# Patient Record
Sex: Male | Born: 1945 | Race: Black or African American | Hispanic: No | Smoking: Never smoker
Health system: Southern US, Community
[De-identification: ages and names within clinical notes are randomized; demographics above are authoritative.]

## PROBLEM LIST (undated history)

## (undated) ENCOUNTER — Emergency Department: Admission: EM | Payer: Medicare HMO | Source: Home / Self Care

## (undated) DIAGNOSIS — I509 Heart failure, unspecified: Secondary | ICD-10-CM

## (undated) DIAGNOSIS — M1990 Unspecified osteoarthritis, unspecified site: Secondary | ICD-10-CM

## (undated) DIAGNOSIS — I209 Angina pectoris, unspecified: Secondary | ICD-10-CM

## (undated) DIAGNOSIS — Z9289 Personal history of other medical treatment: Secondary | ICD-10-CM

## (undated) DIAGNOSIS — R011 Cardiac murmur, unspecified: Secondary | ICD-10-CM

## (undated) DIAGNOSIS — I700 Atherosclerosis of aorta: Secondary | ICD-10-CM

## (undated) DIAGNOSIS — I10 Essential (primary) hypertension: Secondary | ICD-10-CM

## (undated) DIAGNOSIS — Z972 Presence of dental prosthetic device (complete) (partial): Secondary | ICD-10-CM

## (undated) DIAGNOSIS — K219 Gastro-esophageal reflux disease without esophagitis: Secondary | ICD-10-CM

## (undated) DIAGNOSIS — R0989 Other specified symptoms and signs involving the circulatory and respiratory systems: Secondary | ICD-10-CM

## (undated) DIAGNOSIS — I5030 Unspecified diastolic (congestive) heart failure: Secondary | ICD-10-CM

## (undated) DIAGNOSIS — E7800 Pure hypercholesterolemia, unspecified: Secondary | ICD-10-CM

## (undated) DIAGNOSIS — I2510 Atherosclerotic heart disease of native coronary artery without angina pectoris: Secondary | ICD-10-CM

## (undated) HISTORY — DX: Heart failure, unspecified: I50.9

## (undated) HISTORY — DX: Personal history of other medical treatment: Z92.89

---

## 2008-12-28 ENCOUNTER — Emergency Department: Payer: Self-pay | Admitting: Emergency Medicine

## 2015-08-16 ENCOUNTER — Emergency Department
Admission: EM | Admit: 2015-08-16 | Discharge: 2015-08-16 | Disposition: A | Discharge status: Home / Self Care | Payer: Medicare HMO | Attending: Emergency Medicine | Admitting: Emergency Medicine

## 2015-08-16 DIAGNOSIS — X58XXXA Exposure to other specified factors, initial encounter: Secondary | ICD-10-CM | POA: Diagnosis not present

## 2015-08-16 DIAGNOSIS — S3992XA Unspecified injury of lower back, initial encounter: Secondary | ICD-10-CM | POA: Diagnosis present

## 2015-08-16 DIAGNOSIS — Y9389 Activity, other specified: Secondary | ICD-10-CM | POA: Insufficient documentation

## 2015-08-16 DIAGNOSIS — S39012A Strain of muscle, fascia and tendon of lower back, initial encounter: Secondary | ICD-10-CM | POA: Insufficient documentation

## 2015-08-16 DIAGNOSIS — Y9289 Other specified places as the place of occurrence of the external cause: Secondary | ICD-10-CM | POA: Diagnosis not present

## 2015-08-16 DIAGNOSIS — Y990 Civilian activity done for income or pay: Secondary | ICD-10-CM | POA: Insufficient documentation

## 2015-08-16 MED ORDER — DIAZEPAM 2 MG PO TABS: 2.0000 mg | ORAL_TABLET | Freq: Three times a day (TID) | ORAL | Status: DC | PRN

## 2015-08-16 MED ORDER — NAPROXEN 500 MG PO TBEC: 500.0000 mg | DELAYED_RELEASE_TABLET | Freq: Two times a day (BID) | ORAL | Status: DC

## 2015-08-16 NOTE — ED Provider Notes (Signed)
[facility] Emergency Department Provider Note ____________________________________________  Time seen: 1620  I have reviewed the triage vital signs and the nursing notes.  HISTORY  Chief Complaint  Hip Pain  HPI Dale Nabor. is a 70 y.o. male presents to the ED for evaluation of pain to his right buttocks and hip for the last 2 weeks. He describes exact moment of onset was while he was working out at the [employer]. He describes he was doing weight work on the seated back flexion machine. He describes he had just done 100 reps at 150 pounds, and was about through with the next 50 reps.He noted an immediate twinge to the right low back at the buttocks. He then took a week off of working out, at the advice of one of the trainers there. In that same week he is dosed Aleve at least a couple of times and has applied some heat to the low back area. He does report some improvement in his symptoms but assumed it with a simple muscle strain his symptoms would've resolved by now. He notes pain to the left buttock buttocks that seems to be worse with prolonged standing. He denies any lotion a referral, foot drop, or distal paresthesias. He also denies any interim fall, or accident related to it. He reports his overall discomfort 6/10 in triage.  No past medical history on file.  There are no active problems to display for this patient.  No past surgical history on file.  Current Outpatient Rx  Name  Route  Sig  Dispense  Refill  . diazepam (VALIUM) 2 MG tablet   Oral   Take 1 tablet (2 mg total) by mouth every 8 (eight) hours as needed for muscle spasms.   10 tablet   0   . naproxen (EC NAPROSYN) 500 MG EC tablet   Oral   Take 1 tablet (500 mg total) by mouth 2 (two) times daily with a meal.   30 tablet   0    Allergies Review of patient's allergies indicates no known allergies.  No family history on file.  Social History Social History  Substance Use  Topics  . Smoking status: Not on file  . Smokeless tobacco: Not on file  . Alcohol Use: Not on file   Review of Systems  Constitutional: Negative for fever. Cardiovascular: Negative for chest pain. Respiratory: Negative for shortness of breath. Gastrointestinal: Negative for abdominal pain, vomiting and diarrhea. Genitourinary: Negative for dysuria. Musculoskeletal: Negative for back pain. Right buttocks pain as above. Skin: Negative for rash. Neurological: Negative for headaches, focal weakness or numbness. ____________________________________________  PHYSICAL EXAM:  VITAL SIGNS: ED Triage Vitals  Enc Vitals Group     BP 08/16/15 1503 170/87 mmHg     Pulse Rate 08/16/15 1503 84     Resp 08/16/15 1503 20     Temp 08/16/15 1503 98.2 F (36.8 C)     Temp Source 08/16/15 1503 Oral     SpO2 08/16/15 1503 100 %     Weight 08/16/15 1503 230 lb (104.327 kg)     Height 08/16/15 1503 6' (1.829 m)     Head Cir --      Peak Flow --      Pain Score 08/16/15 1505 6     Pain Loc --      Pain Edu? --      Excl. in GC? --    Constitutional: Alert and oriented. Well appearing and in  no distress. Head: Normocephalic and atraumatic.      Eyes: Conjunctivae are normal. PERRL. Normal extraocular movements      Neck: Supple. No thyromegaly. Hematological/Lymphatic/Immunological: No cervical lymphadenopathy. Cardiovascular: Normal rate, regular rhythm.  Respiratory: Normal respiratory effort. No wheezes/rales/rhonchi. Gastrointestinal: Soft and nontender. No distention. Musculoskeletal: Patient with normal spinal alignment without midline tenderness, spasm, deformity, or step-off. He is with minimal palpation to the musculature over the right iliac crest and right gluteal region. There is no groin pain or hip pointer tenderness. Normal right knee exam distally. Patient with full flexion and extension range of the right hip. He is also noted to have fluid internal and external rotation. He is  found to have a negative straight leg raise on exam. Nontender with normal range of motion in all extremities.  Neurologic:  Cranial nerves II through XII grossly intact. Normal LE DTRs bilaterally. Normal toe dorsiflexion and foot eversion on exam. Normal gait without ataxia. Normal speech and language. No gross focal neurologic deficits are appreciated. Skin:  Skin is warm, dry and intact. No rash noted. Psychiatric: Mood and affect are normal. Patient exhibits appropriate insight and judgment. ____________________________________________  INITIAL IMPRESSION / ASSESSMENT AND PLAN / ED COURSE  Patient with what appears to be a persistent right lumbar sacral strain secondary to mechanical injury. He is to continue with ice and heat therapy and dose the prescription anti-inflammatory and muscle relaxant, as directed. He is discharged with a prescription for EC Naprosyn and Valium to dose as directed. He will follow with his primary care provider for further management of symptoms. ____________________________________________  FINAL CLINICAL IMPRESSION(S) / ED DIAGNOSES  Final diagnoses:  Lumbosacral strain, initial encounter      Lissa Hoard, PA-C 08/16/15 1820  Charlesetta Ivory [place], PA-C 08/16/15 1821  Phineas Semen, MD 08/16/15 2017

## 2015-08-16 NOTE — Discharge Instructions (Signed)
Back Exercises °The following exercises strengthen the muscles that help to support the back. They also help to keep the lower back flexible. Doing these exercises can help to prevent back pain or lessen existing pain. °If you have back pain or discomfort, try doing these exercises 2-3 times each day or as told by your health care provider. When the pain goes away, do them once each day, but increase the number of times that you repeat the steps for each exercise (do more repetitions). If you do not have back pain or discomfort, do these exercises once each day or as told by your health care provider. °EXERCISES °Single Knee to Chest °Repeat these steps 3-5 times for each leg: °1. Lie on your back on a firm bed or the floor with your legs extended. °2. Bring one knee to your chest. Your other leg should stay extended and in contact with the floor. °3. Hold your knee in place by grabbing your knee or thigh. °4. Pull on your knee until you feel a gentle stretch in your lower back. °5. Hold the stretch for 10-30 seconds. °6. Slowly release and straighten your leg. °Pelvic Tilt °Repeat these steps 5-10 times: °1. Lie on your back on a firm bed or the floor with your legs extended. °2. Bend your knees so they are pointing toward the ceiling and your feet are flat on the floor. °3. Tighten your lower abdominal muscles to press your lower back against the floor. This motion will tilt your pelvis so your tailbone points up toward the ceiling instead of pointing to your feet or the floor. °4. With gentle tension and even breathing, hold this position for 5-10 seconds. °Cat-Cow °Repeat these steps until your lower back becomes more flexible: °1. Get into a hands-and-knees position on a firm surface. Keep your hands under your shoulders, and keep your knees under your hips. You may place padding under your knees for comfort. °2. Let your head hang down, and point your tailbone toward the floor so your lower back becomes  rounded like the back of a cat. °3. Hold this position for 5 seconds. °4. Slowly lift your head and point your tailbone up toward the ceiling so your back forms a sagging arch like the back of a cow. °5. Hold this position for 5 seconds. °Press-Ups °Repeat these steps 5-10 times: °1. Lie on your abdomen (face-down) on the floor. °2. Place your palms near your head, about shoulder-width apart. °3. While you keep your back as relaxed as possible and keep your hips on the floor, slowly straighten your arms to raise the top half of your body and lift your shoulders. Do not use your back muscles to raise your upper torso. You may adjust the placement of your hands to make yourself more comfortable. °4. Hold this position for 5 seconds while you keep your back relaxed. °5. Slowly return to lying flat on the floor. °Bridges °Repeat these steps 10 times: °1. Lie on your back on a firm surface. °2. Bend your knees so they are pointing toward the ceiling and your feet are flat on the floor. °3. Tighten your buttocks muscles and lift your buttocks off of the floor until your waist is at almost the same height as your knees. You should feel the muscles working in your buttocks and the back of your thighs. If you do not feel these muscles, slide your feet 1-2 inches farther away from your buttocks. °4. Hold this position for 3-5   seconds. 5. Slowly lower your hips to the starting position, and allow your buttocks muscles to relax completely. If this exercise is too easy, try doing it with your arms crossed over your chest. Abdominal Crunches Repeat these steps 5-10 times: 1. Lie on your back on a firm bed or the floor with your legs extended. 2. Bend your knees so they are pointing toward the ceiling and your feet are flat on the floor. 3. Cross your arms over your chest. 4. Tip your chin slightly toward your chest without bending your neck. 5. Tighten your abdominal muscles and slowly raise your trunk (torso) high  enough to lift your shoulder blades a tiny bit off of the floor. Avoid raising your torso higher than that, because it can put too much stress on your low back and it does not help to strengthen your abdominal muscles. 6. Slowly return to your starting position. Back Lifts Repeat these steps 5-10 times: 1. Lie on your abdomen (face-down) with your arms at your sides, and rest your forehead on the floor. 2. Tighten the muscles in your legs and your buttocks. 3. Slowly lift your chest off of the floor while you keep your hips pressed to the floor. Keep the back of your head in line with the curve in your back. Your eyes should be looking at the floor. 4. Hold this position for 3-5 seconds. 5. Slowly return to your starting position. SEEK MEDICAL CARE IF:  Your back pain or discomfort gets much worse when you do an exercise.  Your back pain or discomfort does not lessen within 2 hours after you exercise. If you have any of these problems, stop doing these exercises right away. Do not do them again unless your health care provider says that you can. SEEK IMMEDIATE MEDICAL CARE IF:  You develop sudden, severe back pain. If this happens, stop doing the exercises right away. Do not do them again unless your health care provider says that you can.   This information is not intended to replace advice given to you by your health care provider. Make sure you discuss any questions you have with your health care provider.   Document Released: 07/21/2004 Document Revised: 03/04/2015 Document Reviewed: 08/07/2014 Elsevier Interactive Patient Education [employer]   Take the prescription meds as directed. Follow-up with Dr. Maryellen Pile for ongoing symptoms.

## 2015-08-16 NOTE — ED Notes (Signed)
Pt states after working out in the gym 1 week ago pain to right hip. Pain made worse after standing for a long time.

## 2016-09-14 ENCOUNTER — Encounter: Payer: Self-pay | Admitting: *Deleted

## 2016-09-15 ENCOUNTER — Encounter: Payer: Self-pay | Admitting: *Deleted

## 2016-09-21 ENCOUNTER — Encounter: Payer: Self-pay | Admitting: General Surgery

## 2016-09-21 ENCOUNTER — Ambulatory Visit (INDEPENDENT_AMBULATORY_CARE_PROVIDER_SITE_OTHER): Payer: Medicare PPO | Admitting: General Surgery

## 2016-09-21 VITALS — BP 152/78 | HR 80 | Resp 12 | Ht 72.0 in | Wt 212.0 lb

## 2016-09-21 DIAGNOSIS — Z1211 Encounter for screening for malignant neoplasm of colon: Secondary | ICD-10-CM

## 2016-09-21 MED ORDER — POLYETHYLENE GLYCOL 3350 17 GM/SCOOP PO POWD: 1.0000 | Freq: Once | ORAL | 0 refills | Status: AC

## 2016-09-21 NOTE — Progress Notes (Signed)
Patient ID: Dale DollarOtis Wilbert Keeler Jr., male   DOB: 11/24/1945, 71 y.o.   MRN: [id number]  Chief Complaint  Patient presents with  . Colonoscopy    HPI Dale DollarOtis Wilbert Flam Jr. is a 71 y.o. male here today for a evaluation of a screening colonoscopy. Patient states no GI problems at this time. Moves his bowels daily.  HPI  Past Medical History:  Diagnosis Date  . Hypertension     No past surgical history on file.  No family history on file.  Social History Social History  Substance Use Topics  . Smoking status: Never Smoker  . Smokeless tobacco: Never Used  . Alcohol use No    No Known Allergies  Current Outpatient Prescriptions  Medication Sig Dispense Refill  . diazepam (VALIUM) 2 MG tablet Take 1 tablet (2 mg total) by mouth every 8 (eight) hours as needed for muscle spasms. 10 tablet 0  . moexipril (UNIVASC) 7.5 MG tablet Take 7.5 mg by mouth at bedtime.    . polyethylene glycol powder (GLYCOLAX/MIRALAX) powder Take 255 g by mouth once. 255 grams one bottle for colonoscopy prep 255 g 0   No current facility-administered medications for this visit.     Review of Systems Review of Systems  Constitutional: Negative.   Respiratory: Negative.   Cardiovascular: Negative.   Gastrointestinal: Negative.     Blood pressure (!) 152/78, pulse 80, resp. rate 12, height 6' (1.829 m), weight 212 lb (96.2 kg).  Physical Exam Physical Exam  Constitutional: He appears well-developed.  Neck: Thyromegaly present.  Cardiovascular: Normal rate and regular rhythm.   Murmur heard.  Systolic murmur is present with a grade of 2/6  Patient reports no exercise intolerance. No shortness of breath or PND.  Pulmonary/Chest: Effort normal and breath sounds normal.       Assessment    Candidate for screening colonoscopy    Plan       Colonoscopy with possible biopsy/polypectomy prn: Information regarding the procedure, including its potential risks and complications (including but  not limited to perforation of the bowel, which may require emergency surgery to repair, and bleeding) was verbally given to the patient. Educational information regarding lower intestinal endoscopy was given to the patient. Written instructions for how to complete the bowel prep using Miralax were provided. The importance of drinking ample fluids to avoid dehydration as a result of the prep emphasized.  Patient has been scheduled for a colon[facility] 11[facility]09-16 [facility].  This information has been scribed by Ples SpecterJessica Qualls CMA.   Earline MayotteByrnett, Barbarita Hutmacher W 09/21/2016, 9:08 PM

## 2016-09-21 NOTE — Patient Instructions (Signed)
Colonoscopy, Adult A colonoscopy is an exam to look at the entire large intestine. During the exam, a lubricated, bendable tube is inserted into the anus and then passed into the rectum, colon, and other parts of the large intestine. A colonoscopy is often done as a part of normal colorectal screening or in response to certain symptoms, such as anemia, persistent diarrhea, abdominal pain, and blood in the stool. The exam can help screen for and diagnose medical problems, including:  Tumors.  Polyps.  Inflammation.  Areas of bleeding. Tell a health care provider about:  Any allergies you have.  All medicines you are taking, including vitamins, herbs, eye drops, creams, and over-the-counter medicines.  Any problems you or family members have had with anesthetic medicines.  Any blood disorders you have.  Any surgeries you have had.  Any medical conditions you have.  Any problems you have had passing stool. What are the risks? Generally, this is a safe procedure. However, problems may occur, including:  Bleeding.  A tear in the intestine.  A reaction to medicines given during the exam.  Infection (rare). What happens before the procedure? Eating and drinking restrictions  Follow instructions from your health care provider about eating and drinking, which may include:  A few days before the procedure - follow a low-fiber diet. Avoid nuts, seeds, dried fruit, raw fruits, and vegetables.  1-3 days before the procedure - follow a clear liquid diet. Drink only clear liquids, such as clear broth or bouillon, black coffee or tea, clear juice, clear soft drinks or sports drinks, gelatin dessert, and popsicles. Avoid any liquids that contain red or purple dye.  On the day of the procedure - do not eat or drink anything during the 2 hours before the procedure, or within the time period that your health care provider recommends. Bowel prep  If you were prescribed an oral bowel prep to  clean out your colon:  Take it as told by your health care provider. Starting the day before your procedure, you will need to drink a large amount of medicated liquid. The liquid will cause you to have multiple loose stools until your stool is almost clear or light green.  If your skin or anus gets irritated from diarrhea, you may use these to relieve the irritation:  Medicated wipes, such as adult wet wipes with aloe and vitamin E.  A skin soothing-product like petroleum jelly.  If you vomit while drinking the bowel prep, take a break for up to 60 minutes and then begin the bowel prep again. If vomiting continues and you cannot take the bowel prep without vomiting, call your health care provider. General instructions   Ask your health care provider about changing or stopping your regular medicines. This is especially important if you are taking diabetes medicines or blood thinners.  Plan to have someone take you home from the hospital or clinic. What happens during the procedure?  An IV tube may be inserted into one of your veins.  You will be given medicine to help you relax (sedative).  To reduce your risk of infection:  Your health care team will wash or sanitize their hands.  Your anal area will be washed with soap.  You will be asked to lie on your side with your knees bent.  Your health care provider will lubricate a long, thin, flexible tube. The tube will have a camera and a light on the end.  The tube will be inserted into your anus.    The tube will be gently eased through your rectum and colon.  Air will be delivered into your colon to keep it open. You may feel some pressure or cramping.  The camera will be used to take images during the procedure.  A small tissue sample may be removed from your body to be examined under a microscope (biopsy). If any potential problems are found, the tissue will be sent to a lab for testing.  If small polyps are found, your health  care provider may remove them and have them checked for cancer cells.  The tube that was inserted into your anus will be slowly removed. The procedure may vary among health care providers and hospitals. What happens after the procedure?  Your blood pressure, heart rate, breathing rate, and blood oxygen level will be monitored until the medicines you were given have worn off.  Do not drive for 24 hours after the exam.  You may have a small amount of blood in your stool.  You may pass gas and have mild abdominal cramping or bloating due to the air that was used to inflate your colon during the exam.  It is up to you to get the results of your procedure. Ask your health care provider, or the department performing the procedure, when your results will be ready. This information is not intended to replace advice given to you by your health care provider. Make sure you discuss any questions you have with your health care provider. Document Released: 06/10/2000 Document Revised: 04/13/2016 Document Reviewed: 08/25/2015 Elsevier Interactive Patient Education  2017 Elsevier Inc.  

## 2016-11-02 ENCOUNTER — Telehealth: Payer: Self-pay | Admitting: *Deleted

## 2016-11-02 NOTE — Telephone Encounter (Signed)
Message for patient to call the office.   Patient is scheduled for a [facility] with Dr. Lemar LivingsByrnett.   We need to make sure patient has not had a change in medications since his last office visit. Also, need to make sure that he has not had a change in his health and has picked up Miralax prescription.

## 2016-11-02 NOTE — Telephone Encounter (Signed)
Patient called the office back to report that he has not had a change in his medications since his last office visit.   He states he has yet to pick up his Miralax prescription but was instructed to do so.   We will proceed with colonoscopy as scheduled.   Patient instructed to call the office should he have further questions.

## 2016-11-09 ENCOUNTER — Ambulatory Visit
Admission: RE | Admit: 2016-11-09 | Discharge: 2016-11-09 | Disposition: A | Discharge status: Home / Self Care | Payer: Medicare PPO | Source: Ambulatory Visit | Attending: General Surgery | Admitting: General Surgery

## 2016-11-09 ENCOUNTER — Ambulatory Visit: Payer: Medicare PPO | Admitting: Anesthesiology

## 2016-11-09 ENCOUNTER — Encounter
Admission: RE | Disposition: A | Discharge status: Home / Self Care | Payer: Self-pay | Source: Ambulatory Visit | Attending: General Surgery

## 2016-11-09 ENCOUNTER — Encounter: Payer: Self-pay | Admitting: *Deleted

## 2016-11-09 DIAGNOSIS — K621 Rectal polyp: Secondary | ICD-10-CM | POA: Insufficient documentation

## 2016-11-09 DIAGNOSIS — Z1211 Encounter for screening for malignant neoplasm of colon: Secondary | ICD-10-CM | POA: Diagnosis not present

## 2016-11-09 DIAGNOSIS — I10 Essential (primary) hypertension: Secondary | ICD-10-CM | POA: Diagnosis not present

## 2016-11-09 DIAGNOSIS — Z79899 Other long term (current) drug therapy: Secondary | ICD-10-CM | POA: Insufficient documentation

## 2016-11-09 HISTORY — PX: COLONOSCOPY WITH PROPOFOL: SHX5780

## 2016-11-09 SURGERY — COLONOSCOPY WITH PROPOFOL
Anesthesia: General

## 2016-11-09 MED ORDER — PROPOFOL 500 MG/50ML IV EMUL
INTRAVENOUS | Status: AC
  Filled 2016-11-09: qty 50

## 2016-11-09 MED ORDER — SODIUM CHLORIDE 0.9 % IV SOLN
INTRAVENOUS | Status: DC
  Administered 2016-11-09: 09:00:00 via INTRAVENOUS

## 2016-11-09 MED ORDER — PROPOFOL 500 MG/50ML IV EMUL
INTRAVENOUS | Status: DC | PRN
  Administered 2016-11-09: 150 ug/kg/min via INTRAVENOUS

## 2016-11-09 MED ORDER — PROPOFOL 10 MG/ML IV BOLUS
INTRAVENOUS | Status: DC | PRN
  Administered 2016-11-09: 80 mg via INTRAVENOUS

## 2016-11-09 NOTE — Anesthesia Post-op Follow-up Note (Cosign Needed)
Anesthesia QCDR form completed.        

## 2016-11-09 NOTE — Anesthesia Preprocedure Evaluation (Signed)
Anesthesia Evaluation  Patient identified by MRN, date of birth, ID band Patient awake    Reviewed: Allergy & Precautions, NPO status , Patient's Chart, lab work & pertinent test results  History of Anesthesia Complications Negative for: history of anesthetic complications  Airway Mallampati: III       Dental  (+) Upper Dentures   Pulmonary neg pulmonary ROS,           Cardiovascular hypertension, Pt. on medications      Neuro/Psych negative neurological ROS     GI/Hepatic negative GI ROS, Neg liver ROS,   Endo/Other  negative endocrine ROS  Renal/GU negative Renal ROS     Musculoskeletal   Abdominal   Peds  Hematology negative hematology ROS (+)   Anesthesia Other Findings   Reproductive/Obstetrics                             Anesthesia Physical Anesthesia Plan  ASA: II  Anesthesia Plan: General   Post-op Pain Management:    Induction: Intravenous  Airway Management Planned: Nasal Cannula  Additional Equipment:   Intra-op Plan:   Post-operative Plan:   Informed Consent: I have reviewed the patients History and Physical, chart, labs and discussed the procedure including the risks, benefits and alternatives for the proposed anesthesia with the patient or authorized representative who has indicated his/her understanding and acceptance.     Plan Discussed with:   Anesthesia Plan Comments:         Anesthesia Quick Evaluation

## 2016-11-09 NOTE — Transfer of Care (Signed)
Immediate Anesthesia Transfer of Care Note  Patient: Dale DollarOtis Wilbert Brooks Jr.  Procedure(s) Performed: Procedure(s): COLONOSCOPY WITH PROPOFOL (N/A)  Patient Location: PACU  Anesthesia Type:General  Level of Consciousness: sedated  Airway & Oxygen Therapy: Patient Spontanous Breathing and Patient connected to nasal cannula oxygen  Post-op Assessment: Report given to RN and Post -op Vital signs reviewed and stable  Post vital signs: Reviewed and stable  Last Vitals:  Vitals:   11/09/16 0834  BP: (!) 161/88  Pulse: 73  Resp: 16  Temp: (!) 35.8 C    Last Pain:  Vitals:   11/09/16 0834  TempSrc: Tympanic         Complications: No apparent anesthesia complications

## 2016-11-09 NOTE — Anesthesia Procedure Notes (Signed)
Performed by: Isabela Nardelli Pre-anesthesia Checklist: Patient identified, Emergency Drugs available, Suction available, Patient being monitored and Timeout performed Patient Re-evaluated:Patient Re-evaluated prior to inductionOxygen Delivery Method: Nasal cannula Preoxygenation: Pre-oxygenation with 100% oxygen Intubation Type: IV induction       

## 2016-11-09 NOTE — Anesthesia Postprocedure Evaluation (Signed)
Anesthesia Post Note  Patient: Dale DollarOtis Wilbert Senkbeil Jr.  Procedure(s) Performed: Procedure(s) (LRB): COLONOSCOPY WITH PROPOFOL (N/A)  Patient location during evaluation: Endoscopy Anesthesia Type: General Level of consciousness: awake and alert Pain management: pain level controlled Vital Signs Assessment: post-procedure vital signs reviewed and stable Respiratory status: spontaneous breathing and respiratory function stable Cardiovascular status: stable Anesthetic complications: no     Last Vitals:  Vitals:   11/09/16 0834 11/09/16 0957  BP: (!) 161/88 112/70  Pulse: 73 71  Resp: 16 13  Temp: (!) 35.8 C     Last Pain:  Vitals:   11/09/16 0834  TempSrc: Tympanic                 KEPHART,WILLIAM K

## 2016-11-09 NOTE — H&P (Signed)
Dale GrammesOtis Wilbert Joneen BoersRankin Jr. [id number] 02/28/1946     HPI: Healthy 71  y/o male for screening colonoscopy. Tolerated prep well.   Prescriptions Prior to Admission  Medication Sig Dispense Refill Last Dose  . diltiazem (TIAZAC) 300 MG 24 hr capsule Take 300 mg by mouth daily.   11/09/2016 at 0600  . fexofenadine (ALLEGRA) 180 MG tablet Take 180 mg by mouth daily as needed for allergies or rhinitis.     Marland Kitchen. moexipril (UNIVASC) 7.5 MG tablet Take 7.5 mg by mouth at bedtime.   11/09/2016 at 0600  . naproxen sodium (ANAPROX) 220 MG tablet Take 220 mg by mouth 2 (two) times daily as needed.      No Known Allergies Past Medical History:  Diagnosis Date  . Hypertension    History reviewed. No pertinent surgical history. Social History   Social History  . Marital status: Divorced    Spouse name: N/A  . Number of children: N/A  . Years of education: N/A   Occupational History  . Not on file.   Social History Main Topics  . Smoking status: Never Smoker  . Smokeless tobacco: Never Used  . Alcohol use No  . Drug use: No  . Sexual activity: Not on file   Other Topics Concern  . Not on file   Social History Narrative  . No narrative on file   Social History   Social History Narrative  . No narrative on file     ROS: Negative.     PE: HEENT: Negative. Lungs: Clear. Cardio: RR.   Assessment/Plan:  Proceed with planned endoscopy.  Earline MayotteByrnett, Jermiya Reichl W 11/09/2016   Assessment/Plan:  Proceed with planned endoscopy.

## 2016-11-09 NOTE — Op Note (Signed)
[facility] Patient Name: Dale Cook Procedure Date: 11/09/2016 9:22 AM MRN: [id number] Account #: [id number] Date of Birth: Oct 29, 1945 Admit Type: Outpatient Age: 71 Room: [facility] ENDO ROOM 1 Gender: Male Note Status: Finalized Procedure:            Colonoscopy Indications:          Screening for colorectal malignant neoplasm Providers:            Earline Mayotte, MD Referring MD:         Serita Sheller. Maryellen Pile MD, MD (Referring MD) Medicines:            Monitored Anesthesia Care Complications:        No immediate complications. Procedure:            Pre-Anesthesia Assessment:                       - Prior to the procedure, a History and Physical was                        performed, and patient medications, allergies and                        sensitivities were reviewed. The patient's tolerance of                        previous anesthesia was reviewed.                       - The risks and benefits of the procedure and the                        sedation options and risks were discussed with the                        patient. All questions were answered and informed                        consent was obtained.                       After obtaining informed consent, the colonoscope was                        passed under direct vision. Throughout the procedure,                        the patient's blood pressure, pulse, and oxygen                        saturations were monitored continuously. The                        Colonoscope was introduced through the anus and                        advanced to the the cecum, identified by appendiceal                        orifice and ileocecal valve. The colonoscopy was  performed without difficulty. The patient tolerated the                        procedure well. The quality of the bowel preparation                        was excellent. Findings:      A 5 mm polyp was found in the  rectum. The polyp was sessile. This was       biopsied with a cold forceps for histology.      The retroflexed view of the distal rectum and anal verge was normal and       showed no anal or rectal abnormalities. Impression:           - One 5 mm polyp in the rectum. Biopsied.                       - The distal rectum and anal verge are normal on                        retroflexion view. Recommendation:       - Telephone endoscopist for pathology results in 1 week. Procedure Code(s):    --- Professional ---                       [phone], Colonoscopy, flexible; with biopsy, single or                        multiple Diagnosis Code(s):    --- Professional ---                       Z12.11, Encounter for screening for malignant neoplasm                        of colon                       K62.1, Rectal polyp CPT copyright 2016 American Medical Association. All rights reserved. The codes documented in this report are preliminary and upon coder review may  be revised to meet current compliance requirements. Earline MayotteJeffrey W. Ciria Bernardini, MD 11/09/2016 9:54:17 AM This report has been signed electronically. Number of Addenda: 0 Note Initiated On: 11/09/2016 9:22 AM Scope Withdrawal Time: 0 hours 8 minutes 43 seconds  Total Procedure Duration: 0 hours 25 minutes 35 seconds       [facility]

## 2016-11-10 ENCOUNTER — Encounter: Payer: Self-pay | Admitting: General Surgery

## 2016-11-10 ENCOUNTER — Telehealth: Payer: Self-pay | Admitting: General Surgery

## 2016-11-10 LAB — SURGICAL PATHOLOGY

## 2016-11-10 NOTE — Telephone Encounter (Signed)
Message left that biopsy was fine, but warrants a three year follow up.

## 2016-11-11 ENCOUNTER — Telehealth: Payer: Self-pay

## 2016-11-11 NOTE — Telephone Encounter (Signed)
error 

## 2017-01-05 ENCOUNTER — Ambulatory Visit (INDEPENDENT_AMBULATORY_CARE_PROVIDER_SITE_OTHER): Payer: Medicare PPO | Admitting: Cardiovascular Disease

## 2017-01-05 ENCOUNTER — Ambulatory Visit
Admission: RE | Admit: 2017-01-05 | Discharge: 2017-01-05 | Disposition: A | Discharge status: Home / Self Care | Payer: Medicare PPO | Source: Ambulatory Visit | Attending: Cardiovascular Disease | Admitting: Cardiovascular Disease

## 2017-01-05 ENCOUNTER — Encounter: Payer: Self-pay | Admitting: Cardiovascular Disease

## 2017-01-05 ENCOUNTER — Other Ambulatory Visit
Admission: RE | Admit: 2017-01-05 | Discharge: 2017-01-05 | Disposition: A | Discharge status: Home / Self Care | Payer: Medicare PPO | Source: Ambulatory Visit | Attending: Cardiovascular Disease | Admitting: Cardiovascular Disease

## 2017-01-05 VITALS — BP 150/78 | HR 85 | Ht 72.0 in | Wt 219.5 lb

## 2017-01-05 DIAGNOSIS — Z7689 Persons encountering health services in other specified circumstances: Secondary | ICD-10-CM

## 2017-01-05 DIAGNOSIS — E785 Hyperlipidemia, unspecified: Secondary | ICD-10-CM | POA: Diagnosis not present

## 2017-01-05 DIAGNOSIS — Z01818 Encounter for other preprocedural examination: Secondary | ICD-10-CM | POA: Insufficient documentation

## 2017-01-05 DIAGNOSIS — Z01812 Encounter for preprocedural laboratory examination: Secondary | ICD-10-CM

## 2017-01-05 DIAGNOSIS — R0989 Other specified symptoms and signs involving the circulatory and respiratory systems: Secondary | ICD-10-CM | POA: Diagnosis not present

## 2017-01-05 DIAGNOSIS — I200 Unstable angina: Secondary | ICD-10-CM

## 2017-01-05 LAB — BASIC METABOLIC PANEL
Anion gap: 10 (ref 5–15)
BUN: 32 mg/dL — ABNORMAL HIGH (ref 6–20)
CALCIUM: 9.4 mg/dL (ref 8.9–10.3)
CHLORIDE: 102 mmol/L (ref 101–111)
CO2: 25 mmol/L (ref 22–32)
CREATININE: 1.41 mg/dL — AB (ref 0.61–1.24)
GFR calc non Af Amer: 50 mL/min — ABNORMAL LOW (ref >60)
GFR, EST AFRICAN AMERICAN: 58 mL/min — AB (ref >60)
Glucose, Bld: 142 mg/dL — ABNORMAL HIGH (ref 65–99)
Potassium: 4.6 mmol/L (ref 3.5–5.1)
SODIUM: 137 mmol/L (ref 135–145)

## 2017-01-05 LAB — CBC
HCT: 50.2 % (ref 40.0–52.0)
Hemoglobin: 16.5 g/dL (ref 13.0–18.0)
MCH: 28.3 pg (ref 26.0–34.0)
MCHC: 32.9 g/dL (ref 32.0–36.0)
MCV: 85.9 fL (ref 80.0–100.0)
PLATELETS: 210 10*3/uL (ref 150–440)
RBC: 5.85 MIL/uL (ref 4.40–5.90)
RDW: 14.7 % — ABNORMAL HIGH (ref 11.5–14.5)
WBC: 21.4 10*3/uL — AB (ref 3.8–10.6)

## 2017-01-05 LAB — PROTIME-INR
INR: 1.03
PROTHROMBIN TIME: 13.5 s (ref 11.4–15.2)

## 2017-01-05 MED ORDER — ATORVASTATIN CALCIUM 40 MG PO TABS: 40.0000 mg | ORAL_TABLET | Freq: Every day | ORAL | 3 refills | Status: DC

## 2017-01-05 MED ORDER — ASPIRIN EC 81 MG PO TBEC: 81.0000 mg | DELAYED_RELEASE_TABLET | Freq: Every day | ORAL | 3 refills | Status: DC

## 2017-01-05 NOTE — Patient Instructions (Addendum)
Medication Instructions:  Your physician has recommended you make the following change in your medication:  START taking aspirin 81mg   Daily (over the counter) START taking atorvastatin 40mg  once daily   Labwork: BMET, CBC, PT/INR [facility] in the [facility]  Testing/Procedures: A chest x-ray takes a picture of the organs and structures inside the chest, including the heart, lungs, and blood vessels. This test can show several things, including, whether the heart is enlarges; whether fluid is building up in the lungs; and whether pacemaker / defibrillator leads are still in place.  Your physician has requested that you have a cardiac catheterization. Cardiac catheterization is used to diagnose and/or treat various heart conditions. Doctors may recommend this procedure for a number of different reasons. The most common reason is to evaluate chest pain. Chest pain can be a symptom of coronary artery disease (CAD), and cardiac catheterization can show whether plaque is narrowing or blocking your heart's arteries. This procedure is also used to evaluate the valves, as well as measure the blood flow and oxygen levels in different parts of your heart. For further information please visit [email or web address] Please follow instruction sheet, as given.  [facility] Cardiac Cath Instructions   You are scheduled for a Cardiac Cath on: Monday, July 16  Please arrive at 8:30am on the day of your procedure  Please expect a call from our [facility] to pre-register you  Do not eat/drink anything after midnight  Someone will need to drive you home  It is recommended someone be with you for the first 24 hours after your procedure  Wear clothes that are easy to get on/off and wear slip on shoes if possible   Medications bring a current list of all medications with you  __xx_ You may take all of your medications the morning of your procedure with enough water to swallow  safely  Day of your procedure: Arrive at the [facility] entrance.  Free valet service is available.  After entering the [facility] please check-in at the registration desk (1st desk on your right) to receive your armband. After receiving your armband someone will escort you to the cardiac cath/special procedures waiting area.  The usual length of stay after your procedure is about 2 to 3 hours.  This can vary.  If you have any questions, please call our office at [phone], or you may call the cardiac cath lab at [facility] directly at [phone]   Follow-Up: Your physician recommends that you schedule a follow-up appointment in: one month with Dr. Kirke Corin.    Any Other Special Instructions Will Be Listed Below (If Applicable).     If you need a refill on your cardiac medications before your next appointment, please call your pharmacy.   Angiogram An angiogram is an X-ray test. It is used to look at your blood vessels. For this test, a dye is put into the blood vessel being checked. The dye shows up on X-rays. It helps your doctor see if there is a blockage or other problem in the blood vessel. What happens before the procedure?  Follow your doctor's instructions about limiting what you eat or drink.  Ask your doctor if you may drink enough water to take any needed medicines the morning of the test.  Plan to have someone take you home after the test.  If you go home the same day as the test, plan to have someone stay with you for 24 hours. What happens during the  procedure?  An IV tube will be put into one of your veins.  You will be given a medicine that makes you relax (sedative).  Your skin will be washed where the thin tube (catheter) will be put in. Hair may be removed from this area. The tube may be put into: ? Your upper leg area (groin). ? The fold of your arm, near your elbow. ? Your wrist.  You will be given a medicine that numbs the area where the tube will be  inserted (local anesthetic).  The tube will be inserted into a blood vessel.  Using a type of X-ray (fluoroscopy) to see, your doctor will move the tube into the blood vessel to check it.  Dye will be put in through the tube. X-rays of your blood vessels will then be taken. Different doctors and hospitals may do this procedure differently. What happens after the procedure?  If the test is done through the leg, you will be kept in bed lying flat for several hours. You will be told to not bend or cross your legs.  The area where the tube was inserted will be checked often.  The pulse in your feet or wrist will be checked often.  More tests or X-rays may be done. This information is not intended to replace advice given to you by your health care provider. Make sure you discuss any questions you have with your health care provider. Document Released: 09/09/2008 Document Revised: 11/19/2015 Document Reviewed: 11/14/2012 Elsevier Interactive Patient Education  2017 Elsevier Inc.  Angiogram, Care After This sheet gives you information about how to care for yourself after your procedure. Your health care provider may also give you more specific instructions. If you have problems or questions, contact your health care provider. What can I expect after the procedure? After the procedure, it is common to have bruising and tenderness at the catheter insertion area. Follow these instructions at home: Insertion site care  Follow instructions from your health care provider about how to take care of your insertion site. Make sure you: ? Wash your hands with soap and water before you change your bandage (dressing). If soap and water are not available, use hand sanitizer. ? Change your dressing as told by your health care provider. ? Leave stitches (sutures), skin glue, or adhesive strips in place. These skin closures may need to stay in place for 2 weeks or longer. If adhesive strip edges start to  loosen and curl up, you may trim the loose edges. Do not remove adhesive strips completely unless your health care provider tells you to do that.  Do not take baths, swim, or use a hot tub until your health care provider approves.  You may shower 24-48 hours after the procedure or as told by your health care provider. ? Gently wash the site with plain soap and water. ? Pat the area dry with a clean towel. ? Do not rub the site. This may cause bleeding.  Do not apply powder or lotion to the site. Keep the site clean and dry.  Check your insertion site every day for signs of infection. Check for: ? Redness, swelling, or pain. ? Fluid or blood. ? Warmth. ? Pus or a bad smell. Activity  Rest as told by your health care provider, usually for 1-2 days.  Do not lift anything that is heavier than 10 lbs. (4.5 kg) or as told by your health care provider.  Do not drive for 24 hours if  you were given a medicine to help you relax (sedative).  Do not drive or use heavy machinery while taking prescription pain medicine. General instructions  Return to your normal activities as told by your health care provider, usually in about a week. Ask your health care provider what activities are safe for you.  If the catheter site starts bleeding, lie flat and put pressure on the site. If the bleeding does not stop, get help right away. This is a medical emergency.  Drink enough fluid to keep your urine clear or pale yellow. This helps flush the contrast dye from your body.  Take over-the-counter and prescription medicines only as told by your health care provider.  Keep all follow-up visits as told by your health care provider. This is important. Contact a health care provider if:  You have a fever or chills.  You have redness, swelling, or pain around your insertion site.  You have fluid or blood coming from your insertion site.  The insertion site feels warm to the touch.  You have pus or a  bad smell coming from your insertion site.  You have bruising around the insertion site.  You notice blood collecting in the tissue around the catheter site (hematoma). The hematoma may be painful to the touch. Get help right away if:  You have severe pain at the catheter insertion area.  The catheter insertion area swells very fast.  The catheter insertion area is bleeding, and the bleeding does not stop when you hold steady pressure on the area.  The area near or just beyond the catheter insertion site becomes pale, cool, tingly, or numb. These symptoms may represent a serious problem that is an emergency. Do not wait to see if the symptoms will go away. Get medical help right away. Call your local emergency services (911 in the [place] Do not drive yourself to the hospital. Summary  After the procedure, it is common to have bruising and tenderness at the catheter insertion area.  After the procedure, it is important to rest and drink plenty of fluids.  Do not take baths, swim, or use a hot tub until your health care provider says it is okay to do so. You may shower 24-48 hours after the procedure or as told by your health care provider.  If the catheter site starts bleeding, lie flat and put pressure on the site. If the bleeding does not stop, get help right away. This is a medical emergency. This information is not intended to replace advice given to you by your health care provider. Make sure you discuss any questions you have with your health care provider. Document Released: 12/30/2004 Document Revised: 05/18/2016 Document Reviewed: 05/18/2016 Elsevier Interactive Patient Education  2017 [employer].

## 2017-01-05 NOTE — Progress Notes (Signed)
Cardiology Office Note   Date:  01/05/2017   ID:  Dale Grammestis Wilbert Joneen BoersRankin Jr., DOB 08/12/1945, MRN [id number]  PCP:  Dale KaplanEason, Ernest B, MD  Cardiologist:   Dale BearsMuhammad Arida, MD   Chief Complaint  Patient presents with  . other    Per Dr. Maryellen PileEason c/o chest pain and elevated BP. Meds reviewed verbally with pt.      History of Present Illness: Dale DollarOtis Wilbert Sitzmann Jr. is a 71 y.o. male who Was referred by Dr. Maryellen PileEason for  urgent evaluation of chest pain. He has no prior cardiac history and has prolonged history of hypertension. He is not a smoker and has no family history of coronary artery disease. 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 Over the last month, he has experienced intermittent substernal chest tightness without radiation. This is mainly happening with physical activities and it started as brief in duration lasting for a few minutes but gradually has increased with prolonged episodes up to 10-15 minutes. Typically the discomfort resolves if he rest for about 5 or 10 minutes. This has been associated with shortness of breath but no orthopnea or PND. No similar symptoms and no previous cardiac evaluation.   Past Medical History:  Diagnosis Date  . Hypertension     Past Surgical History:  Procedure Laterality Date  . COLONOSCOPY WITH PROPOFOL N/A 11/09/2016   Procedure: COLONOSCOPY WITH PROPOFOL;  Surgeon: Earline MayotteByrnett, Jeffrey W, MD;  Location: [facility] ENDOSCOPY;  Service: Endoscopy;  Laterality: N/A;     Current Outpatient Prescriptions  Medication Sig Dispense Refill  . diltiazem (TIAZAC) 300 MG 24 hr capsule Take 300 mg by mouth daily.    . fexofenadine (ALLEGRA) 180 MG tablet Take 180 mg by mouth daily as needed for allergies or rhinitis.    Marland Kitchen. moexipril (UNIVASC) 7.5 MG tablet Take 7.5 mg by mouth at bedtime.    . naproxen sodium (ANAPROX) 220 MG tablet Take 220 mg by mouth 2 (two) times daily as needed.     No current facility-administered medications for this visit.      Allergies:   Patient has no known allergies.    Social History:  The patient  reports that he has never smoked. He has never used smokeless tobacco. He reports that he does not drink alcohol or use drugs.   Family History:  The patient's family history includes Heart attack in his mother.    ROS:  Please see the history of present illness.   Otherwise, review of systems are positive for none.   All other systems are reviewed and negative.    PHYSICAL EXAM: VS:  BP (!) 150/78 (BP Location: Right Arm, Patient Position: Sitting, Cuff Size: Normal)   Pulse 85   Ht 6' (1.829 m)   Wt 219 lb 8 oz (99.6 kg)   BMI 29.77 kg/m  , BMI Body mass index is 29.77 kg/m. GEN: Well nourished, well developed, in no acute distress  HEENT: normal  Neck: no JVD,  or masses. Left carotid bruit Cardiac: RRR; no  rubs, or gallops,no edema .  2/6 systolic ejection murmur in the aortic area which is early peaking Respiratory:  clear to auscultation bilaterally, normal work of breathing GI: soft, nontender, nondistended, + BS MS: no deformity or atrophy  Skin: warm and dry, no rash Neuro:  Strength and sensation are intact Psych: euthymic mood, full affect   EKG:  EKG is ordered today. The ekg ordered today demonstrates normal sinus rhythm with possible old inferior  infarct.   Recent Labs: No results found for requested labs within last 8760 hours.    Lipid Panel No results found for: CHOL, TRIG, HDL, CHOLHDL, VLDL, LDLCALC, LDLDIRECT    Wt Readings from Last 3 Encounters:  01/05/17 219 lb 8 oz (99.6 kg)  11/09/16 221 lb (100.2 kg)  09/21/16 212 lb (96.2 kg)      PAD Screen 01/05/2017  Previous PAD dx? No  Previous surgical procedure? No  Pain with walking? Yes  Subsides with rest? Yes  Feet/toe relief with dangling? No  Painful, non-healing ulcers? No  Extremities discolored? No      ASSESSMENT AND PLAN:  1.  Unstable angina: The patient's symptoms are highly worrisome for  unstable angina. I advised him to start taking aspirin 81 mg once daily and atorvastatin 40 mg once daily. I recommend proceeding with urgent cardiac catheterization and possible coronary intervention. I discussed risks, benefits and alternatives. I strongly advised him to call EMS if he develops chest pain at rest or non-resolving exertional chest pain.  2. Systolic murmur suggestive of aortic sclerosis or mild stenosis: This can be evaluated at same time of cardiac catheterization.  3. Left carotid bruit: I requested carotid Doppler.  4. Hyperlipidemia: I started atorvastatin 40 mg daily.    Disposition:   FU with me in 1 month  Signed,  Dale Bears, MD  01/05/2017 2:44 PM    [facility]

## 2017-01-06 ENCOUNTER — Other Ambulatory Visit: Payer: Self-pay

## 2017-01-06 DIAGNOSIS — R0989 Other specified symptoms and signs involving the circulatory and respiratory systems: Secondary | ICD-10-CM

## 2017-01-09 ENCOUNTER — Other Ambulatory Visit: Payer: Self-pay | Admitting: Nurse Practitioner

## 2017-01-09 ENCOUNTER — Inpatient Hospital Stay
Admission: AD | Admit: 2017-01-09 | Discharge: 2017-01-09 | DRG: 287 | Disposition: A | Discharge status: Other Acute Inpatient Hospital | Payer: Medicare PPO | Source: Ambulatory Visit | Attending: Cardiovascular Disease | Admitting: Cardiovascular Disease

## 2017-01-09 ENCOUNTER — Encounter (HOSPITAL_COMMUNITY): Payer: Self-pay | Admitting: General Practice

## 2017-01-09 ENCOUNTER — Encounter
Admission: AD | Disposition: A | Discharge status: Other Acute Inpatient Hospital | Payer: Self-pay | Source: Ambulatory Visit | Attending: Cardiovascular Disease

## 2017-01-09 ENCOUNTER — Encounter: Payer: Self-pay | Admitting: *Deleted

## 2017-01-09 ENCOUNTER — Inpatient Hospital Stay (HOSPITAL_COMMUNITY)
Admission: AD | Admit: 2017-01-09 | Discharge: 2017-01-17 | DRG: 236 | Disposition: A | Discharge status: Home Health | Payer: Medicare PPO | Source: Other Acute Inpatient Hospital | Attending: Thoracic Surgery (Cardiothoracic Vascular Surgery) | Admitting: Thoracic Surgery (Cardiothoracic Vascular Surgery)

## 2017-01-09 DIAGNOSIS — M19042 Primary osteoarthritis, left hand: Secondary | ICD-10-CM | POA: Diagnosis present

## 2017-01-09 DIAGNOSIS — I10 Essential (primary) hypertension: Secondary | ICD-10-CM | POA: Diagnosis present

## 2017-01-09 DIAGNOSIS — D62 Acute posthemorrhagic anemia: Secondary | ICD-10-CM | POA: Diagnosis not present

## 2017-01-09 DIAGNOSIS — I25110 Atherosclerotic heart disease of native coronary artery with unstable angina pectoris: Secondary | ICD-10-CM | POA: Diagnosis present

## 2017-01-09 DIAGNOSIS — Z79899 Other long term (current) drug therapy: Secondary | ICD-10-CM

## 2017-01-09 DIAGNOSIS — J9811 Atelectasis: Secondary | ICD-10-CM | POA: Diagnosis not present

## 2017-01-09 DIAGNOSIS — M19041 Primary osteoarthritis, right hand: Secondary | ICD-10-CM | POA: Diagnosis present

## 2017-01-09 DIAGNOSIS — I200 Unstable angina: Secondary | ICD-10-CM | POA: Diagnosis present

## 2017-01-09 DIAGNOSIS — I2510 Atherosclerotic heart disease of native coronary artery without angina pectoris: Secondary | ICD-10-CM

## 2017-01-09 DIAGNOSIS — R0789 Other chest pain: Secondary | ICD-10-CM | POA: Diagnosis not present

## 2017-01-09 DIAGNOSIS — E8770 Fluid overload, unspecified: Secondary | ICD-10-CM | POA: Diagnosis not present

## 2017-01-09 DIAGNOSIS — E785 Hyperlipidemia, unspecified: Secondary | ICD-10-CM | POA: Diagnosis present

## 2017-01-09 DIAGNOSIS — Z8249 Family history of ischemic heart disease and other diseases of the circulatory system: Secondary | ICD-10-CM

## 2017-01-09 DIAGNOSIS — M170 Bilateral primary osteoarthritis of knee: Secondary | ICD-10-CM | POA: Diagnosis present

## 2017-01-09 DIAGNOSIS — R7303 Prediabetes: Secondary | ICD-10-CM | POA: Diagnosis present

## 2017-01-09 DIAGNOSIS — I25119 Atherosclerotic heart disease of native coronary artery with unspecified angina pectoris: Secondary | ICD-10-CM | POA: Diagnosis present

## 2017-01-09 DIAGNOSIS — R011 Cardiac murmur, unspecified: Secondary | ICD-10-CM | POA: Diagnosis present

## 2017-01-09 DIAGNOSIS — Z951 Presence of aortocoronary bypass graft: Secondary | ICD-10-CM | POA: Diagnosis not present

## 2017-01-09 DIAGNOSIS — E782 Mixed hyperlipidemia: Secondary | ICD-10-CM | POA: Diagnosis present

## 2017-01-09 DIAGNOSIS — N184 Chronic kidney disease, stage 4 (severe): Secondary | ICD-10-CM | POA: Diagnosis not present

## 2017-01-09 DIAGNOSIS — Z01810 Encounter for preprocedural cardiovascular examination: Secondary | ICD-10-CM | POA: Diagnosis not present

## 2017-01-09 HISTORY — DX: Pure hypercholesterolemia, unspecified: E78.00

## 2017-01-09 HISTORY — DX: Atherosclerotic heart disease of native coronary artery without angina pectoris: I25.10

## 2017-01-09 HISTORY — DX: Essential (primary) hypertension: I10

## 2017-01-09 HISTORY — DX: Unspecified osteoarthritis, unspecified site: M19.90

## 2017-01-09 HISTORY — PX: LEFT HEART CATH AND CORONARY ANGIOGRAPHY: CATH118249

## 2017-01-09 HISTORY — PX: CARDIAC CATHETERIZATION: SHX172

## 2017-01-09 LAB — CBC
HCT: 49.4 % (ref 40.0–52.0)
HEMOGLOBIN: 16.7 g/dL (ref 13.0–18.0)
MCH: 28.9 pg (ref 26.0–34.0)
MCHC: 33.9 g/dL (ref 32.0–36.0)
MCV: 85.2 fL (ref 80.0–100.0)
Platelets: 209 10*3/uL (ref 150–440)
RBC: 5.79 MIL/uL (ref 4.40–5.90)
RDW: 14.4 % (ref 11.5–14.5)
WBC: 10.9 10*3/uL — AB (ref 3.8–10.6)

## 2017-01-09 LAB — APTT: APTT: 29 s (ref 24–36)

## 2017-01-09 LAB — PROTIME-INR
INR: 1.04
Prothrombin Time: 13.6 seconds (ref 11.4–15.2)

## 2017-01-09 SURGERY — LEFT HEART CATH AND CORONARY ANGIOGRAPHY
Anesthesia: Moderate Sedation | Laterality: Left

## 2017-01-09 MED ORDER — NITROGLYCERIN 0.4 MG SL SUBL: 0.4000 mg | SUBLINGUAL_TABLET | SUBLINGUAL | Status: DC | PRN

## 2017-01-09 MED ORDER — VERAPAMIL HCL 2.5 MG/ML IV SOLN
INTRAVENOUS | Status: DC | PRN
Start: 2017-01-09 — End: 2017-01-09
  Administered 2017-01-09: 2.5 mg via INTRA_ARTERIAL

## 2017-01-09 MED ORDER — ASPIRIN EC 81 MG PO TBEC
81.0000 mg | DELAYED_RELEASE_TABLET | Freq: Every day | ORAL | Status: DC
  Filled 2017-01-09: qty 1

## 2017-01-09 MED ORDER — SODIUM CHLORIDE 0.9% FLUSH: 3.0000 mL | Freq: Two times a day (BID) | INTRAVENOUS | Status: DC

## 2017-01-09 MED ORDER — ATORVASTATIN CALCIUM 20 MG PO TABS
40.0000 mg | ORAL_TABLET | Freq: Every day | ORAL | Status: DC
  Filled 2017-01-09: qty 1

## 2017-01-09 MED ORDER — SODIUM CHLORIDE 0.9 % IV SOLN: 250.0000 mL | INTRAVENOUS | Status: DC | PRN

## 2017-01-09 MED ORDER — IOPAMIDOL (ISOVUE-300) INJECTION 61%
INTRAVENOUS | Status: DC | PRN
  Administered 2017-01-09: 75 mL via INTRAVENOUS

## 2017-01-09 MED ORDER — FENTANYL CITRATE (PF) 100 MCG/2ML IJ SOLN
INTRAMUSCULAR | Status: DC | PRN
  Administered 2017-01-09: 25 ug via INTRAVENOUS

## 2017-01-09 MED ORDER — SODIUM CHLORIDE 0.9% FLUSH: 3.0000 mL | INTRAVENOUS | Status: DC | PRN

## 2017-01-09 MED ORDER — ONDANSETRON HCL 4 MG/2ML IJ SOLN: 4.0000 mg | Freq: Four times a day (QID) | INTRAMUSCULAR | Status: DC | PRN

## 2017-01-09 MED ORDER — HEPARIN SODIUM (PORCINE) 1000 UNIT/ML IJ SOLN
INTRAMUSCULAR | Status: DC | PRN
  Administered 2017-01-09: 5000 [IU] via INTRAVENOUS

## 2017-01-09 MED ORDER — METOPROLOL TARTRATE 50 MG PO TABS
50.0000 mg | ORAL_TABLET | Freq: Two times a day (BID) | ORAL | Status: DC
  Administered 2017-01-09 – 2017-01-11 (×5): 50 mg via ORAL
  Filled 2017-01-09 (×5): qty 1

## 2017-01-09 MED ORDER — MIDAZOLAM HCL 2 MG/2ML IJ SOLN
INTRAMUSCULAR | Status: DC | PRN
  Administered 2017-01-09: 1 mg via INTRAVENOUS

## 2017-01-09 MED ORDER — METOPROLOL SUCCINATE ER 50 MG PO TB24
ORAL_TABLET | ORAL | Status: AC
  Filled 2017-01-09: qty 1

## 2017-01-09 MED ORDER — NAPROXEN SODIUM 550 MG PO TABS
550.0000 mg | ORAL_TABLET | Freq: Every day | ORAL | Status: DC | PRN
  Filled 2017-01-09: qty 1

## 2017-01-09 MED ORDER — HEPARIN (PORCINE) IN NACL 2-0.9 UNIT/ML-% IJ SOLN
INTRAMUSCULAR | Status: AC
Start: 2017-01-09 — End: 2017-01-09
  Filled 2017-01-09: qty 500

## 2017-01-09 MED ORDER — ASPIRIN 81 MG PO CHEW: 81.0000 mg | CHEWABLE_TABLET | ORAL | Status: DC

## 2017-01-09 MED ORDER — METOPROLOL TARTRATE 50 MG PO TABS
50.0000 mg | ORAL_TABLET | Freq: Two times a day (BID) | ORAL | Status: DC
  Administered 2017-01-09: 50 mg via ORAL
  Filled 2017-01-09 (×3): qty 1

## 2017-01-09 MED ORDER — HEPARIN SODIUM (PORCINE) 1000 UNIT/ML IJ SOLN
INTRAMUSCULAR | Status: AC
  Filled 2017-01-09: qty 1

## 2017-01-09 MED ORDER — ACETAMINOPHEN 325 MG PO TABS: 650.0000 mg | ORAL_TABLET | ORAL | Status: DC | PRN

## 2017-01-09 MED ORDER — MIDAZOLAM HCL 2 MG/2ML IJ SOLN
INTRAMUSCULAR | Status: AC
  Filled 2017-01-09: qty 2

## 2017-01-09 MED ORDER — SODIUM CHLORIDE 0.9 % IV SOLN
INTRAVENOUS | Status: DC
  Administered 2017-01-09: 09:00:00 via INTRAVENOUS

## 2017-01-09 MED ORDER — ATORVASTATIN CALCIUM 40 MG PO TABS
40.0000 mg | ORAL_TABLET | Freq: Every day | ORAL | Status: DC
  Administered 2017-01-10 – 2017-01-11 (×2): 40 mg via ORAL
  Filled 2017-01-09 (×2): qty 1

## 2017-01-09 MED ORDER — FENTANYL CITRATE (PF) 100 MCG/2ML IJ SOLN
INTRAMUSCULAR | Status: AC
  Filled 2017-01-09: qty 2

## 2017-01-09 MED ORDER — SODIUM CHLORIDE 0.9% FLUSH
3.0000 mL | Freq: Two times a day (BID) | INTRAVENOUS | Status: DC
  Administered 2017-01-11: 3 mL via INTRAVENOUS

## 2017-01-09 MED ORDER — HEPARIN (PORCINE) IN NACL 100-0.45 UNIT/ML-% IJ SOLN
1400.0000 [IU]/h | INTRAMUSCULAR | Status: DC
  Administered 2017-01-09: 1350 [IU]/h via INTRAVENOUS
  Filled 2017-01-09: qty 250

## 2017-01-09 MED ORDER — VERAPAMIL HCL 2.5 MG/ML IV SOLN
INTRAVENOUS | Status: AC
  Filled 2017-01-09: qty 2

## 2017-01-09 MED ORDER — SODIUM CHLORIDE 0.9 % WEIGHT BASED INFUSION
1.0000 mL/kg/h | INTRAVENOUS | Status: DC
  Administered 2017-01-09 (×2): 1 mL/kg/h via INTRAVENOUS

## 2017-01-09 MED ORDER — ASPIRIN EC 81 MG PO TBEC
81.0000 mg | DELAYED_RELEASE_TABLET | Freq: Every day | ORAL | Status: DC
  Administered 2017-01-10 – 2017-01-11 (×2): 81 mg via ORAL
  Filled 2017-01-09 (×2): qty 1

## 2017-01-09 SURGICAL SUPPLY — 8 items
CATH INFINITI 5FR ANG PIGTAIL (CATHETERS) ×2 IMPLANT
CATH INFINITI JR4 5F (CATHETERS) ×2 IMPLANT
CATH OPTITORQUE JACKY 4.0 5F (CATHETERS) ×2 IMPLANT
DEVICE RAD TR BAND REGULAR (VASCULAR PRODUCTS) ×2 IMPLANT
GLIDESHEATH SLEND SS 6F .021 (SHEATH) ×2 IMPLANT
KIT MANI 3VAL PERCEP (MISCELLANEOUS) ×2 IMPLANT
PACK CARDIAC CATH (CUSTOM PROCEDURE TRAY) ×2 IMPLANT
WIRE ROSEN-J .035X260CM (WIRE) ×2 IMPLANT

## 2017-01-09 NOTE — Progress Notes (Signed)
ANTICOAGULATION CONSULT NOTE - Initial Consult  Pharmacy Consult for heparin  Indication: chest pain/ACS  No Known Allergies  Patient Measurements:   Heparin Dosing Weight: 97.7kg  Vital Signs: Temp: 97.7 F (36.5 C) (07/16 1638) Temp Source: Oral (07/16 1638) BP: 131/80 (07/16 1638) Pulse Rate: 56 (07/16 1638)  Labs:  Recent Labs  01/09/17 1511  HGB 16.7  HCT 49.4  PLT 209  APTT 29  LABPROT 13.6  INR 1.04    Estimated Creatinine Clearance: 61.2 mL/min (A) (by C-G formula based on SCr of 1.41 mg/dL (H)).   Medical History: Past Medical History:  Diagnosis Date  . CAD (coronary artery disease) 01/09/2017   a. 12/2016 Cath: LM nl, LAD 95p, 4719m, 60/90d, LCX 80p/m, 1258m, OM1 2219m, OM2 90, RCA 99p, Ef 55-65%.  . Essential hypertension     Medications:  Prescriptions Prior to Admission  Medication Sig Dispense Refill Last Dose  . aspirin EC 81 MG tablet Take 1 tablet (81 mg total) by mouth daily. 90 tablet 3 01/09/2017 at Unknown time  . atorvastatin (LIPITOR) 40 MG tablet Take 1 tablet (40 mg total) by mouth daily. 90 tablet 3 01/09/2017 at Unknown time  . cetirizine (ZYRTEC) 10 MG tablet Take 10 mg by mouth daily.   01/09/2017 at Unknown time  . diltiazem (TIAZAC) 300 MG 24 hr capsule Take 300 mg by mouth daily.   01/09/2017 at Unknown time  . moexipril (UNIVASC) 7.5 MG tablet Take 7.5 mg by mouth daily.    01/09/2017 at Unknown time  . naproxen sodium (ANAPROX) 220 MG tablet Take 440 mg by mouth daily as needed (pain).    01/09/2017 at Unknown time   Scheduled:  . [START ON 01/10/2017] aspirin EC  81 mg Oral Daily  . [START ON 01/10/2017] atorvastatin  40 mg Oral q1800  . metoprolol tartrate  50 mg Oral BID  . sodium chloride flush  3 mL Intravenous Q12H    Assessment: 71 yo male from [place] with CP and post cath with multivessel disease. Plans were for [facility] at [facility] but the infusion was not scheduled to start until ~ 8pm (8 hours post sheath  removal).  Goal of Therapy:  Heparin level 0.3-0.7 units/ml Monitor platelets by anticoagulation protocol: Yes   Plan:  -Start heparin at 1350 units/hr (~ 14 units/kg/hr) at 8pm -Heparin level in 6 hours and daily wth CBC daily -Will follow surgery plans  Harland Germanndrew Gwenna Fuston, Pharm D 01/09/2017 6:12 PM

## 2017-01-09 NOTE — OR Nursing (Addendum)
Report called to Earle GellBobby Jo on Telemetry, reviewed orders. [facility]  Pt has no complaints of chest pain, TR band removed from right radial at 1415 with no bleeding and pressure dressing of guaze and tegaderm applied, radial pulses intact, skin dry and intact,  vital signs stable, metoprolol 50 given at 1310 and Lipitor and Aspirin held due to dose given this morning.

## 2017-01-09 NOTE — H&P (Signed)
History & Physical    Patient ID: Dale Cook. MRN: [id number], DOB/AGE: 29-Jul-1945   Admit date: 01/09/2017    Primary Physician: Dale Kaplan, MD Primary Cardiologist: Dale Petit. Dale Corin, MD   Patient Profile    71 year old male without prior history of coronary artery disease, who underwent diagnostic catheterization today, July 16, revealing severe multivessel disease requiring transfer to [name].  Past Medical History    Past Medical History:  Diagnosis Date  . CAD (coronary artery disease)    a. 12/2016 Cath: LM nl, LAD 95p, 3m, 60/90d, LCX 80p/m, 15m, OM1 53m, OM2 90, RCA 99p, Ef 55-65%.  . Hypertension     Past Surgical History:  Procedure Laterality Date  . COLONOSCOPY WITH PROPOFOL N/A 11/09/2016   Procedure: COLONOSCOPY WITH PROPOFOL;  Surgeon: Dale Mayotte, MD;  Location: [facility] ENDOSCOPY;  Service: Endoscopy;  Laterality: N/A;     Allergies  No Known Allergies  History of Present Illness    72 year old male without prior cardiac history. He does have a history of hypertension. He was recently evaluated by Dr. Kirke Cook on July 12 secondary to intermittent substernal chest tightness without radiation, associated with dyspnea, primarily occurring with activity, lasting a 10-15 minutes, and resolving with rest. Due to symptoms, decision was made to pursue diagnostic catheterization. This took place on an outpatient basis at [name] this morning and revealed severe multivessel CAD. As result, patient was admitted [name] and placed on heparin following sheath pull. Arrangements were made for transfer to [name]. He has been chest pain free.  Home Medications    Prior to Admission medications   Medication Sig Start Date End Date Taking? Authorizing Provider  aspirin EC 81 MG tablet Take 1 tablet (81 mg total) by mouth daily. 01/05/17   Dale Ouch, MD  atorvastatin (LIPITOR) 40  MG tablet Take 1 tablet (40 mg total) by mouth daily. 01/05/17 04/05/17  Dale Ouch, MD  cetirizine (ZYRTEC) 10 MG tablet Take 10 mg by mouth daily.    [provider]  diltiazem (TIAZAC) 300 MG 24 hr capsule Take 300 mg by mouth daily.    [provider]  moexipril (UNIVASC) 7.5 MG tablet Take 7.5 mg by mouth daily.     [provider]  naproxen sodium (ANAPROX) 220 MG tablet Take 440 mg by mouth daily as needed (pain).     [provider]    Family History    Family History  Problem Relation Age of Onset  . Heart attack Mother     Social History    Social History   Social History  . Marital status: Divorced    Spouse name: N/A  . Number of children: N/A  . Years of education: N/A   Occupational History  . Not on file.   Social History Main Topics  . Smoking status: Never Smoker  . Smokeless tobacco: Never Used  . Alcohol use No  . Drug use: No  . Sexual activity: Not on file   Other Topics Concern  . Not on file   Social History Narrative  . No narrative on file     Review of Systems    General:  No chills, fever, night sweats or weight changes.  Cardiovascular:  +++ chest pain, +++ dyspnea on exertion, no edema, orthopnea, palpitations, paroxysmal nocturnal dyspnea. Dermatological: No rash, lesions/masses Respiratory: No cough, +++ dyspnea on exertion. Urologic: No hematuria,  dysuria Abdominal:   No nausea, vomiting, diarrhea, bright red blood per rectum, melena, or hematemesis Neurologic:  No visual changes, wkns, changes in mental status. All other systems reviewed and are otherwise negative except as noted above.  Physical Exam    Temperature 97.8, heart rate 78, respirations 12, blood pressure 160/86, pulse ox 100% General: Pleasant, NAD Psych: Normal affect. Neuro: Alert and oriented X 3. Moves all extremities spontaneously. HEENT: Normal  Neck: Supple without bruits or JVD. Lungs:  Resp regular and  unlabored, CTA. Heart: RRR no s3, s4, or murmurs. Abdomen: Soft, non-tender, non-distended, BS + x 4.  Extremities: No clubbing, cyanosis or edema. DP/PT/Radials 2+ and equal bilaterally.  Labs    Lab Results  Component Value Date   WBC 10.9 (H) 01/09/2017   HGB 16.7 01/09/2017   HCT 49.4 01/09/2017   MCV 85.2 01/09/2017   PLT 209 01/09/2017     Recent Labs Lab 01/05/17 1530  NA 137  K 4.6  CL 102  CO2 25  BUN 32*  CREATININE 1.41*  CALCIUM 9.4  GLUCOSE 142*    Radiology Studies    Dg Chest 2 View  Result Date: 01/06/2017 CLINICAL DATA:  Preprocedure, cardiac catheterization history of hypertension EXAM: CHEST  2 VIEW COMPARISON:  None. FINDINGS: The heart size and mediastinal contours are within normal limits. Both lungs are clear. Degenerative changes of the AC joints and spine. IMPRESSION: No active cardiopulmonary disease. Electronically Signed   By: Dale Cook M.D.   On: 01/06/2017 02:40    ECG & Cardiac Imaging    Cath report as outlined in the past medical history  Assessment & Plan    1. Unstable angina/coronary artery disease: Patient has a several week history of exertional angina and dyspnea and underwent diagnostic catheterization earlier today revealing severe multivessel coronary artery disease. He was admitted [name] and now has [name]. He'll be transferred for CT surgical evaluation. Continue aspirin, statin, and beta blocker therapy.  2. Essential hypertension: Continue beta blocker therapy.  3. Lipids: Plan to add high potency statin the setting of catheterization findings. Follow-up lipids and LFTs.  Signed, Dale Duckinghristopher Giulliana Mcroberts, NP 01/09/2017, 5:02 PM

## 2017-01-09 NOTE — H&P (View-Only) (Signed)
Cardiology Office Note   Date:  01/05/2017   ID:  Dale Grammestis Dale Joneen BoersRankin Jr., DOB 08/12/1945, MRN [id number]  PCP:  Derwood KaplanEason, Ernest B, MD  Cardiologist:   Lorine BearsMuhammad Arida, MD   Chief Complaint  Patient presents with  . other    Per Dr. Maryellen PileEason c/o chest pain and elevated BP. Meds reviewed verbally with pt.      History of Present Illness: Dale DollarOtis Dale Cook Jr. is a 71 y.o. male who Was referred by Dr. Maryellen PileEason for  urgent evaluation of chest pain. He has no prior cardiac history and has prolonged history of hypertension. He is not a smoker and has no family history of coronary artery disease. 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 Over the last month, he has experienced intermittent substernal chest tightness without radiation. This is mainly happening with physical activities and it started as brief in duration lasting for a few minutes but gradually has increased with prolonged episodes up to 10-15 minutes. Typically the discomfort resolves if he rest for about 5 or 10 minutes. This has been associated with shortness of breath but no orthopnea or PND. No similar symptoms and no previous cardiac evaluation.   Past Medical History:  Diagnosis Date  . Hypertension     Past Surgical History:  Procedure Laterality Date  . COLONOSCOPY WITH PROPOFOL N/A 11/09/2016   Procedure: COLONOSCOPY WITH PROPOFOL;  Surgeon: Earline MayotteByrnett, Jeffrey W, MD;  Location: [facility] END[facility]COPY;  Service: Endoscopy;  Laterality: N/A;     Current Outpatient Prescriptions  Medication Sig Dispense Refill  . diltiazem (TIAZAC) 300 MG 24 hr capsule Take 300 mg by mouth daily.    . fexofenadine (ALLEGRA) 180 MG tablet Take 180 mg by mouth daily as needed for allergies or rhinitis.    Marland Kitchen. moexipril (UNIVASC) 7.5 MG tablet Take 7.5 mg by mouth at bedtime.    . naproxen sodium (ANAPROX) 220 MG tablet Take 220 mg by mouth 2 (two) times daily as needed.     No current facility-administered medications for this visit.      Allergies:   Patient has no known allergies.    Social History:  The patient  reports that he has never smoked. He has never used smokeless tobacco. He reports that he does not drink alcohol or use drugs.   Family History:  The patient's family history includes Heart attack in his mother.    ROS:  Please see the history of present illness.   Otherwise, review of systems are positive for none.   All other systems are reviewed and negative.    PHYSICAL EXAM: VS:  BP (!) 150/78 (BP Location: Right Arm, Patient Position: Sitting, Cuff Size: Normal)   Pulse 85   Ht 6' (1.829 m)   Wt 219 lb 8 oz (99.6 kg)   BMI 29.77 kg/m  , BMI Body mass index is 29.77 kg/m. GEN: Well nourished, well developed, in no acute distress  HEENT: normal  Neck: no JVD,  or masses. Left carotid bruit Cardiac: RRR; no  rubs, or gallops,no edema .  2/6 systolic ejection murmur in the aortic area which is early peaking Respiratory:  clear to auscultation bilaterally, normal work of breathing GI: soft, nontender, nondistended, + BS MS: no deformity or atrophy  Skin: warm and dry, no rash Neuro:  Strength and sensation are intact Psych: euthymic mood, full affect   EKG:  EKG is ordered today. The ekg ordered today demonstrates normal sinus rhythm with possible old inferior  infarct.   Recent Labs: No results found for requested labs within last 8760 hours.    Lipid Panel No results found for: CHOL, TRIG, HDL, CHOLHDL, VLDL, LDLCALC, LDLDIRECT    Wt Readings from Last 3 Encounters:  01/05/17 219 lb 8 oz (99.6 kg)  11/09/16 221 lb (100.2 kg)  09/21/16 212 lb (96.2 kg)      PAD Screen 01/05/2017  Previous PAD dx? No  Previous surgical procedure? No  Pain with walking? Yes  Subsides with rest? Yes  Feet/toe relief with dangling? No  Painful, non-healing ulcers? No  Extremities discolored? No      ASSESSMENT AND PLAN:  1.  Unstable angina: The patient's symptoms are highly worrisome for  unstable angina. I advised him to start taking aspirin 81 mg once daily and atorvastatin 40 mg once daily. I recommend proceeding with urgent cardiac catheterization and possible coronary intervention. I discussed risks, benefits and alternatives. I strongly advised him to call EMS if he develops chest pain at rest or non-resolving exertional chest pain.  2. Systolic murmur suggestive of aortic sclerosis or mild stenosis: This can be evaluated at same time of cardiac catheterization.  3. Left carotid bruit: I requested carotid Doppler.  4. Hyperlipidemia: I started atorvastatin 40 mg daily.    Disposition:   FU with me in 1 month  Signed,  [name], MD  01/05/2017 2:44 PM    [facility]

## 2017-01-09 NOTE — Progress Notes (Signed)
Patient is transfer  to [place] via [facility] in a stable condition, [facility] and [facility] transporter ,

## 2017-01-09 NOTE — Consult Note (Signed)
ANTICOAGULATION CONSULT NOTE - Initial Consult  Pharmacy Consult for heparin drip Indication: chest pain/ACS  Not on File  Patient Measurements: Height: 6' (182.9 cm) Weight: 219 lb (99.3 kg) IBW/kg (Calculated) : 77.6 Heparin Dosing Weight: 97.7kg  Vital Signs: Temp: 97.8 F (36.6 C) (07/16 0837) Temp Source: Oral (07/16 0837) BP: 152/77 (07/16 1315) Pulse Rate: 69 (07/16 1315)  Labs: No results for input(s): HGB, HCT, PLT, APTT, LABPROT, INR, HEPARINUNFRC, HEPRLOWMOCWT, CREATININE, CKTOTAL, CKMB, TROPONINI in the last 72 hours.  Estimated Creatinine Clearance: 61.2 mL/min (A) (by C-G formula based on SCr of 1.41 mg/dL (H)).   Medical History: Past Medical History:  Diagnosis Date  . CAD (coronary artery disease)   . Hypertension     Medications:  Scheduled:  . [START ON 01/10/2017] aspirin EC  81 mg Oral Daily  . [START ON 01/10/2017] atorvastatin  40 mg Oral Daily  . metoprolol tartrate  50 mg Oral BID  . sodium chloride flush  3 mL Intravenous Q12H    Assessment: Pt is s/p cardiac cath for unstable angina. Pharmacy consulted to start heparin drip 8 hours after sheath removal. According to procedure log, sheath was removed at 1151. Drip will start around 1950. [facility] with the heart team due to the severity of his disease.  Goal of Therapy:  Heparin level 0.3-0.7 units/ml Monitor platelets by anticoagulation protocol: Yes   Plan: Start heparin drip around 1950 No Bolus Start heparin infusion at 1250 units/hr Check anti-Xa level in 6 hours and daily while on heparin Continue to monitor H&H and platelets   Quamesha Mullet D Wendy Mikles, Pharm.D, BCPS Clinical Pharmacist  01/09/2017,1:19 PM

## 2017-01-09 NOTE — Interval H&P Note (Signed)
Cath Lab Visit (complete for each Cath Lab visit)  Clinical Evaluation Leading to the Procedure:   ACS: Yes.   unstable angina   Non-ACS:  n/a       History and Physical Interval Note:  01/09/2017 11:17 AM  Dale Joline MaxcyWilbert Ventress Jr.  has presented today for surgery, with the diagnosis of LT Cath   Unstable angina  The various methods of treatment have been discussed with the patient and family. After consideration of risks, benefits and other options for treatment, the patient has consented to  Procedure(s): Left Heart Cath and Coronary Angiography (Left) as a surgical intervention .  The patient's history has been reviewed, patient examined, no change in status, stable for surgery.  I have reviewed the patient's chart and labs.  Questions were answered to the patient's satisfaction.     Lorine BearsMuhammad Arida

## 2017-01-10 ENCOUNTER — Encounter: Payer: Self-pay | Admitting: Cardiovascular Disease

## 2017-01-10 DIAGNOSIS — I200 Unstable angina: Secondary | ICD-10-CM

## 2017-01-10 DIAGNOSIS — I10 Essential (primary) hypertension: Secondary | ICD-10-CM

## 2017-01-10 DIAGNOSIS — I25110 Atherosclerotic heart disease of native coronary artery with unstable angina pectoris: Secondary | ICD-10-CM

## 2017-01-10 LAB — URINALYSIS, COMPLETE (UACMP) WITH MICROSCOPIC
BILIRUBIN URINE: NEGATIVE
Bacteria, UA: NONE SEEN
GLUCOSE, UA: NEGATIVE mg/dL
Hgb urine dipstick: NEGATIVE
KETONES UR: NEGATIVE mg/dL
Nitrite: NEGATIVE
PH: 6 (ref 5.0–8.0)
Protein, ur: NEGATIVE mg/dL
SPECIFIC GRAVITY, URINE: 1.014 (ref 1.005–1.030)

## 2017-01-10 LAB — HEPARIN LEVEL (UNFRACTIONATED)
HEPARIN UNFRACTIONATED: 0.75 [IU]/mL — AB (ref 0.30–0.70)
HEPARIN UNFRACTIONATED: 0.61 [IU]/mL (ref 0.30–0.70)
Heparin Unfractionated: 0.31 IU/mL (ref 0.30–0.70)

## 2017-01-10 LAB — BASIC METABOLIC PANEL
ANION GAP: 5 (ref 5–15)
BUN: 21 mg/dL — AB (ref 6–20)
CALCIUM: 8.4 mg/dL — AB (ref 8.9–10.3)
CO2: 23 mmol/L (ref 22–32)
Chloride: 105 mmol/L (ref 101–111)
Creatinine, Ser: 1.19 mg/dL (ref 0.61–1.24)
GFR calc Af Amer: >60 mL/min (ref >60)
GLUCOSE: 151 mg/dL — AB (ref 65–99)
Potassium: 4 mmol/L (ref 3.5–5.1)
Sodium: 133 mmol/L — ABNORMAL LOW (ref 135–145)

## 2017-01-10 LAB — LIPID PANEL
CHOLESTEROL: 127 mg/dL (ref 0–200)
HDL: 29 mg/dL — ABNORMAL LOW (ref >40)
LDL CALC: 67 mg/dL (ref 0–99)
TRIGLYCERIDES: 154 mg/dL — AB (ref <150)
Total CHOL/HDL Ratio: 4.4 RATIO
VLDL: 31 mg/dL (ref 0–40)

## 2017-01-10 LAB — CBC
HEMATOCRIT: 48 % (ref 39.0–52.0)
HEMOGLOBIN: 15.9 g/dL (ref 13.0–17.0)
MCH: 28.8 pg (ref 26.0–34.0)
MCHC: 33.1 g/dL (ref 30.0–36.0)
MCV: 86.8 fL (ref 78.0–100.0)
Platelets: 182 10*3/uL (ref 150–400)
RBC: 5.53 MIL/uL (ref 4.22–5.81)
RDW: 13.8 % (ref 11.5–15.5)
WBC: 9.6 10*3/uL (ref 4.0–10.5)

## 2017-01-10 LAB — SURGICAL PCR SCREEN
MRSA, PCR: NEGATIVE
Staphylococcus aureus: NEGATIVE

## 2017-01-10 MED ORDER — HEPARIN (PORCINE) IN NACL 100-0.45 UNIT/ML-% IJ SOLN
1300.0000 [IU]/h | INTRAMUSCULAR | Status: AC
  Administered 2017-01-10: 1350 [IU]/h via INTRAVENOUS
  Administered 2017-01-11: 1300 [IU]/h via INTRAVENOUS
  Filled 2017-01-10 (×2): qty 250

## 2017-01-10 NOTE — Consult Note (Signed)
[address]       [place] [address]             [phone]          CARDIOTHORACIC SURGERY CONSULTATION REPORT  PCP is Derwood Kaplan, MD Referring Provider is [name], MD  Reason for consultation:  Multivessel CAD with Unstable Angina   HPI:  Patient is a 71 year old African-American male with no previous history of coronary artery disease but risk factors notable for history of hypertension and hyperlipidemia. The patient states that approximately 6 months ago he began to experience symptoms of classical exertional substernal chest pain described as pressure or the sensation of a heavy weight across his mid chest which occur only with physical exertion, is associated with shortness of breath, and is always probably relieved by rest. Symptoms initially wax and wane in severity but accelerated in frequency and severity in recent weeks. Last week he had a prolonged episode of chest discomfort with activity that persisted for nearly 30 minutes.  He was evaluated by Dr. Kirke Corin and underwent diagnostic cardiac catheterization on 01/09/2017.  Catheterization revealed critical three-vessel coronary artery disease with preserved left ventricular systolic function. The patient was transferred to [facility] for surgical consultation and treatment. The patient has not had any symptoms of chest discomfort since his catheterization.  The patient is single and lives in [place]. He has 3 grown children. 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 He has been retired for several years but continues to work part-time jobs. He has remained physically active and without any significant limitations until recently. He describes classical symptoms of accelerating frequency and severity of exertional angina. He denies any history of chest pain or chest tightness occurring at rest. He has not had  nocturnal symptoms. He has had postprandial chest discomfort. Symptoms are always relieved by rest. He denies any symptoms of exertional shortness of breath other than those which occurred in the setting of chest discomfort. He denies any history of PND, orthopnea, or lower extremity edema. He has not had palpitations, dizzy spells, nor syncope.  Past Medical History:  Diagnosis Date  . Arthritis    "hands, knees" (01/09/2017)  . CAD (coronary artery disease) 01/09/2017   a. 12/2016 Cath: LM nl, LAD 95p, 70m, 60/90d, LCX 80p/m, 138m, OM1 35m, OM2 90, RCA 99p, Ef 55-65%.  . Essential hypertension   . High cholesterol     Past Surgical History:  Procedure Laterality Date  . CARDIAC CATHETERIZATION  01/09/2017   "scheduling me for OHS"  . COLONOSCOPY WITH PROPOFOL N/A 11/09/2016   Procedure: COLONOSCOPY WITH PROPOFOL;  Surgeon: Earline Mayotte, MD;  Location: [facility] ENDOSCOPY;  Service: Endoscopy;  Laterality: N/A;  . LEFT HEART CATH AND CORONARY ANGIOGRAPHY Left 01/09/2017   Procedure: Left Heart Cath and Coronary Angiography;  Surgeon: Iran Ouch, MD;  Location: [facility] INVASIVE CV LAB;  Service: Cardiovascular;  Laterality: Left;    Family History  Problem Relation Age of Onset  . Heart attack Mother     Social History   Social History  . Marital status: Divorced    Spouse name: N/A  . Number of children: N/A  . Years of education: N/A   Occupational History  . Not on file.   Social History Main Topics  . Smoking status: Never Smoker  . Smokeless tobacco: Never Used  . Alcohol use  No  . Drug use: No  . Sexual activity: Not Currently   Other Topics Concern  . Not on file   Social History Narrative  . No narrative on file    Prior to Admission medications   Medication Sig Start Date End Date Taking? Authorizing Provider  aspirin EC 81 MG tablet Take 1 tablet (81 mg total) by mouth daily. 01/05/17  Yes Iran Ouch, MD  atorvastatin (LIPITOR) 40 MG tablet Take  1 tablet (40 mg total) by mouth daily. 01/05/17 04/05/17 Yes Iran Ouch, MD  cetirizine (ZYRTEC) 10 MG tablet Take 10 mg by mouth daily.   Yes [provider]  diltiazem (TIAZAC) 300 MG 24 hr capsule Take 300 mg by mouth daily.   Yes [provider]  moexipril (UNIVASC) 7.5 MG tablet Take 7.5 mg by mouth daily.    Yes [provider]  naproxen sodium (ANAPROX) 220 MG tablet Take 440 mg by mouth daily as needed (pain).    Yes [provider]    Current Facility-Administered Medications  Medication Dose Route Frequency Provider Last Rate Last Dose  . 0.9 %  sodium chloride infusion  250 mL Intravenous PRN Ok Anis, NP      . acetaminophen (TYLENOL) tablet 650 mg  650 mg Oral Q4H PRN Ok Anis, NP      . aspirin EC tablet 81 mg  81 mg Oral Daily Ok Anis, NP   81 mg at 01/10/17 1203  . atorvastatin (LIPITOR) tablet 40 mg  40 mg Oral q1800 Ok Anis, NP      . heparin ADULT infusion 100 units/mL (25000 units/255mL sodium chloride 0.45%)  1,350 Units/hr Intravenous Continuous Phillips Climes D, RPH 13.5 mL/hr at 01/10/17 1418 1,350 Units/hr at 01/10/17 1418  . metoprolol tartrate (LOPRESSOR) tablet 50 mg  50 mg Oral BID Ok Anis, NP   50 mg at 01/10/17 1203  . nitroGLYCERIN (NITROSTAT) SL tablet 0.4 mg  0.4 mg Sublingual Q5 Min x 3 PRN Ok Anis, NP      . ondansetron [facility]) injection 4 mg  4 mg Intravenous Q6H PRN Ok Anis, NP      . sodium chloride flush (NS) 0.9 % injection 3 mL  3 mL Intravenous Q12H Nicolasa Ducking R, NP      . sodium chloride flush (NS) 0.9 % injection 3 mL  3 mL Intravenous PRN Ok Anis, NP        No Known Allergies    Review of Systems:   General:  normal appetite, normal energy, no weight gain, no weight loss, no fever  Cardiac:  + chest pain with exertion, no chest pain at rest, + SOB with exertion, no resting SOB, no PND, no  orthopnea, no palpitations, no arrhythmia, no atrial fibrillation, no LE edema, no dizzy spells, no syncope  Respiratory:  no shortness of breath, no home oxygen, + recent productive cough, no dry cough, no bronchitis, no wheezing, no hemoptysis, no asthma, no pain with inspiration or cough, no sleep apnea, no CPAP at night  GI:   no difficulty swallowing, no reflux, no frequent heartburn, no hiatal hernia, no abdominal pain, no constipation, no diarrhea, no hematochezia, no hematemesis, no melena  GU:   no dysuria,  no frequency, no urinary tract infection, no hematuria, no enlarged prostate, no kidney stones, no kidney disease  Vascular:  no pain suggestive of claudication, no pain in feet, no leg cramps, no  varicose veins, no DVT, no non-healing foot ulcer  Neuro:   no stroke, no TIA's, no seizures, no headaches, no temporary blindness one eye,  no slurred speech, no peripheral neuropathy, no chronic pain, no instability of gait, no memory/cognitive dysfunction  Musculoskeletal: + arthritis - primarily involving the knees, no joint swelling, no myalgias, no difficulty walking, normal mobility   Skin:   no rash, no itching, no skin infections, no pressure sores or ulcerations  Psych:   no anxiety, no depression, no nervousness, no unusual recent stress  Eyes:   no blurry vision, no floaters, no recent vision changes, + wears glasses or contacts  ENT:   no hearing loss, no loose or painful teeth, no dentures, last saw dentist a few years ago  Hematologic:  no easy bruising, no abnormal bleeding, no clotting disorder, no frequent epistaxis  Endocrine:  no diabetes, does not check CBG's at home     Physical Exam:   BP 110/60 (BP Location: Left Arm)   Pulse 70   Temp 98.1 F (36.7 C) (Oral)   Resp 20   Ht 6' (1.829 m)   Wt 213 lb 8 oz (96.8 kg)   SpO2 97%   BMI 28.96 kg/m   General:    well-appearing  HEENT:  Unremarkable   Neck:   no JVD, no bruits, no adenopathy   Chest:   clear to  auscultation, symmetrical breath sounds, no wheezes, no rhonchi   CV:   RRR, no murmur   Abdomen:  soft, non-tender, no masses   Extremities:  warm, well-perfused, pulses diminished but palpable, no lower extremity edema  Rectal/GU  Deferred  Neuro:   Grossly non-focal and symmetrical throughout  Skin:   Clean and dry, no rashes, no breakdown  Diagnostic Tests:  Left Heart Cath and Coronary Angiography  Conclusion     Prox Cx to Mid Cx lesion, 80 %stenosed.  Ost 1st Mrg to 1st Mrg lesion, 60 %stenosed.  Mid Cx lesion, 100 %stenosed.  Ost LAD to Prox LAD lesion, 95 %stenosed.  Mid LAD lesion, 60 %stenosed.  Dist LAD-2 lesion, 60 %stenosed.  Dist LAD-1 lesion, 90 %stenosed.  Ost 2nd Mrg to 2nd Mrg lesion, 90 %stenosed.  Prox RCA to Mid RCA lesion, 99 %stenosed.  The left ventricular systolic function is normal.  LV end diastolic pressure is normal.  The left ventricular ejection fraction is 55-65% by visual estimate.   1. 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 2. Normal LV systolic function and high normal left ventricular end-diastolic pressure.  Recommendations: Overall difficult revascularization options given that the diffuse disease. PCI will require long segments of stenting especially the LAD. Also the left circumflex disease is a bifurcation stenosis involving 2 large OM branches. CABG is limited by significant distal LAD disease. Given severity of disease, the patient will need to be hospitalized and started on a heparin drip 8 hours after sheath pull. I switched diltiazem to metoprolol. Continue atorvastatin. [facility] for a heart team decision and consultation with CVTS. It might be possible to place a graft on both mid and distal LAD.   Indications   Unstable angina (HCC) [I20.0 (ICD-10-CM)]  Procedural Details/Technique   Technical Details Procedural Details: The  right wrist was prepped, draped, and anesthetized with 1% lidocaine. Using the modified Seldinger technique, a 5 French sheath was introduced into the right radial artery. 2.5 mg of verapamil was administered through the sheath, weight-based unfractionated  heparin was administered intravenously. A Jackie catheter was used for selective coronary angiography. A pigtail catheter was used for left ventriculography. The JR4 was needed to engage the right coronary artery. Catheter exchanges were performed over an exchange length guidewire. There were no immediate procedural complications. A TR band was used for radial hemostasis at the completion of the procedure. The patient was transferred to the post catheterization recovery area for further monitoring.   Estimated blood loss <50 mL.  During this procedure the patient was administered the following to achieve and maintain moderate conscious sedation: Versed 1 mg, Fentanyl 25 mcg, while the patient's heart rate, blood pressure, and oxygen saturation were continuously monitored. The period of conscious sedation was 24 minutes, of which I was present face-to-face 100% of this time.    Coronary Findings   Dominance: Left  Left Main  Vessel is angiographically normal.  Left Anterior Descending  Ost LAD to Prox LAD lesion, 95% stenosed.  Mid LAD lesion, 60% stenosed.  Dist LAD-1 lesion, 90% stenosed.  Dist LAD-2 lesion, 60% stenosed.  First Diagonal Branch  Vessel is small in size. The vessel exhibits minimal luminal irregularities.  Second Diagonal Branch  Vessel is small in size. The vessel exhibits minimal luminal irregularities.  Third Diagonal Branch  Vessel is small in size. Vessel is angiographically normal.  Left Circumflex  Prox Cx to Mid Cx lesion, 80% stenosed.  Mid Cx lesion, 100% stenosed.  First Obtuse Marginal Branch  Ost 1st Mrg to 1st Mrg lesion, 60% stenosed.  Second Obtuse Marginal Branch  Ost 2nd Mrg to 2nd Mrg lesion, 90%  stenosed.  Fourth Obtuse Marginal Branch  4th Mrg filled by collaterals from 3rd Sept.  Right Coronary Artery  Prox RCA to Mid RCA lesion, 99% stenosed.  Wall Motion              Left Heart   Left Ventricle The left ventricular size is normal. The left ventricular systolic function is normal. LV end diastolic pressure is normal. The left ventricular ejection fraction is 55-65% by visual estimate. No regional wall motion abnormalities.    Coronary Diagrams   Diagnostic Diagram       Implants     No implant documentation for this case.  PACS Images   Show images for Cardiac catheterization   Link to Procedure Log   Procedure Log    Hemo Data   AO Systolic Cath Pressure AO Diastolic Cath Pressure AO Mean Cath Pressure LV Systolic Cath Pressure LV End Diastolic  115 56 mmHg 78 mmHg -- --  115 59 mmHg 82 mmHg -- --  108 59 mmHg 79 mmHg -- --  110 59 mmHg 80 mmHg -- --  110 59 mmHg 78 mmHg -- --  -- -- -- 123 mmHg 12 mmHg  -- -- -- 119 mmHg 13 mmHg  -- -- -- 120 mmHg 13 mmHg  118 59 mmHg 82 mmHg -- --      Impression:  Patient presents with classical symptoms of angina pectoris and has severe three-vessel coronary artery disease with preserved left ventricular systolic function.  I have personally reviewed the patient's diagnostic cardiac catheterization and agree with interpretation as noted by Dr. Kirke Corin.  I agree the patient would best be treated with surgical revascularization.  Plan:  I have reviewed the indications, risks, and potential benefits of coronary artery bypass grafting with the patient and his ex-wife.  Alternative treatment strategies have been discussed, including the relative risks, benefits and long  term prognosis associated with medical therapy, percutaneous coronary intervention, and surgical revascularization.  The patient understands and accepts all potential associated risks of surgery including but not limited to risk of death, stroke or  other neurologic complication, myocardial infarction, congestive heart failure, respiratory failure, renal failure, bleeding requiring blood transfusion and/or reexploration, aortic dissection or other major vascular complication, arrhythmia, heart block or bradycardia requiring permanent pacemaker, pneumonia, pleural effusion, wound infection, pulmonary embolus or other thromboembolic complication, chronic pain or other delayed complications related to median sternotomy, or the late recurrence of symptomatic ischemic heart disease and/or congestive heart failure.  The importance of long term risk modification have been emphasized.  All questions answered.  We plan to proceed with coronary artery bypass grafting on Thursday, 01/12/2017.   I spent in excess of 120 minutes during the conduct of this hospital consultation and >50% of this time involved direct face-to-face encounter for counseling and/or coordination of the patient's care.    Salvatore Decent. Cornelius Moras, MD 01/10/2017 3:10 PM

## 2017-01-10 NOTE — Progress Notes (Signed)
ANTICOAGULATION CONSULT NOTE - Initial Consult  Pharmacy Consult for heparin  Indication: chest pain/ACS  No Known Allergies  Patient Measurements: Heparin Dosing Weight: 97.7kg  Vital Signs: Temp: 97.9 F (36.6 C) (07/16 1955) Temp Source: Oral (07/16 1955) BP: 145/81 (07/16 1955) Pulse Rate: 53 (07/16 1955)  Labs:  Recent Labs  01/09/17 1511 01/10/17 0140  HGB 16.7 15.9  HCT 49.4 48.0  PLT 209 182  APTT 29  --   LABPROT 13.6  --   INR 1.04  --   HEPARINUNFRC  --  0.31  CREATININE  --  1.19    Estimated Creatinine Clearance: 72.5 mL/min (by C-G formula based on SCr of 1.19 mg/dL).   Medical History: Past Medical History:  Diagnosis Date  . Arthritis    "hands, knees" (01/09/2017)  . CAD (coronary artery disease) 01/09/2017   a. 12/2016 Cath: LM nl, LAD 95p, 6992m, 60/90d, LCX 80p/m, 13776m, OM1 2892m, OM2 90, RCA 99p, Ef 55-65%.  . Essential hypertension   . High cholesterol     Medications:  Prescriptions Prior to Admission  Medication Sig Dispense Refill Last Dose  . aspirin EC 81 MG tablet Take 1 tablet (81 mg total) by mouth daily. 90 tablet 3 01/09/2017 at Unknown time  . atorvastatin (LIPITOR) 40 MG tablet Take 1 tablet (40 mg total) by mouth daily. 90 tablet 3 01/09/2017 at Unknown time  . cetirizine (ZYRTEC) 10 MG tablet Take 10 mg by mouth daily.   01/09/2017 at Unknown time  . diltiazem (TIAZAC) 300 MG 24 hr capsule Take 300 mg by mouth daily.   01/09/2017 at Unknown time  . moexipril (UNIVASC) 7.5 MG tablet Take 7.5 mg by mouth daily.    01/09/2017 at Unknown time  . naproxen sodium (ANAPROX) 220 MG tablet Take 440 mg by mouth daily as needed (pain).    01/09/2017 at Unknown time   Scheduled:  . aspirin EC  81 mg Oral Daily  . atorvastatin  40 mg Oral q1800  . metoprolol tartrate  50 mg Oral BID  . sodium chloride flush  3 mL Intravenous Q12H    Assessment: 71 yo male from  with CP and post cath with multivessel disease. Plans were for heparin  [facility] but the infusion was not scheduled to start until ~ 8pm (8 hours post sheath removal).   Heparin level essentially therapeutic at 0.31. CBC stable with no overt s/s bleeding noted.   Goal of Therapy:  Heparin level 0.3-0.7 units/ml Monitor platelets by anticoagulation protocol: Yes   Plan:  -Increase heparin gtt to 1400 units/hr  -Heparin level in 6 hours and daily wth CBC daily -Monitor for s/s bleeding -F/u surgery plans   York CeriseKatherine Cook, PharmD Clinical Pharmacist 01/10/17 2:38 AM

## 2017-01-10 NOTE — Progress Notes (Signed)
Progress Note  Patient Name: Dale Cook. Date of Encounter: 01/10/2017  Primary Cardiologist: Dr. Kirke Corin   Subjective   Currently CP free. No dyspnea. No complaints. Family by bedside.   Inpatient Medications    Scheduled Meds: . aspirin EC  81 mg Oral Daily  . atorvastatin  40 mg Oral q1800  . metoprolol tartrate  50 mg Oral BID  . sodium chloride flush  3 mL Intravenous Q12H   Continuous Infusions: . sodium chloride    . heparin 1,400 Units/hr (01/10/17 0301)   PRN Meds: sodium chloride, acetaminophen, nitroGLYCERIN, ondansetron (ZOFRAN) IV, sodium chloride flush   Vital Signs    Vitals:   01/09/17 1955 01/10/17 0607  BP: (!) 145/81 129/80  Pulse: (!) 53 62  Resp: 17 16  Temp: 97.9 F (36.6 C) 98.4 F (36.9 C)  TempSrc: Oral Oral  SpO2: 98% 97%  Weight:  213 lb 6.4 oz (96.8 kg)  Height:  6' (1.829 m)    Intake/Output Summary (Last 24 hours) at 01/10/17 0912 Last data filed at 01/10/17 0700  Gross per 24 hour  Intake              582 ml  Output             2300 ml  Net            -1718 ml   Filed Weights   01/10/17 0607  Weight: 213 lb 6.4 oz (96.8 kg)    Telemetry    NSR - Personally Reviewed  ECG    NSR - Personally Reviewed  Physical Exam   GEN: No acute distress.   Neck: No JVD Cardiac: RRR, no murmurs, rubs, or gallops.  Respiratory: Clear to auscultation bilaterally. GI: Soft, nontender, non-distended  MS: No edema; No deformity. Neuro:  Nonfocal  Psych: Normal affect   Labs    Chemistry Recent Labs Lab 01/05/17 1530 01/10/17 0140  NA 137 133*  K 4.6 4.0  CL 102 105  CO2 25 23  GLUCOSE 142* 151*  BUN 32* 21*  CREATININE 1.41* 1.19  CALCIUM 9.4 8.4*  GFRNONAA 50* >60  GFRAA 58* >60  ANIONGAP 10 5     Hematology Recent Labs Lab 01/05/17 1530 01/09/17 1511 01/10/17 0140  WBC 21.4* 10.9* 9.6  RBC 5.85 5.79 5.53  HGB 16.5 16.7 15.9  HCT 50.2 49.4 48.0  MCV 85.9 85.2 86.8  MCH 28.3 28.9 28.8  MCHC  32.9 33.9 33.1  RDW 14.7* 14.4 13.8  PLT 210 209 182    Cardiac EnzymesNo results for input(s): TROPONINI in the last 168 hours. No results for input(s): TROPIPOC in the last 168 hours.   BNPNo results for input(s): BNP, PROBNP in the last 168 hours.   DDimer No results for input(s): DDIMER in the last 168 hours.   Radiology    No results found.  Cardiac Studies   Procedures   Left Heart Cath and Coronary Angiography  Conclusion     Prox Cx to Mid Cx lesion, 80 %stenosed.  Ost 1st Mrg to 1st Mrg lesion, 60 %stenosed.  Mid Cx lesion, 100 %stenosed.  Ost LAD to Prox LAD lesion, 95 %stenosed.  Mid LAD lesion, 60 %stenosed.  Dist LAD-2 lesion, 60 %stenosed.  Dist LAD-1 lesion, 90 %stenosed.  Ost 2nd Mrg to 2nd Mrg lesion, 90 %stenosed.  Prox RCA to Mid RCA lesion, 99 %stenosed.  The left ventricular systolic function is normal.  LV end diastolic pressure is  normal.  The left ventricular ejection fraction is 55-65% by visual estimate.   1. 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 2. Normal LV systolic function and high normal left ventricular end-diastolic pressure.     Patient Profile     71 year old male without prior history of coronary artery disease, who [facility], revealing severe multivessel disease [facility].  Assessment & Plan    1. CAD: LHC 01/09/17 showed severe 3V CAD (angiographic details outlined above). EF normal. Awaiting surgical consultation for CABG. Continue IV heparin, ASA, statin and BB. He is chest pain free.   2. DLD: LDL at goal at 67 mg/dL, however HDL is low at 29 and TG slightly elevated at 154. Continue Lipitor 40 mg.   3. HTN: controlled on current regimen. Continue metoprolol.   4. Hyperglycemia: morning BG elevated at 151. No prior h/o DM noted in record.  Check Hgb A1c    Signed, Robbie LisBrittainy Simmons, PA-C  01/10/2017, 9:12 AM    I have seen and examined the patient along with Robbie LisBrittainy Simmons, PA-C.  I have reviewed the chart, notes and new data.  I agree with PA's note.  Key new complaints: currently pain free Key examination changes: no arrhythmia and no overt  Key new findings / data: preop w/u for CABG in progress.  PLAN: To be evaluated by Dr. Cornelius Moraswen today, potential CABG Thursday or Friday  Thurmon FairMihai Bohden Dung, MD, [facility] [phone] 01/10/2017, 10:47 AM

## 2017-01-10 NOTE — Progress Notes (Signed)
ANTICOAGULATION CONSULT NOTE - Follow Up Consult  Pharmacy Consult:  Heparin Indication: ACS s/p cath  No Known Allergies  Patient Measurements: Height: 6' (182.9 cm) Weight: 213 lb 8 oz (96.8 kg) IBW/kg (Calculated) : 77.6 Heparin Dosing Weight: 97 kg  Vital Signs: Temp: 98.4 F (36.9 C) (07/17 0607) Temp Source: Oral (07/17 0607) BP: 110/60 (07/17 1114) Pulse Rate: 70 (07/17 1203)  Labs:  Recent Labs  01/09/17 1511 01/10/17 0140 01/10/17 0821 01/10/17 1033  HGB 16.7 15.9  --   --   HCT 49.4 48.0  --   --   PLT 209 182  --   --   APTT 29  --   --   --   LABPROT 13.6  --   --   --   INR 1.04  --   --   --   HEPARINUNFRC  --  0.31 0.75* 0.61  CREATININE  --  1.19  --   --     Estimated Creatinine Clearance: 71.7 mL/min (by C-G formula based on SCr of 1.19 mg/dL).    Assessment: [age] YOM s/[facility] which showed multivessel disease.  [facility].  Pharmacy consulted to continue IV heparin in the meantime.  Heparin level is therapeutic and toward the high end of normal.  No bleeding reported.   Goal of Therapy:  Heparin level 0.3-0.7 units/ml Monitor platelets by anticoagulation protocol: Yes    Plan:  Reduce heparin gtt to 1350 units/hr Daily heparin level and CBC F/U TCTS consult    Eliabeth Shoff D. Laney Potashang, PharmD, BCPS Pager:  [phone] 01/10/2017, 12:44 PM

## 2017-01-10 NOTE — Plan of Care (Signed)
Problem: Skin Integrity: Goal: Risk for impaired skin integrity will decrease Outcome: Progressing Skin assessed, no areas of redness or breakdown noted.  Patient is able to reposition self frequently.  Patient verbalized understanding of reporting any changes in skin integrity to RN.  Patient progressing towards goal.   

## 2017-01-11 ENCOUNTER — Inpatient Hospital Stay (HOSPITAL_COMMUNITY): Payer: Medicare PPO

## 2017-01-11 ENCOUNTER — Inpatient Hospital Stay: Payer: Medicare PPO

## 2017-01-11 ENCOUNTER — Other Ambulatory Visit (HOSPITAL_COMMUNITY): Payer: Medicare PPO

## 2017-01-11 ENCOUNTER — Ambulatory Visit: Payer: Medicare HMO | Admitting: Internal Medicine

## 2017-01-11 ENCOUNTER — Encounter (HOSPITAL_COMMUNITY): Payer: Self-pay | Admitting: Certified Registered Nurse Anesthetist

## 2017-01-11 ENCOUNTER — Encounter: Payer: Self-pay | Admitting: Cardiovascular Disease

## 2017-01-11 DIAGNOSIS — Z01810 Encounter for preprocedural cardiovascular examination: Secondary | ICD-10-CM

## 2017-01-11 DIAGNOSIS — I25110 Atherosclerotic heart disease of native coronary artery with unstable angina pectoris: Secondary | ICD-10-CM

## 2017-01-11 DIAGNOSIS — N184 Chronic kidney disease, stage 4 (severe): Secondary | ICD-10-CM

## 2017-01-11 LAB — CBC
HCT: 48.4 % (ref 39.0–52.0)
Hemoglobin: 16.3 g/dL (ref 13.0–17.0)
MCH: 28.8 pg (ref 26.0–34.0)
MCHC: 33.7 g/dL (ref 30.0–36.0)
MCV: 85.7 fL (ref 78.0–100.0)
PLATELETS: 186 10*3/uL (ref 150–400)
RBC: 5.65 MIL/uL (ref 4.22–5.81)
RDW: 13.9 % (ref 11.5–15.5)
WBC: 9.3 10*3/uL (ref 4.0–10.5)

## 2017-01-11 LAB — ECHOCARDIOGRAM COMPLETE
Ao-asc: 35 cm
Area-P 1/2: 2.59 cm2
CHL CUP DOP CALC LVOT VTI: 20.6 cm
CHL CUP MV DEC (S): 289
E/e' ratio: 6.94
EWDT: 289 ms
FS: 35 % (ref 28–44)
Height: 72 in
IV/PV OW: 1.4
LA vol A4C: 46.4 ml
LA vol index: 22.3 mL/m2
LA vol: 49.8 mL
LADIAMINDEX: 1.93 cm/m2
LASIZE: 43 mm
LDCA: 2.54 cm2
LEFT ATRIUM END SYS DIAM: 43 mm
LV E/e'average: 6.94
LV PW d: 10 mm — AB (ref 0.6–1.1)
LV TDI E'LATERAL: 5.91
LV TDI E'MEDIAL: 4.11
LVEEMED: 6.94
LVELAT: 5.91 cm/s
LVOT peak grad rest: 3 mmHg
LVOTD: 18 mm
LVOTPV: 88.6 cm/s
LVOTSV: 52 mL
Lateral S' vel: 10.1 cm/s
MV pk A vel: 79.3 m/s
MVPKEVEL: 41 m/s
P 1/2 time: 85 ms
TAPSE: 16.3 mm
Weight: 3388.8 oz

## 2017-01-11 LAB — PULMONARY FUNCTION TEST
DL/VA % PRED: 124 %
DL/VA: 5.84 ml/min/mmHg/L
DLCO COR % PRED: 85 %
DLCO COR: 29.82 ml/min/mmHg
DLCO unc % pred: 88 %
DLCO unc: 31.16 ml/min/mmHg
FEF 25-75 POST: 5.17 L/s
FEF 25-75 Pre: 4.55 L/sec
FEF2575-%CHANGE-POST: 13 %
FEF2575-%PRED-POST: 189 %
FEF2575-%PRED-PRE: 166 %
FEV1-%Change-Post: 2 %
FEV1-%Pred-Post: 95 %
FEV1-%Pred-Pre: 92 %
FEV1-POST: 3.01 L
FEV1-Pre: 2.93 L
FEV1FVC-%CHANGE-POST: -1 %
FEV1FVC-%PRED-PRE: 117 %
FEV6-%CHANGE-POST: 4 %
FEV6-%Pred-Post: 85 %
FEV6-%Pred-Pre: 81 %
FEV6-Post: 3.39 L
FEV6-Pre: 3.24 L
FEV6FVC-%Pred-Post: 104 %
FEV6FVC-%Pred-Pre: 104 %
FVC-%Change-Post: 4 %
FVC-%PRED-POST: 81 %
FVC-%PRED-PRE: 77 %
FVC-POST: 3.39 L
FVC-PRE: 3.24 L
POST FEV1/FVC RATIO: 89 %
PRE FEV1/FVC RATIO: 90 %
Post FEV6/FVC ratio: 100 %
Pre FEV6/FVC Ratio: 100 %
RV % pred: 77 %
RV: 1.97 L
TLC % PRED: 73 %
TLC: 5.46 L

## 2017-01-11 LAB — BLOOD GAS, ARTERIAL
Acid-Base Excess: 1 mmol/L (ref 0.0–2.0)
Bicarbonate: 24.7 mmol/L (ref 20.0–28.0)
DRAWN BY: 11249
FIO2: 21
O2 Saturation: 94.9 %
PATIENT TEMPERATURE: 98.8
pCO2 arterial: 36.9 mmHg (ref 32.0–48.0)
pH, Arterial: 7.441 (ref 7.350–7.450)
pO2, Arterial: 74.3 mmHg — ABNORMAL LOW (ref 83.0–108.0)

## 2017-01-11 LAB — COMPREHENSIVE METABOLIC PANEL
ALBUMIN: 3.2 g/dL — AB (ref 3.5–5.0)
ALT: 19 U/L (ref 17–63)
AST: 15 U/L (ref 15–41)
Alkaline Phosphatase: 66 U/L (ref 38–126)
Anion gap: 6 (ref 5–15)
BUN: 17 mg/dL (ref 6–20)
CHLORIDE: 102 mmol/L (ref 101–111)
CO2: 25 mmol/L (ref 22–32)
Calcium: 8.6 mg/dL — ABNORMAL LOW (ref 8.9–10.3)
Creatinine, Ser: 1.19 mg/dL (ref 0.61–1.24)
GFR calc Af Amer: >60 mL/min (ref >60)
GLUCOSE: 133 mg/dL — AB (ref 65–99)
POTASSIUM: 4.1 mmol/L (ref 3.5–5.1)
SODIUM: 133 mmol/L — AB (ref 135–145)
Total Bilirubin: 1 mg/dL (ref 0.3–1.2)
Total Protein: 6.1 g/dL — ABNORMAL LOW (ref 6.5–8.1)

## 2017-01-11 LAB — TYPE AND SCREEN
ABO/RH(D): A POS
Antibody Screen: NEGATIVE

## 2017-01-11 LAB — VAS US DOPPLER PRE CABG
LCCADSYS: -96 cm/s
LCCAPDIAS: 11 cm/s
LEFT ECA DIAS: -8 cm/s
LEFT VERTEBRAL DIAS: -11 cm/s
LICADDIAS: -26 cm/s
LICAPDIAS: -22 cm/s
LICAPSYS: -72 cm/s
Left CCA dist dias: -14 cm/s
Left CCA prox sys: 129 cm/s
Left ICA dist sys: -83 cm/s
RCCAPDIAS: 8 cm/s
RCCAPSYS: 116 cm/s
RIGHT ECA DIAS: -9 cm/s
RIGHT VERTEBRAL DIAS: -12 cm/s
Right cca dist sys: -91 cm/s

## 2017-01-11 LAB — ABO/RH: ABO/RH(D): A POS

## 2017-01-11 LAB — HEPARIN LEVEL (UNFRACTIONATED): Heparin Unfractionated: 0.69 IU/mL (ref 0.30–0.70)

## 2017-01-11 LAB — HEMOGLOBIN A1C
Hgb A1c MFr Bld: 6.3 % — ABNORMAL HIGH (ref 4.8–5.6)
MEAN PLASMA GLUCOSE: 134 mg/dL

## 2017-01-11 MED ORDER — EPINEPHRINE PF 1 MG/ML IJ SOLN
0.0000 ug/min | INTRAVENOUS | Status: DC
  Filled 2017-01-11: qty 4

## 2017-01-11 MED ORDER — BISACODYL 5 MG PO TBEC
5.0000 mg | DELAYED_RELEASE_TABLET | Freq: Once | ORAL | Status: AC
  Administered 2017-01-11: 5 mg via ORAL
  Filled 2017-01-11: qty 1

## 2017-01-11 MED ORDER — TRANEXAMIC ACID (OHS) BOLUS VIA INFUSION
15.0000 mg/kg | INTRAVENOUS | Status: DC
  Administered 2017-01-12: 1441.5 mg via INTRAVENOUS
  Filled 2017-01-11: qty 1442

## 2017-01-11 MED ORDER — NITROGLYCERIN IN D5W 200-5 MCG/ML-% IV SOLN
2.0000 ug/min | INTRAVENOUS | Status: DC
  Filled 2017-01-11 (×2): qty 250

## 2017-01-11 MED ORDER — MAGNESIUM SULFATE 50 % IJ SOLN
40.0000 meq | INTRAMUSCULAR | Status: DC
  Filled 2017-01-11: qty 10

## 2017-01-11 MED ORDER — CHLORHEXIDINE GLUCONATE 4 % EX LIQD
60.0000 mL | Freq: Once | CUTANEOUS | Status: AC
  Administered 2017-01-11: 4 via TOPICAL
  Filled 2017-01-11: qty 60

## 2017-01-11 MED ORDER — METOCLOPRAMIDE HCL 5 MG PO TABS: 5.0000 mg | ORAL_TABLET | Freq: Four times a day (QID) | ORAL | Status: DC | PRN

## 2017-01-11 MED ORDER — PERFLUTREN LIPID MICROSPHERE
INTRAVENOUS | Status: AC
  Filled 2017-01-11: qty 10

## 2017-01-11 MED ORDER — PLASMA-LYTE 148 IV SOLN
INTRAVENOUS | Status: AC
  Administered 2017-01-12: 500 mL
  Filled 2017-01-11: qty 2.5

## 2017-01-11 MED ORDER — TRANEXAMIC ACID (OHS) PUMP PRIME SOLUTION
2.0000 mg/kg | INTRAVENOUS | Status: DC
Start: 2017-01-12 — End: 2017-01-12
  Filled 2017-01-11: qty 1.92

## 2017-01-11 MED ORDER — VANCOMYCIN HCL 10 G IV SOLR
1250.0000 mg | INTRAVENOUS | Status: DC
  Administered 2017-01-12: 1250 mg via INTRAVENOUS
  Filled 2017-01-11: qty 1250

## 2017-01-11 MED ORDER — DEXTROSE 5 % IV SOLN
1.5000 g | INTRAVENOUS | Status: DC
  Administered 2017-01-12: .75 g via INTRAVENOUS
  Administered 2017-01-12: 1.5 g via INTRAVENOUS
  Filled 2017-01-11: qty 1.5

## 2017-01-11 MED ORDER — POTASSIUM CHLORIDE 2 MEQ/ML IV SOLN
80.0000 meq | INTRAVENOUS | Status: DC
  Filled 2017-01-11: qty 40

## 2017-01-11 MED ORDER — METOPROLOL TARTRATE 12.5 MG HALF TABLET
12.5000 mg | ORAL_TABLET | Freq: Once | ORAL | Status: AC
  Administered 2017-01-12: 12.5 mg via ORAL
  Filled 2017-01-11: qty 1

## 2017-01-11 MED ORDER — DEXTROSE 5 % IV SOLN
750.0000 mg | INTRAVENOUS | Status: DC
  Filled 2017-01-11: qty 750

## 2017-01-11 MED ORDER — SODIUM CHLORIDE 0.9 % IV SOLN
INTRAVENOUS | Status: DC
  Administered 2017-01-12: 1.1 [IU]/h via INTRAVENOUS
  Filled 2017-01-11: qty 1

## 2017-01-11 MED ORDER — DEXMEDETOMIDINE HCL IN NACL 400 MCG/100ML IV SOLN
0.1000 ug/kg/h | INTRAVENOUS | Status: DC
  Administered 2017-01-12: .3 ug/kg/h via INTRAVENOUS
  Filled 2017-01-11: qty 100

## 2017-01-11 MED ORDER — TEMAZEPAM 15 MG PO CAPS: 15.0000 mg | ORAL_CAPSULE | Freq: Once | ORAL | Status: DC | PRN

## 2017-01-11 MED ORDER — DOPAMINE-DEXTROSE 3.2-5 MG/ML-% IV SOLN
0.0000 ug/kg/min | INTRAVENOUS | Status: DC
  Filled 2017-01-11 (×2): qty 250

## 2017-01-11 MED ORDER — ALBUTEROL SULFATE (2.5 MG/3ML) 0.083% IN NEBU
2.5000 mg | INHALATION_SOLUTION | Freq: Once | RESPIRATORY_TRACT | Status: AC
Start: 2017-01-11 — End: 2017-01-11
  Administered 2017-01-11: 2.5 mg via RESPIRATORY_TRACT

## 2017-01-11 MED ORDER — CHLORHEXIDINE GLUCONATE 0.12 % MT SOLN
15.0000 mL | Freq: Once | OROMUCOSAL | Status: AC
  Administered 2017-01-12: 15 mL via OROMUCOSAL
  Filled 2017-01-11: qty 15

## 2017-01-11 MED ORDER — VANCOMYCIN HCL 1000 MG IV SOLR
INTRAVENOUS | Status: DC
  Filled 2017-01-11: qty 1000

## 2017-01-11 MED ORDER — SODIUM CHLORIDE 0.9 % IV SOLN
30.0000 ug/min | INTRAVENOUS | Status: DC
  Filled 2017-01-11: qty 2

## 2017-01-11 MED ORDER — SODIUM CHLORIDE 0.9 % IV SOLN
INTRAVENOUS | Status: DC
  Filled 2017-01-11: qty 30

## 2017-01-11 MED ORDER — CHLORHEXIDINE GLUCONATE 4 % EX LIQD
60.0000 mL | Freq: Once | CUTANEOUS | Status: AC
  Administered 2017-01-12: 4 via TOPICAL
  Filled 2017-01-11: qty 60

## 2017-01-11 MED ORDER — TRANEXAMIC ACID 1000 MG/10ML IV SOLN
1.5000 mg/kg/h | INTRAVENOUS | Status: DC
  Administered 2017-01-12: 1.5 mg/kg/h via INTRAVENOUS
  Filled 2017-01-11: qty 25

## 2017-01-11 NOTE — Progress Notes (Signed)
  Echocardiogram 2D Echocardiogram has been performed.  Pattiann Solanki G Dejia Ebron 01/11/2017, 2:00 PM

## 2017-01-11 NOTE — Progress Notes (Signed)
      [address]       [name] [id number]             [phone]     CARDIOTHORACIC SURGERY PROGRESS NOTE  Subjective: No complaints.  No chest pain, SOB  Objective: Vital signs in last 24 hours: Temp:  [98.5 F (36.9 C)] 98.5 F (36.9 C) (07/18 0549) Pulse Rate:  [64-68] 68 (07/18 0549) Cardiac Rhythm: Normal sinus rhythm (07/18 0701) Resp:  [17-19] 17 (07/18 0549) BP: (137-147)/(82-95) 137/82 (07/18 0549) SpO2:  [100 %] 100 % (07/18 0549) Weight:  [211 lb 12.8 oz (96.1 kg)] 211 lb 12.8 oz (96.1 kg) (07/18 0549)  Physical Exam:  Rhythm:   sinus  Breath sounds: clear  Heart sounds:  RRR  Incisions:  n/a  Abdomen:  soft  Extremities:  warm   Intake/Output from previous day: 07/17 0701 - 07/18 0700 In: 1478 [P.O.:1320; I.V.:158] Out: 1925 [Urine:1925] Intake/Output this shift: No intake/output data recorded.  Lab Results:  Recent Labs  01/10/17 0140 01/11/17 0231  WBC 9.6 9.3  HGB 15.9 16.3  HCT 48.0 48.4  PLT 182 186   BMET:  Recent Labs  01/10/17 0140 01/11/17 0231  NA 133* 133*  K 4.0 4.1  CL 105 102  CO2 23 25  GLUCOSE 151* 133*  BUN 21* 17  CREATININE 1.19 1.19  CALCIUM 8.4* 8.6*    CBG (last 3)  No results for input(s): GLUCAP in the last 72 hours. PT/INR:   Recent Labs  01/09/17 1511  LABPROT 13.6  INR 1.04    CXR:  CHEST  2 VIEW  COMPARISON:  None.  FINDINGS: The heart size and mediastinal contours are within normal limits. Both lungs are clear. Degenerative changes of the AC joints and spine.  IMPRESSION: No active cardiopulmonary disease.   Electronically Signed   By: Jasmine PangKim  Fujinaga M.D.   On: 01/06/2017 02:40   Assessment/Plan: S/P Procedure(s) (LRB): CORONARY ARTERY BYPASS GRAFTING (CABG) (N/A) INTRAOPERATIVE TRANSESOPHAGEAL ECHOCARDIOGRAM (N/A)  Clinically stable For OR tomorrow All questions answered  Purcell Nailslarence H Owen, MD 01/11/2017 3:20 PM

## 2017-01-11 NOTE — Progress Notes (Addendum)
Progress Note  Patient Name: Dale DollarOtis Wilbert Pescador Jr. Date of Encounter: 01/11/2017  Primary Cardiologist: Dr. Kirke CorinArida   Subjective   Currently CP free. No dyspnea. No complaints. Family by bedside.   Inpatient Medications    Scheduled Meds: . aspirin EC  81 mg Oral Daily  . atorvastatin  40 mg Oral q1800  . bisacodyl  5 mg Oral Once  . chlorhexidine  60 mL Topical Once   And  . [START ON 01/12/2017] chlorhexidine  60 mL Topical Once  . [START ON 01/12/2017] chlorhexidine  15 mL Mouth/Throat Once  . [START ON 01/12/2017] metoprolol tartrate  12.5 mg Oral Once  . metoprolol tartrate  50 mg Oral BID  . sodium chloride flush  3 mL Intravenous Q12H   Continuous Infusions: . sodium chloride    . heparin 1,350 Units/hr (01/10/17 1418)   PRN Meds: sodium chloride, acetaminophen, nitroGLYCERIN, ondansetron (ZOFRAN) IV, sodium chloride flush, temazepam   Vital Signs    Vitals:   01/10/17 1203 01/10/17 1300 01/10/17 2007 01/11/17 0549  BP:   (!) 147/95 137/82  Pulse: 70 70 64 68  Resp:  20 19 17   Temp:  98.1 F (36.7 C) 98.5 F (36.9 C) 98.5 F (36.9 C)  TempSrc:  Oral Oral Oral  SpO2:  97% 100% 100%  Weight:    211 lb 12.8 oz (96.1 kg)  Height:        Intake/Output Summary (Last 24 hours) at 01/11/17 0826 Last data filed at 01/11/17 0551  Gross per 24 hour  Intake          1117.95 ml  Output             1925 ml  Net          -807.05 ml   Filed Weights   01/10/17 0607 01/10/17 1012 01/11/17 0549  Weight: 213 lb 6.4 oz (96.8 kg) 213 lb 8 oz (96.8 kg) 211 lb 12.8 oz (96.1 kg)    Telemetry    NSR - Personally Reviewed  ECG    NSR - Personally Reviewed  Physical Exam   GEN: No acute distress.   Neck: No JVD Cardiac: RRR, no murmurs, rubs, or gallops.  Respiratory: Clear to auscultation bilaterally. GI: Soft, nontender, non-distended  MS: No edema; No deformity. Neuro:  Nonfocal  Psych: Normal affect   Labs    Chemistry  Recent Labs Lab  01/05/17 1530 01/10/17 0140 01/11/17 0231  NA 137 133* 133*  K 4.6 4.0 4.1  CL 102 105 102  CO2 25 23 25   GLUCOSE 142* 151* 133*  BUN 32* 21* 17  CREATININE 1.41* 1.19 1.19  CALCIUM 9.4 8.4* 8.6*  PROT  --   --  6.1*  ALBUMIN  --   --  3.2*  AST  --   --  15  ALT  --   --  19  ALKPHOS  --   --  66  BILITOT  --   --  1.0  GFRNONAA 50* >60 >60  GFRAA 58* >60 >60  ANIONGAP 10 5 6      Hematology  Recent Labs Lab 01/09/17 1511 01/10/17 0140 01/11/17 0231  WBC 10.9* 9.6 9.3  RBC 5.79 5.53 5.65  HGB 16.7 15.9 16.3  HCT 49.4 48.0 48.4  MCV 85.2 86.8 85.7  MCH 28.9 28.8 28.8  MCHC 33.9 33.1 33.7  RDW 14.4 13.8 13.9  PLT 209 182 186    Cardiac EnzymesNo results for input(s): TROPONINI in  the last 168 hours. No results for input(s): TROPIPOC in the last 168 hours.   BNPNo results for input(s): BNP, PROBNP in the last 168 hours.   DDimer No results for input(s): DDIMER in the last 168 hours.   Radiology    No results found.  Cardiac Studies   Procedures   Left Heart Cath and Coronary Angiography  Conclusion     Prox Cx to Mid Cx lesion, 80 %stenosed.  Ost 1st Mrg to 1st Mrg lesion, 60 %stenosed.  Mid Cx lesion, 100 %stenosed.  Ost LAD to Prox LAD lesion, 95 %stenosed.  Mid LAD lesion, 60 %stenosed.  Dist LAD-2 lesion, 60 %stenosed.  Dist LAD-1 lesion, 90 %stenosed.  Ost 2nd Mrg to 2nd Mrg lesion, 90 %stenosed.  Prox RCA to Mid RCA lesion, 99 %stenosed.  The left ventricular systolic function is normal.  LV end diastolic pressure is normal.  The left ventricular ejection fraction is 55-65% by visual estimate.   1. 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 2. Normal LV systolic function and high normal left ventricular end-diastolic pressure.     Patient Profile     71 year old male without prior history of coronary artery disease, who underwent diagnostic  catheterization 01/09/17 at [facility], revealing severe multivessel disease requiring transfer to [name].  Assessment & Plan    1. CAD: LHC 01/09/17 showed severe 3V CAD (angiographic details outlined above). EF normal. Evaluated by Dr. Cornelius Moras with [facility] and has plans for CABG on Thursday 01/12/17.  Continue IV heparin, ASA, statin and BB. He is chest pain free.   2. Dyslipidemia: LDL at goal at 67 mg/dL, however HDL is low at 29 and TG slightly elevated at 154. Continue Lipitor 40 mg.   3. HTN: controlled on current regimen. Continue metoprolol.   4. Hyperglycemia: No prior h/o DM noted in record. Hgb A1c 6.3. Is in pre-diabetes range. May need glycemic control peri-operatively.     Signed, Berton Bon, NP  01/11/2017, 8:26 AM    I have seen and examined the patient along with Berton Bon, NP.  I have reviewed the chart, notes and new data.  I agree with NP's note.  Key new complaints: no angina Key examination changes: no overt hypervolemia Key new findings / data: mixed hyperlipidemia, low HDL, borderline DM all consistent with the metabolic sd/insulin resistance.   PLAN: CABG in AM. After surgery, work hard on diet/exercise/weight loss.  Thurmon Fair, MD, [facility] [phone] 01/11/2017, 10:43 AM

## 2017-01-11 NOTE — Progress Notes (Signed)
ANTICOAGULATION CONSULT NOTE - Follow Up Consult  Pharmacy Consult:  Heparin Indication: ACS s/p cath  No Known Allergies  Patient Measurements: Height: 6' (182.9 cm) Weight: 211 lb 12.8 oz (96.1 kg) IBW/kg (Calculated) : 77.6 Heparin Dosing Weight: 97 kg  Vital Signs: Temp: 98.5 F (36.9 C) (07/18 0549) Temp Source: Oral (07/18 0549) BP: 137/82 (07/18 0549) Pulse Rate: 68 (07/18 0549)  Labs:  Recent Labs  01/09/17 1511  01/10/17 0140 01/10/17 0821 01/10/17 1033 01/11/17 0231  HGB 16.7  --  15.9  --   --  16.3  HCT 49.4  --  48.0  --   --  48.4  PLT 209  --  182  --   --  186  APTT 29  --   --   --   --   --   LABPROT 13.6  --   --   --   --   --   INR 1.04  --   --   --   --   --   HEPARINUNFRC  --   < > 0.31 0.75* 0.61 0.69  CREATININE  --   --  1.19  --   --  1.19  < > = values in this interval not displayed.  Estimated Creatinine Clearance: 71.4 mL/min (by C-G formula based on SCr of 1.19 mg/dL).    Assessment: [age] [facility] showed multivessel [facility].  [facility]  Pharmacy consulted to continue IV heparin in the meantime.  Heparin level is therapeutic and toward the high end of normal even after reduction to 1350 units/hr.  No bleeding reported.   Goal of Therapy:  Heparin level 0.3-0.7 units/ml Monitor platelets by anticoagulation protocol: Yes    Plan:  Reduce heparin gtt to 1300 units/hr.  Daily heparin level and CBC while on therapy Heparin to stop at Midnight for CABG on 7/19 F/U post-[facility] 7/19   Link SnufferJessica Leamon Palau, PharmD, BCPS Clinical Pharmacist Clinical Phone 01/11/2017 until 3:30 PM - [phone] After hours, please call #[phone] 01/11/2017, 9:57 AM

## 2017-01-11 NOTE — Progress Notes (Signed)
Pre-op Cardiac Surgery  Carotid Findings:  No evidence of extracranial ICA stenosis. Vertebral artery flow is antegrade  Upper Extremity Right Left  Brachial Pressures 137 Triphaic 148 Triphasic  Radial Waveforms Triphasic Triphasic  Ulnar Waveforms Triphasic Triphasic  Palmar Arch (Allen's Test) Abnormal Normal   Findings:  Right - Doppler waveforms obliterate with radial compression and remain normal with ulnar compression. Left - Doppler waveforms remain normal with both radial and ulnar compression.    Lower  Extremity Right Left  Dorsalis Pedis 153 Triphasic   Anterior Tibial  167 Triphasic  Posterior Tibial 173 Triphasic 163 Triphasic  Ankle/Brachial Indices 1.17 1.13    Findings:  ABIs and Doppler waveforms indicate normal arterial flow at rest.  Toma DeitersVirginia Dresean Beckel, RVS 01/11/2017 11:12 AM

## 2017-01-11 NOTE — Progress Notes (Signed)
CARDIAC REHAB PHASE I   PRE:  Rate/Rhythm: 79 SR  BP:  Sitting: 157/82        SaO2: 98 RA  MODE:  Ambulation: 550 ft   POST:  Rate/Rhythm: 91 SR  BP:  Sitting: 149/97         SaO2: 98 RA  Pt ambulated 550 ft on RA, IV, independent, steady gait, tolerated well with no complaints. Pt lives alone, upstairs, states he will not have 24 hr support at discharge, may need SNF upon d/c. Completed cardiac surgery pre-op education. Reviewed IS, sternal precautions, activity progression, cardiac surgery booklet and cardiac surgery guidelines. Pt verbalized understanding, declined cardiac surgery videos. Pt to edge of bed after walk per pt request, call bell within reach. Will follow post-op.   [id number] Joylene GrapesEmily C Munira Polson, RN, BSN 01/11/2017 2:23 PM

## 2017-01-12 ENCOUNTER — Inpatient Hospital Stay (HOSPITAL_COMMUNITY): Payer: Medicare PPO

## 2017-01-12 ENCOUNTER — Inpatient Hospital Stay (HOSPITAL_COMMUNITY): Payer: Medicare PPO | Admitting: Certified Registered Nurse Anesthetist

## 2017-01-12 ENCOUNTER — Encounter (HOSPITAL_COMMUNITY)
Admission: AD | Disposition: A | Discharge status: Home Health | Payer: Self-pay | Source: Other Acute Inpatient Hospital | Attending: Thoracic Surgery (Cardiothoracic Vascular Surgery)

## 2017-01-12 ENCOUNTER — Encounter (HOSPITAL_COMMUNITY): Payer: Self-pay | Admitting: Thoracic Surgery (Cardiothoracic Vascular Surgery)

## 2017-01-12 DIAGNOSIS — Z951 Presence of aortocoronary bypass graft: Secondary | ICD-10-CM

## 2017-01-12 DIAGNOSIS — I25110 Atherosclerotic heart disease of native coronary artery with unstable angina pectoris: Secondary | ICD-10-CM

## 2017-01-12 HISTORY — PX: CORONARY ARTERY BYPASS GRAFT: SHX141

## 2017-01-12 HISTORY — PX: INTRAOPERATIVE TRANSESOPHAGEAL ECHOCARDIOGRAM: SHX5062

## 2017-01-12 LAB — POCT I-STAT, CHEM 8
BUN: 16 mg/dL (ref 6–20)
BUN: 13 mg/dL (ref 6–20)
BUN: 15 mg/dL (ref 6–20)
BUN: 13 mg/dL (ref 6–20)
BUN: 14 mg/dL (ref 6–20)
BUN: 13 mg/dL (ref 6–20)
BUN: 12 mg/dL (ref 6–20)
CALCIUM ION: 1.26 mmol/L (ref 1.15–1.40)
CALCIUM ION: 0.97 mmol/L — AB (ref 1.15–1.40)
CALCIUM ION: 1 mmol/L — AB (ref 1.15–1.40)
CHLORIDE: 99 mmol/L — AB (ref 101–111)
CHLORIDE: 102 mmol/L (ref 101–111)
CHLORIDE: 96 mmol/L — AB (ref 101–111)
CHLORIDE: 97 mmol/L — AB (ref 101–111)
CHLORIDE: 99 mmol/L — AB (ref 101–111)
CHLORIDE: 104 mmol/L (ref 101–111)
CREATININE: 0.9 mg/dL (ref 0.61–1.24)
CREATININE: 1 mg/dL (ref 0.61–1.24)
Calcium, Ion: 1.18 mmol/L (ref 1.15–1.40)
Calcium, Ion: 1.02 mmol/L — ABNORMAL LOW (ref 1.15–1.40)
Calcium, Ion: 0.97 mmol/L — ABNORMAL LOW (ref 1.15–1.40)
Calcium, Ion: 1.07 mmol/L — ABNORMAL LOW (ref 1.15–1.40)
Chloride: 100 mmol/L — ABNORMAL LOW (ref 101–111)
Creatinine, Ser: 1.1 mg/dL (ref 0.61–1.24)
Creatinine, Ser: 0.8 mg/dL (ref 0.61–1.24)
Creatinine, Ser: 1 mg/dL (ref 0.61–1.24)
Creatinine, Ser: 0.9 mg/dL (ref 0.61–1.24)
Creatinine, Ser: 0.8 mg/dL (ref 0.61–1.24)
GLUCOSE: 117 mg/dL — AB (ref 65–99)
GLUCOSE: 132 mg/dL — AB (ref 65–99)
GLUCOSE: 129 mg/dL — AB (ref 65–99)
Glucose, Bld: 104 mg/dL — ABNORMAL HIGH (ref 65–99)
Glucose, Bld: 116 mg/dL — ABNORMAL HIGH (ref 65–99)
Glucose, Bld: 134 mg/dL — ABNORMAL HIGH (ref 65–99)
Glucose, Bld: 139 mg/dL — ABNORMAL HIGH (ref 65–99)
HCT: 48 % (ref 39.0–52.0)
HCT: 32 % — ABNORMAL LOW (ref 39.0–52.0)
HCT: 33 % — ABNORMAL LOW (ref 39.0–52.0)
HCT: 37 % — ABNORMAL LOW (ref 39.0–52.0)
HEMATOCRIT: 43 % (ref 39.0–52.0)
HEMATOCRIT: 33 % — AB (ref 39.0–52.0)
HEMATOCRIT: 33 % — AB (ref 39.0–52.0)
HEMOGLOBIN: 11.2 g/dL — AB (ref 13.0–17.0)
HEMOGLOBIN: 11.2 g/dL — AB (ref 13.0–17.0)
Hemoglobin: 16.3 g/dL (ref 13.0–17.0)
Hemoglobin: 10.9 g/dL — ABNORMAL LOW (ref 13.0–17.0)
Hemoglobin: 14.6 g/dL (ref 13.0–17.0)
Hemoglobin: 11.2 g/dL — ABNORMAL LOW (ref 13.0–17.0)
Hemoglobin: 12.6 g/dL — ABNORMAL LOW (ref 13.0–17.0)
POTASSIUM: 4.7 mmol/L (ref 3.5–5.1)
POTASSIUM: 5.1 mmol/L (ref 3.5–5.1)
POTASSIUM: 6.1 mmol/L — AB (ref 3.5–5.1)
POTASSIUM: 5 mmol/L (ref 3.5–5.1)
Potassium: 5 mmol/L (ref 3.5–5.1)
Potassium: 6.1 mmol/L — ABNORMAL HIGH (ref 3.5–5.1)
Potassium: 5.4 mmol/L — ABNORMAL HIGH (ref 3.5–5.1)
SODIUM: 133 mmol/L — AB (ref 135–145)
SODIUM: 133 mmol/L — AB (ref 135–145)
SODIUM: 136 mmol/L (ref 135–145)
Sodium: 138 mmol/L (ref 135–145)
Sodium: 137 mmol/L (ref 135–145)
Sodium: 135 mmol/L (ref 135–145)
Sodium: 139 mmol/L (ref 135–145)
TCO2: 28 mmol/L (ref 0–100)
TCO2: 27 mmol/L (ref 0–100)
TCO2: 29 mmol/L (ref 0–100)
TCO2: 27 mmol/L (ref 0–100)
TCO2: 30 mmol/L (ref 0–100)
TCO2: 25 mmol/L (ref 0–100)
TCO2: 25 mmol/L (ref 0–100)

## 2017-01-12 LAB — POCT I-STAT 3, ART BLOOD GAS (G3+)
ACID-BASE DEFICIT: 2 mmol/L (ref 0.0–2.0)
ACID-BASE EXCESS: 4 mmol/L — AB (ref 0.0–2.0)
ACID-BASE EXCESS: 6 mmol/L — AB (ref 0.0–2.0)
Acid-Base Excess: 4 mmol/L — ABNORMAL HIGH (ref 0.0–2.0)
Acid-Base Excess: 5 mmol/L — ABNORMAL HIGH (ref 0.0–2.0)
Acid-Base Excess: 5 mmol/L — ABNORMAL HIGH (ref 0.0–2.0)
Acid-base deficit: 3 mmol/L — ABNORMAL HIGH (ref 0.0–2.0)
Acid-base deficit: 4 mmol/L — ABNORMAL HIGH (ref 0.0–2.0)
Acid-base deficit: 2 mmol/L (ref 0.0–2.0)
BICARBONATE: 27.9 mmol/L (ref 20.0–28.0)
BICARBONATE: 22.4 mmol/L (ref 20.0–28.0)
BICARBONATE: 23.4 mmol/L (ref 20.0–28.0)
BICARBONATE: 23.9 mmol/L (ref 20.0–28.0)
BICARBONATE: 24.1 mmol/L (ref 20.0–28.0)
Bicarbonate: 28.2 mmol/L — ABNORMAL HIGH (ref 20.0–28.0)
Bicarbonate: 29 mmol/L — ABNORMAL HIGH (ref 20.0–28.0)
Bicarbonate: 28.6 mmol/L — ABNORMAL HIGH (ref 20.0–28.0)
Bicarbonate: 29 mmol/L — ABNORMAL HIGH (ref 20.0–28.0)
O2 SAT: 100 %
O2 SAT: 100 %
O2 SAT: 100 %
O2 Saturation: 100 %
O2 Saturation: 100 %
O2 Saturation: 96 %
O2 Saturation: 95 %
O2 Saturation: 95 %
O2 Saturation: 92 %
PCO2 ART: 37.4 mmHg (ref 32.0–48.0)
PCO2 ART: 45.9 mmHg (ref 32.0–48.0)
PCO2 ART: 38.7 mmHg (ref 32.0–48.0)
PCO2 ART: 36.7 mmHg (ref 32.0–48.0)
PCO2 ART: 50.7 mmHg — AB (ref 32.0–48.0)
PH ART: 7.476 — AB (ref 7.350–7.450)
PH ART: 7.307 — AB (ref 7.350–7.450)
PO2 ART: 416 mmHg — AB (ref 83.0–108.0)
PO2 ART: 80 mmHg — AB (ref 83.0–108.0)
PO2 ART: 91 mmHg (ref 83.0–108.0)
PO2 ART: 76 mmHg — AB (ref 83.0–108.0)
Patient temperature: 35.8
Patient temperature: 37.1
Patient temperature: 38
TCO2: 29 mmol/L (ref 0–100)
TCO2: 30 mmol/L (ref 0–100)
TCO2: 29 mmol/L (ref 0–100)
TCO2: 30 mmol/L (ref 0–100)
TCO2: 30 mmol/L (ref 0–100)
TCO2: 24 mmol/L (ref 0–100)
TCO2: 25 mmol/L (ref 0–100)
TCO2: 25 mmol/L (ref 0–100)
TCO2: 25 mmol/L (ref 0–100)
pCO2 arterial: 35.2 mmHg (ref 32.0–48.0)
pCO2 arterial: 39.2 mmHg (ref 32.0–48.0)
pCO2 arterial: 48.4 mmHg — ABNORMAL HIGH (ref 32.0–48.0)
pCO2 arterial: 49.1 mmHg — ABNORMAL HIGH (ref 32.0–48.0)
pH, Arterial: 7.409 (ref 7.350–7.450)
pH, Arterial: 7.486 — ABNORMAL HIGH (ref 7.350–7.450)
pH, Arterial: 7.459 — ABNORMAL HIGH (ref 7.350–7.450)
pH, Arterial: 7.524 — ABNORMAL HIGH (ref 7.350–7.450)
pH, Arterial: 7.388 (ref 7.350–7.450)
pH, Arterial: 7.272 — ABNORMAL LOW (ref 7.350–7.450)
pH, Arterial: 7.304 — ABNORMAL LOW (ref 7.350–7.450)
pO2, Arterial: 363 mmHg — ABNORMAL HIGH (ref 83.0–108.0)
pO2, Arterial: 270 mmHg — ABNORMAL HIGH (ref 83.0–108.0)
pO2, Arterial: 318 mmHg — ABNORMAL HIGH (ref 83.0–108.0)
pO2, Arterial: 390 mmHg — ABNORMAL HIGH (ref 83.0–108.0)
pO2, Arterial: 86 mmHg (ref 83.0–108.0)

## 2017-01-12 LAB — BASIC METABOLIC PANEL
ANION GAP: 6 (ref 5–15)
BUN: 15 mg/dL (ref 6–20)
CALCIUM: 8.8 mg/dL — AB (ref 8.9–10.3)
CO2: 25 mmol/L (ref 22–32)
Chloride: 102 mmol/L (ref 101–111)
Creatinine, Ser: 1.21 mg/dL (ref 0.61–1.24)
GFR calc Af Amer: >60 mL/min (ref >60)
GFR calc non Af Amer: 60 mL/min — ABNORMAL LOW (ref >60)
GLUCOSE: 134 mg/dL — AB (ref 65–99)
POTASSIUM: 4.4 mmol/L (ref 3.5–5.1)
SODIUM: 133 mmol/L — AB (ref 135–145)

## 2017-01-12 LAB — CREATININE, SERUM
CREATININE: 1.05 mg/dL (ref 0.61–1.24)
GFR calc non Af Amer: >60 mL/min (ref >60)

## 2017-01-12 LAB — CBC
HCT: 47 % (ref 39.0–52.0)
HCT: 37.3 % — ABNORMAL LOW (ref 39.0–52.0)
HCT: 37.7 % — ABNORMAL LOW (ref 39.0–52.0)
HEMOGLOBIN: 12.5 g/dL — AB (ref 13.0–17.0)
Hemoglobin: 16.1 g/dL (ref 13.0–17.0)
Hemoglobin: 12.5 g/dL — ABNORMAL LOW (ref 13.0–17.0)
MCH: 29.3 pg (ref 26.0–34.0)
MCH: 28.5 pg (ref 26.0–34.0)
MCH: 28.5 pg (ref 26.0–34.0)
MCHC: 34.3 g/dL (ref 30.0–36.0)
MCHC: 33.5 g/dL (ref 30.0–36.0)
MCHC: 33.2 g/dL (ref 30.0–36.0)
MCV: 85.5 fL (ref 78.0–100.0)
MCV: 85.2 fL (ref 78.0–100.0)
MCV: 86.1 fL (ref 78.0–100.0)
PLATELETS: 182 10*3/uL (ref 150–400)
PLATELETS: 111 10*3/uL — AB (ref 150–400)
PLATELETS: 127 10*3/uL — AB (ref 150–400)
RBC: 5.5 MIL/uL (ref 4.22–5.81)
RBC: 4.38 MIL/uL (ref 4.22–5.81)
RBC: 4.38 MIL/uL (ref 4.22–5.81)
RDW: 13.5 % (ref 11.5–15.5)
RDW: 13.4 % (ref 11.5–15.5)
RDW: 13.6 % (ref 11.5–15.5)
WBC: 9.8 10*3/uL (ref 4.0–10.5)
WBC: 13.6 10*3/uL — AB (ref 4.0–10.5)
WBC: 17.6 10*3/uL — AB (ref 4.0–10.5)

## 2017-01-12 LAB — GLUCOSE, CAPILLARY
GLUCOSE-CAPILLARY: 117 mg/dL — AB (ref 65–99)
GLUCOSE-CAPILLARY: 133 mg/dL — AB (ref 65–99)
GLUCOSE-CAPILLARY: 126 mg/dL — AB (ref 65–99)
GLUCOSE-CAPILLARY: 139 mg/dL — AB (ref 65–99)
Glucose-Capillary: 138 mg/dL — ABNORMAL HIGH (ref 65–99)
Glucose-Capillary: 122 mg/dL — ABNORMAL HIGH (ref 65–99)
Glucose-Capillary: 137 mg/dL — ABNORMAL HIGH (ref 65–99)
Glucose-Capillary: 167 mg/dL — ABNORMAL HIGH (ref 65–99)
Glucose-Capillary: 144 mg/dL — ABNORMAL HIGH (ref 65–99)

## 2017-01-12 LAB — POCT I-STAT 4, (NA,K, GLUC, HGB,HCT)
Glucose, Bld: 122 mg/dL — ABNORMAL HIGH (ref 65–99)
HCT: 36 % — ABNORMAL LOW (ref 39.0–52.0)
Hemoglobin: 12.2 g/dL — ABNORMAL LOW (ref 13.0–17.0)
Potassium: 4.4 mmol/L (ref 3.5–5.1)
SODIUM: 139 mmol/L (ref 135–145)

## 2017-01-12 LAB — HEMOGLOBIN AND HEMATOCRIT, BLOOD
HCT: 34.2 % — ABNORMAL LOW (ref 39.0–52.0)
Hemoglobin: 11.6 g/dL — ABNORMAL LOW (ref 13.0–17.0)

## 2017-01-12 LAB — APTT: APTT: 37 s — AB (ref 24–36)

## 2017-01-12 LAB — HEMOGLOBIN A1C
HEMOGLOBIN A1C: 6.4 % — AB (ref 4.8–5.6)
Mean Plasma Glucose: 137 mg/dL

## 2017-01-12 LAB — PROTIME-INR
INR: 1.39
PROTHROMBIN TIME: 17.2 s — AB (ref 11.4–15.2)

## 2017-01-12 LAB — MAGNESIUM: MAGNESIUM: 2.9 mg/dL — AB (ref 1.7–2.4)

## 2017-01-12 LAB — PLATELET COUNT: Platelets: 115 10*3/uL — ABNORMAL LOW (ref 150–400)

## 2017-01-12 SURGERY — CORONARY ARTERY BYPASS GRAFTING (CABG)
Anesthesia: General | Site: Chest

## 2017-01-12 MED ORDER — SODIUM CHLORIDE 0.9 % IV SOLN
0.0000 ug/kg/h | INTRAVENOUS | Status: DC
  Administered 2017-01-12: 0.6 ug/kg/h via INTRAVENOUS
  Filled 2017-01-12: qty 2

## 2017-01-12 MED ORDER — INSULIN REGULAR BOLUS VIA INFUSION
0.0000 [IU] | Freq: Three times a day (TID) | INTRAVENOUS | Status: DC
  Filled 2017-01-12: qty 10

## 2017-01-12 MED ORDER — SODIUM CHLORIDE 0.45 % IV SOLN: INTRAVENOUS | Status: DC | PRN

## 2017-01-12 MED ORDER — LACTATED RINGERS IV SOLN: INTRAVENOUS | Status: DC

## 2017-01-12 MED ORDER — SODIUM CHLORIDE 0.9 % IV SOLN
250.0000 mL | INTRAVENOUS | Status: DC
  Administered 2017-01-13: 250 mL via INTRAVENOUS

## 2017-01-12 MED ORDER — FENTANYL CITRATE (PF) 250 MCG/5ML IJ SOLN
INTRAMUSCULAR | Status: AC
  Filled 2017-01-12: qty 25

## 2017-01-12 MED ORDER — PROTAMINE SULFATE 10 MG/ML IV SOLN
INTRAVENOUS | Status: DC | PRN
  Administered 2017-01-12: 10 mg via INTRAVENOUS
  Administered 2017-01-12: 340 mg via INTRAVENOUS

## 2017-01-12 MED ORDER — DEXTROSE 5 % IV SOLN
750.0000 mg | INTRAVENOUS | Status: DC
  Filled 2017-01-12: qty 750

## 2017-01-12 MED ORDER — PROPOFOL 10 MG/ML IV BOLUS
INTRAVENOUS | Status: AC
  Filled 2017-01-12: qty 20

## 2017-01-12 MED ORDER — SODIUM BICARBONATE 8.4 % IV SOLN
50.0000 meq | Freq: Once | INTRAVENOUS | Status: AC
  Administered 2017-01-12: 50 meq via INTRAVENOUS

## 2017-01-12 MED ORDER — SODIUM CHLORIDE 0.9 % IV SOLN
INTRAVENOUS | Status: DC
  Filled 2017-01-12: qty 30

## 2017-01-12 MED ORDER — VANCOMYCIN HCL 1000 MG IV SOLR
INTRAVENOUS | Status: AC
  Administered 2017-01-12: 1000 mL
  Filled 2017-01-12: qty 1000

## 2017-01-12 MED ORDER — SODIUM CHLORIDE 0.9 % IV SOLN
INTRAVENOUS | Status: DC
  Filled 2017-01-12: qty 1

## 2017-01-12 MED ORDER — LACTATED RINGERS IV SOLN
INTRAVENOUS | Status: DC | PRN
  Administered 2017-01-12: 07:00:00 via INTRAVENOUS

## 2017-01-12 MED ORDER — ROCURONIUM BROMIDE 100 MG/10ML IV SOLN
INTRAVENOUS | Status: DC | PRN
  Administered 2017-01-12 (×2): 50 mg via INTRAVENOUS
  Administered 2017-01-12: 20 mg via INTRAVENOUS
  Administered 2017-01-12 (×2): 50 mg via INTRAVENOUS

## 2017-01-12 MED ORDER — ATORVASTATIN CALCIUM 40 MG PO TABS
40.0000 mg | ORAL_TABLET | Freq: Every day | ORAL | Status: DC
  Administered 2017-01-13 – 2017-01-16 (×4): 40 mg via ORAL
  Filled 2017-01-12 (×4): qty 1

## 2017-01-12 MED ORDER — DEXTROSE 5 % IV SOLN
1.5000 g | INTRAVENOUS | Status: DC
  Filled 2017-01-12: qty 1.5

## 2017-01-12 MED ORDER — HEPARIN SODIUM (PORCINE) 1000 UNIT/ML IJ SOLN
INTRAMUSCULAR | Status: DC | PRN
  Administered 2017-01-12: 3000 [IU] via INTRAVENOUS
  Administered 2017-01-12: 37000 [IU] via INTRAVENOUS

## 2017-01-12 MED ORDER — SODIUM CHLORIDE 0.9 % IV SOLN
INTRAVENOUS | Status: AC
  Administered 2017-01-12: 21:00:00 via INTRAVENOUS

## 2017-01-12 MED ORDER — EPINEPHRINE PF 1 MG/ML IJ SOLN
0.0000 ug/min | INTRAVENOUS | Status: DC
  Filled 2017-01-12: qty 4

## 2017-01-12 MED ORDER — METOPROLOL TARTRATE 25 MG/10 ML ORAL SUSPENSION: 12.5000 mg | Freq: Two times a day (BID) | ORAL | Status: DC

## 2017-01-12 MED ORDER — OXYCODONE HCL 5 MG PO TABS
5.0000 mg | ORAL_TABLET | ORAL | Status: DC | PRN
  Administered 2017-01-13: 10 mg via ORAL
  Administered 2017-01-13: 5 mg via ORAL
  Filled 2017-01-12: qty 2
  Filled 2017-01-12: qty 1

## 2017-01-12 MED ORDER — VANCOMYCIN HCL 10 G IV SOLR
1250.0000 mg | INTRAVENOUS | Status: DC
  Filled 2017-01-12: qty 1250

## 2017-01-12 MED ORDER — METOPROLOL TARTRATE 12.5 MG HALF TABLET
12.5000 mg | ORAL_TABLET | Freq: Two times a day (BID) | ORAL | Status: DC
Start: 2017-01-12 — End: 2017-01-14
  Administered 2017-01-13 (×2): 12.5 mg via ORAL
  Filled 2017-01-12 (×3): qty 1

## 2017-01-12 MED ORDER — DEXMEDETOMIDINE HCL IN NACL 400 MCG/100ML IV SOLN
0.1000 ug/kg/h | INTRAVENOUS | Status: DC
  Filled 2017-01-12: qty 100

## 2017-01-12 MED ORDER — VANCOMYCIN HCL IN DEXTROSE 1-5 GM/200ML-% IV SOLN
1000.0000 mg | Freq: Once | INTRAVENOUS | Status: AC
  Administered 2017-01-12: 1000 mg via INTRAVENOUS
  Filled 2017-01-12: qty 200

## 2017-01-12 MED ORDER — NITROGLYCERIN IN D5W 200-5 MCG/ML-% IV SOLN: 0.0000 ug/min | INTRAVENOUS | Status: DC

## 2017-01-12 MED ORDER — SODIUM CHLORIDE 0.9 % IV SOLN
0.0000 ug/min | INTRAVENOUS | Status: DC
  Filled 2017-01-12: qty 2

## 2017-01-12 MED ORDER — INSULIN REGULAR HUMAN 100 UNIT/ML IJ SOLN
INTRAMUSCULAR | Status: DC
  Filled 2017-01-12: qty 1

## 2017-01-12 MED ORDER — SODIUM CHLORIDE 0.9 % IV SOLN: INTRAVENOUS | Status: DC

## 2017-01-12 MED ORDER — ALBUMIN HUMAN 5 % IV SOLN
INTRAVENOUS | Status: DC | PRN
  Administered 2017-01-12 (×3): via INTRAVENOUS

## 2017-01-12 MED ORDER — TRANEXAMIC ACID (OHS) PUMP PRIME SOLUTION
2.0000 mg/kg | INTRAVENOUS | Status: DC
  Filled 2017-01-12: qty 1.92

## 2017-01-12 MED ORDER — METOPROLOL TARTRATE 5 MG/5ML IV SOLN
2.5000 mg | INTRAVENOUS | Status: DC | PRN
  Administered 2017-01-12 (×2): 2.5 mg via INTRAVENOUS

## 2017-01-12 MED ORDER — PANTOPRAZOLE SODIUM 40 MG PO TBEC
40.0000 mg | DELAYED_RELEASE_TABLET | Freq: Every day | ORAL | Status: DC
  Administered 2017-01-14 – 2017-01-17 (×4): 40 mg via ORAL
  Filled 2017-01-12 (×4): qty 1

## 2017-01-12 MED ORDER — FENTANYL CITRATE (PF) 250 MCG/5ML IJ SOLN
INTRAMUSCULAR | Status: AC
Start: 2017-01-12 — End: 2017-01-12
  Filled 2017-01-12: qty 10

## 2017-01-12 MED ORDER — MIDAZOLAM HCL 10 MG/2ML IJ SOLN
INTRAMUSCULAR | Status: AC
  Filled 2017-01-12: qty 2

## 2017-01-12 MED ORDER — SODIUM CHLORIDE 0.9 % IV SOLN
30.0000 ug/min | INTRAVENOUS | Status: AC
Start: 2017-01-12 — End: 2017-01-12
  Administered 2017-01-12: 15 ug/min via INTRAVENOUS
  Filled 2017-01-12: qty 2

## 2017-01-12 MED ORDER — SODIUM CHLORIDE 0.9% FLUSH
10.0000 mL | Freq: Two times a day (BID) | INTRAVENOUS | Status: DC
  Administered 2017-01-12 – 2017-01-14 (×3): 10 mL

## 2017-01-12 MED ORDER — FENTANYL CITRATE (PF) 250 MCG/5ML IJ SOLN
INTRAMUSCULAR | Status: DC | PRN
  Administered 2017-01-12: 50 ug via INTRAVENOUS
  Administered 2017-01-12: 150 ug via INTRAVENOUS
  Administered 2017-01-12 (×2): 100 ug via INTRAVENOUS
  Administered 2017-01-12: 150 ug via INTRAVENOUS
  Administered 2017-01-12 (×2): 100 ug via INTRAVENOUS
  Administered 2017-01-12 (×2): 150 ug via INTRAVENOUS
  Administered 2017-01-12: 100 ug via INTRAVENOUS
  Administered 2017-01-12: 200 ug via INTRAVENOUS
  Administered 2017-01-12: 150 ug via INTRAVENOUS

## 2017-01-12 MED ORDER — PROPOFOL 10 MG/ML IV BOLUS
INTRAVENOUS | Status: DC | PRN
  Administered 2017-01-12: 60 mg via INTRAVENOUS

## 2017-01-12 MED ORDER — SODIUM CHLORIDE 0.9% FLUSH: 10.0000 mL | INTRAVENOUS | Status: DC | PRN

## 2017-01-12 MED ORDER — ACETAMINOPHEN 500 MG PO TABS
1000.0000 mg | ORAL_TABLET | Freq: Four times a day (QID) | ORAL | Status: DC
  Administered 2017-01-13 – 2017-01-17 (×18): 1000 mg via ORAL
  Filled 2017-01-12 (×19): qty 2

## 2017-01-12 MED ORDER — TRAMADOL HCL 50 MG PO TABS
50.0000 mg | ORAL_TABLET | ORAL | Status: DC | PRN
  Administered 2017-01-13: 50 mg via ORAL
  Administered 2017-01-14: 100 mg via ORAL
  Filled 2017-01-12: qty 2
  Filled 2017-01-12: qty 1

## 2017-01-12 MED ORDER — LACTATED RINGERS IV SOLN
500.0000 mL | Freq: Once | INTRAVENOUS | Status: AC | PRN
  Administered 2017-01-12: 16:00:00 via INTRAVENOUS

## 2017-01-12 MED ORDER — SODIUM CHLORIDE 0.9% FLUSH
3.0000 mL | Freq: Two times a day (BID) | INTRAVENOUS | Status: DC
  Administered 2017-01-13 – 2017-01-17 (×6): 3 mL via INTRAVENOUS

## 2017-01-12 MED ORDER — ONDANSETRON HCL 4 MG/2ML IJ SOLN: 4.0000 mg | Freq: Four times a day (QID) | INTRAMUSCULAR | Status: DC | PRN

## 2017-01-12 MED ORDER — CHLORHEXIDINE GLUCONATE 0.12 % MT SOLN
15.0000 mL | OROMUCOSAL | Status: AC
  Administered 2017-01-12: 15 mL via OROMUCOSAL

## 2017-01-12 MED ORDER — POTASSIUM CHLORIDE 10 MEQ/50ML IV SOLN: 10.0000 meq | INTRAVENOUS | Status: AC

## 2017-01-12 MED ORDER — BISACODYL 10 MG RE SUPP: 10.0000 mg | Freq: Every day | RECTAL | Status: DC

## 2017-01-12 MED ORDER — BISACODYL 5 MG PO TBEC
10.0000 mg | DELAYED_RELEASE_TABLET | Freq: Every day | ORAL | Status: DC
  Administered 2017-01-13 – 2017-01-15 (×3): 10 mg via ORAL
  Filled 2017-01-12 (×3): qty 2

## 2017-01-12 MED ORDER — SODIUM CHLORIDE 0.9 % IV SOLN
1.5000 mg/kg/h | INTRAVENOUS | Status: DC
  Filled 2017-01-12: qty 25

## 2017-01-12 MED ORDER — MAGNESIUM SULFATE 4 GM/100ML IV SOLN
4.0000 g | Freq: Once | INTRAVENOUS | Status: AC
  Administered 2017-01-12: 4 g via INTRAVENOUS
  Filled 2017-01-12: qty 100

## 2017-01-12 MED ORDER — DEXTROSE 5 % IV SOLN
1.5000 g | Freq: Two times a day (BID) | INTRAVENOUS | Status: AC
  Administered 2017-01-12 – 2017-01-14 (×4): 1.5 g via INTRAVENOUS
  Filled 2017-01-12 (×4): qty 1.5

## 2017-01-12 MED ORDER — DOCUSATE SODIUM 100 MG PO CAPS
200.0000 mg | ORAL_CAPSULE | Freq: Every day | ORAL | Status: DC
  Administered 2017-01-13 – 2017-01-17 (×4): 200 mg via ORAL
  Filled 2017-01-12 (×5): qty 2

## 2017-01-12 MED ORDER — ORAL CARE MOUTH RINSE
15.0000 mL | OROMUCOSAL | Status: DC
  Administered 2017-01-12 – 2017-01-13 (×5): 15 mL via OROMUCOSAL

## 2017-01-12 MED ORDER — FAMOTIDINE IN NACL 20-0.9 MG/50ML-% IV SOLN
20.0000 mg | Freq: Two times a day (BID) | INTRAVENOUS | Status: DC
  Administered 2017-01-12: 20 mg via INTRAVENOUS

## 2017-01-12 MED ORDER — MIDAZOLAM HCL 2 MG/2ML IJ SOLN
2.0000 mg | INTRAMUSCULAR | Status: DC | PRN
  Administered 2017-01-12: 2 mg via INTRAVENOUS
  Filled 2017-01-12: qty 2

## 2017-01-12 MED ORDER — ALBUMIN HUMAN 5 % IV SOLN
250.0000 mL | INTRAVENOUS | Status: AC | PRN
  Administered 2017-01-12 (×3): 250 mL via INTRAVENOUS

## 2017-01-12 MED ORDER — MAGNESIUM SULFATE 50 % IJ SOLN
40.0000 meq | INTRAMUSCULAR | Status: DC
  Filled 2017-01-12: qty 10

## 2017-01-12 MED ORDER — ACETAMINOPHEN 160 MG/5ML PO SOLN: 1000.0000 mg | Freq: Four times a day (QID) | ORAL | Status: DC

## 2017-01-12 MED ORDER — NITROGLYCERIN IN D5W 200-5 MCG/ML-% IV SOLN
2.0000 ug/min | INTRAVENOUS | Status: DC
Start: 2017-01-12 — End: 2017-01-12
  Filled 2017-01-12: qty 250

## 2017-01-12 MED ORDER — TRANEXAMIC ACID (OHS) BOLUS VIA INFUSION
15.0000 mg/kg | INTRAVENOUS | Status: DC
  Filled 2017-01-12: qty 1442

## 2017-01-12 MED ORDER — CHLORHEXIDINE GLUCONATE CLOTH 2 % EX PADS
6.0000 | MEDICATED_PAD | Freq: Every day | CUTANEOUS | Status: DC
  Administered 2017-01-12 – 2017-01-17 (×3): 6 via TOPICAL

## 2017-01-12 MED ORDER — MORPHINE SULFATE (PF) 4 MG/ML IV SOLN
1.0000 mg | INTRAVENOUS | Status: DC | PRN
  Administered 2017-01-12 (×4): 4 mg via INTRAVENOUS
  Filled 2017-01-12 (×4): qty 1

## 2017-01-12 MED ORDER — ACETAMINOPHEN 160 MG/5ML PO SOLN: 650.0000 mg | Freq: Once | ORAL | Status: AC

## 2017-01-12 MED ORDER — DOPAMINE-DEXTROSE 3.2-5 MG/ML-% IV SOLN: 0.0000 ug/kg/min | INTRAVENOUS | Status: DC

## 2017-01-12 MED ORDER — SODIUM CHLORIDE 0.9 % IJ SOLN
INTRAMUSCULAR | Status: DC | PRN
  Administered 2017-01-12 (×3): 4 mL via TOPICAL

## 2017-01-12 MED ORDER — ASPIRIN EC 325 MG PO TBEC
325.0000 mg | DELAYED_RELEASE_TABLET | Freq: Every day | ORAL | Status: DC
  Administered 2017-01-13: 325 mg via ORAL
  Filled 2017-01-12 (×2): qty 1

## 2017-01-12 MED ORDER — ACETAMINOPHEN 650 MG RE SUPP
650.0000 mg | Freq: Once | RECTAL | Status: AC
  Administered 2017-01-12: 650 mg via RECTAL

## 2017-01-12 MED ORDER — MORPHINE SULFATE (PF) 4 MG/ML IV SOLN
1.0000 mg | INTRAVENOUS | Status: DC | PRN
  Administered 2017-01-12 – 2017-01-14 (×4): 2 mg via INTRAVENOUS
  Filled 2017-01-12 (×4): qty 1

## 2017-01-12 MED ORDER — 0.9 % SODIUM CHLORIDE (POUR BTL) OPTIME
TOPICAL | Status: DC | PRN
  Administered 2017-01-12: 6000 mL

## 2017-01-12 MED ORDER — POTASSIUM CHLORIDE 2 MEQ/ML IV SOLN
80.0000 meq | INTRAVENOUS | Status: DC
  Filled 2017-01-12: qty 40

## 2017-01-12 MED ORDER — SODIUM CHLORIDE 0.9% FLUSH: 3.0000 mL | INTRAVENOUS | Status: DC | PRN

## 2017-01-12 MED ORDER — ASPIRIN 81 MG PO CHEW: 324.0000 mg | CHEWABLE_TABLET | Freq: Every day | ORAL | Status: DC

## 2017-01-12 MED ORDER — MIDAZOLAM HCL 5 MG/5ML IJ SOLN
INTRAMUSCULAR | Status: DC | PRN
  Administered 2017-01-12: 1 mg via INTRAVENOUS
  Administered 2017-01-12: 3 mg via INTRAVENOUS
  Administered 2017-01-12: 4 mg via INTRAVENOUS

## 2017-01-12 MED ORDER — CHLORHEXIDINE GLUCONATE 0.12% ORAL RINSE (MEDLINE KIT)
15.0000 mL | Freq: Two times a day (BID) | OROMUCOSAL | Status: DC
  Administered 2017-01-12: 15 mL via OROMUCOSAL

## 2017-01-12 SURGICAL SUPPLY — 113 items
BAG DECANTER FOR FLEXI CONT (MISCELLANEOUS) ×4 IMPLANT
BANDAGE ACE 4X5 VEL STRL LF (GAUZE/BANDAGES/DRESSINGS) ×2 IMPLANT
BANDAGE ACE 6X5 VEL STRL LF (GAUZE/BANDAGES/DRESSINGS) ×2 IMPLANT
BASKET HEART (ORDER IN 25'S) (MISCELLANEOUS) ×1
BASKET HEART (ORDER IN 25S) (MISCELLANEOUS) ×1 IMPLANT
BLADE CLIPPER SURG (BLADE) IMPLANT
BLADE NEEDLE 3 SS STRL (BLADE) ×2 IMPLANT
BLADE STERNUM SYSTEM 6 (BLADE) ×2 IMPLANT
BNDG GAUZE ELAST 4 BULKY (GAUZE/BANDAGES/DRESSINGS) ×2 IMPLANT
CANISTER SUCT 3000ML PPV (MISCELLANEOUS) ×2 IMPLANT
CANNULA EZ GLIDE AORTIC 21FR (CANNULA) ×4 IMPLANT
CATH CPB KIT OWEN (MISCELLANEOUS) ×2 IMPLANT
CATH THORACIC 36FR (CATHETERS) ×2 IMPLANT
CLIP RETRACTION 3.0MM CORONARY (MISCELLANEOUS) ×2 IMPLANT
CLIP VESOCCLUDE MED 24/CT (CLIP) IMPLANT
CLIP VESOCCLUDE SM WIDE 24/CT (CLIP) IMPLANT
CONN ST 1/4X3/8  BEN (MISCELLANEOUS) ×2
CONN ST 1/4X3/8 BEN (MISCELLANEOUS) ×2 IMPLANT
CRADLE DONUT ADULT HEAD (MISCELLANEOUS) ×2 IMPLANT
DERMABOND ADVANCED (GAUZE/BANDAGES/DRESSINGS) ×1
DERMABOND ADVANCED .7 DNX12 (GAUZE/BANDAGES/DRESSINGS) ×1 IMPLANT
DRAIN CHANNEL 32F RND 10.7 FF (WOUND CARE) ×6 IMPLANT
DRAPE CARDIOVASCULAR INCISE (DRAPES) ×1
DRAPE INCISE IOBAN 66X45 STRL (DRAPES) ×4 IMPLANT
DRAPE SLUSH/WARMER DISC (DRAPES) ×2 IMPLANT
DRAPE SRG 135X102X78XABS (DRAPES) ×1 IMPLANT
DRSG AQUACEL AG ADV 3.5X14 (GAUZE/BANDAGES/DRESSINGS) ×2 IMPLANT
DRSG COVADERM 4X14 (GAUZE/BANDAGES/DRESSINGS) ×2 IMPLANT
ELECT BLADE 4.0 EZ CLEAN MEGAD (MISCELLANEOUS) ×2
ELECT REM PT RETURN 9FT ADLT (ELECTROSURGICAL) ×4
ELECTRODE BLDE 4.0 EZ CLN MEGD (MISCELLANEOUS) ×1 IMPLANT
ELECTRODE REM PT RTRN 9FT ADLT (ELECTROSURGICAL) ×2 IMPLANT
FELT TEFLON 1X6 (MISCELLANEOUS) ×4 IMPLANT
GAUZE SPONGE 4X4 12PLY STRL (GAUZE/BANDAGES/DRESSINGS) ×4 IMPLANT
GAUZE SPONGE 4X4 12PLY STRL LF (GAUZE/BANDAGES/DRESSINGS) ×4 IMPLANT
GLOVE BIO SURGEON STRL SZ 6.5 (GLOVE) ×12 IMPLANT
GLOVE ORTHO TXT STRL SZ7.5 (GLOVE) ×4 IMPLANT
GOWN STRL REUS W/ TWL LRG LVL3 (GOWN DISPOSABLE) ×10 IMPLANT
GOWN STRL REUS W/TWL LRG LVL3 (GOWN DISPOSABLE) ×10
HEMOSTAT POWDER SURGIFOAM 1G (HEMOSTASIS) ×6 IMPLANT
INSERT FOGARTY XLG (MISCELLANEOUS) ×4 IMPLANT
KIT BASIN OR (CUSTOM PROCEDURE TRAY) ×2 IMPLANT
KIT ROOM TURNOVER OR (KITS) ×2 IMPLANT
KIT SUCTION CATH 14FR (SUCTIONS) ×6 IMPLANT
KIT VASOVIEW HEMOPRO VH 3000 (KITS) ×2 IMPLANT
LEAD PACING MYOCARDI (MISCELLANEOUS) ×2 IMPLANT
MARKER GRAFT CORONARY BYPASS (MISCELLANEOUS) ×6 IMPLANT
NS IRRIG 1000ML POUR BTL (IV SOLUTION) ×10 IMPLANT
PACK OPEN HEART (CUSTOM PROCEDURE TRAY) ×2 IMPLANT
PAD ARMBOARD 7.5X6 YLW CONV (MISCELLANEOUS) ×4 IMPLANT
PAD ELECT DEFIB RADIOL ZOLL (MISCELLANEOUS) ×2 IMPLANT
PENCIL BUTTON HOLSTER BLD 10FT (ELECTRODE) ×2 IMPLANT
PUNCH AORTIC ROTATE 4.0MM (MISCELLANEOUS) ×2 IMPLANT
PUNCH AORTIC ROTATE 4.5MM 8IN (MISCELLANEOUS) IMPLANT
PUNCH AORTIC ROTATE 5MM 8IN (MISCELLANEOUS) IMPLANT
SET CARDIOPLEGIA MPS 5001102 (MISCELLANEOUS) ×2 IMPLANT
SOLUTION ANTI FOG 6CC (MISCELLANEOUS) IMPLANT
SPONGE LAP 18X18 X RAY DECT (DISPOSABLE) IMPLANT
SPONGE LAP 4X18 X RAY DECT (DISPOSABLE) ×2 IMPLANT
SUT BONE WAX W31G (SUTURE) ×2 IMPLANT
SUT ETHIBOND 2 0 SH (SUTURE) ×5
SUT ETHIBOND 2 0 SH 36X2 (SUTURE) ×5 IMPLANT
SUT ETHIBOND X763 2 0 SH 1 (SUTURE) ×4 IMPLANT
SUT MNCRL AB 3-0 PS2 18 (SUTURE) ×4 IMPLANT
SUT MNCRL AB 4-0 PS2 18 (SUTURE) IMPLANT
SUT PDS AB 1 CTX 36 (SUTURE) ×4 IMPLANT
SUT PROLENE 2 0 SH DA (SUTURE) IMPLANT
SUT PROLENE 3 0 SH DA (SUTURE) ×2 IMPLANT
SUT PROLENE 3 0 SH1 36 (SUTURE) IMPLANT
SUT PROLENE 4 0 RB 1 (SUTURE) ×3
SUT PROLENE 4 0 SH DA (SUTURE) IMPLANT
SUT PROLENE 4-0 RB1 .5 CRCL 36 (SUTURE) ×3 IMPLANT
SUT PROLENE 5 0 C 1 36 (SUTURE) ×4 IMPLANT
SUT PROLENE 6 0 C 1 30 (SUTURE) ×12 IMPLANT
SUT PROLENE 7.0 RB 3 (SUTURE) ×14 IMPLANT
SUT PROLENE 8 0 BV175 6 (SUTURE) ×6 IMPLANT
SUT PROLENE BLUE 7 0 (SUTURE) ×2 IMPLANT
SUT PROLENE POLY MONO (SUTURE) ×2 IMPLANT
SUT SILK  1 MH (SUTURE) ×6
SUT SILK 1 MH (SUTURE) ×6 IMPLANT
SUT SILK 1 TIES 10X30 (SUTURE) ×2 IMPLANT
SUT SILK 2 0 SH CR/8 (SUTURE) ×4 IMPLANT
SUT SILK 2 0 TIES 10X30 (SUTURE) ×2 IMPLANT
SUT SILK 2 0 TIES 17X18 (SUTURE) ×1
SUT SILK 2-0 18XBRD TIE BLK (SUTURE) ×1 IMPLANT
SUT SILK 3 0 SH CR/8 (SUTURE) ×2 IMPLANT
SUT SILK 4 0 TIE 10X30 (SUTURE) ×4 IMPLANT
SUT STEEL 6MS V (SUTURE) IMPLANT
SUT STEEL STERNAL CCS#1 18IN (SUTURE) IMPLANT
SUT STEEL SZ 6 DBL 3X14 BALL (SUTURE) IMPLANT
SUT TEM PAC WIRE 2 0 SH (SUTURE) ×8 IMPLANT
SUT VIC AB 1 CTX 36 (SUTURE)
SUT VIC AB 1 CTX36XBRD ANBCTR (SUTURE) IMPLANT
SUT VIC AB 2-0 CT1 27 (SUTURE) ×1
SUT VIC AB 2-0 CT1 TAPERPNT 27 (SUTURE) ×1 IMPLANT
SUT VIC AB 2-0 CTX 27 (SUTURE) ×4 IMPLANT
SUT VIC AB 3-0 SH 27 (SUTURE)
SUT VIC AB 3-0 SH 27X BRD (SUTURE) IMPLANT
SUT VIC AB 3-0 SH 8-18 (SUTURE) ×2 IMPLANT
SUT VIC AB 3-0 X1 27 (SUTURE) IMPLANT
SUT VICRYL 4-0 PS2 18IN ABS (SUTURE) IMPLANT
SUTURE E-PAK OPEN HEART (SUTURE) ×2 IMPLANT
SYSTEM SAHARA CHEST DRAIN ATS (WOUND CARE) ×4 IMPLANT
TAPE CLOTH SURG 4X10 WHT LF (GAUZE/BANDAGES/DRESSINGS) ×2 IMPLANT
TAPE PAPER 2X10 WHT MICROPORE (GAUZE/BANDAGES/DRESSINGS) ×2 IMPLANT
TOWEL GREEN STERILE (TOWEL DISPOSABLE) ×8 IMPLANT
TOWEL GREEN STERILE FF (TOWEL DISPOSABLE) ×4 IMPLANT
TOWEL OR 17X24 6PK STRL BLUE (TOWEL DISPOSABLE) ×4 IMPLANT
TOWEL OR 17X26 10 PK STRL BLUE (TOWEL DISPOSABLE) ×4 IMPLANT
TRAY FOLEY SILVER 16FR TEMP (SET/KITS/TRAYS/PACK) ×2 IMPLANT
TUBING INSUFFLATION (TUBING) ×2 IMPLANT
UNDERPAD 30X30 (UNDERPADS AND DIAPERS) ×2 IMPLANT
WATER STERILE IRR 1000ML POUR (IV SOLUTION) ×4 IMPLANT

## 2017-01-12 NOTE — Progress Notes (Signed)
RT NOTE:  Racpid Cardiac Wean initiated.

## 2017-01-12 NOTE — Anesthesia Procedure Notes (Signed)
Arterial Line Insertion Start/End7/19/2018 7:00 AM, 01/12/2017 7:00 AM Performed by: CRNA  Patient location: Pre-op. Preanesthetic checklist: patient identified, IV checked, site marked, risks and benefits discussed, surgical consent, monitors and equipment checked, pre-op evaluation, timeout performed and anesthesia consent Lidocaine 1% used for infiltration Left, radial was placed Catheter size: 20 Fr Hand hygiene performed  and maximum sterile barriers used   Attempts: 1 Procedure performed without using ultrasound guided technique. Following insertion, dressing applied. Post procedure assessment: normal and unchanged

## 2017-01-12 NOTE — Anesthesia Preprocedure Evaluation (Addendum)
Anesthesia Evaluation  Patient identified by MRN, date of birth, ID band Patient awake    Reviewed: Allergy & Precautions, NPO status , Patient's Chart, lab work & pertinent test results  Airway Mallampati: II  TM Distance: >3 FB Neck ROM: Full    Dental  (+) Teeth Intact, Dental Advisory Given   Pulmonary    breath sounds clear to auscultation       Cardiovascular hypertension,  Rhythm:Regular Rate:Normal     Neuro/Psych    GI/Hepatic   Endo/Other    Renal/GU      Musculoskeletal   Abdominal   Peds  Hematology   Anesthesia Other Findings   Reproductive/Obstetrics                             Anesthesia Physical Anesthesia Plan  ASA: IV  Anesthesia Plan: General   Post-op Pain Management:    Induction: Intravenous  PONV Risk Score and Plan: Ondansetron and Dexamethasone  Airway Management Planned: Oral ETT  Additional Equipment: PA Cath, CVP, 3D TEE, Arterial line and Ultrasound Guidance Line Placement  Intra-op Plan:   Post-operative Plan:   Informed Consent: I have reviewed the patients History and Physical, chart, labs and discussed the procedure including the risks, benefits and alternatives for the proposed anesthesia with the patient or authorized representative who has indicated his/her understanding and acceptance.     Plan Discussed with: CRNA and Anesthesiologist  Anesthesia Plan Comments:         Anesthesia Quick Evaluation

## 2017-01-12 NOTE — OR Nursing (Signed)
11:50 - 45 minute call to SICU charge nurse 12:20 - 20 minutes call to SICU charge nurse

## 2017-01-12 NOTE — Plan of Care (Signed)
Problem: Pain Managment: Goal: General experience of comfort will improve Outcome: Progressing Patient has had no complaints of pain.   Problem: Skin Integrity: Goal: Risk for impaired skin integrity will decrease Outcome: Progressing Skin prepped for CABG this AM. Mepilex placed on Saccrum

## 2017-01-12 NOTE — Progress Notes (Signed)
Pt failed wean due to increased RR and ABG results.  Pt placed back on SIMV vent support.

## 2017-01-12 NOTE — Progress Notes (Signed)
  Echocardiogram Echocardiogram Transesophageal has been performed.  Dale Cook 01/12/2017, 8:30 AM

## 2017-01-12 NOTE — Transfer of Care (Signed)
Immediate Anesthesia Transfer of Care Note  Patient: Loreli DollarOtis Wilbert Ganus Jr.  Procedure(s) Performed: Procedure(s): CORONARY ARTERY BYPASS GRAFTING (CABG) x four , using left internal mammary artery and left leg greater saphenous vein harvested endoscopically (N/A) INTRAOPERATIVE TRANSESOPHAGEAL ECHOCARDIOGRAM (N/A)  Patient Location: SICU  Anesthesia Type:General  Level of Consciousness: Patient remains intubated per anesthesia plan  Airway & Oxygen Therapy: Patient remains intubated per anesthesia plan  Post-op Assessment: Report given to RN and Post -op Vital signs reviewed and stable  Post vital signs: Reviewed and stable  Last Vitals:  Vitals:   01/12/17 0453 01/12/17 1254  BP: 124/84 (!) 141/78  Pulse:  86  Resp: 17 (!) 22  Temp: 36.6 C     Last Pain:  Vitals:   01/12/17 0453  TempSrc: Oral         Complications: No apparent anesthesia complications

## 2017-01-12 NOTE — Progress Notes (Signed)
Dr. Laneta SimmersBartle at bedside regarding failed wean per Rapid Wean Protocol parameters.  Per Dr. Laneta SimmersBartle give 1 amp Sodium Bicarb and attempt to wean and extubate again.

## 2017-01-12 NOTE — Anesthesia Procedure Notes (Signed)
Procedure Name: Intubation Date/Time: 01/12/2017 7:56 AM Performed by: Clearnce Sorrel Pre-anesthesia Checklist: Patient identified, Emergency Drugs available, Suction available, Patient being monitored and Timeout performed Patient Re-evaluated:Patient Re-evaluated prior to induction Oxygen Delivery Method: Circle system utilized Preoxygenation: Pre-oxygenation with 100% oxygen Induction Type: IV induction Ventilation: Mask ventilation without difficulty Laryngoscope Size: Mac and 4 Grade View: Grade I Tube type: Oral Tube size: 8.0 mm Number of attempts: 1 Airway Equipment and Method: Stylet Placement Confirmation: ETT inserted through vocal cords under direct vision,  positive ETCO2 and breath sounds checked- equal and bilateral Secured at: 23 cm Tube secured with: Tape Dental Injury: Teeth and Oropharynx as per pre-operative assessment

## 2017-01-12 NOTE — Progress Notes (Signed)
Patient ID: Loreli DollarOtis Wilbert Jun Jr., male   DOB: 09/07/1945, 71 y.o.   MRN: [id number]   SICU Evening Rounds:   Hemodynamically stable  CI = 1.7  Awake on vent. Strong. Ready for extubation  Urine output good  CT output low  CBC    Component Value Date/Time   WBC 13.6 (H) 01/12/2017 1304   RBC 4.38 01/12/2017 1304   HGB 12.5 (L) 01/12/2017 1304   HCT 37.3 (L) 01/12/2017 1304   PLT 111 (L) 01/12/2017 1304   MCV 85.2 01/12/2017 1304   MCH 28.5 01/12/2017 1304   MCHC 33.5 01/12/2017 1304   RDW 13.4 01/12/2017 1304     BMET    Component Value Date/Time   NA 136 01/12/2017 1146   K 5.4 (H) 01/12/2017 1146   CL 99 (L) 01/12/2017 1146   CO2 25 01/12/2017 0151   GLUCOSE 139 (H) 01/12/2017 1146   BUN 13 01/12/2017 1146   CREATININE 0.80 01/12/2017 1146   CALCIUM 8.8 (L) 01/12/2017 0151   GFRNONAA 60 (L) 01/12/2017 0151   GFRAA >60 01/12/2017 0151     A/P:  Stable postop course. Continue current plans

## 2017-01-12 NOTE — Brief Op Note (Addendum)
01/09/2017 - 01/12/2017  11:16 AM  PATIENT:  Dale Dollartis Wilbert Grondin Jr.  71 y.o. male  PRE-OPERATIVE DIAGNOSIS:  CAD  POST-OPERATIVE DIAGNOSIS:  CAD  PROCEDURE:  Procedure(s): CORONARY ARTERY BYPASS GRAFTING (CABG) x four , using left internal mammary artery and left leg greater saphenous vein harvested endoscopically (N/A) INTRAOPERATIVE TRANSESOPHAGEAL ECHOCARDIOGRAM (N/A) LIMA-MID LAD SVG-DIST LAD SVG(SEQ) -OM1-OM2  SURGEON:    Purcell Nailslarence H Owen, MD  ASSISTANTS:  Rowe ClackWayne E Gold, PA-C  ANESTHESIA:   Kipp BroodJoslin, David, MD  CROSSCLAMP TIME:   5171'  CARDIOPULMONARY BYPASS TIME: 5891'  FINDINGS:  Normal LV systolic function  Moderate LVH with diastolic dysfunction  Good quality LIMA conduit for grafting  Good quality SVG conduit for grafting  Good quality target vessels for grafting  COMPLICATIONS: None  BASELINE WEIGHT: 95 kg  PATIENT DISPOSITION:   TO SICU IN STABLE CONDITION  Purcell Nailslarence H Owen, MD 01/12/2017 12:24 PM

## 2017-01-12 NOTE — Anesthesia Postprocedure Evaluation (Signed)
Anesthesia Post Note  Patient: Loreli DollarOtis Wilbert Bissonnette Jr.  Procedure(s) Performed: Procedure(s) (LRB): CORONARY ARTERY BYPASS GRAFTING (CABG) x four , using left internal mammary artery and left leg greater saphenous vein harvested endoscopically (N/A) INTRAOPERATIVE TRANSESOPHAGEAL ECHOCARDIOGRAM (N/A)     Patient location during evaluation: SICU Anesthesia Type: General Level of consciousness: sedated and patient remains intubated per anesthesia plan Pain management: pain level controlled Vital Signs Assessment: post-procedure vital signs reviewed and stable Respiratory status: spontaneous breathing, nonlabored ventilation and respiratory function stable Cardiovascular status: blood pressure returned to baseline Anesthetic complications: no    Last Vitals:  Vitals:   01/12/17 1706 01/12/17 1750  BP: 109/81 105/64  Pulse:  93  Resp: (!) 36 (!) 40  Temp:      Last Pain:  Vitals:   01/12/17 0453  TempSrc: Oral                 Kimberly Nieland COKER

## 2017-01-12 NOTE — Anesthesia Procedure Notes (Addendum)
Central Venous Catheter Insertion Performed by: Kipp BroodJOSLIN, Mohit Zirbes, anesthesiologist Start/End7/19/2018 7:00 PM, 01/12/2017 7:10 AM Patient location: Pre-op. Preanesthetic checklist: patient identified, IV checked, site marked, risks and benefits discussed, surgical consent, monitors and equipment checked, pre-op evaluation and timeout performed Hand hygiene performed , maximum sterile barriers used  and Seldinger technique used Catheter size: 9 Fr PA cath was placed.Swan type:thermodilution Procedure performed using ultrasound guided technique. Attempts: 1 Following insertion, line sutured and dressing applied. Post procedure assessment: blood return through all ports, free fluid flow and no air  Patient tolerated the procedure well with no immediate complications.

## 2017-01-12 NOTE — Op Note (Signed)
CARDIOTHORACIC SURGERY OPERATIVE NOTE  Date of Procedure: 01/12/2017  Preoperative Diagnosis:   Severe 3-vessel Coronary Artery Disease  Unstable Angina Pectoris  Postoperative Diagnosis: Same  Procedure:    Coronary Artery Bypass Grafting x 4   Left Internal Mammary Artery to Mid Left Anterior Descending Coronary Artery  Saphenous Vein Graft to Distal Left Anterior Descending Coronary Artery  Saphenous Vein Graft to First Obtuse Marginal Branch of Left Circumflex Coronary Artery  Sequential Sapheonous Vein Graft to Second Obtuse Marginal Branch of Left Circumflex Coronary Artery  Endoscopic Vein Harvest from Left Thigh and Lower Leg  Surgeon: Salvatore Decent. Cornelius Moras, MD  Assistant: Rowe Clack, PA-C  Anesthesia: Kipp Brood, MD  Operative Findings:  Normal LV systolic function  Moderate LVH with diastolic dysfunction  Good quality LIMA conduit for grafting  Good quality SVG conduit for grafting  Good quality target vessels for grafting     BRIEF CLINICAL NOTE AND INDICATIONS FOR SURGERY  Patient is a 71 year old African-American male with no previous history of coronary artery disease but risk factors notable for history of hypertension and hyperlipidemia. The patient states that approximately 6 months ago he began to experience symptoms of classical exertional substernal chest pain described as pressure or the sensation of a heavy weight across his mid chest which occur only with physical exertion, is associated with shortness of breath, and is always probably relieved by rest. Symptoms initially wax and wane in severity but accelerated in frequency and severity in recent weeks. Last week he had a prolonged episode of chest discomfort with activity that persisted for nearly 30 minutes.  He was evaluated by Dr. Kirke Corin and underwent diagnostic cardiac catheterization on 01/09/2017.  Catheterization revealed critical three-vessel coronary artery disease with preserved left  ventricular systolic function. The patient was transferred to [facility] for surgical consultation and treatment.  The patient has been seen in consultation and counseled at length regarding the indications, risks and potential benefits of surgery.  All questions have been answered, and the patient provides full informed consent for the operation as described.    DETAILS OF THE OPERATIVE PROCEDURE  Preparation:  The patient is brought to the operating room on the above mentioned date and central monitoring was established by the anesthesia team including placement of Swan-Ganz catheter and radial arterial line. The patient is placed in the supine position on the operating table.  Intravenous antibiotics are administered. General endotracheal anesthesia is induced uneventfully. A Foley catheter is placed.  Baseline transesophageal echocardiogram was performed.  Findings were notable for normal LV systolic function.  There was moderate LVH.  There was mild aortic valve sclerosis without any aortic stenosis.  The patient's chest, abdomen, both groins, and both lower extremities are prepared and draped in a sterile manner. A time out procedure is performed.   Surgical Approach and Conduit Harvest:  A median sternotomy incision was performed and the left internal mammary artery is dissected from the chest wall and prepared for bypass grafting. The left internal mammary artery is notably good quality conduit. Simultaneously, the greater saphenous vein is obtained from the patient's left thigh and leg using endoscopic vein harvest technique. The saphenous vein is notably good quality conduit. After removal of the saphenous vein, the small surgical incisions in the lower extremity are closed with absorbable suture. Following systemic heparinization, the left internal mammary artery was transected distally noted to have excellent flow.   Extracorporeal Cardiopulmonary Bypass and  Myocardial Protection:  The pericardium is opened.  The ascending aorta is normal in appearance. The ascending aorta and the right atrium are cannulated for cardiopulmonary bypass.  Adequate heparinization is verified.     The entire pre-bypass portion of the operation was notable for stable hemodynamics.  Cardiopulmonary bypass was begun and the surface of the heart is inspected. Distal target vessels are selected for coronary artery bypass grafting. A cardioplegia cannula is placed in the ascending aorta.  A temperature probe was placed in the interventricular septum.  The patient is allowed to cool passively to [facility] systemic temperature.  The aortic cross clamp is applied and cold blood cardioplegia is delivered initially in an antegrade fashion through the aortic root.   Iced saline slush is applied for topical hypothermia.  The initial cardioplegic arrest is rapid with early diastolic arrest.  Repeat doses of cardioplegia are administered intermittently throughout the entire cross clamp portion of the operation through the aortic root and through subsequently placed vein grafts in order to maintain completely flat electrocardiogram and septal myocardial temperature below 15C.  Myocardial protection was felt to be excellent.  Coronary Artery Bypass Grafting:   The first obtuse marginal branch of the left circumflex coronary artery was grafted using a reversed saphenous vein graft in an side-to-side fashion.  At the site of distal anastomosis the target vessel was good quality and measured approximately 2.0 mm in diameter.  The second obtuse marginal branch of the left circumflex coronary artery was grafted in and end-to-side fashion using a sequential saphenous vein graft off of the distal segment of the vein graft placed to the first obtuse marginal branch coronary artery.  At the site of distal anastomosis the target vessel was good quality and measured approximately 2.0 mm in diameter.  The  distal left anterior descending coronary artery was grafted using a reversed saphenous vein graft close to the LV apex in an end-to-side fashion.  At the site of distal anastomosis the target vessel was good quality and measured approximately 1.8 mm in diameter.  The mid left anterior coronary artery was grafted with the left internal mammary artery in an end-to-side fashion.  At the site of distal anastomosis the target vessel was good quality and measured approximately 2.0 mm in diameter.  All proximal vein graft anastomoses were placed directly to the ascending aorta prior to removal of the aortic cross clamp.  The septal myocardial temperature rose rapidly after reperfusion of the left internal mammary artery graft.  The aortic cross clamp was removed after a total cross clamp time of 71 minutes.   Procedure Completion:  All proximal and distal coronary anastomoses were inspected for hemostasis and appropriate graft orientation. Epicardial pacing wires are fixed to the right ventricular outflow tract and to the right atrial appendage. The patient is rewarmed to 37C temperature. The patient is weaned and disconnected from cardiopulmonary bypass.  The patient's rhythm at separation from bypass was sinus.  The patient was weaned from cardiopulmonary bypass without any inotropic support. Total cardiopulmonary bypass time for the operation was 91 minutes.  Followup transesophageal echocardiogram performed after separation from bypass revealed no changes from the preoperative exam.  The aortic and venous cannula were removed uneventfully. Protamine was administered to reverse the anticoagulation. The mediastinum and pleural space were inspected for hemostasis and irrigated with saline solution. The mediastinum and both pleural space were drained using 4 chest tubes placed through separate stab incisions inferiorly.  The soft tissues anterior to the aorta were reapproximated loosely. The sternum is closed  with  double strength sternal wire. The soft tissues anterior to the sternum were closed in multiple layers and the skin is closed with a running subcuticular skin closure.  The post-bypass portion of the operation was notable for stable rhythm and hemodynamics.  No blood products were administered during the operation.   Disposition:  The patient tolerated the procedure well and is transported to the surgical intensive care in stable condition. There are no intraoperative complications. All sponge instrument and needle counts are verified correct at completion of the operation.    Salvatore Decentlarence H. Cornelius Moraswen MD 01/12/2017 12:29 PM

## 2017-01-12 NOTE — Procedures (Signed)
Extubation Procedure Note  Pt extubated following Cardiac Rapid Wean. Follows all commands.  NIF -40 VC 1.2L Cuff leak + No stridor, Pt A&O 4L Dale Cook   Patient Details:   Name: Dale DollarOtis Wilbert Winski Jr. DOB: 05/18/1946 MRN: [id number]   Airway Documentation:     Evaluation  O2 sats: stable throughout Complications: No apparent complications Patient did tolerate procedure well. Bilateral Breath Sounds: Clear   Yes  Elmer PickerClifton, Valia Wingard D 01/12/2017, 8:06 PM

## 2017-01-13 ENCOUNTER — Encounter (HOSPITAL_COMMUNITY): Payer: Self-pay | Admitting: Thoracic Surgery (Cardiothoracic Vascular Surgery)

## 2017-01-13 ENCOUNTER — Inpatient Hospital Stay (HOSPITAL_COMMUNITY): Payer: Medicare PPO

## 2017-01-13 LAB — BASIC METABOLIC PANEL
ANION GAP: 6 (ref 5–15)
BUN: 13 mg/dL (ref 6–20)
CO2: 23 mmol/L (ref 22–32)
Calcium: 7.7 mg/dL — ABNORMAL LOW (ref 8.9–10.3)
Chloride: 106 mmol/L (ref 101–111)
Creatinine, Ser: 1.46 mg/dL — ABNORMAL HIGH (ref 0.61–1.24)
GFR calc Af Amer: 55 mL/min — ABNORMAL LOW (ref >60)
GFR, EST NON AFRICAN AMERICAN: 48 mL/min — AB (ref >60)
GLUCOSE: 116 mg/dL — AB (ref 65–99)
POTASSIUM: 5.2 mmol/L — AB (ref 3.5–5.1)
Sodium: 135 mmol/L (ref 135–145)

## 2017-01-13 LAB — POCT I-STAT EG7
ACID-BASE DEFICIT: 1 mmol/L (ref 0.0–2.0)
Bicarbonate: 24.1 mmol/L (ref 20.0–28.0)
Calcium, Ion: 1.01 mmol/L — ABNORMAL LOW (ref 1.15–1.40)
HEMATOCRIT: 37 % — AB (ref 39.0–52.0)
HEMOGLOBIN: 12.6 g/dL — AB (ref 13.0–17.0)
O2 SAT: 59 %
PCO2 VEN: 38.8 mmHg — AB (ref 44.0–60.0)
PH VEN: 7.395 (ref 7.250–7.430)
PO2 VEN: 28 mmHg — AB (ref 32.0–45.0)
POTASSIUM: 4.6 mmol/L (ref 3.5–5.1)
Sodium: 139 mmol/L (ref 135–145)
TCO2: 25 mmol/L (ref 0–100)

## 2017-01-13 LAB — GLUCOSE, CAPILLARY
GLUCOSE-CAPILLARY: 108 mg/dL — AB (ref 65–99)
GLUCOSE-CAPILLARY: 111 mg/dL — AB (ref 65–99)
GLUCOSE-CAPILLARY: 112 mg/dL — AB (ref 65–99)
GLUCOSE-CAPILLARY: 118 mg/dL — AB (ref 65–99)
GLUCOSE-CAPILLARY: 119 mg/dL — AB (ref 65–99)
GLUCOSE-CAPILLARY: 120 mg/dL — AB (ref 65–99)
GLUCOSE-CAPILLARY: 122 mg/dL — AB (ref 65–99)
GLUCOSE-CAPILLARY: 123 mg/dL — AB (ref 65–99)
GLUCOSE-CAPILLARY: 126 mg/dL — AB (ref 65–99)
GLUCOSE-CAPILLARY: 132 mg/dL — AB (ref 65–99)
GLUCOSE-CAPILLARY: 138 mg/dL — AB (ref 65–99)
GLUCOSE-CAPILLARY: 146 mg/dL — AB (ref 65–99)
GLUCOSE-CAPILLARY: 162 mg/dL — AB (ref 65–99)
Glucose-Capillary: 132 mg/dL — ABNORMAL HIGH (ref 65–99)
Glucose-Capillary: 118 mg/dL — ABNORMAL HIGH (ref 65–99)
Glucose-Capillary: 111 mg/dL — ABNORMAL HIGH (ref 65–99)
Glucose-Capillary: 126 mg/dL — ABNORMAL HIGH (ref 65–99)
Glucose-Capillary: 117 mg/dL — ABNORMAL HIGH (ref 65–99)

## 2017-01-13 LAB — POCT I-STAT, CHEM 8
BUN: 18 mg/dL (ref 6–20)
CALCIUM ION: 1.16 mmol/L (ref 1.15–1.40)
CHLORIDE: 99 mmol/L — AB (ref 101–111)
Creatinine, Ser: 1.5 mg/dL — ABNORMAL HIGH (ref 0.61–1.24)
GLUCOSE: 138 mg/dL — AB (ref 65–99)
HCT: 40 % (ref 39.0–52.0)
Hemoglobin: 13.6 g/dL (ref 13.0–17.0)
Potassium: 5.1 mmol/L (ref 3.5–5.1)
SODIUM: 135 mmol/L (ref 135–145)
TCO2: 24 mmol/L (ref 0–100)

## 2017-01-13 LAB — CBC
HEMATOCRIT: 38.9 % — AB (ref 39.0–52.0)
HEMATOCRIT: 39.6 % (ref 39.0–52.0)
HEMOGLOBIN: 12.9 g/dL — AB (ref 13.0–17.0)
Hemoglobin: 12.6 g/dL — ABNORMAL LOW (ref 13.0–17.0)
MCH: 28.2 pg (ref 26.0–34.0)
MCH: 28.7 pg (ref 26.0–34.0)
MCHC: 32.4 g/dL (ref 30.0–36.0)
MCHC: 32.6 g/dL (ref 30.0–36.0)
MCV: 87 fL (ref 78.0–100.0)
MCV: 88 fL (ref 78.0–100.0)
PLATELETS: 135 10*3/uL — AB (ref 150–400)
Platelets: 146 10*3/uL — ABNORMAL LOW (ref 150–400)
RBC: 4.47 MIL/uL (ref 4.22–5.81)
RBC: 4.5 MIL/uL (ref 4.22–5.81)
RDW: 13.8 % (ref 11.5–15.5)
RDW: 14.1 % (ref 11.5–15.5)
WBC: 29 10*3/uL — ABNORMAL HIGH (ref 4.0–10.5)
WBC: 34.2 10*3/uL — ABNORMAL HIGH (ref 4.0–10.5)

## 2017-01-13 LAB — MAGNESIUM
Magnesium: 2.7 mg/dL — ABNORMAL HIGH (ref 1.7–2.4)
Magnesium: 2.7 mg/dL — ABNORMAL HIGH (ref 1.7–2.4)

## 2017-01-13 LAB — CREATININE, SERUM
Creatinine, Ser: 1.62 mg/dL — ABNORMAL HIGH (ref 0.61–1.24)
GFR calc Af Amer: 49 mL/min — ABNORMAL LOW (ref >60)
GFR calc non Af Amer: 42 mL/min — ABNORMAL LOW (ref >60)

## 2017-01-13 MED ORDER — LABETALOL HCL 5 MG/ML IV SOLN: 10.0000 mg | INTRAVENOUS | Status: DC | PRN

## 2017-01-13 MED ORDER — SODIUM CHLORIDE 0.9 % IV SOLN
INTRAVENOUS | Status: AC
  Filled 2017-01-13: qty 1

## 2017-01-13 MED ORDER — ENOXAPARIN SODIUM 30 MG/0.3ML ~~LOC~~ SOLN
30.0000 mg | Freq: Every day | SUBCUTANEOUS | Status: DC
Start: 2017-01-14 — End: 2017-01-17
  Administered 2017-01-14 – 2017-01-16 (×3): 30 mg via SUBCUTANEOUS
  Filled 2017-01-13 (×3): qty 0.3

## 2017-01-13 MED ORDER — ORAL CARE MOUTH RINSE
15.0000 mL | Freq: Two times a day (BID) | OROMUCOSAL | Status: DC
  Administered 2017-01-13 – 2017-01-15 (×3): 15 mL via OROMUCOSAL

## 2017-01-13 MED ORDER — INSULIN ASPART 100 UNIT/ML ~~LOC~~ SOLN
0.0000 [IU] | SUBCUTANEOUS | Status: DC
  Administered 2017-01-13 (×2): 2 [IU] via SUBCUTANEOUS
  Administered 2017-01-13: 4 [IU] via SUBCUTANEOUS
  Administered 2017-01-13 – 2017-01-14 (×2): 2 [IU] via SUBCUTANEOUS

## 2017-01-13 MED ORDER — INSULIN DETEMIR 100 UNIT/ML ~~LOC~~ SOLN
20.0000 [IU] | Freq: Once | SUBCUTANEOUS | Status: AC
  Administered 2017-01-13: 20 [IU] via SUBCUTANEOUS
  Filled 2017-01-13: qty 0.2

## 2017-01-13 MED FILL — Heparin Sodium (Porcine) Inj 1000 Unit/ML: INTRAMUSCULAR | Qty: 30 | Status: AC

## 2017-01-13 MED FILL — Magnesium Sulfate Inj 50%: INTRAMUSCULAR | Qty: 10 | Status: AC

## 2017-01-13 MED FILL — Potassium Chloride Inj 2 mEq/ML: INTRAVENOUS | Qty: 40 | Status: AC

## 2017-01-13 NOTE — Progress Notes (Signed)
Patient ID: Dale DollarOtis Wilbert Mchale Jr., male   DOB: 03/26/1946, 71 y.o.   MRN: [id number] EVENING ROUNDS NOTE :     [address]       [name] [id number]             [phone]                 1 Day Post-Op Procedure(s) (LRB): CORONARY ARTERY BYPASS GRAFTING (CABG) x four , using left internal mammary artery and left leg greater saphenous vein harvested endoscopically (N/A) INTRAOPERATIVE TRANSESOPHAGEAL ECHOCARDIOGRAM (N/A)  Total Length of Stay:  LOS: 4 days  BP 132/80 (BP Location: Right Arm)   Pulse 91   Temp 98 F (36.7 C) (Oral)   Resp 10   Ht 6' (1.829 m)   Wt 225 lb 5 oz (102.2 kg)   SpO2 94%   BMI 30.56 kg/m   .Intake/Output      07/20 0701 - 07/21 0700   P.O. 240   I.V. (mL/kg) 51.7 (0.5)   IV Piggyback 100   Total Intake(mL/kg) 391.7 (3.8)   Urine (mL/kg/hr) 330 (0.3)   Chest Tube 350   Total Output 680   Net -288.4         . sodium chloride 250 mL (01/13/17 0600)  . cefUROXime (ZINACEF)  IV 1.5 g (01/13/17 1810)  . lactated ringers 10 mL/hr at 01/13/17 0600  . lactated ringers 10 mL/hr at 01/13/17 0600     Lab Results  Component Value Date   WBC 34.2 (H) 01/13/2017   HGB 13.6 01/13/2017   HCT 40.0 01/13/2017   PLT 146 (L) 01/13/2017   GLUCOSE 138 (H) 01/13/2017   CHOL 127 01/10/2017   TRIG 154 (H) 01/10/2017   HDL 29 (L) 01/10/2017   LDLCALC 67 01/10/2017   ALT 19 01/11/2017   AST 15 01/11/2017   NA 135 01/13/2017   K 5.1 01/13/2017   CL 99 (L) 01/13/2017   CREATININE 1.50 (H) 01/13/2017   BUN 18 01/13/2017   CO2 23 01/13/2017   INR 1.39 01/12/2017   HGBA1C 6.4 (H) 01/11/2017   Stable postop Cr up slightly 1.5 , monitor uop   Delight OvensEdward B Eilis Chestnutt MD  Beeper [phone] Office [phone] 01/13/2017 7:42 PM

## 2017-01-13 NOTE — Progress Notes (Signed)
Anesthesiology Follow-up:  Awake and alert, in good spirits, neuro intact, minimal incisional pain. Hemodynamically without pressors.  VS: T- 37.3 BP- 115/87 HR- 88 (SR) RR- 19 O2 Sat 95% on 4L PA 36/14  K-5.2 Na 135 BUN/Cr. 13/1.46 glucose 116 H/H 12.6/38.9 platelets- 54135,46000  71 year old male one day S/P CABG X 4 for unstable angina. Stable post-op course so far, extubated 7 hours post-op. Creatine increased to 1.46 from baseline of 1.0. Otherwise doing well  Kipp Broodavid Chayton Murata

## 2017-01-13 NOTE — Progress Notes (Signed)
-  Progress Note  Patient Name: Dale Cook. Date of Encounter: 01/13/2017  Primary Cardiologist: Kirke Corin  Subjective   Doing well postop #1. Pain is controlled. Normal rhythm. Extubated and off pressors. No serious bleeding. Weight up 15 lbs from preop (reportedly).  Inpatient Medications    Scheduled Meds: . acetaminophen  1,000 mg Oral Q6H  . aspirin EC  325 mg Oral Daily  . atorvastatin  40 mg Oral q1800  . bisacodyl  10 mg Oral Daily   Or  . bisacodyl  10 mg Rectal Daily  . Chlorhexidine Gluconate Cloth  6 each Topical Daily  . docusate sodium  200 mg Oral Daily  . [START ON 01/14/2017] enoxaparin (LOVENOX) injection  30 mg Subcutaneous QHS  . insulin aspart  0-24 Units Subcutaneous Q4H  . insulin detemir  20 Units Subcutaneous Once  . mouth rinse  15 mL Mouth Rinse BID  . metoprolol tartrate  12.5 mg Oral BID  . [START ON 01/14/2017] pantoprazole  40 mg Oral Daily  . sodium chloride flush  10-40 mL Intracatheter Q12H  . sodium chloride flush  3 mL Intravenous Q12H   Continuous Infusions: . sodium chloride 250 mL (01/13/17 0600)  . albumin human    . cefUROXime (ZINACEF)  IV Stopped (01/13/17 4098)  . insulin (NOVOLIN-R) infusion    . lactated ringers 10 mL/hr at 01/13/17 0600  . lactated ringers 10 mL/hr at 01/13/17 0600   PRN Meds: albumin human, labetalol, metoprolol tartrate, morphine injection, ondansetron (ZOFRAN) IV, oxyCODONE, sodium chloride flush, sodium chloride flush, traMADol   Vital Signs    Vitals:   01/13/17 0600 01/13/17 0700 01/13/17 0800 01/13/17 0900  BP: 115/87 120/78 127/80   Pulse: 87 88 91 90  Resp: 19 19 20  (!) 29  Temp: 99.3 F (37.4 C) 99.1 F (37.3 C) 99 F (37.2 C)   TempSrc:   Core (Comment)   SpO2: 95% 95% 95% 95%  Weight:      Height:        Intake/Output Summary (Last 24 hours) at 01/13/17 0952 Last data filed at 01/13/17 0900  Gross per 24 hour  Intake          7143.59 ml  Output             5444 ml  Net           1699.59 ml   Filed Weights   01/11/17 0549 01/12/17 0453 01/13/17 0400  Weight: 211 lb 12.8 oz (96.1 kg) 210 lb 12.8 oz (95.6 kg) 225 lb 5 oz (102.2 kg)    Telemetry    NSR - Personally Reviewed  ECG    NSR, mild diffuse ST changes consistent with postop pericarditis - Personally Reviewed  Physical Exam  A little uncomfortable from surgical site soreness GEN: No acute distress.   Neck: No JVD Cardiac: RRR, no murmurs, rubs, or gallops. Sternotomy looks healthy Respiratory: Clear to auscultation bilaterally. GI: Soft, nontender, non-distended  MS: No edema; No deformity. Neuro:  Nonfocal  Psych: Normal affect   Labs    Chemistry Recent Labs Lab 01/11/17 0231 01/12/17 0151  01/12/17 1146  01/12/17 1304 01/12/17 1918 01/12/17 1920 01/13/17 0301  NA 133* 133*  < > 136  < > 139 139  --  135  K 4.1 4.4  < > 5.4*  < > 4.4 5.0  --  5.2*  CL 102 102  < > 99*  --   --  104  --  106  CO2 25 25  --   --   --   --   --   --  23  GLUCOSE 133* 134*  < > 139*  --  122* 129*  --  116*  BUN 17 15  < > 13  --   --  12  --  13  CREATININE 1.19 1.21  < > 0.80  --   --  1.00 1.05 1.46*  CALCIUM 8.6* 8.8*  --   --   --   --   --   --  7.7*  PROT 6.1*  --   --   --   --   --   --   --   --   ALBUMIN 3.2*  --   --   --   --   --   --   --   --   AST 15  --   --   --   --   --   --   --   --   ALT 19  --   --   --   --   --   --   --   --   ALKPHOS 66  --   --   --   --   --   --   --   --   BILITOT 1.0  --   --   --   --   --   --   --   --   GFRNONAA >60 60*  --   --   --   --   --  >60 48*  GFRAA >60 >60  --   --   --   --   --  >60 55*  ANIONGAP 6 6  --   --   --   --   --   --  6  < > = values in this interval not displayed.   Hematology Recent Labs Lab 01/12/17 1304 01/12/17 1918 01/12/17 1920 01/13/17 0301  WBC 13.6*  --  17.6* 29.0*  RBC 4.38  --  4.38 4.47  HGB 12.2*  12.5* 12.6* 12.5* 12.6*  HCT 36.0*  37.3* 37.0* 37.7* 38.9*  MCV 85.2  --  86.1 87.0   MCH 28.5  --  28.5 28.2  MCHC 33.5  --  33.2 32.4  RDW 13.4  --  13.6 13.8  PLT 111*  --  127* 135*    Cardiac EnzymesNo results for input(s): TROPONINI in the last 168 hours. No results for input(s): TROPIPOC in the last 168 hours.   BNPNo results for input(s): BNP, PROBNP in the last 168 hours.   DDimer No results for input(s): DDIMER in the last 168 hours.   Radiology    Dg Chest Port 1 View  Result Date: 01/13/2017 CLINICAL DATA:  Post CABG, chest soreness, hypertension EXAM: PORTABLE CHEST 1 VIEW COMPARISON:  Portable exam 0556 hours compared to 01/12/2017 FINDINGS: Interval removal of endotracheal and nasogastric tubes. RIGHT jugular Swan-Ganz catheter with tip projecting over main pulmonary artery at bifurcation. Stable mediastinal drain and RIGHT thoracostomy tube. Epicardial pacing wires noted. Enlargement of cardiac silhouette post CABG. Prominence of mediastinum likely related to surgery. Low lung volumes with bibasilar atelectasis. No pneumothorax or definite infiltrate. IMPRESSION: Low lung volumes with bibasilar atelectasis. Electronically Signed   By: Ulyses SouthwardMark  Boles M.D.   On: 01/13/2017 08:01   Dg Chest Port 1 View  Result Date: 01/12/2017 CLINICAL  DATA:  71 year old male status post CABG postop day 0. EXAM: PORTABLE CHEST 1 VIEW COMPARISON:  01/05/2017. FINDINGS: Portable AP semi upright view at 1306 hours. Intubated. Endotracheal tube tip at the level the clavicles. Enteric tube courses to the left abdomen, tip not included. Right IJ approach Swan-Ganz catheter, tip at the level of the main pulmonary artery. Mediastinal tube and left chest tube in place. No pneumothorax. Low lung volumes with mild bibasilar opacity. Pulmonary vascular congestion without overt edema. Sequelae of CABG. Mediastinal contours remain within normal limits. Negative visible bowel gas pattern. IMPRESSION: 1. Lines and tubes appear appropriately placed, Swan-Ganz catheter tip at the main pulmonary artery  level. 2. No pneumothorax or pulmonary edema. Low lung volumes with atelectasis. Electronically Signed   By: Odessa Fleming M.D.   On: 01/12/2017 13:20    Cardiac Studies   Left Heart Cath and Coronary Angiography     01/09/2017 [facility]  Conclusion     Prox Cx to Mid Cx lesion, 80 %stenosed.  Ost 1st Mrg to 1st Mrg lesion, 60 %stenosed.  Mid Cx lesion, 100 %stenosed.  Ost LAD to Prox LAD lesion, 95 %stenosed.  Mid LAD lesion, 60 %stenosed.  Dist LAD-2 lesion, 60 %stenosed.  Dist LAD-1 lesion, 90 %stenosed.  Ost 2nd Mrg to 2nd Mrg lesion, 90 %stenosed.  Prox RCA to Mid RCA lesion, 99 %stenosed.  The left ventricular systolic function is normal.  LV end diastolic pressure is normal.  The left ventricular ejection fraction is 55-65% by visual estimate.  1. 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 2. Normal LV systolic function and high normal left ventricular end-diastolic pressure.     Patient Profile     71 y.o. male with newly diagnosed multivessel CAD and preserved LV function, day #1 post CABG x 4   Assessment & Plan    1. CAD s/p CABG:  So far with good progress and no immediate complications.  2. Dyslipidemia: LDL at goal at 67 mg/dL, however HDL is low at 29 and TG slightly elevated at 154. Continue Lipitor 40 mg.   3. HTN: metoprolol restarted. Previously on diltiazem and ACEi, suspect will need to add one of those over the next few days.  4. Hyperglycemia: No prior h/o DM noted in record. Hgb A1c 6.3, in pre-diabetes range. May need glycemic control peri-operatively. Long term may be able to control with diet and exercise.    Signed, Thurmon Fair, MD  01/13/2017, 9:52 AM

## 2017-01-13 NOTE — Progress Notes (Addendum)
[address]       [name] [id number]             [phone]        CARDIOTHORACIC SURGERY PROGRESS NOTE   R1 Day Post-Op Procedure(s) (LRB): CORONARY ARTERY BYPASS GRAFTING (CABG) x four , using left internal mammary artery and left leg greater saphenous vein harvested endoscopically (N/A) INTRAOPERATIVE TRANSESOPHAGEAL ECHOCARDIOGRAM (N/A)  Subjective: Looks good.  No complaints.  Mild soreness in chest.  No SOB  Objective: Vital signs: BP Readings from Last 1 Encounters:  01/13/17 120/78   Pulse Readings from Last 1 Encounters:  01/13/17 88   Resp Readings from Last 1 Encounters:  01/13/17 19   Temp Readings from Last 1 Encounters:  01/13/17 99.1 F (37.3 C)    Hemodynamics: PAP: (19-47)/(10-25) 36/14 CO:  [3.5 L/min-4.9 L/min] 4.9 L/min CI:  [1.6 L/min/m2-2.3 L/min/m2] 2.3 L/min/m2  Physical Exam:  Rhythm:   sinus  Breath sounds: clear  Heart sounds:  RRR  Incisions:  Dressings dry, intact  Abdomen:  Soft, non-distended, non-tender  Extremities:  Warm, well-perfused  Chest tubes:  low volume thin serosanguinous output, no air leak    Intake/Output from previous day: 07/19 0701 - 07/20 0700 In: 6991.9 [I.V.:4886.9; Blood:455; IV Piggyback:1650] Out: 5634 [Urine:3650; Blood:1100; Chest Tube:884] Intake/Output this shift: Total I/O In: 31.7 [I.V.:31.7] Out: 60 [Chest Tube:60]  Lab Results:  CBC: Recent Labs  01/12/17 1920 01/13/17 0301  WBC 17.6* 29.0*  HGB 12.5* 12.6*  HCT 37.7* 38.9*  PLT 127* 135*    BMET:  Recent Labs  01/12/17 0151  01/12/17 1918 01/12/17 1920 01/13/17 0301  NA 133*  < > 139  --  135  K 4.4  < > 5.0  --  5.2*  CL 102  < > 104  --  106  CO2 25  --   --   --  23  GLUCOSE 134*  < > 129*  --  116*  BUN 15  < > 12  --  13  CREATININE 1.21  < > 1.00 1.05 1.46*  CALCIUM 8.8*  --   --   --  7.7*  < > = values in this interval not displayed.   PT/INR:   Recent Labs  01/12/17 1304  LABPROT  17.2*  INR 1.39    CBG (last 3)   Recent Labs  01/13/17 0305 01/13/17 0517 01/13/17 0622  GLUCAP 118* 132* 119*    ABG    Component Value Date/Time   PHART 7.304 (L) 01/12/2017 2052   PCO2ART 49.1 (H) 01/12/2017 2052   PO2ART 76.0 (L) 01/12/2017 2052   HCO3 24.1 01/12/2017 2052   TCO2 25 01/12/2017 2052   ACIDBASEDEF 2.0 01/12/2017 2052   O2SAT 92.0 01/12/2017 2052    CXR: PORTABLE CHEST 1 VIEW  COMPARISON:  Portable exam 0556 hours compared to 01/12/2017  FINDINGS: Interval removal of endotracheal and nasogastric tubes.  RIGHT jugular Swan-Ganz catheter with tip projecting over main pulmonary artery at bifurcation.  Stable mediastinal drain and RIGHT thoracostomy tube.  Epicardial pacing wires noted.  Enlargement of cardiac silhouette post CABG.  Prominence of mediastinum likely related to surgery.  Low lung volumes with bibasilar atelectasis.  No pneumothorax or definite infiltrate.  IMPRESSION: Low lung volumes with bibasilar atelectasis.   Electronically Signed   By: Ulyses SouthwardMark  Boles M.D.   On: 01/13/2017 08:01    EKG: NSR w/out acute ischemic changes    Assessment/Plan: S/P  Procedure(s) (LRB): CORONARY ARTERY BYPASS GRAFTING (CABG) x four , using left internal mammary artery and left leg greater saphenous vein harvested endoscopically (N/A) INTRAOPERATIVE TRANSESOPHAGEAL ECHOCARDIOGRAM (N/A)  Doing well POD1 Maintaining NSR w/ stable hemodynamics, no drips Breathing comfortably w/ O2 sats 96% on 4 L/min via [place] Expected post op atelectasis Expected post op acute blood loss anemia, mild Expected post op volume excess, weight reportedly 6 lbs > preop Hypertension   Mobilize  Diuresis D/C tubes later today or tomorrow, depending on output D/C lines   Purcell Nails, MD 01/13/2017 8:21 AM

## 2017-01-13 NOTE — Discharge Summary (Signed)
Physician Discharge Summary  Patient ID: Dale Cook Jr. MRN: [id number] DOB/AGE: 69/01/1946 69 y.o.  Admit date: 01/09/2017 Discharge date: 01/17/2017  Admission Diagnoses:Multivessel CAD with unstable angina  Discharge Diagnoses:  Principal Problem:   S/P CABG x 4 Active Problems:   Unstable angina (HCC)   Essential hypertension   Coronary artery disease involving native coronary artery with angina pectoris (HCC)   Hyperlipidemia with target low density lipoprotein (LDL) cholesterol less than 70 mg/dL  Patient Active Problem List   Diagnosis Date Noted  . Hyperlipidemia with target low density lipoprotein (LDL) cholesterol less than 70 mg/dL 40/98/119107/23/2018  . 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 Coronary artery disease involving native coronary artery with angina pectoris (HCC) 01/09/2017  . Unstable angina (HCC)   . Essential hypertension   . Encounter for screening colonoscopy 09/21/2016   HPI: at time of consultation:   Patient is a 71 year old African-American male with no previous history of coronary artery disease but risk factors notable for history of hypertension and hyperlipidemia. The patient states that approximately 6 months ago he began to experience symptoms of classical exertional substernal chest pain described as pressure or the sensation of a heavy weight across his mid chest which occur only with physical exertion, is associated with shortness of breath, and is always probably relieved by rest. Symptoms initially wax and wane in severity but accelerated in frequency and severity in recent weeks. Last week he had a prolonged episode of chest discomfort with activity that persisted for nearly 30 minutes.  He was evaluated by Dr. Kirke CorinArida and underwent diagnostic cardiac catheterization on 01/09/2017.  Catheterization revealed critical three-vessel coronary artery disease with preserved left ventricular systolic function. The patient [facility] for surgical consultation and treatment. The patient has not had any symptoms of chest discomfort since his catheterization.  The patient is single and lives in [place]. He has 3 grown children. 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 He has been retired for several years but continues to work part-time jobs. He has remained physically active and without any significant limitations until recently. He describes classical symptoms of accelerating frequency and severity of exertional angina. He denies any history of chest pain or chest tightness occurring at rest. He has not had nocturnal symptoms. He has had postprandial chest discomfort. Symptoms are always relieved by rest. He denies any symptoms of exertional shortness of breath other than those which occurred in the setting of chest discomfort. He denies any history of PND, orthopnea, or lower extremity edema. He has not had palpitations, dizzy spells, nor syncope.  Discharged Condition: good  Hospital Course: The patient was kept on heparin drip and cardiothoracic surgical consultation was obtained by Dr. Cornelius Moraswen. He was felt to be a candidate for surgical revascularization and on 01/12/2017 was taken the operating room where he underwent the below described procedure. He tolerated well was taken to the surgical intensive care unit in stable condition.   Postoperative hospital course: Postop day 1 he remained hemodynamically stable on no drips. He was tolerating 4 L/m of nasal cannula oxygen support. He did have some postoperative acute blood loss anemia which was mild. Initiated diuretic regimen for fluid overload. We discontinued his lines at this time. We began to mobilize the patient. Postop day 2 we increased his metoprolol and losartan for blood pressure and heart rate control. We consulted cardiology. We discontinued his  chest tubes. She was stable to transfer to  the stepdown unit at this time. He did have a mildly elevated white blood cell count which was thought to be atelectatic. Postop day 3 we continued to wean his oxygen support as tolerated. We continued his diuretic regimen for fluid overload. Cardiology continued to follow and assist with hypertensive medications. Consulted case management for possible DC to SNF versus short-term rehabilitation. On the telemetry floor, we continued to mobilize the patient. His white blood cell count remained slightly elevated with a low-grade temperature. He was tolerating room air at this time and continued to use his incentive spirometer and ambulate the unit. Postop day 5 he continued to progress. His blood pressure remained mostly controlled with cardiology assisting. Social work and case management were assisting with nursing home placement. Today he is tolerating room air, ambulating with limited assistance, his incisions are healing well and he is stable for discharge to a nursing facility.   Consults: cardiology  Significant Diagnostic Studies: angiography: cardiac cath  Treatments: surgery:  Date of Procedure:    01/12/2017  Preoperative Diagnosis:        Severe 3-vessel Coronary Artery Disease  Unstable Angina Pectoris  Postoperative Diagnosis:    Same  Procedure:        Coronary Artery Bypass Grafting x 4              Left Internal Mammary Artery to Mid Left Anterior Descending Coronary Artery             Saphenous Vein Graft to Distal Left Anterior Descending Coronary Artery             Saphenous Vein Graft to First Obtuse Marginal Branch of Left Circumflex Coronary Artery             Sequential Sapheonous Vein Graft to Second Obtuse Marginal Branch of Left Circumflex Coronary Artery             Endoscopic Vein Harvest from Left Thigh and Lower Leg  Surgeon:        Salvatore Decent. Cornelius Moras, MD  Assistant:       Rowe Clack, PA-C  Anesthesia:    Kipp Brood, MD  Operative  Findings:  Normal LV systolic function  Moderate LVH with diastolic dysfunction  Good quality LIMA conduit for grafting  Good quality SVG conduit for grafting  Good quality target vessels for grafting    Discharge Exam: Blood pressure (!) 115/103, pulse (!) 107, temperature 98.3 F (36.8 C), temperature source Axillary, resp. rate 18, height 6' (1.829 m), weight 95.6 kg (210 lb 12.8 oz), SpO2 97 %.   General appearance: alert, cooperative and no distress Heart: sinus tachycardia Lungs: clear to auscultation bilaterally Abdomen: soft, non-tender; bowel sounds normal; no masses,  no organomegaly Extremities: extremities normal, atraumatic, no cyanosis or edema Wound: clean and dry   Disposition: 70-Another Health Care Institution Not Defined  Discharge Instructions    Amb Referral to Cardiac Rehabilitation    Complete by:  As directed    Diagnosis:  CABG   CABG X ___:  4   Discharge patient    Complete by:  As directed    Home heath services   Discharge disposition:  01-Home or Self Care   Discharge patient date:  01/17/2017     Allergies as of 01/17/2017   No Known Allergies     Medication List    STOP taking these medications   diltiazem 300 MG 24 hr capsule  Commonly known as:  TIAZAC   naproxen sodium 220 MG tablet Commonly known as:  ANAPROX     TAKE these medications   aspirin 325 MG EC tablet Take 1 tablet (325 mg total) by mouth daily. What changed:  medication strength  how much to take   atorvastatin 40 MG tablet Commonly known as:  LIPITOR Take 1 tablet (40 mg total) by mouth daily.   cetirizine 10 MG tablet Commonly known as:  ZYRTEC Take 10 mg by mouth daily.   metoprolol tartrate 50 MG tablet Commonly known as:  LOPRESSOR Take 1 tablet (50 mg total) by mouth 2 (two) times daily.   moexipril 7.5 MG tablet Commonly known as:  UNIVASC Take 7.5 mg by mouth daily.   oxyCODONE 5 MG immediate release tablet Commonly known as:  Oxy  IR/ROXICODONE Take 1 tablet (5 mg total) by mouth every 4 (four) hours as needed for severe pain.      Follow-up Information    Purcell Nails, MD Follow up.   Specialty:  Cardiothoracic Surgery Why:  Appointment to see Dr. Cornelius Moras on 02/20/2017 at 2:30 PM. Please obtain a chest x-ray and [facility] at 2 PM, it is located in the same office complex. Contact information: [address] [place] [address] [phone]        Iran Ouch, MD Follow up.   Specialty:  Cardiology Why:  See discharge paperwork for cardiology follow-up appointment. Contact information: [address] [place] [address] [phone]        Derwood Kaplan, MD. Call in 1 day(s).   Specialty:  Internal Medicine Contact information: [address] [place] [address] [phone]         The patient has been discharged on:   1.Beta Blocker:  Yes [  x ]                              No   [   ]                              If No, reason:  2.Ace Inhibitor/ARB: Yes [  x ]                                     No  [    ]                                     If No, reason:  3.Statin:   Yes [ x  ]                  No  [   ]                  If No, reason:  4.Ecasa:  Yes  [ x  ]                  No   [   ]                  If No, reason:   Signed: Sharlene Dory 01/17/2017, 2:36 PM

## 2017-01-14 ENCOUNTER — Inpatient Hospital Stay (HOSPITAL_COMMUNITY): Payer: Medicare PPO

## 2017-01-14 DIAGNOSIS — E782 Mixed hyperlipidemia: Secondary | ICD-10-CM

## 2017-01-14 DIAGNOSIS — I10 Essential (primary) hypertension: Secondary | ICD-10-CM

## 2017-01-14 LAB — GLUCOSE, CAPILLARY
GLUCOSE-CAPILLARY: 117 mg/dL — AB (ref 65–99)
GLUCOSE-CAPILLARY: 122 mg/dL — AB (ref 65–99)
Glucose-Capillary: 132 mg/dL — ABNORMAL HIGH (ref 65–99)
Glucose-Capillary: 119 mg/dL — ABNORMAL HIGH (ref 65–99)

## 2017-01-14 LAB — CBC
HCT: 37.2 % — ABNORMAL LOW (ref 39.0–52.0)
HEMOGLOBIN: 12.2 g/dL — AB (ref 13.0–17.0)
MCH: 28.8 pg (ref 26.0–34.0)
MCHC: 32.8 g/dL (ref 30.0–36.0)
MCV: 87.9 fL (ref 78.0–100.0)
Platelets: 120 10*3/uL — ABNORMAL LOW (ref 150–400)
RBC: 4.23 MIL/uL (ref 4.22–5.81)
RDW: 14.3 % (ref 11.5–15.5)
WBC: 27.7 10*3/uL — AB (ref 4.0–10.5)

## 2017-01-14 LAB — BASIC METABOLIC PANEL
ANION GAP: 6 (ref 5–15)
BUN: 13 mg/dL (ref 6–20)
CALCIUM: 8.2 mg/dL — AB (ref 8.9–10.3)
CO2: 26 mmol/L (ref 22–32)
Chloride: 101 mmol/L (ref 101–111)
Creatinine, Ser: 1.27 mg/dL — ABNORMAL HIGH (ref 0.61–1.24)
GFR, EST NON AFRICAN AMERICAN: 56 mL/min — AB (ref >60)
Glucose, Bld: 123 mg/dL — ABNORMAL HIGH (ref 65–99)
POTASSIUM: 4.9 mmol/L (ref 3.5–5.1)
Sodium: 133 mmol/L — ABNORMAL LOW (ref 135–145)

## 2017-01-14 MED ORDER — TRANDOLAPRIL 1 MG PO TABS: 1.0000 mg | ORAL_TABLET | Freq: Every day | ORAL | Status: DC

## 2017-01-14 MED ORDER — MOVING RIGHT ALONG BOOK
Freq: Once | Status: AC
  Administered 2017-01-14: 1
  Filled 2017-01-14: qty 1

## 2017-01-14 MED ORDER — ASPIRIN EC 325 MG PO TBEC
325.0000 mg | DELAYED_RELEASE_TABLET | Freq: Every day | ORAL | Status: DC
  Administered 2017-01-14 – 2017-01-17 (×4): 325 mg via ORAL
  Filled 2017-01-14 (×4): qty 1

## 2017-01-14 MED ORDER — SODIUM CHLORIDE 0.9% FLUSH: 3.0000 mL | INTRAVENOUS | Status: DC | PRN

## 2017-01-14 MED ORDER — LOSARTAN POTASSIUM 50 MG PO TABS
50.0000 mg | ORAL_TABLET | Freq: Every day | ORAL | Status: DC
  Administered 2017-01-14 – 2017-01-17 (×4): 50 mg via ORAL
  Filled 2017-01-14 (×4): qty 1

## 2017-01-14 MED ORDER — POTASSIUM CHLORIDE CRYS ER 20 MEQ PO TBCR
20.0000 meq | EXTENDED_RELEASE_TABLET | Freq: Two times a day (BID) | ORAL | Status: AC
  Administered 2017-01-15 – 2017-01-16 (×4): 20 meq via ORAL
  Filled 2017-01-14: qty 2
  Filled 2017-01-14 (×3): qty 1

## 2017-01-14 MED ORDER — METOPROLOL TARTRATE 25 MG PO TABS
25.0000 mg | ORAL_TABLET | Freq: Two times a day (BID) | ORAL | Status: DC
  Administered 2017-01-14 – 2017-01-15 (×4): 25 mg via ORAL
  Filled 2017-01-14 (×5): qty 1

## 2017-01-14 MED ORDER — ASPIRIN EC 81 MG PO TBEC: 81.0000 mg | DELAYED_RELEASE_TABLET | Freq: Every day | ORAL | Status: DC

## 2017-01-14 MED ORDER — SODIUM CHLORIDE 0.9% FLUSH
3.0000 mL | Freq: Two times a day (BID) | INTRAVENOUS | Status: DC
  Administered 2017-01-14 – 2017-01-16 (×3): 3 mL via INTRAVENOUS

## 2017-01-14 MED ORDER — FUROSEMIDE 40 MG PO TABS
40.0000 mg | ORAL_TABLET | Freq: Two times a day (BID) | ORAL | Status: AC
  Administered 2017-01-14 – 2017-01-16 (×5): 40 mg via ORAL
  Filled 2017-01-14 (×5): qty 1

## 2017-01-14 MED ORDER — SODIUM CHLORIDE 0.9 % IV SOLN: 250.0000 mL | INTRAVENOUS | Status: DC | PRN

## 2017-01-14 NOTE — Progress Notes (Signed)
[address]       [name] [id number]             [phone]        CARDIOTHORACIC SURGERY PROGRESS NOTE   R2 Days Post-Op Procedure(s) (LRB): CORONARY ARTERY BYPASS GRAFTING (CABG) x four , using left internal mammary artery and left leg greater saphenous vein harvested endoscopically (N/A) INTRAOPERATIVE TRANSESOPHAGEAL ECHOCARDIOGRAM (N/A)  Subjective: Looks great.  Feels well.  Denies pain, SOB.  Just ate breakfast.  No complaints  Objective: Vital signs: BP Readings from Last 1 Encounters:  01/14/17 (!) 154/86   Pulse Readings from Last 1 Encounters:  01/14/17 93   Resp Readings from Last 1 Encounters:  01/14/17 18   Temp Readings from Last 1 Encounters:  01/14/17 98.4 F (36.9 C) (Oral)    Hemodynamics:    Physical Exam:  Rhythm:   sinus  Breath sounds: clear  Heart sounds:  RRR  Incisions:  Dressings dry, intact  Abdomen:  Soft, non-distended, non-tender  Extremities:  Warm, well-perfused  Chest tubes:  low volume thin serosanguinous output, no air leak    Intake/Output from previous day: 07/20 0701 - 07/21 0700 In: 995.7 [P.O.:480; I.V.:415.7; IV Piggyback:100] Out: 1371 [Urine:930; Chest Tube:441] Intake/Output this shift: Total I/O In: 340 [P.O.:240; IV Piggyback:100] Out: 295 [Urine:275; Chest Tube:20]  Lab Results:  CBC: Recent Labs  01/13/17 1659 01/13/17 1706 01/14/17 0330  WBC 34.2*  --  27.7*  HGB 12.9* 13.6 12.2*  HCT 39.6 40.0 37.2*  PLT 146*  --  120*    BMET:  Recent Labs  01/13/17 0301  01/13/17 1706 01/14/17 0330  NA 135  --  135 133*  K 5.2*  --  5.1 4.9  CL 106  --  99* 101  CO2 23  --   --  26  GLUCOSE 116*  --  138* 123*  BUN 13  --  18 13  CREATININE 1.46*  < > 1.50* 1.27*  CALCIUM 7.7*  --   --  8.2*  < > = values in this interval not displayed.   PT/INR:   Recent Labs  01/12/17 1304  LABPROT 17.2*  INR 1.39    CBG (last 3)   Recent Labs  01/13/17 2334 01/14/17 0327  01/14/17 0756  GLUCAP 126* 132* 117*    ABG    Component Value Date/Time   PHART 7.304 (L) 01/12/2017 2052   PCO2ART 49.1 (H) 01/12/2017 2052   PO2ART 76.0 (L) 01/12/2017 2052   HCO3 24.1 01/12/2017 2052   TCO2 24 01/13/2017 1706   ACIDBASEDEF 2.0 01/12/2017 2052   O2SAT 92.0 01/12/2017 2052    CXR: PORTABLE CHEST 1 VIEW  COMPARISON:  01/13/2017 and 01/12/2017  FINDINGS: Right IJ central venous sheath with tip over the SVC. Stable right basilar chest tube and mediastinal drain. Lungs are hypoinflated with mild bibasilar opacification unchanged likely atelectasis. No evidence of pneumothorax. Cardiomediastinal silhouette and remainder of the exam is unchanged.  IMPRESSION: Hypoinflation with stable bibasilar opacification likely atelectasis.  Tubes and lines as described.   Electronically Signed   By: Elberta Fortisaniel  Boyle M.D.   On: 01/14/2017 07:30   Assessment/Plan: S/P Procedure(s) (LRB): CORONARY ARTERY BYPASS GRAFTING (CABG) x four , using left internal mammary artery and left leg greater saphenous vein harvested endoscopically (N/A) INTRAOPERATIVE TRANSESOPHAGEAL ECHOCARDIOGRAM (N/A)  Doing very well POD2 Maintaining NSR w/ stable BP, somewhat hypertensive Breathing comfortably w/ O2 sats 96% on 2 L/min via  [place] Expected post op atelectasis, improved Expected post op acute blood loss anemia, mild Expected post op volume excess, weight up and reportedly 10 lbs > preop Hypertension Leukocytosis w/out fever, presumably reactive, starting to trend down   Metoprolol increased and Losartan added by cardiology for HTN  Mobilize  Diuresis  D/C tubes   Watch WBC  Transfer step down  Purcell Nails, MD 01/14/2017 9:22 AM

## 2017-01-14 NOTE — Progress Notes (Signed)
Patient to be transferred to 4E room 25. Report called to CiscoKari RN. Patient and family aware of transfer.

## 2017-01-14 NOTE — Progress Notes (Addendum)
Progress Note  Patient Name: Dale Cook. Date of Encounter: 01/14/2017  Primary Cardiologist: Kirke Corin   Subjective   71 yo with CAD , S/P CABG Doing well   Inpatient Medications    Scheduled Meds: . acetaminophen  1,000 mg Oral Q6H  . aspirin EC  325 mg Oral Daily  . atorvastatin  40 mg Oral q1800  . bisacodyl  10 mg Oral Daily   Or  . bisacodyl  10 mg Rectal Daily  . Chlorhexidine Gluconate Cloth  6 each Topical Daily  . docusate sodium  200 mg Oral Daily  . enoxaparin (LOVENOX) injection  30 mg Subcutaneous QHS  . insulin aspart  0-24 Units Subcutaneous Q4H  . mouth rinse  15 mL Mouth Rinse BID  . metoprolol tartrate  12.5 mg Oral BID  . pantoprazole  40 mg Oral Daily  . sodium chloride flush  10-40 mL Intracatheter Q12H  . sodium chloride flush  3 mL Intravenous Q12H   Continuous Infusions: . sodium chloride 250 mL (01/13/17 2000)  . cefUROXime (ZINACEF)  IV 1.5 g (01/14/17 0845)  . lactated ringers Stopped (01/13/17 2000)  . lactated ringers Stopped (01/13/17 2000)   PRN Meds: labetalol, metoprolol tartrate, morphine injection, ondansetron (ZOFRAN) IV, oxyCODONE, sodium chloride flush, sodium chloride flush, traMADol   Vital Signs    Vitals:   01/14/17 0438 01/14/17 0500 01/14/17 0600 01/14/17 0801  BP:  140/80 (!) 145/83   Pulse:  90 93   Resp:  16 12   Temp:    98.4 F (36.9 C)  TempSrc:    Oral  SpO2:  95% 94%   Weight: 229 lb 11.5 oz (104.2 kg)     Height:        Intake/Output Summary (Last 24 hours) at 01/14/17 0850 Last data filed at 01/14/17 0800  Gross per 24 hour  Intake              864 ml  Output             1606 ml  Net             -742 ml   Filed Weights   01/12/17 0453 01/13/17 0400 01/14/17 0438  Weight: 210 lb 12.8 oz (95.6 kg) 225 lb 5 oz (102.2 kg) 229 lb 11.5 oz (104.2 kg)    Telemetry    NSR  - Personally Reviewed  ECG     NSR  - Personally Reviewed  Physical Exam   GEN: No acute distress.   Neck: No  JVD Cardiac: RRR, no murmurs, , ? Soft rub,  or gallops.   Chest tubes in place  Respiratory: Clear to auscultation bilaterally. GI: Soft, nontender, non-distended  MS: No edema; No deformity. Neuro:  Nonfocal  Psych: Normal affect   Labs    Chemistry Recent Labs Lab 01/11/17 0231 01/12/17 0151  01/13/17 0301 01/13/17 1659 01/13/17 1706 01/14/17 0330  NA 133* 133*  < > 135  --  135 133*  K 4.1 4.4  < > 5.2*  --  5.1 4.9  CL 102 102  < > 106  --  99* 101  CO2 25 25  --  23  --   --  26  GLUCOSE 133* 134*  < > 116*  --  138* 123*  BUN 17 15  < > 13  --  18 13  CREATININE 1.19 1.21  < > 1.46* 1.62* 1.50* 1.27*  CALCIUM 8.6* 8.8*  --  7.7*  --   --  8.2*  PROT 6.1*  --   --   --   --   --   --   ALBUMIN 3.2*  --   --   --   --   --   --   AST 15  --   --   --   --   --   --   ALT 19  --   --   --   --   --   --   ALKPHOS 66  --   --   --   --   --   --   BILITOT 1.0  --   --   --   --   --   --   GFRNONAA >60 60*  < > 48* 42*  --  56*  GFRAA >60 >60  < > 55* 49*  --  >60  ANIONGAP 6 6  --  6  --   --  6  < > = values in this interval not displayed.   Hematology Recent Labs Lab 01/13/17 0301 01/13/17 1659 01/13/17 1706 01/14/17 0330  WBC 29.0* 34.2*  --  27.7*  RBC 4.47 4.50  --  4.23  HGB 12.6* 12.9* 13.6 12.2*  HCT 38.9* 39.6 40.0 37.2*  MCV 87.0 88.0  --  87.9  MCH 28.2 28.7  --  28.8  MCHC 32.4 32.6  --  32.8  RDW 13.8 14.1  --  14.3  PLT 135* 146*  --  120*    Cardiac EnzymesNo results for input(s): TROPONINI in the last 168 hours. No results for input(s): TROPIPOC in the last 168 hours.   BNPNo results for input(s): BNP, PROBNP in the last 168 hours.   DDimer No results for input(s): DDIMER in the last 168 hours.   Radiology    Dg Chest Port 1 View  Result Date: 01/14/2017 CLINICAL DATA:  Atelectasis. EXAM: PORTABLE CHEST 1 VIEW COMPARISON:  01/13/2017 and 01/12/2017 FINDINGS: Right IJ central venous sheath with tip over the SVC. Stable right basilar  chest tube and mediastinal drain. Lungs are hypoinflated with mild bibasilar opacification unchanged likely atelectasis. No evidence of pneumothorax. Cardiomediastinal silhouette and remainder of the exam is unchanged. IMPRESSION: Hypoinflation with stable bibasilar opacification likely atelectasis. Tubes and lines as described. Electronically Signed   By: Elberta Fortisaniel  Boyle M.D.   On: 01/14/2017 07:30   Dg Chest Port 1 View  Result Date: 01/13/2017 CLINICAL DATA:  Post CABG, chest soreness, hypertension EXAM: PORTABLE CHEST 1 VIEW COMPARISON:  Portable exam 0556 hours compared to 01/12/2017 FINDINGS: Interval removal of endotracheal and nasogastric tubes. RIGHT jugular Swan-Ganz catheter with tip projecting over main pulmonary artery at bifurcation. Stable mediastinal drain and RIGHT thoracostomy tube. Epicardial pacing wires noted. Enlargement of cardiac silhouette post CABG. Prominence of mediastinum likely related to surgery. Low lung volumes with bibasilar atelectasis. No pneumothorax or definite infiltrate. IMPRESSION: Low lung volumes with bibasilar atelectasis. Electronically Signed   By: Ulyses SouthwardMark  Boles M.D.   On: 01/13/2017 08:01   Dg Chest Port 1 View  Result Date: 01/12/2017 CLINICAL DATA:  71 year old male status post CABG postop day 0. EXAM: PORTABLE CHEST 1 VIEW COMPARISON:  01/05/2017. FINDINGS: Portable AP semi upright view at 1306 hours. Intubated. Endotracheal tube tip at the level the clavicles. Enteric tube courses to the left abdomen, tip not included. Right IJ approach Swan-Ganz catheter, tip at the level of the main pulmonary artery. Mediastinal tube  and left chest tube in place. No pneumothorax. Low lung volumes with mild bibasilar opacity. Pulmonary vascular congestion without overt edema. Sequelae of CABG. Mediastinal contours remain within normal limits. Negative visible bowel gas pattern. IMPRESSION: 1. Lines and tubes appear appropriately placed, Swan-Ganz catheter tip at the main  pulmonary artery level. 2. No pneumothorax or pulmonary edema. Low lung volumes with atelectasis. Electronically Signed   By: Odessa Fleming M.D.   On: 01/12/2017 13:20    Cardiac Studies     Patient Profile     71 y.o. male , hx of HTN, CAD S/p CABG   Assessment & Plan    1.  CAD :  S/p CABG on 7/19.  Feels much better 2. Hyperlipidemia :  Continue atorva 3.  HTN:   Will increase metoprolol to 25 BID,   Add Losartan 50 mg a day    Signed, Kristeen Miss, MD  01/14/2017, 8:50 AM

## 2017-01-14 NOTE — Progress Notes (Signed)
CARDIAC REHAB PHASE I   PRE:  Rate/Rhythm: 87 SR  BP:  Supine:   Sitting: 105/68  Standing:    SaO2: 96% 2L  MODE:  Ambulation: 370 ft   POST:  Rate/Rhythm: 103 ST  BP:  Supine:   Sitting: 141/91  Standing:    SaO2: 96% 2L  1410-1442 Patient tolerated ambulation well with assist x2 and pushing rolling walker. Gait slow, steady, no c/o. To bed after walk.   Artist Paislinty M Roseanna Koplin, MS, ACSM CEP

## 2017-01-14 NOTE — Progress Notes (Signed)
[facility] BRIEF SICU PROGRESS NOTE  2 Days Post-Op  S/P Procedure(s) (LRB): CORONARY ARTERY BYPASS GRAFTING (CABG) x four , using left internal mammary artery and left leg greater saphenous vein harvested endoscopically (N/A) INTRAOPERATIVE TRANSESOPHAGEAL ECHOCARDIOGRAM (N/A)   Stable day  Plan: Awaiting bed for transfer  Purcell Nailslarence H Owen, MD 01/14/2017 4:55 PM

## 2017-01-15 ENCOUNTER — Inpatient Hospital Stay (HOSPITAL_COMMUNITY): Payer: Medicare PPO

## 2017-01-15 LAB — BASIC METABOLIC PANEL
Anion gap: 6 (ref 5–15)
BUN: 18 mg/dL (ref 6–20)
CO2: 27 mmol/L (ref 22–32)
CREATININE: 1.37 mg/dL — AB (ref 0.61–1.24)
Calcium: 7.9 mg/dL — ABNORMAL LOW (ref 8.9–10.3)
Chloride: 100 mmol/L — ABNORMAL LOW (ref 101–111)
GFR calc Af Amer: 60 mL/min — ABNORMAL LOW (ref >60)
GFR, EST NON AFRICAN AMERICAN: 51 mL/min — AB (ref >60)
GLUCOSE: 117 mg/dL — AB (ref 65–99)
Potassium: 4.1 mmol/L (ref 3.5–5.1)
SODIUM: 133 mmol/L — AB (ref 135–145)

## 2017-01-15 LAB — CBC
HCT: 32.5 % — ABNORMAL LOW (ref 39.0–52.0)
Hemoglobin: 11 g/dL — ABNORMAL LOW (ref 13.0–17.0)
MCH: 28.8 pg (ref 26.0–34.0)
MCHC: 33.8 g/dL (ref 30.0–36.0)
MCV: 85.1 fL (ref 78.0–100.0)
PLATELETS: 121 10*3/uL — AB (ref 150–400)
RBC: 3.82 MIL/uL — ABNORMAL LOW (ref 4.22–5.81)
RDW: 14 % (ref 11.5–15.5)
WBC: 23.6 10*3/uL — ABNORMAL HIGH (ref 4.0–10.5)

## 2017-01-15 NOTE — Progress Notes (Signed)
      [address]       [name] [id number]             [phone]     CARDIOTHORACIC SURGERY PROGRESS NOTE  3 Days Post-Op  S/P Procedure(s) (LRB): CORONARY ARTERY BYPASS GRAFTING (CABG) x four , using left internal mammary artery and left leg greater saphenous vein harvested endoscopically (N/A) INTRAOPERATIVE TRANSESOPHAGEAL ECHOCARDIOGRAM (N/A)  Subjective: Looks good and feels well.  No pain, SOB  Objective: Vital signs in last 24 hours: Temp:  [97.9 F (36.6 C)-99.1 F (37.3 C)] 97.9 F (36.6 C) (07/22 0823) Pulse Rate:  [87-99] 97 (07/22 0823) Cardiac Rhythm: Normal sinus rhythm (07/22 0728) Resp:  [13-29] 16 (07/22 0544) BP: (98-157)/(63-79) 135/75 (07/22 0823) SpO2:  [91 %-100 %] 99 % (07/22 0823) Weight:  [221 lb 4.8 oz (100.4 kg)] 221 lb 4.8 oz (100.4 kg) (07/22 0544)  Physical Exam:  Rhythm:   sinus  Breath sounds: clear  Heart sounds:  RRR  Incisions:  Clean and dry  Abdomen:  Soft, non-distended, non-tender  Extremities:  Warm, well-perfused    Intake/Output from previous day: 07/21 0701 - 07/22 0700 In: 1520.3 [P.O.:1330; I.V.:90.3; IV Piggyback:100] Out: 2120 [Urine:2100; Chest Tube:20] Intake/Output this shift: No intake/output data recorded.  Lab Results:  Recent Labs  01/14/17 0330 01/15/17 0143  WBC 27.7* 23.6*  HGB 12.2* 11.0*  HCT 37.2* 32.5*  PLT 120* 121*   BMET:  Recent Labs  01/14/17 0330 01/15/17 0143  NA 133* 133*  K 4.9 4.1  CL 101 100*  CO2 26 27  GLUCOSE 123* 117*  BUN 13 18  CREATININE 1.27* 1.37*  CALCIUM 8.2* 7.9*    CBG (last 3)   Recent Labs  01/14/17 0756 01/14/17 1202 01/14/17 1619  GLUCAP 117* 122* 119*   PT/INR:   Recent Labs  01/12/17 1304  LABPROT 17.2*  INR 1.39    CXR:  CHEST  2 VIEW  COMPARISON:  Portable exam 0635 hours compared to 01/14/2017  FINDINGS: Interval removal of RIGHT jugular line, mediastinal drain, and BILATERAL thoracostomy tubes Enlargement of  cardiac silhouette post CABG.  Mediastinal contours and pulmonary vascularity normal.  Bibasilar atelectasis.  Lungs otherwise clear.  No pleural effusion or pneumothorax.  BILATERAL AC joint degenerative changes and suspected chronic rotator cuff tears.  IMPRESSION: Bibasilar atelectasis.  No pneumothoraces following removal of pilon chest tubes.   Electronically Signed   By: Ulyses SouthwardMark  Boles M.D.   On: 01/15/2017 08:11   Assessment/Plan: S/P Procedure(s) (LRB): CORONARY ARTERY BYPASS GRAFTING (CABG) x four , using left internal mammary artery and left leg greater saphenous vein harvested endoscopically (N/A) INTRAOPERATIVE TRANSESOPHAGEAL ECHOCARDIOGRAM (N/A)  Doing very well POD3 Maintaining NSR w/ stable BP, although still somewhat hypertensive Breathing comfortably w/ O2 sats 98% on 2 L/min via [place] Expected post op atelectasis, improved Expected post op acute blood loss anemia, mild Expected post op volume excess, weight down 8 lbs but still reportedly 2lbs >preop Hypertension Leukocytosis w/out fever, presumably reactive, trending down   Defer management of hypertension to Cardiology team since they started new agent yesterday rather than restarting patient's previous home meds  Mobilize  Diuresis  Watch WBC  Consult case management - patient wants possible d/c to SNF for short term rehab  Purcell Nailslarence H Owen, MD 01/15/2017 10:44 AM

## 2017-01-15 NOTE — Progress Notes (Signed)
Pacing wires removed per order 

## 2017-01-16 ENCOUNTER — Encounter (HOSPITAL_COMMUNITY): Payer: Self-pay | Admitting: Cardiology

## 2017-01-16 DIAGNOSIS — E785 Hyperlipidemia, unspecified: Secondary | ICD-10-CM | POA: Diagnosis present

## 2017-01-16 LAB — URINALYSIS, ROUTINE W REFLEX MICROSCOPIC
BACTERIA UA: NONE SEEN
BILIRUBIN URINE: NEGATIVE
Glucose, UA: NEGATIVE mg/dL
KETONES UR: NEGATIVE mg/dL
Leukocytes, UA: NEGATIVE
Nitrite: NEGATIVE
PH: 5 (ref 5.0–8.0)
PROTEIN: 100 mg/dL — AB
Specific Gravity, Urine: 1.026 (ref 1.005–1.030)

## 2017-01-16 MED ORDER — METOPROLOL TARTRATE 50 MG PO TABS
50.0000 mg | ORAL_TABLET | Freq: Two times a day (BID) | ORAL | Status: DC
  Administered 2017-01-16 – 2017-01-17 (×3): 50 mg via ORAL
  Filled 2017-01-16 (×3): qty 1

## 2017-01-16 MED FILL — Heparin Sodium (Porcine) Inj 1000 Unit/ML: INTRAMUSCULAR | Qty: 20 | Status: AC

## 2017-01-16 MED FILL — Electrolyte-R (PH 7.4) Solution: INTRAVENOUS | Qty: 6000 | Status: AC

## 2017-01-16 MED FILL — Sodium Bicarbonate IV Soln 8.4%: INTRAVENOUS | Qty: 50 | Status: AC

## 2017-01-16 MED FILL — Mannitol IV Soln 20%: INTRAVENOUS | Qty: 500 | Status: AC

## 2017-01-16 MED FILL — Sodium Chloride IV Soln 0.9%: INTRAVENOUS | Qty: 2000 | Status: AC

## 2017-01-16 MED FILL — Albumin, Human Inj 5%: INTRAVENOUS | Qty: 250 | Status: AC

## 2017-01-16 MED FILL — Lidocaine HCl IV Inj 20 MG/ML: INTRAVENOUS | Qty: 5 | Status: AC

## 2017-01-16 NOTE — Progress Notes (Signed)
CARDIAC REHAB PHASE I   PRE:  Rate/Rhythm: 102 ST  BP:  Sitting: 126/83        SaO2: 97 RA  MODE:  Ambulation: 470 ft   POST:  Rate/Rhythm: 120 ST  BP:  Sitting: 171/84         SaO2: 95-100 RA  Pt ambulated 470 ft on RA, hand held assist, steady gait, tolerated well. Pt with mild DOE, denies any other complaints, standing rest x1. Encouraged additional ambulation x2 today. Pt to recliner after walk, call bell within reach. Will follow.   [phone] Joylene GrapesEmily C Rayder Sullenger, RN, BSN 01/16/2017 11:07 AM

## 2017-01-16 NOTE — Progress Notes (Addendum)
[address]       [place] [address]             [phone]      4 Days Post-Op Procedure(s) (LRB): CORONARY ARTERY BYPASS GRAFTING (CABG) x four , using left internal mammary artery and left leg greater saphenous vein harvested endoscopically (N/A) INTRAOPERATIVE TRANSESOPHAGEAL ECHOCARDIOGRAM (N/A) Subjective: Feels well   Objective: Vital signs in last 24 hours: Temp:  [97.9 F (36.6 C)-99.8 F (37.7 C)] 98.6 F (37 C) (07/23 0350) Pulse Rate:  [91-103] 95 (07/23 0350) Cardiac Rhythm: Normal sinus rhythm (07/22 2122) Resp:  [22-25] 25 (07/23 0350) BP: (112-146)/(68-77) 112/70 (07/23 0350) SpO2:  [94 %-99 %] 94 % (07/23 0350) Weight:  [215 lb 3.2 oz (97.6 kg)] 215 lb 3.2 oz (97.6 kg) (07/23 0350)  Hemodynamic parameters for last 24 hours:    Intake/Output from previous day: 07/22 0701 - 07/23 0700 In: 9.2 [I.V.:9.2] Out: 2850 [Urine:2850] Intake/Output this shift: No intake/output data recorded.  General appearance: alert, cooperative and no distress Heart: regular rate and rhythm Lungs: clear to auscultation bilaterally Abdomen: benign Extremities: no edema Wound: incis healing well  Lab Results:  Recent Labs  01/14/17 0330 01/15/17 0143  WBC 27.7* 23.6*  HGB 12.2* 11.0*  HCT 37.2* 32.5*  PLT 120* 121*   BMET:  Recent Labs  01/14/17 0330 01/15/17 0143  NA 133* 133*  K 4.9 4.1  CL 101 100*  CO2 26 27  GLUCOSE 123* 117*  BUN 13 18  CREATININE 1.27* 1.37*  CALCIUM 8.2* 7.9*    PT/INR: No results for input(s): LABPROT, INR in the last 72 hours. ABG    Component Value Date/Time   PHART 7.304 (L) 01/12/2017 2052   HCO3 24.1 01/12/2017 2052   TCO2 24 01/13/2017 1706   ACIDBASEDEF 2.0 01/12/2017 2052   O2SAT 92.0 01/12/2017 2052   CBG (last 3)   Recent Labs  01/14/17 0756 01/14/17 1202 01/14/17 1619  GLUCAP 117* 122* 119*    Meds Scheduled Meds: . acetaminophen  1,000 mg Oral Q6H  . aspirin EC  325 mg  Oral Daily  . atorvastatin  40 mg Oral q1800  . bisacodyl  10 mg Oral Daily   Or  . bisacodyl  10 mg Rectal Daily  . Chlorhexidine Gluconate Cloth  6 each Topical Daily  . docusate sodium  200 mg Oral Daily  . enoxaparin (LOVENOX) injection  30 mg Subcutaneous QHS  . furosemide  40 mg Oral BID  . losartan  50 mg Oral Daily  . mouth rinse  15 mL Mouth Rinse BID  . metoprolol tartrate  25 mg Oral BID  . pantoprazole  40 mg Oral Daily  . potassium chloride  20 mEq Oral BID  . sodium chloride flush  10-40 mL Intracatheter Q12H  . sodium chloride flush  3 mL Intravenous Q12H  . sodium chloride flush  3 mL Intravenous Q12H   Continuous Infusions: . sodium chloride Stopped (01/14/17 1055)  . sodium chloride Stopped (01/14/17 1055)  . lactated ringers Stopped (01/13/17 2000)  . lactated ringers Stopped (01/13/17 2000)   PRN Meds:.sodium chloride, labetalol, metoprolol tartrate, morphine injection, ondansetron (ZOFRAN) IV, oxyCODONE, sodium chloride flush, sodium chloride flush, sodium chloride flush, traMADol  Xrays Dg Chest 2 View  Result Date: 01/15/2017 CLINICAL DATA:  Atelectasis, coronary artery disease post CABG on 01/11/2017, hypertension EXAM: CHEST  2 VIEW COMPARISON:  Portable exam 0635 hours compared to 01/14/2017 FINDINGS: Interval  removal of RIGHT jugular line, mediastinal drain, and BILATERAL thoracostomy tubes Enlargement of cardiac silhouette post CABG. Mediastinal contours and pulmonary vascularity normal. Bibasilar atelectasis. Lungs otherwise clear. No pleural effusion or pneumothorax. BILATERAL AC joint degenerative changes and suspected chronic rotator cuff tears. IMPRESSION: Bibasilar atelectasis. No pneumothoraces following removal of pilon chest tubes. Electronically Signed   By: Ulyses SouthwardMark  Boles M.D.   On: 01/15/2017 08:11    Assessment/Plan: S/P Procedure(s) (LRB): CORONARY ARTERY BYPASS GRAFTING (CABG) x four , using left internal mammary artery and left leg greater  saphenous vein harvested endoscopically (N/A) INTRAOPERATIVE TRANSESOPHAGEAL ECHOCARDIOGRAM (N/A)  1 doing well, he has nobody who can stay with him at discharge- will see if SW can assist with short term rehab at SNF 2 no new labs - recheck in am , WBC still elevated yesterday- Tm 99.8, check UA also 3 sinus rhythm, BP control is pretty good 4 cont to push rehab/pulm toilet- routine  LOS: 7 days    GOLD,WAYNE E 01/16/2017   I have seen and examined the patient and agree with the assessment and plan as outlined.  Tentatively plan d/c tomorrow if satisfactory arrangements in place.  Will ask Cardiology team to address meds for hypertension on D/C since Dr Elease HashimotoNahser started a new ARB rather than resuming preop ACE-I - Dr Kirke CorinArida is patient's primary cardiologist.  Purcell Nailslarence H Owen, MD 01/16/2017 7:57 AM

## 2017-01-16 NOTE — Care Management Important Message (Signed)
Important Message  Patient Details  Name: Dale DollarOtis Wilbert Slone Jr. MRN: [id number] Date of Birth: 01/26/1946   Medicare Important Message Given:  Yes    Kyla BalzarineShealy, Lauryl Seyer Abena 01/16/2017, 9:27 AM

## 2017-01-16 NOTE — Progress Notes (Addendum)
Progress Note  Patient Name: Dale Cook. Date of Encounter: 01/16/2017  Primary Cardiologist: DR. Fletcher Anon  Subjective   No chest pain and no SOB  Inpatient Medications    Scheduled Meds: . acetaminophen  1,000 mg Oral Q6H  . aspirin EC  325 mg Oral Daily  . atorvastatin  40 mg Oral q1800  . bisacodyl  10 mg Oral Daily   Or  . bisacodyl  10 mg Rectal Daily  . Chlorhexidine Gluconate Cloth  6 each Topical Daily  . docusate sodium  200 mg Oral Daily  . enoxaparin (LOVENOX) injection  30 mg Subcutaneous QHS  . furosemide  40 mg Oral BID  . losartan  50 mg Oral Daily  . mouth rinse  15 mL Mouth Rinse BID  . metoprolol tartrate  25 mg Oral BID  . pantoprazole  40 mg Oral Daily  . potassium chloride  20 mEq Oral BID  . sodium chloride flush  10-40 mL Intracatheter Q12H  . sodium chloride flush  3 mL Intravenous Q12H  . sodium chloride flush  3 mL Intravenous Q12H   Continuous Infusions: . sodium chloride Stopped (01/14/17 1055)  . sodium chloride Stopped (01/14/17 1055)  . lactated ringers Stopped (01/13/17 2000)  . lactated ringers Stopped (01/13/17 2000)   PRN Meds: sodium chloride, labetalol, metoprolol tartrate, morphine injection, ondansetron (ZOFRAN) IV, oxyCODONE, sodium chloride flush, sodium chloride flush, sodium chloride flush, traMADol   Vital Signs    Vitals:   01/15/17 2122 01/16/17 0051 01/16/17 0350 01/16/17 0839  BP: 126/68 123/74 112/70 (!) 145/80  Pulse: 98 95 95   Resp:  (!) 23 (!) 25   Temp:  98.8 F (37.1 C) 98.6 F (37 C) 98.2 F (36.8 C)  TempSrc:  Oral Oral Oral  SpO2:  96% 94%   Weight:   215 lb 3.2 oz (97.6 kg)   Height:        Intake/Output Summary (Last 24 hours) at 01/16/17 1110 Last data filed at 01/16/17 0840  Gross per 24 hour  Intake           129.17 ml  Output             2850 ml  Net         -2720.83 ml   Filed Weights   01/14/17 0438 01/15/17 0544 01/16/17 0350  Weight: 229 lb 11.5 oz (104.2 kg) 221 lb 4.8  oz (100.4 kg) 215 lb 3.2 oz (97.6 kg)    Telemetry    SR - Personally Reviewed  ECG    No new - Personally Reviewed  Physical Exam   GEN: No acute distress.   Neck: No JVD Cardiac: RRR, no murmurs, rubs, or gallops.  Respiratory: Clear to auscultation bilaterally. GI: Soft, nontender, non-distended  MS: No edema; No deformity. Chest wall incision stable  Neuro:  Nonfocal  Psych: Normal affect   Labs    Chemistry Recent Labs Lab 01/11/17 0231  01/13/17 0301 01/13/17 1659 01/13/17 1706 01/14/17 0330 01/15/17 0143  NA 133*  < > 135  --  135 133* 133*  K 4.1  < > 5.2*  --  5.1 4.9 4.1  CL 102  < > 106  --  99* 101 100*  CO2 25  < > 23  --   --  26 27  GLUCOSE 133*  < > 116*  --  138* 123* 117*  BUN 17  < > 13  --  18 13 18  CREATININE 1.19  < > 1.46* 1.62* 1.50* 1.27* 1.37*  CALCIUM 8.6*  < > 7.7*  --   --  8.2* 7.9*  PROT 6.1*  --   --   --   --   --   --   ALBUMIN 3.2*  --   --   --   --   --   --   AST 15  --   --   --   --   --   --   ALT 19  --   --   --   --   --   --   ALKPHOS 66  --   --   --   --   --   --   BILITOT 1.0  --   --   --   --   --   --   GFRNONAA >60  < > 48* 42*  --  56* 51*  GFRAA >60  < > 55* 49*  --  >60 60*  ANIONGAP 6  < > 6  --   --  6 6  < > = values in this interval not displayed.   Hematology  Recent Labs Lab 01/13/17 1659 01/13/17 1706 01/14/17 0330 01/15/17 0143  WBC 34.2*  --  27.7* 23.6*  RBC 4.50  --  4.23 3.82*  HGB 12.9* 13.6 12.2* 11.0*  HCT 39.6 40.0 37.2* 32.5*  MCV 88.0  --  87.9 85.1  MCH 28.7  --  28.8 28.8  MCHC 32.6  --  32.8 33.8  RDW 14.1  --  14.3 14.0  PLT 146*  --  120* 121*    Cardiac EnzymesNo results for input(s): TROPONINI in the last 168 hours. No results for input(s): TROPIPOC in the last 168 hours.   BNPNo results for input(s): BNP, PROBNP in the last 168 hours.   DDimer No results for input(s): DDIMER in the last 168 hours.   Radiology    Dg Chest 2 View  Result Date:  01/15/2017 CLINICAL DATA:  Atelectasis, coronary artery disease post CABG on 01/11/2017, hypertension EXAM: CHEST  2 VIEW COMPARISON:  Portable exam 0635 hours compared to 01/14/2017 FINDINGS: Interval removal of RIGHT jugular line, mediastinal drain, and BILATERAL thoracostomy tubes Enlargement of cardiac silhouette post CABG. Mediastinal contours and pulmonary vascularity normal. Bibasilar atelectasis. Lungs otherwise clear. No pleural effusion or pneumothorax. BILATERAL AC joint degenerative changes and suspected chronic rotator cuff tears. IMPRESSION: Bibasilar atelectasis. No pneumothoraces following removal of pilon chest tubes. Electronically Signed   By: Lavonia Dana M.D.   On: 01/15/2017 08:11    Cardiac Studies   01/09/17 cath  Procedures   Left Heart Cath and Coronary Angiography  Conclusion     Prox Cx to Mid Cx lesion, 80 %stenosed.  Ost 1st Mrg to 1st Mrg lesion, 60 %stenosed.  Mid Cx lesion, 100 %stenosed.  Ost LAD to Prox LAD lesion, 95 %stenosed.  Mid LAD lesion, 60 %stenosed.  Dist LAD-2 lesion, 60 %stenosed.  Dist LAD-1 lesion, 90 %stenosed.  Ost 2nd Mrg to 2nd Mrg lesion, 90 %stenosed.  Prox RCA to Mid RCA lesion, 99 %stenosed.  The left ventricular systolic function is normal.  LV end diastolic pressure is normal.  The left ventricular ejection fraction is 55-65% by visual estimate.   1. 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 2. Normal LV systolic function and high normal left ventricular end-diastolic pressure.  Recommendations: Overall  difficult revascularization options given that the diffuse disease. PCI will require long segments of stenting especially the LAD. Also the left circumflex disease is a bifurcation stenosis involving 2 large OM branches. CABG is limited by significant distal LAD disease. Given severity of disease, the patient will need to be hospitalized and  started on a heparin drip 8 hours after sheath pull. I switched diltiazem to metoprolol. Continue atorvastatin. Transferr to [facility] for a heart team decision and consultation with CVTS. It might be possible to place a graft on both mid and distal LAD.   ECHO 01/11/17 ------------------------------------------------------------------- Study Conclusions  - Left ventricle: The cavity size was normal. Wall thickness was normal. Systolic function was vigorous. The estimated ejection   fraction was in the range of 65% to 70%. - Mitral valve: Calcified annulus. Mildly thickened leaflets .  Dopplers 01/11/17  No ICA stenosis   01/12/17  PRE-OPERATIVE DIAGNOSIS:  CAD  POST-OPERATIVE DIAGNOSIS:  CAD  PROCEDURE:  Procedure(s): CORONARY ARTERY BYPASS GRAFTING (CABG) x four , using left internal mammary artery and left leg greater saphenous vein harvested endoscopically (N/A) INTRAOPERATIVE TRANSESOPHAGEAL ECHOCARDIOGRAM (N/A) LIMA-MID LAD SVG-DIST LAD SVG(SEQ) -OM1-OM2  Intra op TEE 01/12/17  Impressions:  - No change from pre-bypass images. Post-bypass Impression:     1. Aortic Valve: Trace AI, unchanged from pre-bypass     2. Mitral Valve: Moderate MAC, trace MR, unchanged from   pre-bypass.     3. LV: EF 5--55% no regional wall motion abnormalities. No   intra-cardiac air noted.     4. RV: Normal size, normal RV systolic function     5. Tricuspid valve: normal appearing leaflets, trace TR.  Patient Profile     71 y.o. male hx of HTN, CAD s/p CABG 01/12/17.    Assessment & Plan    Principal Problem:   S/P CABG x 4 Active Problems:   Coronary artery disease involving native coronary artery with angina pectoris (HCC)   Unstable angina (HCC)   Hyperlipidemia with target low density lipoprotein (LDL) cholesterol less than 70 mg/dL   Essential hypertension   CAD s/p CABG POD  #4 using left internal mammary artery and left leg greater saphenous vein harvested endoscopically  (N/A)  Plan for D/c tomorrow will arrange follow up with Dr. Fletcher Anon post op.   HTN pt was on Univasc as outpt he could go back to this, hospital does not carry.  On lopressor 25 BID   HLD on statin    Signed, Cecilie Kicks, NP  01/16/2017, 11:10 AM    I have seen, examined and evaluated the patient this AM along with Valentina Shaggy.  After reviewing all the available data and chart, we discussed the patients laboratory, study & physical findings as well as symptoms in detail. I agree with her findings, examination as well as impression recommendations as per our discussion.    Attending adjustments noted in italics.   Progress well postop with no major complaints. No angina or heart failure. Discharge is being delayed because he would like to go to an outpatient rehabilitation facility discharge since he lives alone.  Cardiology has been assisting with postop management. He is on a beta blocker as opposed to calcium channel blocker changed over on initial admission. We can likely titrate the dose of metoprolol up To 50 twice a day given his tachycardia and ongoing hypertension.  He was started on losartan 50 mg a couple days ago as opposed to restarting his home dose ACE inhibitor because  it is not not on the formulary. Recommendation would be to simply restart his home dose of ACE inhibitor on discharge to avoid confusion. Continue losartan while inpatient as this is roughly the same equivalent dosing.  Otherwise he is stable on aspirin, statin. On oral Lasix, appears euvolemic. Will defer to CT surgery for management, but I don't think he will need to be on it long-term.  Anticipate discharge to outpatient rehabilitation center soon   Glenetta Hew, M.D., M.S. Interventional Cardiologist   Pager # [phone] Phone # [phone] [address]. [place], [place] [address]

## 2017-01-17 LAB — BASIC METABOLIC PANEL
Anion gap: 11 (ref 5–15)
BUN: 21 mg/dL — AB (ref 6–20)
CALCIUM: 8.4 mg/dL — AB (ref 8.9–10.3)
CO2: 25 mmol/L (ref 22–32)
CREATININE: 1.31 mg/dL — AB (ref 0.61–1.24)
Chloride: 101 mmol/L (ref 101–111)
GFR calc Af Amer: >60 mL/min (ref >60)
GFR, EST NON AFRICAN AMERICAN: 54 mL/min — AB (ref >60)
Glucose, Bld: 108 mg/dL — ABNORMAL HIGH (ref 65–99)
POTASSIUM: 4.3 mmol/L (ref 3.5–5.1)
SODIUM: 137 mmol/L (ref 135–145)

## 2017-01-17 LAB — CBC
HCT: 35.7 % — ABNORMAL LOW (ref 39.0–52.0)
Hemoglobin: 12 g/dL — ABNORMAL LOW (ref 13.0–17.0)
MCH: 28.5 pg (ref 26.0–34.0)
MCHC: 33.6 g/dL (ref 30.0–36.0)
MCV: 84.8 fL (ref 78.0–100.0)
PLATELETS: 252 10*3/uL (ref 150–400)
RBC: 4.21 MIL/uL — AB (ref 4.22–5.81)
RDW: 14.4 % (ref 11.5–15.5)
WBC: 14.9 10*3/uL — ABNORMAL HIGH (ref 4.0–10.5)

## 2017-01-17 MED ORDER — OXYCODONE HCL 5 MG PO TABS: 5.0000 mg | ORAL_TABLET | ORAL | 0 refills | Status: DC | PRN

## 2017-01-17 MED ORDER — ASPIRIN 325 MG PO TBEC: 325.0000 mg | DELAYED_RELEASE_TABLET | Freq: Every day | ORAL | 0 refills | Status: DC

## 2017-01-17 MED ORDER — METOPROLOL TARTRATE 50 MG PO TABS: 50.0000 mg | ORAL_TABLET | Freq: Two times a day (BID) | ORAL | 1 refills | Status: DC

## 2017-01-17 NOTE — Progress Notes (Addendum)
      [address]       [place] [id number]             [phone]      5 Days Post-Op Procedure(s) (LRB): CORONARY ARTERY BYPASS GRAFTING (CABG) x four , using left internal mammary artery and left leg greater saphenous vein harvested endoscopically (N/A) INTRAOPERATIVE TRANSESOPHAGEAL ECHOCARDIOGRAM (N/A) Subjective: No issues overnight.   Objective: Vital signs in last 24 hours: Temp:  [98 F (36.7 C)-98.7 F (37.1 C)] 98.1 F (36.7 C) (07/24 0400) Pulse Rate:  [103] 103 (07/23 1215) Cardiac Rhythm: Normal sinus rhythm (07/24 0700) BP: (141-145)/(80-92) 141/92 (07/23 1215) SpO2:  [100 %] 100 % (07/23 1215) Weight:  [95.6 kg (210 lb 12.8 oz)] 95.6 kg (210 lb 12.8 oz) (07/24 0400)    Intake/Output from previous day: 07/23 0701 - 07/24 0700 In: 360 [P.O.:360] Out: 1225 [Urine:1225] Intake/Output this shift: Total I/O In: -  Out: 400 [Urine:400]  General appearance: alert, cooperative and no distress Heart: sinus tachycardia Lungs: clear to auscultation bilaterally Abdomen: soft, non-tender; bowel sounds normal; no masses,  no organomegaly Extremities: extremities normal, atraumatic, no cyanosis or edema Wound: clean and dry  Lab Results:  Recent Labs  01/15/17 0143 01/17/17 0635  WBC 23.6* 14.9*  HGB 11.0* 12.0*  HCT 32.5* 35.7*  PLT 121* 252   BMET:  Recent Labs  01/15/17 0143  NA 133*  K 4.1  CL 100*  CO2 27  GLUCOSE 117*  BUN 18  CREATININE 1.37*  CALCIUM 7.9*    PT/INR: No results for input(s): LABPROT, INR in the last 72 hours. ABG    Component Value Date/Time   PHART 7.304 (L) 01/12/2017 2052   HCO3 24.1 01/12/2017 2052   TCO2 24 01/13/2017 1706   ACIDBASEDEF 2.0 01/12/2017 2052   O2SAT 92.0 01/12/2017 2052   CBG (last 3)   Recent Labs  01/14/17 1202 01/14/17 1619  GLUCAP 122* 119*    Assessment/Plan: S/P Procedure(s) (LRB): CORONARY ARTERY BYPASS GRAFTING (CABG) x four , using left internal mammary artery  and left leg greater saphenous vein harvested endoscopically (N/A) INTRAOPERATIVE TRANSESOPHAGEAL ECHOCARDIOGRAM (N/A)  1. CV-NSR in the 90s. BP mostly well controlled. Cardiology assisting. On Cozaar, Lopressor, and Lipitor. Will switch to home ACEI on discharge.   2. Pulm-tolerating room air with good oxygen saturations. Last CXR showed bibasilar atelectasis and no pneumothorax.  3. Renal-no recent labs, creatinine has been stable.  4. H and H trending up. Platelets continue to rise.  5. Dispo: SW assisting with SNF placement. Will discharge once arrangements have been made.     LOS: 8 days    Sharlene Doryessa N Conte 01/17/2017  I have seen and examined the patient and agree with the assessment and plan as outlined.  Ready for d/c.  Resume Univasc and stop losartan at d/c.  Will not resume ditiazem CD at this time.  Purcell Nailslarence H Owen, MD 01/17/2017 2:51 PM

## 2017-01-17 NOTE — Care Management Note (Signed)
Case Management Note  Patient Details  Name: Dale DollarOtis Wilbert Fishbaugh Jr. MRN: [id number] Date of Birth: 08/27/1945  Subjective/Objective:        Admitted for CABG with PMHx: HTN . From home alone.            Nafifitti Steinhardt (Daughter) Cyril LoosenCrystal A Fritzler (Daughter)     [phone]      PCP: Toy CookeyErnest Eason  Action/Plan: Plan is to d/c to home with home health services (RN) today.  Expected Discharge Date:  01/17/17               Expected Discharge Plan:  Home w Home Health Services  In-House Referral:     Discharge planning Services  CM Consult  Post Acute Care Choice:    Choice offered to:  Patient  DME Arranged:    DME Agency:     HH Arranged:  RN HH Agency:  [facility]  Status of Service:  Completed, signed off  If discussed at Long Length of Stay Meetings, dates discussed:    Additional Comments:  Epifanio LeschesCole, Jerie Basford Hudson, RN 01/17/2017, 3:06 PM

## 2017-01-17 NOTE — Evaluation (Signed)
Physical Therapy Evaluation/discharge Patient Details Name: Dale Cook. MRN: [id number] DOB: 1945-10-27 Today's Date: 01/17/2017   History of Present Illness  71 yo admitted for CABG with PMHx: HTN  Clinical Impression  Pt is moving exceptionally well. Pt able to walk 800' without SOB, significant change in HR or fatigue. Pt reports son can stay in the evenings and daughters can assist with meals and laundry. Pt able to state 3/5 precautions and educated for all with pt maintaining all precautions throughout mobility. No assist required for mobility, stairs or gait and pt with bil LE strength 5/5. Encouraged continued daily ambulation with progression discussed. No further therapy needs at this time with pt aware and agreeable.   HR 93-107    Follow Up Recommendations No PT follow up    Equipment Recommendations  None recommended by PT    Recommendations for Other Services       Precautions / Restrictions Precautions Precautions: Sternal      Mobility  Bed Mobility Overal bed mobility: Modified Independent                Transfers Overall transfer level: Modified independent                  Ambulation/Gait Ambulation/Gait assistance: Independent Ambulation Distance (Feet): 800 Feet Assistive device: None Gait Pattern/deviations: WFL(Within Functional Limits)   Gait velocity interpretation: at or above normal speed for age/gender    Stairs Stairs: Yes Stairs assistance: Modified independent (Device/Increase time) Stair Management: Alternating pattern;Forwards Number of Stairs: 11 General stair comments: good stability no cues needed  Wheelchair Mobility    Modified Weinmann (Stroke Patients Only)       Balance Overall balance assessment: No apparent balance deficits (not formally assessed)                                           Pertinent Vitals/Pain Pain Assessment: No/denies pain    Home Living  Family/patient expects to be discharged to:: Private residence Living Arrangements: Alone Available Help at Discharge: Family;Available PRN/intermittently Type of Home: Apartment Home Access: Stairs to enter Entrance Stairs-Rails: Right;Left;Can reach both Entrance Stairs-Number of Steps: 14 Home Layout: One level Home Equipment: None      Prior Function Level of Independence: Independent               Hand Dominance        Extremity/Trunk Assessment   Upper Extremity Assessment Upper Extremity Assessment: Overall WFL for tasks assessed    Lower Extremity Assessment Lower Extremity Assessment: Overall WFL for tasks assessed (5/5 bil LE)    Cervical / Trunk Assessment Cervical / Trunk Assessment: Normal  Communication   Communication: No difficulties  Cognition Arousal/Alertness: Awake/alert Behavior During Therapy: WFL for tasks assessed/performed Overall Cognitive Status: Within Functional Limits for tasks assessed                                        General Comments      Exercises     Assessment/Plan    PT Assessment Patent does not need any further PT services  PT Problem List         PT Treatment Interventions      PT Goals (Current goals can be found in the Care Plan section)  Acute Rehab PT Goals PT Goal Formulation: All assessment and education complete, DC therapy    Frequency     Barriers to discharge        Co-evaluation               AM-PAC PT "6 Clicks" Daily Activity  Outcome Measure Difficulty turning over in bed (including adjusting bedclothes, sheets and blankets)?: None Difficulty moving from lying on back to sitting on the side of the bed? : None Difficulty sitting down on and standing up from a chair with arms (e.g., wheelchair, bedside commode, etc,.)?: None Help needed moving to and from a bed to chair (including a wheelchair)?: None Help needed walking in hospital room?: None Help needed  climbing 3-5 steps with a railing? : None 6 Click Score: 24    End of Session Equipment Utilized During Treatment: Gait belt Activity Tolerance: Patient tolerated treatment well Patient left: in chair;with call bell/phone within reach Nurse Communication: Mobility status PT Visit Diagnosis: Difficulty in walking, not elsewhere classified (R26.2)    Time: 1300-1313 PT Time Calculation (min) (ACUTE ONLY): 13 min   Charges:   PT Evaluation $PT Eval Low Complexity: 1 Procedure     PT G Codes:        Delaney MeigsMaija Tabor Evo Aderman, PT [phone]   Cammeron Greis B Geneve Kimpel 01/17/2017, 1:18 PM

## 2017-01-17 NOTE — NC FL2 (Signed)
[place] MEDICAID FL2 LEVEL OF CARE SCREENING TOOL     IDENTIFICATION  Patient Name: Dale Cook. Birthdate: [birth year] Sex: male Admission Date (Current Location): 01/09/2017  [facility] and [place] Number:  Producer, television/film/video and Address:  [facility], [address] [place], [place] [address]      Provider Number: [id number]  Attending Physician Name and Address:  Purcell Nails, MD  Relative Name and Phone Number:       Current Level of Care: Hospital Recommended Level of Care: Skilled Nursing Facility Prior Approval Number:    Date Approved/Denied:   PASRR Number: [id number]  Discharge Plan: SNF    Current Diagnoses: Patient Active Problem List   Diagnosis Date Noted  . Hyperlipidemia with target low density lipoprotein (LDL) cholesterol less than 70 mg/dL 96/29/5284  . 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 Coronary artery disease involving native coronary artery with angina pectoris (HCC) 01/09/2017  . Unstable angina (HCC)   . Essential hypertension   . Encounter for screening colonoscopy 09/21/2016    Orientation RESPIRATION BLADDER Height & Weight     Self, Time, Situation, Place  Normal Continent Weight: 210 lb 12.8 oz (95.6 kg) Height:  6' (182.9 cm)  BEHAVIORAL SYMPTOMS/MOOD NEUROLOGICAL BOWEL NUTRITION STATUS      Continent Diet (cardiac)  AMBULATORY STATUS COMMUNICATION OF NEEDS Skin   Limited Assist Verbally Surgical wounds                       Personal Care Assistance Level of Assistance  Bathing, Dressing Bathing Assistance: Limited assistance   Dressing Assistance: Limited assistance     Functional Limitations Info             SPECIAL CARE FACTORS FREQUENCY  PT (By licensed PT), OT (By licensed OT)     PT Frequency: 5/wk OT Frequency: 5/wk            Contractures      Additional Factors Info  Code Status, Allergies Code Status Info: FULL Allergies Info: NKA           Current  Medications (01/17/2017):  This is the current hospital active medication list Current Facility-Administered Medications  Medication Dose Route Frequency Provider Last Rate Last Dose  . 0.9 %  sodium chloride infusion  250 mL Intravenous Continuous Purcell Nails, MD   Stopped at 01/14/17 1055  . 0.9 %  sodium chloride infusion  250 mL Intravenous PRN Purcell Nails, MD   Stopped at 01/14/17 1055  . acetaminophen (TYLENOL) tablet 1,000 mg  1,000 mg Oral Q6H Purcell Nails, MD   1,000 mg at 01/17/17 1156  . aspirin EC tablet 325 mg  325 mg Oral Daily Purcell Nails, MD   325 mg at 01/17/17 0941  . atorvastatin (LIPITOR) tablet 40 mg  40 mg Oral q1800 Purcell Nails, MD   40 mg at 01/16/17 1941  . bisacodyl (DULCOLAX) EC tablet 10 mg  10 mg Oral Daily Purcell Nails, MD   10 mg at 01/15/17 0935   Or  . bisacodyl (DULCOLAX) suppository 10 mg  10 mg Rectal Daily Purcell Nails, MD      . Chlorhexidine Gluconate Cloth 2 % PADS 6 each  6 each Topical Daily Purcell Nails, MD   6 each at 01/17/17 1000  . docusate sodium (COLACE) capsule 200 mg  200 mg Oral Daily Purcell Nails, MD  200 mg at 01/17/17 0941  . enoxaparin (LOVENOX) injection 30 mg  30 mg Subcutaneous QHS Purcell Nailswen, Clarence H, MD   30 mg at 01/16/17 2126  . labetalol (NORMODYNE,TRANDATE) injection 10 mg  10 mg Intravenous Q10 min PRN Purcell Nailswen, Clarence H, MD      . lactated ringers infusion   Intravenous Continuous Purcell Nailswen, Clarence H, MD   Stopped at 01/13/17 2000  . lactated ringers infusion   Intravenous Continuous Purcell Nailswen, Clarence H, MD   Stopped at 01/13/17 2000  . losartan (COZAAR) tablet 50 mg  50 mg Oral Daily Nahser, Deloris PingPhilip J, MD   50 mg at 01/17/17 0941  . MEDLINE mouth rinse  15 mL Mouth Rinse BID Purcell Nailswen, Clarence H, MD   15 mL at 01/15/17 2141  . metoprolol tartrate (LOPRESSOR) injection 2.5-5 mg  2.5-5 mg Intravenous Q2H PRN Purcell Nailswen, Clarence H, MD   2.5 mg at 01/12/17 1351  . metoprolol tartrate (LOPRESSOR) tablet 50 mg  50 mg  Oral BID Marykay LexHarding, David W, MD   50 mg at 01/17/17 0941  . morphine 4 MG/ML injection 1-2 mg  1-2 mg Intravenous Q1H PRN Purcell Nailswen, Clarence H, MD   2 mg at 01/14/17 1020  . ondansetron (ZOFRAN) injection 4 mg  4 mg Intravenous Q6H PRN Purcell Nailswen, Clarence H, MD      . oxyCODONE (Oxy IR/ROXICODONE) immediate release tablet 5-10 mg  5-10 mg Oral Q3H PRN Purcell Nailswen, Clarence H, MD   10 mg at 01/13/17 2112  . pantoprazole (PROTONIX) EC tablet 40 mg  40 mg Oral Daily Purcell Nailswen, Clarence H, MD   40 mg at 01/17/17 0941  . sodium chloride flush (NS) 0.9 % injection 10-40 mL  10-40 mL Intracatheter Q12H Purcell Nailswen, Clarence H, MD   10 mL at 01/14/17 1007  . sodium chloride flush (NS) 0.9 % injection 10-40 mL  10-40 mL Intracatheter PRN Purcell Nailswen, Clarence H, MD      . sodium chloride flush (NS) 0.9 % injection 3 mL  3 mL Intravenous Q12H Purcell Nailswen, Clarence H, MD   3 mL at 01/17/17 0941  . sodium chloride flush (NS) 0.9 % injection 3 mL  3 mL Intravenous PRN Purcell Nailswen, Clarence H, MD      . sodium chloride flush (NS) 0.9 % injection 3 mL  3 mL Intravenous Q12H Purcell Nailswen, Clarence H, MD   3 mL at 01/16/17 1216  . sodium chloride flush (NS) 0.9 % injection 3 mL  3 mL Intravenous PRN Purcell Nailswen, Clarence H, MD      . traMADol Janean Sark(ULTRAM) tablet 50-100 mg  50-100 mg Oral Q4H PRN Purcell Nailswen, Clarence H, MD   100 mg at 01/14/17 1223     Discharge Medications: Please see discharge summary for a list of discharge medications.  Relevant Imaging Results:  Relevant Lab Results:   Additional Information SS#: [id number]  Burna SisUris, Gwendola Hornaday H, LCSW

## 2017-01-17 NOTE — Progress Notes (Signed)
CARDIAC REHAB PHASE I   PRE:  Rate/Rhythm: 102 ST  BP:  Sitting: 126/83        SaO2: 97 RA  MODE:  Ambulation: 470 ft   POST:  Rate/Rhythm: 91 SR  BP:  Sitting: 149/97         SaO2: 98 RA  Pt already walked this morning, awaiting PT. Cardiac surgery discharge education completed. Reviewed IS, sternal precautions, activity progression, exercise, heart healthy and diabetes diet handouts, daily weights and phase 2 cardiac rehab. Pt verbalized understanding. Pt agrees to phase 2 cardiac rehab referral, [facility]. Pt in recliner, call bell within reach.   [phone] Joylene GrapesEmily C Dwayn Moravek, RN, BSN 01/17/2017 12:09 PM

## 2017-01-17 NOTE — Progress Notes (Addendum)
2:30pm PT has evaluated pt and recommended no PT follow up- insurance will definitely not approve pt to go to rehab center  CSW informed pt who expressed understanding and is comfortable going home- has daughters who can be with him in the evenings after work and a brother who he states he can ask to come some during the day  CSW updated TCTS PA and RNCM  11am CSW follow for discharge disposition- consulted for SNF placement due to patient being alone at home.  CSW met with pt who confirms desire to go to rehab center rather than go home due to lack of assistance at home.  CSW informed pt of possibility of getting denied by insurance for SNF admission due to cardiac rehab note stating walking 47f  CSW called TCTS PA to inform that we will need PT/OT eval in order to even try to get insurance authorization- informed of doubts about insurance approving stay  CSW will continue to follow  JJorge Ny LTrimontSocial Worker [phone]

## 2017-01-17 NOTE — Discharge Instructions (Signed)
Endoscopic Saphenous Vein Harvesting, Care After  Refer to this sheet in the next few weeks. These instructions provide you with information about caring for yourself after your procedure. Your health care provider may also give you more specific instructions. Your treatment has been planned according to current medical practices, but problems sometimes occur. Call your health care provider if you have any problems or questions after your procedure. What can I expect after the procedure? After the procedure, it is common to have:  Pain.  Bruising.  Swelling.  Numbness.  Follow these instructions at home: Medicine  Take over-the-counter and prescription medicines only as told by your health care provider.  Do not drive or operate heavy machinery while taking prescription pain medicine. Incision care   Follow instructions from your health care provider about how to take care of the cut made during surgery (incision). Make sure you: ? Wash your hands with soap and water before you change your bandage (dressing). If soap and water are not available, use hand sanitizer. ? Change your dressing as told by your health care provider. ? Leave stitches (sutures), skin glue, or adhesive strips in place. These skin closures may need to be in place for 2 weeks or longer. If adhesive strip edges start to loosen and curl up, you may trim the loose edges. Do not remove adhesive strips completely unless your health care provider tells you to do that.  Check your incision area every day for signs of infection. Check for: ? More redness, swelling, or pain. ? More fluid or blood. ? Warmth. ? Pus or a bad smell. General instructions  Raise (elevate) your legs above the level of your heart while you are sitting or lying down.  Do any exercises your health care providers have given you. These may include deep breathing, coughing, and walking exercises.  Do not shower, take baths, swim, or use a hot  tub unless told by your health care provider.  Wear your elastic stocking if told by your health care provider.  Keep all follow-up visits as told by your health care provider. This is important. Contact a health care provider if:  Medicine does not help your pain.  Your pain gets worse.  You have new leg bruises or your leg bruises get bigger.  You have a fever.  Your leg feels numb.  You have more redness, swelling, or pain around your incision.  You have more fluid or blood coming from your incision.  Your incision feels warm to the touch.  You have pus or a bad smell coming from your incision. Get help right away if:  Your pain is severe.  You develop pain, tenderness, warmth, redness, or swelling in any part of your leg.  You have chest pain.  You have trouble breathing. This information is not intended to replace advice given to you by your health care provider. Make sure you discuss any questions you have with your health care provider. Document Released: 02/23/2011 Document Revised: 11/19/2015 Document Reviewed: 04/27/2015 Elsevier Interactive Patient Education  2018 [place]. Coronary Artery Bypass Grafting, Care After These instructions give you information on caring for yourself after your procedure. Your doctor may also give you more specific instructions. Call your doctor if you have any problems or questions after your procedure. Follow these instructions at home:  Only take medicine as told by your doctor. Take medicines exactly as told. Do not stop taking medicines or start any new medicines without talking to your doctor  first.  Take your pulse as told by your doctor.  Do deep breathing as told by your doctor. Use your breathing device (incentive spirometer), if given, to practice deep breathing several times a day. Support your chest with a pillow or your arms when you take deep breaths or cough.  Keep the area clean, dry, and protected where the  surgery cuts (incisions) were made. Remove bandages (dressings) only as told by your doctor. If strips were applied to surgical area, do not take them off. They fall off on their own.  Check the surgery area daily for puffiness (swelling), redness, or leaking fluid.  If surgery cuts were made in your legs: ? Avoid crossing your legs. ? Avoid sitting for long periods of time. Change positions every 30 minutes. ? Raise your legs when you are sitting. Place them on pillows.  Wear stockings that help keep blood clots from forming in your legs (compression stockings).  Only take sponge baths until your doctor says it is okay to take showers. Pat the surgery area dry. Do not rub the surgery area with a washcloth or towel. Do not bathe, swim, or use a hot tub until your doctor says it is okay.  Eat foods that are high in fiber. These include raw fruits and vegetables, whole grains, beans, and nuts. Choose lean meats. Avoid canned, processed, and fried foods.  Drink enough fluids to keep your pee (urine) clear or pale yellow.  Weigh yourself every day.  Rest and limit activity as told by your doctor. You may be told to: ? Stop any activity if you have chest pain, shortness of breath, changes in heartbeat, or dizziness. Get help right away if this happens. ? Move around often for short amounts of time or take short walks as told by your doctor. Gradually become more active. You may need help to strengthen your muscles and build endurance. ? Avoid lifting, pushing, or pulling anything heavier than 10 pounds (4.5 kg) for at least 6 weeks after surgery.  Do not drive until your doctor says it is okay.  Ask your doctor when you can go back to work.  Ask your doctor when you can begin sexual activity again.  Follow up with your doctor as told. Contact a doctor if:  You have puffiness, redness, more pain, or fluid draining from the incision site.  You have a fever.  You have puffiness in your  ankles or legs.  You have pain in your legs.  You gain 2 or more pounds (0.9 kg) a day.  You feel sick to your stomach (nauseous) or throw up (vomit).  You have watery poop (diarrhea). Get help right away if:  You have chest pain that goes to your jaw or arms.  You have shortness of breath.  You have a fast or irregular heartbeat.  You notice a "clicking" in your breastbone when you move.  You have numbness or weakness in your arms or legs.  You feel dizzy or light-headed. This information is not intended to replace advice given to you by your health care provider. Make sure you discuss any questions you have with your health care provider. Document Released: 06/18/2013 Document Revised: 11/19/2015 Document Reviewed: 11/20/2012 Elsevier Interactive Patient Education  2017 Elsevier Inc.    Coronary Artery Bypass Grafting, Care After  This sheet gives you information about how to care for yourself after your procedure. Your health care provider may also give you more specific instructions. If you have problems  or questions, contact your health care provider. What can I expect after the procedure? After the procedure, it is common to have:  Nausea and a lack of appetite.  Constipation.  Weakness and fatigue.  Depression or irritability.  Pain or discomfort in your incision areas.  Follow these instructions at home: Medicines  Take over-the-counter and prescription medicines only as told by your health care provider. Do not stop taking medicines or start any new medicines without approval from your health care provider.  If you were prescribed an antibiotic medicine, take it as told by your health care provider. Do not stop taking the antibiotic even if you start to feel better.  Do not drive or use heavy machinery while taking prescription pain medicine. Incision care  Follow instructions from your health care provider about how to take care of your incisions. Make  sure you: ? Wash your hands with soap and water before you change your bandage (dressing). If soap and water are not available, use hand sanitizer. ? Change your dressing as told by your health care provider. ? Leave stitches (sutures), skin glue, or adhesive strips in place. These skin closures may need to stay in place for 2 weeks or longer. If adhesive strip edges start to loosen and curl up, you may trim the loose edges. Do not remove adhesive strips completely unless your health care provider tells you to do that.  Keep incision areas clean, dry, and protected.  Check your incision areas every day for signs of infection. Check for: ? More redness, swelling, or pain. ? More fluid or blood. ? Warmth. ? Pus or a bad smell.  If incisions were made in your legs: ? Avoid crossing your legs. ? Avoid sitting for long periods of time. Change positions every 30 minutes. ? Raise (elevate) your legs when you are sitting. Bathing  Do not take baths, swim, or use a hot tub until your health care provider approves.  Only take sponge baths. Pat the incisions dry. Do not rub incisions with a washcloth or towel.  Ask your health care provider when you can shower. Eating and drinking  Eat foods that are high in fiber, such as raw fruits and vegetables, whole grains, beans, and nuts. Meats should be lean cut. Avoid canned, processed, and fried foods. This can help prevent constipation and is a recommended part of a heart-healthy diet.  Drink enough fluid to keep your urine clear or pale yellow.  Limit alcohol intake to no more than 1 drink a day for nonpregnant women and 2 drinks a day for men. One drink equals 12 oz of beer, 5 oz of wine, or 1 oz of hard liquor. Activity  Rest and limit your activity as told by your health care provider. You may be instructed to: ? Stop any activity right away if you have chest pain, shortness of breath, irregular heartbeats, or dizziness. Get help right away if  you have any of these symptoms. ? Move around frequently for short periods or take short walks as directed by your health care provider. Gradually increase your activities. You may need physical therapy or cardiac rehabilitation to help strengthen your muscles and build your endurance. ? Avoid lifting, pushing, or pulling anything that is heavier than 10 lb (4.5 kg) for at least 6 weeks or as told by your health care provider.  Do not drive until your health care provider approves.  Ask your health care provider when you may return to work.  Ask your health care provider when you may resume sexual activity. General instructions  Do not use any products that contain nicotine or tobacco, such as cigarettes and e-cigarettes. If you need help quitting, ask your health care provider.  Take 2-3 deep breaths every few hours during the day, while you recover. This helps expand your lungs and prevent complications like pneumonia after surgery.  If you were given a device called an incentive spirometer, use it several times a day to practice deep breathing. Support your chest with a pillow or your arms when you take deep breaths or cough.  Wear compression stockings as told by your health care provider. These stockings help to prevent blood clots and reduce swelling in your legs.  Weigh yourself every day. This helps identify if your body is holding (retaining) fluid that may make your heart and lungs work harder.  Keep all follow-up visits as told by your health care provider. This is important. Contact a health care provider if:  You have more redness, swelling, or pain around any incision.  You have more fluid or blood coming from any incision.  Any incision feels warm to the touch.  You have pus or a bad smell coming from any incision  You have a fever.  You have swelling in your ankles or legs.  You have pain in your legs.  You gain 2 lb (0.9 kg) or more a day.  You are nauseous or  you vomit.  You have diarrhea. Get help right away if:  You have chest pain that spreads to your jaw or arms.  You are short of breath.  You have a fast or irregular heartbeat.  You notice a "clicking" in your breastbone (sternum) when you move.  You have numbness or weakness in your arms or legs.  You feel dizzy or light-headed. Summary  After the procedure, it is common to have pain or discomfort in the incision areas.  Do not take baths, swim, or use a hot tub until your health care provider approves.  Gradually increase your activities. You may need physical therapy or cardiac rehabilitation to help strengthen your muscles and build your endurance.  Weigh yourself every day. This helps identify if your body is holding (retaining) fluid that may make your heart and lungs work harder. This information is not intended to replace advice given to you by your health care provider. Make sure you discuss any questions you have with your health care provider. Document Released: 12/31/2004 Document Revised: 05/02/2016 Document Reviewed: 05/02/2016 Elsevier Interactive Patient Education  [employer]2018 Elsevier Inc.

## 2017-01-17 NOTE — Progress Notes (Signed)
   Patient briefly seen today. Doing very well. Blood pressure appears controlled. It appears he is close to being discharged. As per yesterday's recommendation he'll be discharged back on his home ACE inhibitor but have been converted from diltiazem to metoprolol.  [facility] with Ward Givenshris Berge, NP.  We'll sign off for now as he appears to be due to being discharged either the second or tomorrow morning.  Bryan Lemmaavid Harding, MD

## 2017-01-18 NOTE — OR Nursing (Signed)
LATE ENTRY: Hole noted in saline warmer drape at end of procedure.  Procedure wound classification changed from clean to contaminated.

## 2017-01-19 NOTE — Discharge Summary (Signed)
71 year old male who presented with symptoms of unstable angina. He was referred for urgent outpatient cardiac catheterization which showed severe three-vessel coronary artery disease. The [facility].

## 2017-02-14 ENCOUNTER — Encounter: Payer: Self-pay | Admitting: Nurse Practitioner

## 2017-02-14 ENCOUNTER — Ambulatory Visit (INDEPENDENT_AMBULATORY_CARE_PROVIDER_SITE_OTHER): Payer: Medicare PPO | Admitting: Nurse Practitioner

## 2017-02-14 VITALS — BP 100/70 | HR 80 | Ht 72.0 in | Wt 205.0 lb

## 2017-02-14 DIAGNOSIS — E785 Hyperlipidemia, unspecified: Secondary | ICD-10-CM | POA: Diagnosis not present

## 2017-02-14 DIAGNOSIS — I2510 Atherosclerotic heart disease of native coronary artery without angina pectoris: Secondary | ICD-10-CM

## 2017-02-14 DIAGNOSIS — I10 Essential (primary) hypertension: Secondary | ICD-10-CM

## 2017-02-14 NOTE — Patient Instructions (Signed)
Labwork: Your physician recommends that you return for lab work in: 1 month. Go to [facility] and check in at the front desk. Make sure NOT to eat after midnight before and only a small sip of water with pills. Take lab slips with you and no appointment is needed. We will call you with results when available.   Follow-Up: Your physician recommends that you schedule a follow-up appointment in: 2-3 months with Dr. Kirke Corin.   It was a pleasure seeing you today here in the office. Please do not hesitate to give [place] a call back if you have any further questions. [phone]  Radford Cellar RN, BSN

## 2017-02-14 NOTE — Progress Notes (Signed)
Office Visit    Patient Name: Dale Cook. Date of Encounter: 02/14/2017  Primary Care Provider:  Derwood Kaplan, MD Primary Cardiologist:  Judie Petit. Kirke Corin, MD   Chief Complaint    71 year old male with a history of hypertension and hyperlipidemia who is recently status post 4 vessel bypass, and presents for follow-up.  Past Medical History    Past Medical History:  Diagnosis Date  . Arthritis    "hands, knees" (01/09/2017)  . CAD (coronary artery disease)    a. 12/2016 Cath: LM nl, LAD 95p, 27m, 60/90d, LCX 80p/m, 128m, OM1 21m, OM2 90, RCA 99p, Ef 55-65%; b. 12/2016 CABG x 4 (LIMA->mLAD, VG->dLAD, VG->OM1->OM2).  . Essential hypertension   . High cholesterol   . History of echocardiogram    a. 12/2016 Echo: EF 65-70%.   Past Surgical History:  Procedure Laterality Date  . CARDIAC CATHETERIZATION  01/09/2017   "scheduling me for OHS"  . COLONOSCOPY WITH PROPOFOL N/A 11/09/2016   Procedure: COLONOSCOPY WITH PROPOFOL;  Surgeon: Earline Mayotte, MD;  Location: [facility] ENDOSCOPY;  Service: Endoscopy;  Laterality: N/A;  . CORONARY ARTERY BYPASS GRAFT N/A 01/12/2017   CABG x 4: LIMA to mLAD, SVG to dLAD, sequential SVG to OM1-OM2, EVH via left thigh and leg;  Surgeon: Purcell Nails, MD;  Location: [facility] OR;  Service: Open Heart Surgery;  Laterality: N/A;  . INTRAOPERATIVE TRANSESOPHAGEAL ECHOCARDIOGRAM N/A 01/12/2017   Procedure: INTRAOPERATIVE TRANSESOPHAGEAL ECHOCARDIOGRAM;  Surgeon: Purcell Nails, MD;  Location: [facility] OR;  Service: Open Heart Surgery;  Laterality: N/A;  . LEFT HEART CATH AND CORONARY ANGIOGRAPHY Left 01/09/2017   Procedure: Left Heart Cath and Coronary Angiography;  Surgeon: Iran Ouch, MD;  Location: [facility] INVASIVE CV LAB;  Service: Cardiovascular;  Laterality: Left;    Allergies  No Known Allergies  History of Present Illness    71 year old male with a prior history of hypertension and hyperlipidemia. He was recently evaluated in clinic by Dr.  Kirke Corin secondary to intermittent exertional substernal chest tightness and dyspnea. Diagnostic catheterization was performed on 01/09/2017 revealed multivessel coronary artery disease as outlined above in the past medical history. He was subsequently transferred to [facility] and evaluated by thoracic surgery. He then underwent coronary artery bypass grafting 4 on July 19. Postoperative course was uncomplicated nd he was subsequently discharged home. Since discharge,he has done remarkably well.  He notes that his surgical wounds have healed well and he is not having any postoperative pain, angina, or dyspnea.He has noted low blood pressures at home with lightheadedness when standing and believes he is on too much metoprolol.  He denies PND, orthopnea, palpitations, dizziness, syncope, edema, or early satiety.  Home Medications    Prior to Admission medications   Medication Sig Start Date End Date Taking? Authorizing Provider  aspirin EC 325 MG EC tablet Take 1 tablet (325 mg total) by mouth daily. 01/18/17  Yes Conte, Tessa N, PA-C  atorvastatin (LIPITOR) 40 MG tablet Take 1 tablet (40 mg total) by mouth daily. 01/05/17 04/05/17 Yes Iran Ouch, MD  cetirizine (ZYRTEC) 10 MG tablet Take 10 mg by mouth daily.   Yes [provider]  metoprolol tartrate (LOPRESSOR) 50 MG tablet Take 1 tablet (50 mg total) by mouth 2 (two) times daily. 01/17/17  Yes Conte, Tessa N, PA-C  moexipril (UNIVASC) 7.5 MG tablet Take 7.5 mg by mouth daily.    Yes [provider]  oxyCODONE (OXY IR/ROXICODONE) 5 MG immediate release tablet  Take 1 tablet (5 mg total) by mouth every 4 (four) hours as needed for severe pain. 01/17/17  Yes Sharlene Dory, PA-C    Review of Systems    As above, he has had some lightheadedness with standing in the setting of low blood pressures. He denies chest wall pain, angina, dyspnea, palpitations, dizziness, syncope, edema, or early satiety.  All other systems reviewed and are  otherwise negative except as noted above.  Physical Exam    VS:  BP 100/70 (BP Location: Left Arm, Patient Position: Sitting, Cuff Size: Normal)   Pulse 80   Ht 6' (1.829 m)   Wt 205 lb (93 kg)   BMI 27.80 kg/m  , BMI Body mass index is 27.8 kg/m. GEN: Well nourished, well developed, in no acute distress.  HEENT: normal.  Neck: Supple, no JVD, carotid bruits, or masses. Cardiac: RRR, no murmurs, rubs, or gallops. Sternal incision is healing well without erythema or drainage.No clubbing, cyanosis, edema.  Bilateral medial lower extremity incisions are healing well without erythema or drainage.  Radials/DP/PT 2+ and equal bilaterally.  Respiratory:  Respirations regular and unlabored, clear to auscultation bilaterally. GI: Soft, nontender, nondistended, BS + x 4. MS: no deformity or atrophy. Skin: warm and dry, no rash. Neuro:  Strength and sensation are intact. Psych: Normal affect.  Accessory Clinical Findings    ECG - regular sinus rhythm, 80, left axis deviation lateral and septal T-wave inversion.  Assessment & Plan    1.  Coronary artery disease: Patient was recently evaluated for symptoms of exertional chest pain and dyspnea. Catheterization in mid July showed severe multivessel disease and he subsequently underwent CABG 4.  Echo showed normal LV function. Hospital course was uncomplicated and he has done well since discharge. His wounds are healing well.  He has had some lightheadedness in the setting of low blood pressures and therefore, I am going to reduce his metoprolol to 25 mg twice a day. He otherwise remains on full strength aspirin and high potency statin therapy.  He has been contacted by cardiac rehabilitation and does plan to enroll once cleared by thoracic surgery.  2. Essential hypertension: Blood pressures have been low at home and with that, he has noted lightheadedness. I'm reducing metoprolol to 25 mg twice a day. He will continue to track blood pressure at  home.  3. Hyperlipidemia: LDL was 67 on July 17. Continue statin therapy. Follow-up lipids and LFTs in approximately 4 more weeks.  4. Disposition: Patient has follow-up with CT surgery next week. F/u lipids/lft's in 4 wks.  I will arrange for follow-up with Dr. Kirke Corin in 2 mos.  Nicolasa Ducking, NP 02/14/2017, 12:02 PM

## 2017-02-17 ENCOUNTER — Other Ambulatory Visit: Payer: Self-pay | Admitting: Thoracic Surgery (Cardiothoracic Vascular Surgery)

## 2017-02-17 DIAGNOSIS — Z951 Presence of aortocoronary bypass graft: Secondary | ICD-10-CM

## 2017-02-19 ENCOUNTER — Other Ambulatory Visit: Payer: Self-pay | Admitting: Physician Assistant

## 2017-02-20 ENCOUNTER — Encounter: Payer: Self-pay | Admitting: Thoracic Surgery (Cardiothoracic Vascular Surgery)

## 2017-02-20 ENCOUNTER — Ambulatory Visit
Admission: RE | Admit: 2017-02-20 | Discharge: 2017-02-20 | Disposition: A | Discharge status: Home / Self Care | Payer: Medicare PPO | Source: Ambulatory Visit | Attending: Thoracic Surgery (Cardiothoracic Vascular Surgery) | Admitting: Thoracic Surgery (Cardiothoracic Vascular Surgery)

## 2017-02-20 ENCOUNTER — Ambulatory Visit (INDEPENDENT_AMBULATORY_CARE_PROVIDER_SITE_OTHER): Payer: Self-pay | Admitting: Thoracic Surgery (Cardiothoracic Vascular Surgery)

## 2017-02-20 VITALS — BP 145/88 | HR 90 | Resp 20 | Ht 72.0 in | Wt 206.0 lb

## 2017-02-20 DIAGNOSIS — Z951 Presence of aortocoronary bypass graft: Secondary | ICD-10-CM

## 2017-02-20 NOTE — Progress Notes (Signed)
[address]       [place] [address]             [phone]     CARDIOTHORACIC SURGERY OFFICE NOTE  Referring Provider is Iran Ouch, MD PCP is Derwood Kaplan, MD   HPI:  Patient is a 71 year old African-American male with history of hypertension and hyperlipidemia who returns to the office today for routine follow-up status post coronary artery bypass grafting x4 on 01/12/2017 for severe three-vessel coronary artery disease with unstable angina pectoris. His early postoperative recovery in the hospital was uneventful and he was discharged home on the fifth postoperative day.  Since hospital discharge she has done well. He was seen in follow-up by Ward Givens at [facility] on 02/14/2017. At that time his dose of metoprolol was decreased to 25 mg twice daily.  He returns to our office for routine follow-up today. He reports that he is doing exceptionally well. He no longer has any significant soreness or pain in his chest. He denies any shortness of breath. He has not had any exertional symptoms similar to those which he had prior to surgery, and overall he states that he feels "like a new man". He is delighted with his progress.   Current Outpatient Prescriptions  Medication Sig Dispense Refill  . aspirin EC 325 MG EC tablet Take 1 tablet (325 mg total) by mouth daily. 30 tablet 0  . atorvastatin (LIPITOR) 40 MG tablet Take 1 tablet (40 mg total) by mouth daily. 90 tablet 3  . cetirizine (ZYRTEC) 10 MG tablet Take 10 mg by mouth daily.    . metoprolol tartrate (LOPRESSOR) 50 MG tablet Take 1 tablet (50 mg total) by mouth 2 (two) times daily. 60 tablet 1  . moexipril (UNIVASC) 7.5 MG tablet Take 7.5 mg by mouth daily.     Marland Kitchen oxyCODONE (OXY IR/ROXICODONE) 5 MG immediate release tablet Take 1 tablet (5 mg total) by mouth every 4 (four) hours as needed for severe pain. 30 tablet 0   No current facility-administered medications for this visit.        Physical Exam:   BP (!) 145/88   Pulse 90   Resp 20   Ht 6' (1.829 m)   Wt 206 lb (93.4 kg)   SpO2 93% Comment: RA  BMI 27.94 kg/m   General:  Well-appearing  Chest:   Clear to auscultation  CV:   Regular rate and rhythm  Incisions:  Healing nicely, sternum is stable  Abdomen:  Soft and nontender  Extremities:  Warm and well-perfused  Diagnostic Tests:  CHEST  2 VIEW  COMPARISON:  PA and lateral chest x-ray of January 15, 2017  FINDINGS: The lungs are adequately inflated and clear. There is no pleural effusion or pneumothorax. The heart and pulmonary vascularity are normal. The mediastinum is normal in width. The sternal wires are intact. There is calcification in the wall of the aortic arch. The retrosternal soft tissues are normal. There is mild multilevel degenerative disc disease of the thoracic spine.  IMPRESSION: No acute cardiopulmonary abnormality.  Post CABG changes.  Thoracic aortic atherosclerosis.   Electronically Signed   By: David  [place] M.D.   On: 02/20/2017 14:07   Impression:  Patient is doing very well approximately one month status post coronary artery bypass grafting  Plan:  I have encouraged the patient to continue to gradually increase his physical activity with his primary limitation remaining that he refrain from  heavy lifting or strenuous use of his arms or shoulders for at least another 2 months. I've encouraged patient to enroll in outpatient cardiac rehabilitation program. He may resume driving an automobile. We have not recommended any changes to his current medications. All of his questions have been addressed. The patient will follow-up with Dr. Kirke Corin as previously scheduled in 2 months. He will return to our office for routine follow-up next July, one year after his surgery. He will return sooner should specific problems or questions arise.    Salvatore Decent. Cornelius Moras, MD 02/20/2017 3:02 PM

## 2017-02-20 NOTE — Patient Instructions (Addendum)
Continue to avoid any heavy lifting or strenuous use of your arms or shoulders for at least a total of three months from the time of surgery.  After three months you may gradually increase how much you lift or otherwise use your arms or chest as tolerated, with limits based upon whether or not activities lead to the return of significant discomfort.   You may return to driving an automobile as long as you are no longer requiring oral narcotic pain relievers during the daytime.  It would be wise to start driving only short distances during the daylight and gradually increase from there as you feel comfortable.  Continue all previous medications without any changes at this time.  You may take Aleve as needed for the arthritis in your knees.  You are encouraged to enroll and participate in the outpatient cardiac rehab program beginning as soon as practical.

## 2017-02-23 ENCOUNTER — Ambulatory Visit: Payer: Medicare PPO

## 2017-02-28 ENCOUNTER — Other Ambulatory Visit: Payer: Self-pay | Admitting: *Deleted

## 2017-02-28 MED ORDER — ASPIRIN 325 MG PO TBEC: 325.0000 mg | DELAYED_RELEASE_TABLET | Freq: Every day | ORAL | 2 refills | Status: DC

## 2017-02-28 NOTE — Telephone Encounter (Signed)
Pt request Rx refill for high dose Aspirin.  Please advise if ok to refill?

## 2017-03-02 ENCOUNTER — Other Ambulatory Visit: Payer: Self-pay

## 2017-03-02 NOTE — Telephone Encounter (Signed)
Need to contact pharmacy regarding refill, a new Rx was sent in on 02/28/2017 by Dr. Mariah MillingGollan for Aspirin 325 mg.

## 2017-03-17 ENCOUNTER — Other Ambulatory Visit: Payer: Self-pay | Admitting: Physician Assistant

## 2017-03-17 ENCOUNTER — Other Ambulatory Visit
Admission: RE | Admit: 2017-03-17 | Discharge: 2017-03-17 | Disposition: A | Discharge status: Home / Self Care | Payer: Medicare PPO | Source: Ambulatory Visit | Attending: Nurse Practitioner | Admitting: Nurse Practitioner

## 2017-03-17 DIAGNOSIS — I2510 Atherosclerotic heart disease of native coronary artery without angina pectoris: Secondary | ICD-10-CM | POA: Insufficient documentation

## 2017-03-17 DIAGNOSIS — E785 Hyperlipidemia, unspecified: Secondary | ICD-10-CM | POA: Insufficient documentation

## 2017-03-17 DIAGNOSIS — I10 Essential (primary) hypertension: Secondary | ICD-10-CM | POA: Diagnosis not present

## 2017-03-17 LAB — LIPID PANEL
CHOL/HDL RATIO: 3.6 ratio
Cholesterol: 112 mg/dL (ref 0–200)
HDL: 31 mg/dL — AB (ref >40)
LDL CALC: 67 mg/dL (ref 0–99)
Triglycerides: 70 mg/dL (ref <150)
VLDL: 14 mg/dL (ref 0–40)

## 2017-03-17 LAB — HEPATIC FUNCTION PANEL
ALT: 15 U/L — AB (ref 17–63)
AST: 17 U/L (ref 15–41)
Albumin: 4 g/dL (ref 3.5–5.0)
Alkaline Phosphatase: 110 U/L (ref 38–126)
Total Bilirubin: 0.8 mg/dL (ref 0.3–1.2)
Total Protein: 7.4 g/dL (ref 6.5–8.1)

## 2017-03-23 ENCOUNTER — Telehealth: Payer: Self-pay | Admitting: Nurse Practitioner

## 2017-03-23 ENCOUNTER — Other Ambulatory Visit: Payer: Self-pay

## 2017-03-23 MED ORDER — METOPROLOL TARTRATE 50 MG PO TABS: 50.0000 mg | ORAL_TABLET | Freq: Two times a day (BID) | ORAL | 0 refills | Status: DC

## 2017-03-23 NOTE — Telephone Encounter (Signed)
Requested Prescriptions   Signed Prescriptions Disp Refills  . metoprolol tartrate (LOPRESSOR) 50 MG tablet 180 tablet 0    Sig: Take 1 tablet (50 mg total) by mouth 2 (two) times daily.    Authorizing Provider: [name] A    Ordering User: Margrett Rud

## 2017-03-23 NOTE — Telephone Encounter (Signed)
°*  STAT* If patient is at the pharmacy, call can be transferred to refill team.   1. Which medications need to be refilled? (please list name of each medication and dose if known)  Metoprolol  2. Which pharmacy/location (including street and city if local pharmacy) is medication to be sent to? [facility] [address]   3. Do they need a 30 day or 90 day supply? 90 day

## 2017-04-20 ENCOUNTER — Ambulatory Visit (INDEPENDENT_AMBULATORY_CARE_PROVIDER_SITE_OTHER): Payer: Medicare PPO | Admitting: Cardiovascular Disease

## 2017-04-20 ENCOUNTER — Encounter: Payer: Self-pay | Admitting: Cardiovascular Disease

## 2017-04-20 VITALS — BP 158/90 | HR 72 | Ht 72.0 in | Wt 218.5 lb

## 2017-04-20 DIAGNOSIS — I10 Essential (primary) hypertension: Secondary | ICD-10-CM

## 2017-04-20 DIAGNOSIS — I2510 Atherosclerotic heart disease of native coronary artery without angina pectoris: Secondary | ICD-10-CM

## 2017-04-20 DIAGNOSIS — E785 Hyperlipidemia, unspecified: Secondary | ICD-10-CM | POA: Diagnosis not present

## 2017-04-20 MED ORDER — CARVEDILOL 12.5 MG PO TABS: 12.5000 mg | ORAL_TABLET | Freq: Two times a day (BID) | ORAL | 3 refills | Status: DC

## 2017-04-20 NOTE — Progress Notes (Signed)
Cardiology Office Note   Date:  04/20/2017   ID:  Dale DollarOtis Wilbert Petraglia Jr., DOB 03/11/1946, MRN [id number]  PCP:  Derwood KaplanEason, Ernest B, MD  Cardiologist:   Lorine BearsMuhammad Arida, MD   Chief Complaint  Patient presents with  . other    2-3 month f/u c/o joint paint knees. Meds reviewed verbally with pt.      History of Present Illness: Dale DollarOtis Wilbert Schopf Jr. is a 71 y.o. male who is here today for follow-up visit regarding coronary artery disease status post CABG in July 2018 for severe three-vessel coronary artery disease after presenting with unstable angina.  Echocardiogram showed normal LV systolic function with no significant valvular abnormalities.  Carotid Doppler before It showed no significant disease. He has known history of hypertension and hyperlipidemia. 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  During last visit, the dose of metoprolol was decreased to 25 mg twice daily.  It appears that he continued to take 50 mg twice daily given that his blood pressure has been running high.  He is feeling very well and denies any chest pain, shortness of breath or palpitations.  He [facility] at the [employer].  Past Medical History:  Diagnosis Date  . Arthritis    "hands, knees" (01/09/2017)  . CAD (coronary artery disease)    a. 12/2016 Cath: LM nl, LAD 95p, 6465m, 60/90d, LCX 80p/m, 15018m, OM1 8065m, OM2 90, RCA 99p, Ef 55-65%; b. 12/2016 CABG x 4 (LIMA->mLAD, VG->dLAD, VG->OM1->OM2).  . Essential hypertension   . High cholesterol   . History of echocardiogram    a. 12/2016 Echo: EF 65-70%.    Past Surgical History:  Procedure Laterality Date  . CARDIAC CATHETERIZATION  01/09/2017   "scheduling me for OHS"  . COLONOSCOPY WITH PROPOFOL N/A 11/09/2016   Procedure: COLONOSCOPY WITH PROPOFOL;  Surgeon: Earline MayotteByrnett, Jeffrey W, MD;  Location: [facility] ENDOSCOPY;  Service: Endoscopy;  Laterality: N/A;  . CORONARY ARTERY BYPASS GRAFT N/A 01/12/2017   CABG x 4: LIMA to mLAD, SVG to dLAD,  sequential SVG to OM1-OM2, EVH via left thigh and leg;  Surgeon: Purcell Nailswen, Clarence H, MD;  [facility];  Service: Open Heart Surgery;  Laterality: N/A;  . INTRAOPERATIVE TRANSESOPHAGEAL ECHOCARDIOGRAM N/A 01/12/2017   Procedure: INTRAOPERATIVE TRANSESOPHAGEAL ECHOCARDIOGRAM;  Surgeon: Purcell Nailswen, Clarence H, MD;  [facility];  Service: Open Heart Surgery;  Laterality: N/A;  . LEFT HEART CATH AND CORONARY ANGIOGRAPHY Left 01/09/2017   Procedure: Left Heart Cath and Coronary Angiography;  Surgeon: Iran OuchArida, Muhammad A, MD;  Location: [facility] INVASIVE CV LAB;  Service: Cardiovascular;  Laterality: Left;     Current Outpatient Prescriptions  Medication Sig Dispense Refill  . aspirin 325 MG EC tablet Take 1 tablet (325 mg total) by mouth daily. 90 tablet 2  . atorvastatin (LIPITOR) 40 MG tablet Take 1 tablet (40 mg total) by mouth daily. 90 tablet 3  . cetirizine (ZYRTEC) 10 MG tablet Take 10 mg by mouth daily.    . metoprolol tartrate (LOPRESSOR) 50 MG tablet Take 1 tablet (50 mg total) by mouth 2 (two) times daily. 180 tablet 0  . moexipril (UNIVASC) 7.5 MG tablet Take 7.5 mg by mouth daily.     Marland Kitchen. oxyCODONE (OXY IR/ROXICODONE) 5 MG immediate release tablet Take 1 tablet (5 mg total) by mouth every 4 (four) hours as needed for severe pain. 30 tablet 0   No current facility-administered medications for this visit.     Allergies:   Patient has  no known allergies.    Social History:  The patient  reports that he has never smoked. He has never used smokeless tobacco. He reports that he does not drink alcohol or use drugs.   Family History:  The patient's family history includes Heart attack in his mother.    ROS:  Please see the history of present illness.   Otherwise, review of systems are positive for none.   All other systems are reviewed and negative.    PHYSICAL EXAM: VS:  BP (!) 158/90 (BP Location: Left Arm, Patient Position: Sitting, Cuff Size: Normal)   Pulse 72   Ht 6' (1.829 m)   Wt 218  lb 8 oz (99.1 kg)   BMI 29.63 kg/m  , BMI Body mass index is 29.63 kg/m. GEN: Well nourished, well developed, in no acute distress  HEENT: normal  Neck: no JVD,  or masses. Left carotid bruit Cardiac: RRR; no  rubs, or gallops,no edema .  2/6 systolic ejection murmur in the aortic area which is early peaking Respiratory:  clear to auscultation bilaterally, normal work of breathing GI: soft, nontender, nondistended, + BS MS: no deformity or atrophy  Skin: warm and dry, no rash Neuro:  Strength and sensation are intact Psych: euthymic mood, full affect   EKG:  EKG is ordered today. The ekg ordered today demonstrates normal sinus rhythm with nonspecific T wave changes.   Recent Labs: 01/13/2017: Magnesium 2.7 01/17/2017: BUN 21; Creatinine, Ser 1.31; Hemoglobin 12.0; Platelets 252; Potassium 4.3; Sodium 137 03/17/2017: ALT 15    Lipid Panel    Component Value Date/Time   CHOL 112 03/17/2017 0904   TRIG 70 03/17/2017 0904   HDL 31 (L) 03/17/2017 0904   CHOLHDL 3.6 03/17/2017 0904   VLDL 14 03/17/2017 0904   LDLCALC 67 03/17/2017 0904      Wt Readings from Last 3 Encounters:  04/20/17 218 lb 8 oz (99.1 kg)  02/20/17 206 lb (93.4 kg)  02/14/17 205 lb (93 kg)      PAD Screen 01/05/2017  Previous PAD dx? No  Previous surgical procedure? No  Pain with walking? Yes  Subsides with rest? Yes  Feet/toe relief with dangling? No  Painful, non-healing ulcers? No  Extremities discolored? No      ASSESSMENT AND PLAN:  1.   Coronary artery disease involving native coronary arteries without angina: He is doing extremely well post CABG.  He is active and exercises regularly.  Continue medical therapy.  2. Left carotid bruit: Carotid Doppler before CABG showed no significant disease.  3. Hyperlipidemia: Lipid profile improved significantly with atorvastatin with a most recent LDL of 67.  4.  Essential hypertension: Blood pressure has been running high and has not been  controlled.  I elected to switch metoprolol to carvedilol 12.5 mg twice daily.   Disposition:   FU with me in 3 month  Signed,  [name], MD  04/20/2017 10:09 AM    [facility]

## 2017-04-20 NOTE — Patient Instructions (Addendum)
Medication Instructions:  Your physician has recommended you make the following change in your medication:  STOP taking metoprolol START taking coreg 12.5mg  twice daily   Labwork: none  Testing/Procedures: none  Follow-Up: Your physician recommends that you schedule a follow-up appointment in: 3 months with Dr. Kirke CorinArida.    Any Other Special Instructions Will Be Listed Below (If Applicable).     If you need a refill on your cardiac medications before your next appointment, please call your pharmacy.  Carvedilol tablets What is this medicine? CARVEDILOL (KAR ve dil ol) is a beta-blocker. Beta-blockers reduce the workload on the heart and help it to beat more regularly. This medicine is used to treat high blood pressure and heart failure. This medicine may be used for other purposes; ask your health care provider or pharmacist if you have questions. COMMON BRAND NAME(S): Coreg What should I tell my health care provider before I take this medicine? They need to know if you have any of these conditions: -circulation problems -diabetes -history of heart attack or heart disease -liver disease -lung or breathing disease, like asthma or emphysema -pheochromocytoma -slow or irregular heartbeat -thyroid disease -an unusual or allergic reaction to carvedilol, other beta-blockers, medicines, foods, dyes, or preservatives -pregnant or trying to get pregnant -breast-feeding How should I use this medicine? Take this medicine by mouth with a glass of water. Follow the directions on the prescription label. It is best to take the tablets with food. Take your doses at regular intervals. Do not take your medicine more often than directed. Do not stop taking except on the advice of your doctor or health care professional. Talk to your pediatrician regarding the use of this medicine in children. Special care may be needed. Overdosage: If you think you have taken too much of this medicine contact a  poison control center or emergency room at once. NOTE: This medicine is only for you. Do not share this medicine with others. What if I miss a dose? If you miss a dose, take it as soon as you can. If it is almost time for your next dose, take only that dose. Do not take double or extra doses. What may interact with this medicine? This medicine may interact with the following medications: -certain medicines for blood pressure, heart disease, irregular heart beat -certain medicines for depression, like fluoxetine or paroxetine -certain medicines for diabetes, like glipizide or glyburide -cimetidine -clonidine -cyclosporine -digoxin -MAOIs like Carbex, Eldepryl, Marplan, Nardil, and Parnate -reserpine -rifampin This list may not describe all possible interactions. Give your health care provider a list of all the medicines, herbs, non-prescription drugs, or dietary supplements you use. Also tell them if you smoke, drink alcohol, or use illegal drugs. Some items may interact with your medicine. What should I watch for while using this medicine? Check your heart rate and blood pressure regularly while you are taking this medicine. Ask your doctor or health care professional what your heart rate and blood pressure should be, and when you should contact him or her. Do not stop taking this medicine suddenly. This could lead to serious heart-related effects. Contact your doctor or health care professional if you have difficulty breathing while taking this drug. Check your weight daily. Ask your doctor or health care professional when you should notify him/her of any weight gain. You may get drowsy or dizzy. Do not drive, use machinery, or do anything that requires mental alertness until you know how this medicine affects you. To reduce the risk  of dizzy or fainting spells, do not sit or stand up quickly. Alcohol can make you more drowsy, and increase flushing and rapid heartbeats. Avoid alcoholic  drinks. If you have diabetes, check your blood sugar as directed. Tell your doctor if you have changes in your blood sugar while you are taking this medicine. If you are going to have surgery, tell your doctor or health care professional that you are taking this medicine. What side effects may I notice from receiving this medicine? Side effects that you should report to your doctor or health care professional as soon as possible: -allergic reactions like skin rash, itching or hives, swelling of the face, lips, or tongue -breathing problems -dark urine -irregular heartbeat -swollen legs or ankles -vomiting -yellowing of the eyes or skin Side effects that usually do not require medical attention (report to your doctor or health care professional if they continue or are bothersome): -change in sex drive or performance -diarrhea -dry eyes (especially if wearing contact lenses) -dry, itching skin -headache -nausea -unusually tired This list may not describe all possible side effects. Call your doctor for medical advice about side effects. You may report side effects to FDA at 1-800-FDA-1088. Where should I keep my medicine? Keep out of the reach of children. Store at room temperature below 30 degrees C (86 degrees F). Protect from moisture. Keep container tightly closed. Throw away any unused medicine after the expiration date. NOTE: This sheet is a summary. It may not cover all possible information. If you have questions about this medicine, talk to your doctor, pharmacist, or health care provider.  2018 Elsevier/Gold Standard (2013-02-17 14:12:02)

## 2017-07-25 ENCOUNTER — Encounter: Payer: Self-pay | Admitting: Cardiovascular Disease

## 2017-07-25 ENCOUNTER — Ambulatory Visit (INDEPENDENT_AMBULATORY_CARE_PROVIDER_SITE_OTHER): Payer: Medicare HMO | Admitting: Cardiovascular Disease

## 2017-07-25 VITALS — BP 144/82 | HR 74 | Ht 73.0 in | Wt 219.0 lb

## 2017-07-25 DIAGNOSIS — I2510 Atherosclerotic heart disease of native coronary artery without angina pectoris: Secondary | ICD-10-CM

## 2017-07-25 DIAGNOSIS — E785 Hyperlipidemia, unspecified: Secondary | ICD-10-CM | POA: Diagnosis not present

## 2017-07-25 DIAGNOSIS — I10 Essential (primary) hypertension: Secondary | ICD-10-CM | POA: Diagnosis not present

## 2017-07-25 NOTE — Progress Notes (Signed)
Cardiology Office Note   Date:  07/25/2017   ID:  Dale Weckwerth., DOB 07-Jan-1946, MRN [id number]  PCP:  Derwood Kaplan, MD  Cardiologist:   [name], MD   Chief Complaint  Patient presents with  . OTHER    3 month f/u no complaints today. Meds reviewed verbally with pt.      History of Present Illness: Dale Bramble. is a 72 y.o. male who is here today for follow-up visit regarding coronary artery disease status post CABG in July 2018 for severe three-vessel coronary artery disease after presenting with unstable angina.  Echocardiogram showed normal LV systolic function with no significant valvular abnormalities.  Carotid Doppler before surgery showed no significant disease. He has known history of hypertension and hyperlipidemia. 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 During last visit, I switch him from metoprolol to carvedilol. He has been doing extremely well and denies any chest pain, shortness of breath or palpitations.  He does complain of increased fatigue.  Blood pressure has been more controlled than before.  Past Medical History:  Diagnosis Date  . Arthritis    "hands, knees" (01/09/2017)  . CAD (coronary artery disease)    a. 12/2016 Cath: LM nl, LAD 95p, 32m, 60/90d, LCX 80p/m, 145m, OM1 90m, OM2 90, RCA 99p, Ef 55-65%; b. 12/2016 CABG x 4 (LIMA->mLAD, VG->dLAD, VG->OM1->OM2).  . Essential hypertension   . High cholesterol   . History of echocardiogram    a. 12/2016 Echo: EF 65-70%.    Past Surgical History:  Procedure Laterality Date  . CARDIAC CATHETERIZATION  01/09/2017   "scheduling me for OHS"  . COLONOSCOPY WITH PROPOFOL N/A 11/09/2016   Procedure: COLONOSCOPY WITH PROPOFOL;  Surgeon: Earline Mayotte, MD;  Location: [facility] ENDOSCOPY;  Service: Endoscopy;  Laterality: N/A;  . CORONARY ARTERY BYPASS GRAFT N/A 01/12/2017   CABG x 4: LIMA to mLAD, SVG to dLAD, sequential SVG to OM1-OM2, EVH via left thigh and leg;  Surgeon: Purcell Nails, MD;  Location: [facility] OR;  Service: Open Heart Surgery;  Laterality: N/A;  . INTRAOPERATIVE TRANSESOPHAGEAL ECHOCARDIOGRAM N/A 01/12/2017   Procedure: INTRAOPERATIVE TRANSESOPHAGEAL ECHOCARDIOGRAM;  Surgeon: Purcell Nails, MD;  Location: [facility] OR;  Service: Open Heart Surgery;  Laterality: N/A;  . LEFT HEART CATH AND CORONARY ANGIOGRAPHY Left 01/09/2017   Procedure: Left Heart Cath and Coronary Angiography;  Surgeon: Iran Ouch, MD;  Location: [facility] INVASIVE CV LAB;  Service: Cardiovascular;  Laterality: Left;     Current Outpatient Medications  Medication Sig Dispense Refill  . aspirin 325 MG EC tablet Take 1 tablet (325 mg total) by mouth daily. 90 tablet 2  . atorvastatin (LIPITOR) 40 MG tablet Take 1 tablet (40 mg total) by mouth daily. 90 tablet 3  . carvedilol (COREG) 12.5 MG tablet Take 1 tablet (12.5 mg total) by mouth 2 (two) times daily. 180 tablet 3  . cetirizine (ZYRTEC) 10 MG tablet Take 10 mg by mouth daily.    . moexipril (UNIVASC) 7.5 MG tablet Take 7.5 mg by mouth daily.     Marland Kitchen oxyCODONE (OXY IR/ROXICODONE) 5 MG immediate release tablet Take 1 tablet (5 mg total) by mouth every 4 (four) hours as needed for severe pain. 30 tablet 0   No current facility-administered medications for this visit.     Allergies:   Patient has no known allergies.    Social History:  The patient  reports that  has never smoked. he has never  used smokeless tobacco. He reports that he does not drink alcohol or use drugs.   Family History:  The patient's family history includes Heart attack in his mother.    ROS:  Please see the history of present illness.   Otherwise, review of systems are positive for none.   All other systems are reviewed and negative.    PHYSICAL EXAM: VS:  BP (!) 144/82 (BP Location: Left Arm, Patient Position: Sitting, Cuff Size: Large)   Pulse 74   Ht 6\' 1"  (1.854 m)   Wt 219 lb (99.3 kg)   BMI 28.89 kg/m  , BMI Body mass index is 28.89 kg/m. GEN: Well  nourished, well developed, in no acute distress  HEENT: normal  Neck: no JVD,  or masses. Left carotid bruit Cardiac: RRR; no  rubs, or gallops,no edema .  2/6 systolic ejection murmur in the aortic area which is early peaking Respiratory:  clear to auscultation bilaterally, normal work of breathing GI: soft, nontender, nondistended, + BS MS: no deformity or atrophy  Skin: warm and dry, no rash Neuro:  Strength and sensation are intact Psych: euthymic mood, full affect   EKG:  EKG is ordered today. The ekg ordered today demonstrates normal sinus rhythm with possible old inferior infarct   Recent Labs: 01/13/2017: Magnesium 2.7 01/17/2017: BUN 21; Creatinine, Ser 1.31; Hemoglobin 12.0; Platelets 252; Potassium 4.3; Sodium 137 03/17/2017: ALT 15    Lipid Panel    Component Value Date/Time   CHOL 112 03/17/2017 0904   TRIG 70 03/17/2017 0904   HDL 31 (L) 03/17/2017 0904   CHOLHDL 3.6 03/17/2017 0904   VLDL 14 03/17/2017 0904   LDLCALC 67 03/17/2017 0904      Wt Readings from Last 3 Encounters:  07/25/17 219 lb (99.3 kg)  04/20/17 218 lb 8 oz (99.1 kg)  02/20/17 206 lb (93.4 kg)      PAD Screen 01/05/2017  Previous PAD dx? No  Previous surgical procedure? No  Pain with walking? Yes  Subsides with rest? Yes  Feet/toe relief with dangling? No  Painful, non-healing ulcers? No  Extremities discolored? No      ASSESSMENT AND PLAN:  1.   Coronary artery disease involving native coronary arteries without angina: He is doing very well with no anginal symptoms.  Continue medical therapy.  2. Left carotid bruit: Carotid Doppler before CABG showed no significant disease.  3. Hyperlipidemia: Lipid profile improved significantly with atorvastatin with a most recent LDL of 67.  Triglyceride was 70.  4.  Essential hypertension: Blood pressure improved significantly after switching metoprolol to carvedilol but still not at target.  I elected not to make any changes today given  his increased fatigue.  If blood pressure remains elevated upon follow-up, we can consider increasing carvedilol or adding another antihypertensive medication.  Disposition:   FU with me in 6 month  Signed,  Lorine BearsMuhammad Baruc Tugwell, MD  07/25/2017 4:09 PM    [facility]

## 2017-07-25 NOTE — Patient Instructions (Signed)
Medication Instructions: Continue same medications.   Labwork: None.   Procedures/Testing: None.   Follow-Up: 6 months with Dr. Kastiel Simonian.   Any Additional Special Instructions Will Be Listed Below (If Applicable).     If you need a refill on your cardiac medications before your next appointment, please call your pharmacy.   

## 2018-01-15 ENCOUNTER — Encounter: Payer: Self-pay | Admitting: Thoracic Surgery (Cardiothoracic Vascular Surgery)

## 2018-01-15 ENCOUNTER — Ambulatory Visit: Payer: Medicare HMO | Admitting: Thoracic Surgery (Cardiothoracic Vascular Surgery)

## 2018-01-15 VITALS — BP 169/90 | HR 72 | Resp 20 | Ht 73.0 in | Wt 225.0 lb

## 2018-01-15 DIAGNOSIS — Z951 Presence of aortocoronary bypass graft: Secondary | ICD-10-CM | POA: Diagnosis not present

## 2018-01-15 NOTE — Patient Instructions (Addendum)
Continue all previous medications without any changes at this time  Contact Dr Kirke CorinArida to discuss having a stress test if you have significant discomfort in your chest following meals or with exertion  Make every effort to stay physically active, get some type of exercise on a regular basis, and stick to a "heart healthy diet".  The long term benefits for regular exercise and a healthy diet are critically important to your overall health and wellbeing.

## 2018-01-15 NOTE — Progress Notes (Signed)
      [address]       [name] [id number]             [phone]     CARDIOTHORACIC SURGERY OFFICE NOTE  Referring Provider is Iran OuchArida, Muhammad A, MD PCP is Derwood KaplanEason, Ernest B, MD   HPI:  Patient is a 72 year old African-American male with history of hypertension and hyperlipidemia who returns to the office today for routine follow-up status post coronary artery bypass grafting x4 on 01/12/2017 for severe three-vessel coronary artery disease with unstable angina pectoris.  He was last seen in our office on February 20, 2017 at which time he was doing remarkably well.  Since then he has been followed carefully by Dr. Kirke CorinArida who saw him most recently on July 25, 2017.  He returns to our office today and reports that he is doing well.  He is back to normal activity and he exercises on a daily basis.  He states that he does get some discomfort in his chest immediately after eating.  Symptoms are not similar to the angina he had prior to surgery and seem to be partially related by using antacids.  He does not experience exertional chest discomfort.  He has no difficulty swallowing.  Bowel function is regular.  He otherwise feels quite well.   Current Outpatient Medications  Medication Sig Dispense Refill  . aspirin 325 MG EC tablet Take 1 tablet (325 mg total) by mouth daily. 90 tablet 2  . cetirizine (ZYRTEC) 10 MG tablet Take 10 mg by mouth daily.    . moexipril (UNIVASC) 7.5 MG tablet Take 7.5 mg by mouth daily.     Marland Kitchen. atorvastatin (LIPITOR) 40 MG tablet Take 1 tablet (40 mg total) by mouth daily. 90 tablet 3  . carvedilol (COREG) 12.5 MG tablet Take 1 tablet (12.5 mg total) by mouth 2 (two) times daily. 180 tablet 3   No current facility-administered medications for this visit.       Physical Exam:   BP (!) 169/90   Pulse 72   Resp 20   Ht 6\' 1"  (1.854 m)   Wt 225 lb (102.1 kg)   SpO2 95% Comment: RA  BMI 29.69 kg/m   General:  Well-appearing  Chest:   Clear to  auscultation  CV:   Regular rate and rhythm  Incisions:  Completely healed  Abdomen:  Soft nontender  Extremities:  Warm and well-perfused  Diagnostic Tests:  n/a   Impression:  Patient is doing well approximately 1 year following coronary artery bypass surgery.  He does report some postprandial chest discomfort that may be related to acid reflux and are reportedly quite different than the symptoms he had prior to surgery.  He exercises regularly and denies exertional chest discomfort.  Plan:  We have not recommended any change the patient's current medications.  I suggested to the patient that he consider discussing with Dr. Kirke CorinArida whether or not he should undergo a routine nuclear stress test because of his symptoms of postprandial chest discomfort.  In the future he will call and return to see us only should specific problems or questions arise.  All questions answered.  I spent in excess of 15 minutes during the conduct of this office consultation and >50% of this time involved direct face-to-face encounter with the patient for counseling and/or coordination of their care.    Salvatore Decentlarence H. Cornelius Moraswen, MD 01/15/2018 3:26 PM

## 2018-01-16 ENCOUNTER — Telehealth: Payer: Self-pay | Admitting: *Deleted

## 2018-01-16 MED ORDER — PANTOPRAZOLE SODIUM 40 MG PO TBEC: 40.0000 mg | DELAYED_RELEASE_TABLET | Freq: Every day | ORAL | 11 refills | Status: DC

## 2018-01-16 NOTE — Telephone Encounter (Signed)
Call placed to the patient. Per Dr. Kirke CorinArida, Protonix 40 mg tablet has been called in for the patient. An appointment has been made for 8/13 with Dr. Kirke CorinArida. The patient verbalized his understanding.

## 2018-02-06 ENCOUNTER — Ambulatory Visit (INDEPENDENT_AMBULATORY_CARE_PROVIDER_SITE_OTHER): Payer: Medicare HMO | Admitting: Cardiovascular Disease

## 2018-02-06 ENCOUNTER — Encounter: Payer: Self-pay | Admitting: Cardiovascular Disease

## 2018-02-06 VITALS — BP 160/80 | HR 76 | Ht 72.0 in | Wt 225.2 lb

## 2018-02-06 DIAGNOSIS — I10 Essential (primary) hypertension: Secondary | ICD-10-CM | POA: Diagnosis not present

## 2018-02-06 DIAGNOSIS — R0989 Other specified symptoms and signs involving the circulatory and respiratory systems: Secondary | ICD-10-CM | POA: Diagnosis not present

## 2018-02-06 DIAGNOSIS — E785 Hyperlipidemia, unspecified: Secondary | ICD-10-CM | POA: Diagnosis not present

## 2018-02-06 DIAGNOSIS — I2510 Atherosclerotic heart disease of native coronary artery without angina pectoris: Secondary | ICD-10-CM

## 2018-02-06 MED ORDER — CARVEDILOL 25 MG PO TABS: 25.0000 mg | ORAL_TABLET | Freq: Two times a day (BID) | ORAL | 3 refills | Status: DC

## 2018-02-06 NOTE — Progress Notes (Signed)
Cardiology Office Note   Date:  02/06/2018   ID:  Dale Grammestis Wilbert Joneen BoersRankin Jr., DOB 01/18/1946, MRN [id number]  PCP:  Derwood KaplanEason, Ernest B, MD  Cardiologist:   Lorine BearsMuhammad Terena Bohan, MD   Chief Complaint  Patient presents with  . other    unstable angina Medications verbally reviewed.      History of Present Illness: Dale DollarOtis Wilbert Idris Jr. is a 72 y.o. male who is here today for follow-up visit regarding coronary artery disease status post CABG in July 2018 for severe three-vessel coronary artery disease after presenting with unstable angina.  Echocardiogram showed normal LV systolic function with no significant valvular abnormalities.  Carotid Doppler before surgery showed no significant disease. He has known history of hypertension and hyperlipidemia. 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  He was seen by Dr. Cornelius Moraswen recently.  The patient complained of postprandial pain at that time described as increased burning sensation in the epigastric and substernal area.  This was communicated to me and thus I elected to add Protonix 40 mg once daily.  Since then, the patient reports resolution of symptoms.  His blood pressure continues to be elevated.  He takes his medications regularly.  Past Medical History:  Diagnosis Date  . Arthritis    "hands, knees" (01/09/2017)  . CAD (coronary artery disease)    a. 12/2016 Cath: LM nl, LAD 95p, 6266m, 60/90d, LCX 80p/m, 17957m, OM1 5966m, OM2 90, RCA 99p, Ef 55-65%; b. 12/2016 CABG x 4 (LIMA->mLAD, VG->dLAD, VG->OM1->OM2).  . Essential hypertension   . High cholesterol   . History of echocardiogram    a. 12/2016 Echo: EF 65-70%.    Past Surgical History:  Procedure Laterality Date  . CARDIAC CATHETERIZATION  01/09/2017   "scheduling me for OHS"  . COLONOSCOPY WITH PROPOFOL N/A 11/09/2016   Procedure: COLONOSCOPY WITH PROPOFOL;  Surgeon: Earline MayotteByrnett, Jeffrey W, MD;  Location: [facility] ENDOSCOPY;  Service: Endoscopy;  Laterality: N/A;  . CORONARY ARTERY BYPASS GRAFT  N/A 01/12/2017   CABG x 4: LIMA to mLAD, SVG to dLAD, sequential SVG to OM1-OM2, EVH via left thigh and leg;  Surgeon: Purcell Nailswen, Clarence H, MD;  [facility];  Service: Open Heart Surgery;  Laterality: N/A;  . INTRAOPERATIVE TRANSESOPHAGEAL ECHOCARDIOGRAM N/A 01/12/2017   Procedure: INTRAOPERATIVE TRANSESOPHAGEAL ECHOCARDIOGRAM;  Surgeon: Purcell Nailswen, Clarence H, MD;  [facility];  Service: Open Heart Surgery;  Laterality: N/A;  . LEFT HEART CATH AND CORONARY ANGIOGRAPHY Left 01/09/2017   Procedure: Left Heart Cath and Coronary Angiography;  Surgeon: Iran OuchArida, Swati Granberry A, MD;  Location: [facility] INVASIVE CV LAB;  Service: Cardiovascular;  Laterality: Left;     Current Outpatient Medications  Medication Sig Dispense Refill  . aspirin 325 MG EC tablet Take 1 tablet (325 mg total) by mouth daily. 90 tablet 2  . atorvastatin (LIPITOR) 40 MG tablet Take 1 tablet (40 mg total) by mouth daily. 90 tablet 3  . carvedilol (COREG) 12.5 MG tablet Take 1 tablet (12.5 mg total) by mouth 2 (two) times daily. 180 tablet 3  . cetirizine (ZYRTEC) 10 MG tablet Take 10 mg by mouth daily.    . moexipril (UNIVASC) 7.5 MG tablet Take 7.5 mg by mouth daily.     . pantoprazole (PROTONIX) 40 MG tablet Take 1 tablet (40 mg total) by mouth daily. 30 tablet 11   No current facility-administered medications for this visit.     Allergies:   Patient has no known allergies.    Social History:  The patient  reports that he has never smoked. He has never used smokeless tobacco. He reports that he does not drink alcohol or use drugs.   Family History:  The patient's family history includes Heart attack in his mother.    ROS:  Please see the history of present illness.   Otherwise, review of systems are positive for none.   All other systems are reviewed and negative.    PHYSICAL EXAM: VS:  BP (!) 160/80 (BP Location: Left Arm, Patient Position: Sitting, Cuff Size: Large)   Pulse 76   Ht 6' (1.829 m)   Wt 225 lb 4 oz (102.2 kg)    BMI 30.55 kg/m  , BMI Body mass index is 30.55 kg/m. GEN: Well nourished, well developed, in no acute distress  HEENT: normal  Neck: no JVD,  or masses. Left carotid bruit Cardiac: RRR; no  rubs, or gallops,no edema .  2/6 systolic ejection murmur in the aortic area which is early peaking Respiratory:  clear to auscultation bilaterally, normal work of breathing GI: soft, nontender, nondistended, + BS MS: no deformity or atrophy  Skin: warm and dry, no rash Neuro:  Strength and sensation are intact Psych: euthymic mood, full affect   EKG:  EKG is ordered today. The ekg ordered today demonstrates normal sinus rhythm with possible left atrial enlargement, LVH and old inferior infarct.  No new changes.   Recent Labs: 03/17/2017: ALT 15    Lipid Panel    Component Value Date/Time   CHOL 112 03/17/2017 0904   TRIG 70 03/17/2017 0904   HDL 31 (L) 03/17/2017 0904   CHOLHDL 3.6 03/17/2017 0904   VLDL 14 03/17/2017 0904   LDLCALC 67 03/17/2017 0904      Wt Readings from Last 3 Encounters:  02/06/18 225 lb 4 oz (102.2 kg)  01/15/18 225 lb (102.1 kg)  07/25/17 219 lb (99.3 kg)      PAD Screen 01/05/2017  Previous PAD dx? No  Previous surgical procedure? No  Pain with walking? Yes  Subsides with rest? Yes  Feet/toe relief with dangling? No  Painful, non-healing ulcers? No  Extremities discolored? No      ASSESSMENT AND PLAN:  1.   Coronary artery disease involving native coronary arteries without angina: Recent postprandial epigastric and chest pain with burning sensation likely was due to GERD.  His symptoms resolved after starting Protonix.  Continue to monitor for now.  2. Left carotid bruit: Carotid Doppler before CABG showed no significant disease.  3. Hyperlipidemia: Lipid profile improved significantly with atorvastatin with a most recent LDL of 67.  Triglyceride was 70.  4.  Essential hypertension: Blood pressure continues to be elevated and not controlled.  I  elected to increase carvedilol to 25 mg twice daily.  If blood pressure remains elevated, can consider increasing Moexipril or adding amlodipine.  Disposition:   FU with me in 6 month  Signed,  Lorine BearsMuhammad Hassell Patras, MD  02/06/2018 4:25 PM    [facility]

## 2018-02-06 NOTE — Patient Instructions (Signed)
Medication Instructions: INCREASE the Carvedilol to 25 mg twice daily  If you need a refill on your cardiac medications before your next appointment, please call your pharmacy.   Follow-Up: Your physician wants you to follow-up in 6 months with Dr. Kirke CorinArida. You will receive a reminder letter in the mail two months in advance. If you don't receive a letter, please call our office at [phone] to schedule this follow-up appointment.   Thank you for choosing [facility]!

## 2018-02-07 ENCOUNTER — Telehealth: Payer: Self-pay | Admitting: Cardiovascular Disease

## 2018-02-07 MED ORDER — CARVEDILOL 25 MG PO TABS: 25.0000 mg | ORAL_TABLET | Freq: Two times a day (BID) | ORAL | 3 refills | Status: DC

## 2018-02-07 NOTE — Telephone Encounter (Signed)
Pt is [facility]: [facility] [id number] [place], [place] [id number] [phone] phone

## 2018-02-07 NOTE — Telephone Encounter (Signed)
[facility] been [facility] and Carvedilol 25 mg bid and been sent in for the patient.

## 2018-02-07 NOTE — Telephone Encounter (Signed)
Left a message to call back to [facility] address.

## 2018-02-20 ENCOUNTER — Other Ambulatory Visit: Payer: Self-pay

## 2018-02-20 MED ORDER — PANTOPRAZOLE SODIUM 40 MG PO TBEC: 40.0000 mg | DELAYED_RELEASE_TABLET | Freq: Every day | ORAL | 11 refills | Status: DC

## 2018-05-23 ENCOUNTER — Encounter: Payer: Self-pay | Admitting: Emergency Medicine

## 2018-05-23 ENCOUNTER — Emergency Department
Admission: EM | Admit: 2018-05-23 | Discharge: 2018-05-23 | Disposition: A | Discharge status: Home / Self Care | Payer: Medicare HMO | Attending: Student in an Organized Health Care Education/Training Program | Admitting: Student in an Organized Health Care Education/Training Program

## 2018-05-23 ENCOUNTER — Other Ambulatory Visit: Payer: Self-pay

## 2018-05-23 DIAGNOSIS — J0100 Acute maxillary sinusitis, unspecified: Secondary | ICD-10-CM | POA: Diagnosis not present

## 2018-05-23 DIAGNOSIS — R0981 Nasal congestion: Secondary | ICD-10-CM | POA: Diagnosis present

## 2018-05-23 DIAGNOSIS — Z951 Presence of aortocoronary bypass graft: Secondary | ICD-10-CM | POA: Insufficient documentation

## 2018-05-23 DIAGNOSIS — I10 Essential (primary) hypertension: Secondary | ICD-10-CM | POA: Insufficient documentation

## 2018-05-23 DIAGNOSIS — I25110 Atherosclerotic heart disease of native coronary artery with unstable angina pectoris: Secondary | ICD-10-CM | POA: Insufficient documentation

## 2018-05-23 DIAGNOSIS — Z7982 Long term (current) use of aspirin: Secondary | ICD-10-CM | POA: Insufficient documentation

## 2018-05-23 MED ORDER — FEXOFENADINE-PSEUDOEPHED ER 60-120 MG PO TB12: 1.0000 | ORAL_TABLET | Freq: Two times a day (BID) | ORAL | 0 refills | Status: DC

## 2018-05-23 MED ORDER — AMOXICILLIN 875 MG PO TABS: 875.0000 mg | ORAL_TABLET | Freq: Two times a day (BID) | ORAL | 0 refills | Status: DC

## 2018-05-23 NOTE — ED Triage Notes (Signed)
Nasal congestion for one plus week, no SHOB, appears in NAD.

## 2018-05-23 NOTE — ED Provider Notes (Signed)
[facility] Emergency Department Provider Note   ____________________________________________   First MD Initiated Contact with Patient 05/23/18 [phone]     (approximate)  [facility].   HISTORY  Chief Complaint Nasal Congestion    HPI Dale DollarOtis Wilbert Springfield Jr. is a 72 y.o. male patient complaining 1 week of nasal congestion and postnasal drainage.  Patient is complaining of frontal headache.  Patient denies fever/chills.  Patient denies nausea, vomiting, diarrhea.  Patient states intermittent cough secondary postnasal drainage.  Patient denies chest abdominal pain.  No palliative measures for complaint.  Past Medical History:  Diagnosis Date  . Arthritis    "hands, knees" (01/09/2017)  . CAD (coronary artery disease)    a. 12/2016 Cath: LM nl, LAD 95p, 5771m, 60/90d, LCX 80p/m, 12957m, OM1 6971m, OM2 90, RCA 99p, Ef 55-65%; b. 12/2016 CABG x 4 (LIMA->mLAD, VG->dLAD, VG->OM1->OM2).  . Essential hypertension   . High cholesterol   . History of echocardiogram    a. 12/2016 Echo: EF 65-70%.    Patient Active Problem List   Diagnosis Date Noted  . Hyperlipidemia with target low density lipoprotein (LDL) cholesterol less than 70 mg/dL 96/04/540907/23/2018  . 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 Coronary artery disease involving native coronary artery with angina pectoris (HCC) 01/09/2017  . Unstable angina (HCC)   . Essential hypertension   . Encounter for screening colonoscopy 09/21/2016    Past Surgical History:  Procedure Laterality Date  . CARDIAC CATHETERIZATION  01/09/2017   "scheduling me for OHS"  . COLONOSCOPY WITH PROPOFOL N/A 11/09/2016   Procedure: COLONOSCOPY WITH PROPOFOL;  Surgeon: Earline MayotteByrnett, Jeffrey W, MD;  Location: [facility] ENDOSCOPY;  Service: Endoscopy;  Laterality: N/A;  . CORONARY ARTERY BYPASS GRAFT N/A 01/12/2017   CABG x 4: LIMA to mLAD, SVG to dLAD, sequential SVG to OM1-OM2, EVH via left thigh and leg;  Surgeon:  Purcell Nailswen, Clarence H, MD;  [facility];  Service: Open Heart Surgery;  Laterality: N/A;  . INTRAOPERATIVE TRANSESOPHAGEAL ECHOCARDIOGRAM N/A 01/12/2017   Procedure: INTRAOPERATIVE TRANSESOPHAGEAL ECHOCARDIOGRAM;  Surgeon: Purcell Nailswen, Clarence H, MD;  [facility];  Service: Open Heart Surgery;  Laterality: N/A;  . LEFT HEART CATH AND CORONARY ANGIOGRAPHY Left 01/09/2017   Procedure: Left Heart Cath and Coronary Angiography;  Surgeon: Iran OuchArida, Muhammad A, MD;  Location: [facility] INVASIVE CV LAB;  Service: Cardiovascular;  Laterality: Left;    Prior to Admission medications   Medication Sig Start Date End Date Taking? Authorizing Provider  amoxicillin (AMOXIL) 875 MG tablet Take 1 tablet (875 mg total) by mouth 2 (two) times daily. 05/23/18   Joni ReiningSmith, Verda Mehta K, PA-C  aspirin 325 MG EC tablet Take 1 tablet (325 mg total) by mouth daily. 02/28/17   Antonieta IbaGollan, Timothy J, MD  atorvastatin (LIPITOR) 40 MG tablet Take 1 tablet (40 mg total) by mouth daily. 01/05/17 07/25/17  Iran OuchArida, Muhammad A, MD  carvedilol (COREG) 25 MG tablet Take 1 tablet (25 mg total) by mouth 2 (two) times daily. 02/07/18 05/08/18  Iran OuchArida, Muhammad A, MD  cetirizine (ZYRTEC) 10 MG tablet Take 10 mg by mouth daily.    [provider]  fexofenadine-pseudoephedrine (ALLEGRA-D) 60-120 MG 12 hr tablet Take 1 tablet by mouth 2 (two) times daily. 05/23/18   Joni ReiningSmith, Salli Bodin K, PA-C  moexipril (UNIVASC) 7.5 MG tablet Take 7.5 mg by mouth daily.     [provider]  pantoprazole (PROTONIX) 40 MG tablet Take 1 tablet (40 mg  total) by mouth daily. 02/20/18   Iran Ouch, MD    Allergies Patient has no known allergies.  Family History  Problem Relation Age of Onset  . Heart attack Mother     Social History Social History   Tobacco Use  . Smoking status: Never Smoker  . Smokeless tobacco: Never Used  Substance Use Topics  . Alcohol use: No  . Drug use: No    Review of Systems  Constitutional: No fever/chills Eyes: No visual  changes. ENT: No sore throat. Cardiovascular: Denies chest pain. Respiratory: Denies shortness of breath. Gastrointestinal: No abdominal pain.  No nausea, no vomiting.  No diarrhea.  No constipation. Genitourinary: Negative for dysuria. Musculoskeletal: Negative for back pain. Skin: Negative for rash. Neurological: Negative for headaches, focal weakness or numbness. Endocrine:Hyperlipidemia and hypertension ____________________________________________   PHYSICAL EXAM:  VITAL SIGNS: ED Triage Vitals  Enc Vitals Group     BP 05/23/18 0725 114/64     Pulse Rate 05/23/18 0725 91     Resp 05/23/18 0725 18     Temp 05/23/18 0725 99 F (37.2 C)     Temp Source 05/23/18 0725 Oral     SpO2 05/23/18 0725 95 %     Weight 05/23/18 0723 225 lb (102.1 kg)     Height 05/23/18 0723 6' (1.829 m)     Head Circumference --      Peak Flow --      Pain Score 05/23/18 0723 0     Pain Loc --      Pain Edu? --      Excl. in GC? --     Constitutional: Alert and oriented. Well appearing and in no acute distress. Nose: No bilateral maxillary guarding with tenderness nasal turbinates. Mouth/Throat: Mucous membranes are moist.  Oropharynx non-erythematous.  Postnasal drainage. Neck: No stridor.  Hematological/Lymphatic/Immunilogical: No cervical lymphadenopathy. Cardiovascular: Normal rate, regular rhythm. Grossly normal heart sounds.  Good peripheral circulation. Respiratory: Normal respiratory effort.  No retractions. Lungs CTAB. Neurologic:  Normal speech and language. No gross focal neurologic deficits are appreciated. No gait instability. Skin:  Skin is warm, dry and intact. No rash noted. Psychiatric: Mood and affect are normal. Speech and behavior are normal.  ____________________________________________   LABS (all labs ordered are listed, but only abnormal results are displayed)  Labs Reviewed - No data to  display ____________________________________________  EKG   ____________________________________________  RADIOLOGY  ED MD interpretation:    Official radiology report(s): No results found.  ____________________________________________   PROCEDURES  Procedure(s) performed: None  Procedures  Critical Care performed: No  ____________________________________________   INITIAL IMPRESSION / ASSESSMENT AND PLAN / ED COURSE  As part of my medical decision making, I reviewed the following data within the electronic MEDICAL RECORD NUMBER    Nasal congestion facial pain secondary sinusitis.  Patient given discharge care instruction advised take medication as directed.  Patient possible PCP in 1 week if no improvement.  Return to ED if condition worsens.      ____________________________________________   FINAL CLINICAL IMPRESSION(S) / ED DIAGNOSES  Final diagnoses:  Acute maxillary sinusitis, recurrence not specified     ED Discharge Orders         Ordered    amoxicillin (AMOXIL) 875 MG tablet  2 times daily     05/23/18 0732    fexofenadine-pseudoephedrine (ALLEGRA-D) 60-120 MG 12 hr tablet  2 times daily     05/23/18 0732  Note:  This document was prepared using Dragon voice recognition software and may include unintentional dictation errors.    Joni Reining, PA-C 05/23/18 [id number]    Willy Eddy, MD 05/23/18 [phone]

## 2018-05-23 NOTE — ED Notes (Signed)
See triage note  Presents with nasal congestion and sinus pressure  States sx's started about 1 week ago  Subjective fever yesterday  Afebrile on arrival

## 2018-10-16 ENCOUNTER — Telehealth: Payer: Self-pay | Admitting: Cardiovascular Disease

## 2018-10-16 NOTE — Telephone Encounter (Signed)
Virtual Visit Pre-Appointment Phone Call  "(Name), I am calling you today to discuss your upcoming appointment. We are currently trying to limit exposure to the virus that causes COVID-19 by seeing patients at home rather than in the office."  1. "What is the BEST phone number to call the day of the visit?" - include this in appointment notes  2. Do you have or have access to (through a family member/friend) a smartphone with video capability that we can use for your visit?" a. If yes - list this number in appt notes as cell (if different from BEST phone #) and list the appointment type as a VIDEO visit in appointment notes b. If no - list the appointment type as a PHONE visit in appointment notes  3. Confirm consent - "In the setting of the current Covid19 crisis, you are scheduled for a (phone or video) visit with your provider on (date) at (time).  Just as we do with many in-office visits, in order for you to participate in this visit, we must obtain consent.  If you'd like, I can send this to your mychart (if signed up) or email for you to review.  Otherwise, I can obtain your verbal consent now.  All virtual visits are billed to your insurance company just like a normal visit would be.  By agreeing to a virtual visit, we'd like you to understand that the technology does not allow for your provider to perform an examination, and thus may limit your provider's ability to fully assess your condition. If your provider identifies any concerns that need to be evaluated in person, we will make arrangements to do so.  Finally, though the technology is pretty good, we cannot assure that it will always work on either your or our end, and in the setting of a video visit, we may have to convert it to a phone-only visit.  In either situation, we cannot ensure that we have a secure connection.  Are you willing to proceed?" STAFF: Did the patient verbally acknowledge consent to telehealth visit? Document  YES/NO here: yes  4. Advise patient to be prepared - "Two hours prior to your appointment, go ahead and check your blood pressure, pulse, oxygen saturation, and your weight (if you have the equipment to check those) and write them all down. When your visit starts, your provider will ask you for this information. If you have an Apple Watch or Kardia device, please plan to have heart rate information ready on the day of your appointment. Please have a pen and paper handy nearby the day of the visit as well."  5. Give patient instructions for MyChart download to smartphone OR Doximity/Doxy.me as below if video visit (depending on what platform provider is using)  6. Inform patient they will receive a phone call 15 minutes prior to their appointment time (may be from unknown caller ID) so they should be prepared to answer    TELEPHONE CALL NOTE  Rakeen Ginger. has been deemed a candidate for a follow-up tele-health visit to limit community exposure during the Covid-19 pandemic. I spoke with the patient via phone to ensure availability of phone/video source, confirm preferred email & phone number, and discuss instructions and expectations.  I reminded Jahid Apley. to be prepared with any vital sign and/or heart rhythm information that could potentially be obtained via home monitoring, at the time of his visit. I reminded Dale Cook. to expect a phone  call prior to his visit.  Dale MaxcyJennifer B Harkins 10/16/2018 3:23 PM   INSTRUCTIONS FOR DOWNLOADING THE MYCHART APP TO SMARTPHONE  - The patient must first make sure to have activated MyChart and know their login information - If Apple, go to [employer] Store and type in MyChart in the search bar and download the app. If Android, ask patient to go to [employer] Play Store and type in [place] in the search bar and download the app. The app is free but as with any other app downloads, their phone may require them to verify saved payment  information or Apple/Android password.  - The patient will need to then log into the app with their MyChart username and password, and select [facility] as their healthcare provider to link the account. When it is time for your visit, go to the MyChart app, find appointments, and click Begin Video Visit. Be sure to Select Allow for your device to access the Microphone and Camera for your visit. You will then be connected, and your provider will be with you shortly.  **If they have any issues connecting, or need assistance please contact MyChart service desk [phone])**  **If using a computer, in order to ensure the best quality for their visit they will need to use either of the following Internet Browsers: [employer] Edge, or Google Chrome**  IF USING DOXIMITY or DOXY.ME - The patient will receive a link just prior to their visit by text.     FULL LENGTH CONSENT FOR TELE-HEALTH VISIT   I hereby voluntarily request, consent and authorize [facility] and its employed or contracted physicians, physician assistants, nurse practitioners or other licensed health care professionals (the Practitioner), to provide me with telemedicine health care services (the Services") as deemed necessary by the treating Practitioner. I acknowledge and consent to receive the Services by the Practitioner via telemedicine. I understand that the telemedicine visit will involve communicating with the Practitioner through live audiovisual communication technology and the disclosure of certain medical information by electronic transmission. I acknowledge that I have been given the opportunity to request an in-person assessment or other available alternative prior to the telemedicine visit and am voluntarily participating in the telemedicine visit.  I understand that I have the right to withhold or withdraw my consent to the use of telemedicine in the course of my care at any time, without affecting my right  to future care or treatment, and that the Practitioner or I may terminate the telemedicine visit at any time. I understand that I have the right to inspect all information obtained and/or recorded in the course of the telemedicine visit and may receive copies of available information for a reasonable fee.  I understand that some of the potential risks of receiving the Services via telemedicine include:   Delay or interruption in medical evaluation due to technological equipment failure or disruption;  Information transmitted may not be sufficient (e.g. poor resolution of images) to allow for appropriate medical decision making by the Practitioner; and/or   In rare instances, security protocols could fail, causing a breach of personal health information.  Furthermore, I acknowledge that it is my responsibility to provide information about my medical history, conditions and care that is complete and accurate to the best of my ability. I acknowledge that Practitioner's advice, recommendations, and/or decision may be based on factors not within their control, such as incomplete or inaccurate data provided by me or distortions of diagnostic images or specimens that may result from  electronic transmissions. I understand that the practice of medicine is not an exact science and that Practitioner makes no warranties or guarantees regarding treatment outcomes. I acknowledge that I will receive a copy of this consent concurrently upon execution via email to the email address I last provided but may also request a printed copy by calling the office of [place].    I understand that my insurance will be billed for this visit.   I have read or had this consent read to me.  I understand the contents of this consent, which adequately explains the benefits and risks of the Services being provided via telemedicine.   I have been provided ample opportunity to ask questions regarding this consent and the Services  and have had my questions answered to my satisfaction.  I give my informed consent for the services to be provided through the use of telemedicine in my medical care  By participating in this telemedicine visit I agree to the above.

## 2018-10-30 ENCOUNTER — Telehealth (INDEPENDENT_AMBULATORY_CARE_PROVIDER_SITE_OTHER): Payer: Medicare HMO | Admitting: Cardiovascular Disease

## 2018-10-30 ENCOUNTER — Other Ambulatory Visit: Payer: Self-pay

## 2018-10-30 ENCOUNTER — Encounter: Payer: Self-pay | Admitting: Cardiovascular Disease

## 2018-10-30 VITALS — Ht 72.0 in | Wt 220.0 lb

## 2018-10-30 DIAGNOSIS — I2510 Atherosclerotic heart disease of native coronary artery without angina pectoris: Secondary | ICD-10-CM

## 2018-10-30 NOTE — Progress Notes (Signed)
Virtual Visit via Telephone Note   This visit type was conducted due to national recommendations for restrictions regarding the COVID-19 Pandemic (e.g. social distancing) in an effort to limit this patient's exposure and mitigate transmission in our community.  Due to his co-morbid illnesses, this patient is at least at moderate risk for complications without adequate follow up.  This format is felt to be most appropriate for this patient at this time.  The patient did not have access to video technology/had technical difficulties with video requiring transitioning to audio format only (telephone).  All issues noted in this document were discussed and addressed.  No physical exam could be performed with this format.  Please refer to the patient's chart for his  consent to [facility]health [facility].   Date:  10/30/2018   ID:  Dale Dollartis Wilbert Ra Jr., DOB 05/08/1946, MRN [id number]  Patient Location: Home Provider Location: Office  PCP:  Jaclyn Shaggyate, Denny C, MD  Cardiologist:  Lorine BearsMuhammad Alic Hilburn, MD  Electrophysiologist:  None   Evaluation Performed:  Follow-Up Visit  Chief Complaint:   Doing well with no complaints  History of Present Illness:    Dale DollarOtis Wilbert Eiben Jr. is a 73 y.o. male was reached via phone for a follow-up visit. He has known history of coronary artery disease status post CABG in July 2018 for severe three-vessel coronary artery disease after presenting with unstable angina.  Echocardiogram showed normal LV systolic function with no significant valvular abnormalities.  Carotid Doppler before surgery showed no significant disease. He has known history of hypertension and hyperlipidemia. 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  He had symptoms of GERD last year that resolved with Protonix.  I also increase his carvedilol last year due to elevated blood pressure.  He has been doing well with no chest pain, shortness of breath or palpitations.  He takes his medications  regularly The patient does not have symptoms concerning for COVID-19 infection (fever, chills, cough, or new shortness of breath).    Past Medical History:  Diagnosis Date  . Arthritis    "hands, knees" (01/09/2017)  . CAD (coronary artery disease)    a. 12/2016 Cath: LM nl, LAD 95p, 2343m, 60/90d, LCX 80p/m, 17110m, OM1 2943m, OM2 90, RCA 99p, Ef 55-65%; b. 12/2016 CABG x 4 (LIMA->mLAD, VG->dLAD, VG->OM1->OM2).  . Essential hypertension   . High cholesterol   . History of echocardiogram    a. 12/2016 Echo: EF 65-70%.   Past Surgical History:  Procedure Laterality Date  . CARDIAC CATHETERIZATION  01/09/2017   "scheduling me for OHS"  . COLONOSCOPY WITH PROPOFOL N/A 11/09/2016   Procedure: COLONOSCOPY WITH PROPOFOL;  Surgeon: Earline MayotteByrnett, Jeffrey W, MD;  Location: [facility] ENDOSCOPY;  Service: Endoscopy;  Laterality: N/A;  . CORONARY ARTERY BYPASS GRAFT N/A 01/12/2017   CABG x 4: LIMA to mLAD, SVG to dLAD, sequential SVG to OM1-OM2, EVH via left thigh and leg;  Surgeon: Purcell Nailswen, Clarence H, MD;  [facility];  Service: Open Heart Surgery;  Laterality: N/A;  . INTRAOPERATIVE TRANSESOPHAGEAL ECHOCARDIOGRAM N/A 01/12/2017   Procedure: INTRAOPERATIVE TRANSESOPHAGEAL ECHOCARDIOGRAM;  Surgeon: Purcell Nailswen, Clarence H, MD;  [facility];  Service: Open Heart Surgery;  Laterality: N/A;  . LEFT HEART CATH AND CORONARY ANGIOGRAPHY Left 01/09/2017   Procedure: Left Heart Cath and Coronary Angiography;  Surgeon: Iran OuchArida, Lear Carstens A, MD;  Location: [facility] INVASIVE CV LAB;  Service: Cardiovascular;  Laterality: Left;     Current Meds  Medication Sig  . aspirin 325 MG EC  tablet Take 1 tablet (325 mg total) by mouth daily.  Marland Kitchen atorvastatin (LIPITOR) 40 MG tablet Take 1 tablet (40 mg total) by mouth daily.  . carvedilol (COREG) 25 MG tablet Take 1 tablet (25 mg total) by mouth 2 (two) times daily.  . cetirizine (ZYRTEC) 10 MG tablet Take 10 mg by mouth daily.  . fexofenadine-pseudoephedrine (ALLEGRA-D) 60-120 MG 12 hr tablet Take  1 tablet by mouth 2 (two) times daily.  . meloxicam (MOBIC) 15 MG tablet Take 15 mg by mouth daily.  . moexipril (UNIVASC) 15 MG tablet Take 15 mg by mouth at bedtime.   . pantoprazole (PROTONIX) 40 MG tablet Take 1 tablet (40 mg total) by mouth daily.  . pantoprazole (PROTONIX) 40 MG tablet pantoprazole 40 mg tablet,delayed release  TAKE 1 TABLET BY MOUTH EVERY DAY     Allergies:   Other   Social History   Tobacco Use  . Smoking status: Never Smoker  . Smokeless tobacco: Never Used  Substance Use Topics  . Alcohol use: No  . Drug use: No     Family Hx: The patient's family history includes Heart attack in his mother.  ROS:   Please see the history of present illness.     All other systems reviewed and are negative.   Prior CV studies:   The following studies were reviewed today:    Labs/Other Tests and Data Reviewed:    EKG:  No ECG reviewed.  Recent Labs: No results found for requested labs within last 8760 hours.   Recent Lipid Panel Lab Results  Component Value Date/Time   CHOL 112 03/17/2017 09:04 AM   TRIG 70 03/17/2017 09:04 AM   HDL 31 (L) 03/17/2017 09:04 AM   CHOLHDL 3.6 03/17/2017 09:04 AM   LDLCALC 67 03/17/2017 09:04 AM    Wt Readings from Last 3 Encounters:  10/30/18 220 lb (99.8 kg)  05/23/18 225 lb (102.1 kg)  02/06/18 225 lb 4 oz (102.2 kg)     Objective:    Vital Signs:  Ht 6' (1.829 m)   Wt 220 lb (99.8 kg)   BMI 29.84 kg/m    VITAL SIGNS:  reviewed  ASSESSMENT & PLAN:    1.   Coronary artery disease involving native coronary arteries without angina: He is doing very well with no anginal symptoms.  Continue medical therapy  2. Left carotid bruit: Carotid Doppler before CABG showed no significant disease.  3. Hyperlipidemia: Continue treatment with atorvastatin.  Most recent LDL was 67  4.  Essential hypertension: Blood pressure improved after increasing carvedilol to 25 mg twice daily.  He reports that his blood pressure  recently with his primary care physician was mildly elevated.  We can consider adding amlodipine in the near future if needed.   COVID-19 Education: The signs and symptoms of COVID-19 were discussed with the patient and how to seek care for testing (follow up with PCP or arrange E-visit).  The importance of social distancing was discussed today.  Time:   Today, I have spent 15 minutes with the patient with telehealth technology discussing the above problems.     Medication Adjustments/Labs and Tests Ordered: Current medicines are reviewed at length with the patient today.  Concerns regarding medicines are outlined above.   Tests Ordered: No orders of the defined types were placed in this encounter.   Medication Changes: No orders of the defined types were placed in this encounter.   Disposition:  Follow up in 6 month(s)  Signed, [name], MD  10/30/2018 2:12 PM    [facility]

## 2018-10-30 NOTE — Patient Instructions (Signed)
Medication Instructions:  Continue same medications If you need a refill on your cardiac medications before your next appointment, please call your pharmacy.   Lab work: None  Testing/Procedures: None  Follow-Up: At [facility], you and your health needs are our priority.  As part of our continuing mission to provide you with exceptional heart care, we have created designated Provider Care Teams.  These Care Teams include your primary Cardiologist (physician) and Advanced Practice Providers (APPs -  Physician Assistants and Nurse Practitioners) who all work together to provide you with the care you need, when you need it. You will need a follow up appointment in 6 months.  Please call our office 2 months in advance to schedule this appointment.  You may see [name], MD or one of the following Advanced Practice Providers on your designated Care Team:   Nicolasa Ducking, NP Eula Listen, PA-C . Marisue Ivan, PA-C

## 2018-12-06 ENCOUNTER — Other Ambulatory Visit: Payer: Self-pay

## 2018-12-06 ENCOUNTER — Telehealth: Payer: Self-pay | Admitting: Cardiovascular Disease

## 2018-12-06 DIAGNOSIS — M170 Bilateral primary osteoarthritis of knee: Secondary | ICD-10-CM | POA: Insufficient documentation

## 2018-12-06 DIAGNOSIS — I10 Essential (primary) hypertension: Secondary | ICD-10-CM | POA: Insufficient documentation

## 2018-12-06 DIAGNOSIS — R0789 Other chest pain: Secondary | ICD-10-CM | POA: Diagnosis not present

## 2018-12-06 DIAGNOSIS — M19042 Primary osteoarthritis, left hand: Secondary | ICD-10-CM | POA: Diagnosis not present

## 2018-12-06 DIAGNOSIS — Z791 Long term (current) use of non-steroidal anti-inflammatories (NSAID): Secondary | ICD-10-CM | POA: Diagnosis not present

## 2018-12-06 DIAGNOSIS — E785 Hyperlipidemia, unspecified: Secondary | ICD-10-CM | POA: Insufficient documentation

## 2018-12-06 DIAGNOSIS — M19041 Primary osteoarthritis, right hand: Secondary | ICD-10-CM | POA: Insufficient documentation

## 2018-12-06 DIAGNOSIS — I200 Unstable angina: Secondary | ICD-10-CM | POA: Diagnosis present

## 2018-12-06 DIAGNOSIS — Z1159 Encounter for screening for other viral diseases: Secondary | ICD-10-CM | POA: Diagnosis not present

## 2018-12-06 DIAGNOSIS — K219 Gastro-esophageal reflux disease without esophagitis: Secondary | ICD-10-CM | POA: Insufficient documentation

## 2018-12-06 DIAGNOSIS — R079 Chest pain, unspecified: Secondary | ICD-10-CM | POA: Diagnosis present

## 2018-12-06 DIAGNOSIS — Z79899 Other long term (current) drug therapy: Secondary | ICD-10-CM | POA: Diagnosis not present

## 2018-12-06 DIAGNOSIS — Z951 Presence of aortocoronary bypass graft: Secondary | ICD-10-CM | POA: Diagnosis not present

## 2018-12-06 DIAGNOSIS — Z7982 Long term (current) use of aspirin: Secondary | ICD-10-CM | POA: Diagnosis not present

## 2018-12-06 DIAGNOSIS — Z8249 Family history of ischemic heart disease and other diseases of the circulatory system: Secondary | ICD-10-CM | POA: Diagnosis not present

## 2018-12-06 DIAGNOSIS — I25110 Atherosclerotic heart disease of native coronary artery with unstable angina pectoris: Secondary | ICD-10-CM | POA: Diagnosis not present

## 2018-12-06 LAB — CBC
HCT: 45.2 % (ref 39.0–52.0)
Hemoglobin: 14.7 g/dL (ref 13.0–17.0)
MCH: 27.3 pg (ref 26.0–34.0)
MCHC: 32.5 g/dL (ref 30.0–36.0)
MCV: 84 fL (ref 80.0–100.0)
Platelets: 150 10*3/uL (ref 150–400)
RBC: 5.38 MIL/uL (ref 4.22–5.81)
RDW: 14.6 % (ref 11.5–15.5)
WBC: 8.3 10*3/uL (ref 4.0–10.5)
nRBC: 0 % (ref 0.0–0.2)

## 2018-12-06 LAB — BASIC METABOLIC PANEL
Anion gap: 6 (ref 5–15)
BUN: 16 mg/dL (ref 8–23)
CO2: 23 mmol/L (ref 22–32)
Calcium: 8.5 mg/dL — ABNORMAL LOW (ref 8.9–10.3)
Chloride: 109 mmol/L (ref 98–111)
Creatinine, Ser: 1.24 mg/dL (ref 0.61–1.24)
GFR calc Af Amer: >60 mL/min (ref >60)
GFR calc non Af Amer: 59 mL/min — ABNORMAL LOW (ref >60)
Glucose, Bld: 130 mg/dL — ABNORMAL HIGH (ref 70–99)
Potassium: 3.5 mmol/L (ref 3.5–5.1)
Sodium: 138 mmol/L (ref 135–145)

## 2018-12-06 LAB — TROPONIN I: Troponin I: <0.03 ng/mL (ref <0.03)

## 2018-12-06 MED ORDER — SODIUM CHLORIDE 0.9% FLUSH
3.0000 mL | Freq: Once | INTRAVENOUS | Status: AC
  Administered 2018-12-07: 3 mL via INTRAVENOUS

## 2018-12-06 NOTE — Telephone Encounter (Signed)
We will need to get him for an office visit with an APP today or tomorrow.  It sounds like he is going to need a cardiac cath and I can do that on Monday. The other alternative would be for him to go to the emergency department for more urgent evaluation if he is having active chest pain.

## 2018-12-06 NOTE — Telephone Encounter (Signed)
Pt c/o of Chest Pain: STAT if CP now or developed within 24 hours  1. Are you having CP right now? No   2. Are you experiencing any other symptoms (ex. SOB, nausea, vomiting, sweating)? Sob sweating at times   3. How long have you been experiencing CP? 2 days   4. Is your CP continuous or coming and going? Comes and goes with exertion   5. Have you taken Nitroglycerin? No  ?

## 2018-12-06 NOTE — Telephone Encounter (Signed)
Patient complains of chest pain (center of chest) that occurs whenever he walks a distance (such as across a parking lot).  It is accompanied by shortness of breath and sweating (sometimes). Pain is "8" out of 10. It resolves after a few minutes of sitting down. He can walk and do things in his house and he does not have the chest pain. Over the past 2 days, every time he does anything strenuous or walks a distance it happens. He just bought a BP cuff, 201/103, HR 70. He is with his girlfriend in [place], [place] right now.   While on the phone with me, he is not having chest pain. States anytime he walks a distance it recurs. Routing to Dr Fletcher Anon for advice.  Pt verbalized understanding to call 911 or go to the emergency room, if he develops any new or worsening symptoms.

## 2018-12-06 NOTE — ED Triage Notes (Signed)
Patient c/o medial chest pain and SOB. Patient reports pain worsens with exertion. Patient reports hx of triple bypass.

## 2018-12-06 NOTE — Telephone Encounter (Signed)
Spoke with patient. He says he has not had any chest pain since activity this morning. He is concerned and stated he would rather go on to the Emergency room to get checked out. I stressed the importance of this and he verbalized understanding.

## 2018-12-07 ENCOUNTER — Encounter: Payer: Self-pay | Admitting: *Deleted

## 2018-12-07 ENCOUNTER — Observation Stay
Admission: EM | Admit: 2018-12-07 | Discharge: 2018-12-07 | Disposition: A | Discharge status: Home / Self Care | Payer: Medicare HMO | Attending: Internal Medicine | Admitting: Internal Medicine

## 2018-12-07 ENCOUNTER — Observation Stay (HOSPITAL_BASED_OUTPATIENT_CLINIC_OR_DEPARTMENT_OTHER): Payer: Medicare HMO

## 2018-12-07 ENCOUNTER — Emergency Department: Payer: Medicare HMO

## 2018-12-07 DIAGNOSIS — R079 Chest pain, unspecified: Secondary | ICD-10-CM | POA: Diagnosis present

## 2018-12-07 DIAGNOSIS — I25118 Atherosclerotic heart disease of native coronary artery with other forms of angina pectoris: Secondary | ICD-10-CM | POA: Diagnosis not present

## 2018-12-07 DIAGNOSIS — I208 Other forms of angina pectoris: Secondary | ICD-10-CM | POA: Diagnosis not present

## 2018-12-07 DIAGNOSIS — I10 Essential (primary) hypertension: Secondary | ICD-10-CM | POA: Diagnosis not present

## 2018-12-07 DIAGNOSIS — Z951 Presence of aortocoronary bypass graft: Secondary | ICD-10-CM

## 2018-12-07 DIAGNOSIS — I200 Unstable angina: Secondary | ICD-10-CM

## 2018-12-07 LAB — BRAIN NATRIURETIC PEPTIDE: B Natriuretic Peptide: 500 pg/mL — ABNORMAL HIGH (ref 0.0–100.0)

## 2018-12-07 LAB — TROPONIN I
Troponin I: <0.03 ng/mL (ref <0.03)
Troponin I: <0.03 ng/mL (ref <0.03)
Troponin I: <0.03 ng/mL (ref <0.03)

## 2018-12-07 LAB — TSH: TSH: 4.413 u[IU]/mL (ref 0.350–4.500)

## 2018-12-07 LAB — NM MYOCAR MULTI W/SPECT W/WALL MOTION / EF
LV dias vol: 101 mL (ref 62–150)
LV sys vol: 42 mL
Rest HR: 56 {beats}/min
SDS: 5
SRS: 0
SSS: 5
TID: 1.11

## 2018-12-07 LAB — SARS CORONAVIRUS 2 BY RT PCR (HOSPITAL ORDER, PERFORMED IN ~~LOC~~ HOSPITAL LAB): SARS Coronavirus 2: NEGATIVE

## 2018-12-07 MED ORDER — NITROGLYCERIN 2 % TD OINT
1.0000 [in_us] | TOPICAL_OINTMENT | Freq: Once | TRANSDERMAL | Status: AC
  Administered 2018-12-07: 1 [in_us] via TOPICAL
  Filled 2018-12-07: qty 1

## 2018-12-07 MED ORDER — ASPIRIN EC 325 MG PO TBEC
325.0000 mg | DELAYED_RELEASE_TABLET | Freq: Every day | ORAL | Status: DC
  Administered 2018-12-07: 12:00:00 325 mg via ORAL
  Filled 2018-12-07: qty 1

## 2018-12-07 MED ORDER — AMLODIPINE BESYLATE 10 MG PO TABS
10.0000 mg | ORAL_TABLET | Freq: Every day | ORAL | Status: DC
  Administered 2018-12-07: 12:00:00 10 mg via ORAL
  Filled 2018-12-07: qty 1

## 2018-12-07 MED ORDER — CARVEDILOL 25 MG PO TABS
25.0000 mg | ORAL_TABLET | Freq: Two times a day (BID) | ORAL | Status: DC
  Administered 2018-12-07: 25 mg via ORAL
  Filled 2018-12-07: qty 1

## 2018-12-07 MED ORDER — PANTOPRAZOLE SODIUM 40 MG PO TBEC
40.0000 mg | DELAYED_RELEASE_TABLET | Freq: Every day | ORAL | Status: DC
  Administered 2018-12-07: 40 mg via ORAL
  Filled 2018-12-07: qty 1

## 2018-12-07 MED ORDER — REGADENOSON 0.4 MG/5ML IV SOLN
0.4000 mg | Freq: Once | INTRAVENOUS | Status: AC
  Administered 2018-12-07: 0.4 mg via INTRAVENOUS

## 2018-12-07 MED ORDER — ASPIRIN 81 MG PO CHEW
324.0000 mg | CHEWABLE_TABLET | Freq: Once | ORAL | Status: AC
  Administered 2018-12-07: 324 mg via ORAL
  Filled 2018-12-07: qty 4

## 2018-12-07 MED ORDER — ACETAMINOPHEN 650 MG RE SUPP: 650.0000 mg | Freq: Four times a day (QID) | RECTAL | Status: DC | PRN

## 2018-12-07 MED ORDER — ISOSORBIDE MONONITRATE ER 30 MG PO TB24
30.0000 mg | ORAL_TABLET | Freq: Every day | ORAL | Status: DC
  Administered 2018-12-07: 30 mg via ORAL
  Filled 2018-12-07: qty 1

## 2018-12-07 MED ORDER — TECHNETIUM TC 99M TETROFOSMIN IV KIT
10.0000 | PACK | Freq: Once | INTRAVENOUS | Status: AC | PRN
  Administered 2018-12-07: 10.1 via INTRAVENOUS

## 2018-12-07 MED ORDER — ENOXAPARIN SODIUM 40 MG/0.4ML ~~LOC~~ SOLN
40.0000 mg | SUBCUTANEOUS | Status: DC
  Administered 2018-12-07: 40 mg via SUBCUTANEOUS
  Filled 2018-12-07: qty 0.4

## 2018-12-07 MED ORDER — ONDANSETRON HCL 4 MG/2ML IJ SOLN: 4.0000 mg | Freq: Four times a day (QID) | INTRAMUSCULAR | Status: DC | PRN

## 2018-12-07 MED ORDER — TRANDOLAPRIL 1 MG PO TABS
2.0000 mg | ORAL_TABLET | Freq: Every day | ORAL | Status: DC
  Administered 2018-12-07: 2 mg via ORAL
  Filled 2018-12-07: qty 2

## 2018-12-07 MED ORDER — ONDANSETRON HCL 4 MG PO TABS: 4.0000 mg | ORAL_TABLET | Freq: Four times a day (QID) | ORAL | Status: DC | PRN

## 2018-12-07 MED ORDER — ACETAMINOPHEN 325 MG PO TABS: 650.0000 mg | ORAL_TABLET | Freq: Four times a day (QID) | ORAL | Status: DC | PRN

## 2018-12-07 MED ORDER — HYDRALAZINE HCL 20 MG/ML IJ SOLN
10.0000 mg | Freq: Four times a day (QID) | INTRAMUSCULAR | Status: DC | PRN
  Administered 2018-12-07: 10 mg via INTRAVENOUS
  Filled 2018-12-07: qty 1

## 2018-12-07 MED ORDER — TECHNETIUM TC 99M TETROFOSMIN IV KIT
30.0000 | PACK | Freq: Once | INTRAVENOUS | Status: AC | PRN
  Administered 2018-12-07: 30.58 via INTRAVENOUS

## 2018-12-07 MED ORDER — AMLODIPINE BESYLATE 10 MG PO TABS: 10.0000 mg | ORAL_TABLET | Freq: Every day | ORAL | 0 refills | Status: DC

## 2018-12-07 MED ORDER — ATORVASTATIN CALCIUM 20 MG PO TABS
40.0000 mg | ORAL_TABLET | Freq: Every day | ORAL | Status: DC
  Administered 2018-12-07: 40 mg via ORAL
  Filled 2018-12-07: qty 2

## 2018-12-07 MED ORDER — ISOSORBIDE MONONITRATE ER 30 MG PO TB24: 30.0000 mg | ORAL_TABLET | Freq: Every day | ORAL | 0 refills | Status: DC

## 2018-12-07 MED ORDER — DOCUSATE SODIUM 100 MG PO CAPS
100.0000 mg | ORAL_CAPSULE | Freq: Two times a day (BID) | ORAL | Status: DC
  Administered 2018-12-07: 100 mg via ORAL
  Filled 2018-12-07: qty 1

## 2018-12-07 MED ORDER — LORATADINE 10 MG PO TABS
10.0000 mg | ORAL_TABLET | Freq: Every day | ORAL | Status: DC
  Administered 2018-12-07: 12:00:00 10 mg via ORAL
  Filled 2018-12-07: qty 1

## 2018-12-07 NOTE — Progress Notes (Signed)
Likely discharge home if stress test is negative. Accelerated hypertension, BP was very high when he came, no better, continue beta-blockers, ACE inhibitors, added Norvasc.

## 2018-12-07 NOTE — H&P (Signed)
Dale DollarOtis Wilbert Short Jr. is an 73 y.o. male.   Chief Complaint: Chest pain HPI: The patient with past medical history of coronary artery disease status post CABG, hypertension and hyperlipidemia presents to the emergency department complaining of chest pain.  The patient reports that his left chest has hurt for the last week when walking, particularly when it is hot outside.  The patient denies nausea or vomiting but admits to shortness of breath.  He reports that the pain feels similar to pain he had prior to his bypass surgery.  Upon arrival to the emergency department the patient's blood pressure was 216/116.  Nitropaste was placed on his chest.  Troponin was negative but BNP was found to be 500.  EKG was slightly changed from previous and that nonspecific T wave abnormalities in lateral leads changed to inversions.  Due to intermittent unstable angina and cardiac risk factors the emergency department staff, hospitalist service for admission.   Past Medical History:  Diagnosis Date  . Arthritis    "hands, knees" (01/09/2017)  . CAD (coronary artery disease)    a. 12/2016 Cath: LM nl, LAD 95p, 6932m, 60/90d, LCX 80p/m, 17853m, OM1 8432m, OM2 90, RCA 99p, Ef 55-65%; b. 12/2016 CABG x 4 (LIMA->mLAD, VG->dLAD, VG->OM1->OM2).  . Essential hypertension   . High cholesterol   . History of echocardiogram    a. 12/2016 Echo: EF 65-70%.    Past Surgical History:  Procedure Laterality Date  . CARDIAC CATHETERIZATION  01/09/2017   "scheduling me for [facility]"  . COLONOSCOPY WITH PROPOFOL N/A 11/09/2016   Procedure: COLONOSCOPY WITH PROPOFOL;  Surgeon: Earline MayotteByrnett, Jeffrey W, MD;  Location: [facility] ENDOSCOPY;  Service: Endoscopy;  Laterality: N/A;  . CORONARY ARTERY BYPASS GRAFT N/A 01/12/2017   CABG x 4: LIMA to mLAD, SVG to dLAD, sequential SVG to OM1-OM2, EVH via left thigh and leg;  Surgeon: Purcell Nailswen, Clarence H, MD;  [facility];  Service: Open Heart Surgery;  Laterality: N/A;  . INTRAOPERATIVE TRANSESOPHAGEAL  ECHOCARDIOGRAM N/A 01/12/2017   Procedure: INTRAOPERATIVE TRANSESOPHAGEAL ECHOCARDIOGRAM;  Surgeon: Purcell Nailswen, Clarence H, MD;  [facility];  Service: Open Heart Surgery;  Laterality: N/A;  . LEFT HEART CATH AND CORONARY ANGIOGRAPHY Left 01/09/2017   Procedure: Left Heart Cath and Coronary Angiography;  Surgeon: Iran OuchArida, Muhammad A, MD;  Location: [facility] INVASIVE CV LAB;  Service: Cardiovascular;  Laterality: Left;    Family History  Problem Relation Age of Onset  . Heart attack Mother    Social History:  reports that he has never smoked. He has never used smokeless tobacco. He reports that he does not drink alcohol or use drugs.  Allergies: No Known Allergies  Medications Prior to Admission  Medication Sig Dispense Refill  . aspirin 325 MG EC tablet Take 1 tablet (325 mg total) by mouth daily. 90 tablet 2  . atorvastatin (LIPITOR) 40 MG tablet Take 40 mg by mouth daily.    . carvedilol (COREG) 25 MG tablet Take 25 mg by mouth 2 (two) times a day.    . cetirizine (ZYRTEC) 10 MG tablet Take 10 mg by mouth daily.    . meloxicam (MOBIC) 15 MG tablet Take 15 mg by mouth daily.    . moexipril (UNIVASC) 15 MG tablet Take 15 mg by mouth at bedtime.     . pantoprazole (PROTONIX) 40 MG tablet Take 1 tablet (40 mg total) by mouth daily. 30 tablet 11    Results for orders placed or performed during the hospital encounter of 12/07/18 (from  the past 48 hour(s))  Basic metabolic panel     Status: Abnormal   Collection Time: 12/06/18 10:27 PM  Result Value Ref Range   Sodium 138 135 - 145 mmol/L    Comment: ELECTROLYTES REPEATED.Marland Kitchen.Marland Kitchen.[facility]   Potassium 3.5 3.5 - 5.1 mmol/L   Chloride 109 98 - 111 mmol/L   CO2 23 22 - 32 mmol/L   Glucose, Bld 130 (H) 70 - 99 mg/dL   BUN 16 8 - 23 mg/dL   Creatinine, Ser 1.611.24 0.61 - 1.24 mg/dL   Calcium 8.5 (L) 8.9 - 10.3 mg/dL   GFR calc non Af Amer 59 (L) >60 mL/min   GFR calc Af Amer >60 >60 mL/min   Anion gap 6 5 - 15    Comment: Performed at [facility],  [address] [place], [place] [address]  CBC     Status: None   Collection Time: 12/06/18 10:27 PM  Result Value Ref Range   WBC 8.3 4.0 - 10.5 K/uL   RBC 5.38 4.22 - 5.81 MIL/uL   Hemoglobin 14.7 13.0 - 17.0 g/dL   HCT 54.045.2 98.139.0 - 19.152.0 %   MCV 84.0 80.0 - 100.0 fL   MCH 27.3 26.0 - 34.0 pg   MCHC 32.5 30.0 - 36.0 g/dL   RDW 47.814.6 29.511.5 - 62.115.5 %   Platelets 150 150 - 400 K/uL   nRBC 0.0 0.0 - 0.2 %    Comment: Performed at [facility], [address] [place], [place] [address]  Troponin I - ONCE - STAT     Status: None   Collection Time: 12/06/18 10:27 PM  Result Value Ref Range   Troponin I <0.03 <0.03 ng/mL    Comment: Performed at [facility], [address] [place], [place] [address]  Troponin I - Once-Timed     Status: None   Collection Time: 12/07/18  1:33 AM  Result Value Ref Range   Troponin I <0.03 <0.03 ng/mL    Comment: Performed at [facility], [address] [place], [place] [address]  Brain natriuretic peptide     Status: Abnormal   Collection Time: 12/07/18  1:33 AM  Result Value Ref Range   B Natriuretic Peptide 500.0 (H) 0.0 - 100.0 pg/mL    Comment: Performed at [facility], [address] [place], [place] [address]  SARS Coronavirus 2 (CEPHEID - Performed in [facility] hospital lab), Hosp Order     Status: None   Collection Time: 12/07/18  1:33 AM   Specimen: Nasopharyngeal Swab  Result Value Ref Range   SARS Coronavirus 2 NEGATIVE NEGATIVE    Comment: (NOTE) If result is NEGATIVE SARS-CoV-2 target nucleic acids are NOT DETECTED. The SARS-CoV-2 RNA is generally detectable in upper and lower  respiratory specimens during the acute phase of infection. The lowest  concentration of SARS-CoV-2 viral copies this assay can detect is 250  copies / mL. A negative result does not preclude SARS-CoV-2 infection  and should not be used as the sole basis for treatment or other  patient management decisions.  A negative  result may occur with  improper specimen collection / handling, submission of specimen other  than nasopharyngeal swab, presence of viral mutation(s) within the  areas targeted by this assay, and inadequate number of viral copies  (<250 copies / mL). A negative result must be combined with clinical  observations, patient history, and epidemiological information. If result is POSITIVE SARS-CoV-2 target nucleic acids are DETECTED.  The SARS-CoV-2 RNA is generally detectable in upper and lower  respiratory specimens dur ing the acute phase of infection.  Positive  results are indicative of active infection with SARS-CoV-2.  Clinical  correlation with patient history and other diagnostic information is  necessary to determine patient infection status.  Positive results do  not rule out bacterial infection or co-infection with other viruses. If result is PRESUMPTIVE POSTIVE SARS-CoV-2 nucleic acids MAY BE PRESENT.   A presumptive positive result was obtained on the submitted specimen  and confirmed on repeat testing.  While 2019 novel coronavirus  (SARS-CoV-2) nucleic acids may be present in the submitted sample  additional confirmatory testing may be necessary for epidemiological  and / or clinical management purposes  to differentiate between  SARS-CoV-2 and other Sarbecovirus currently known to infect humans.  If clinically indicated additional testing with an alternate test  methodology [phone]) is advised. The SARS-CoV-2 RNA is generally  detectable in upper and lower respiratory sp ecimens during the acute  phase of infection. The expected result is Negative. Fact Sheet for Patients:  StrictlyIdeas.no Fact Sheet for Healthcare Providers: BankingDealers.co.za This test is not yet approved or cleared by the [place] FDA and has been authorized for detection and/or diagnosis of SARS-CoV-2 by FDA under an Emergency Use Authorization  (EUA).  This EUA will remain in effect (meaning this test can be used) for the duration of the COVID-19 declaration under Section 564(b)(1) of the Act, 21 U.S.C. section 360bbb-3(b)(1), unless the authorization is terminated or revoked sooner. Performed at [facility], [address] [place], [place] [address]   TSH     Status: None   Collection Time: 12/07/18  4:33 AM  Result Value Ref Range   TSH 4.413 0.350 - 4.500 uIU/mL    Comment: Performed by a 3rd Generation assay with a functional sensitivity of <=0.01 uIU/mL. Performed at [facility], [place], [place] [address]   Troponin I - Now Then Q6H     Status: None   Collection Time: 12/07/18  4:33 AM  Result Value Ref Range   Troponin I <0.03 <0.03 ng/mL    Comment: Performed at [facility], [address] [place], [place] [address]   Dg Chest [place] 1 View  Result Date: 12/07/2018 CLINICAL DATA:  73 year old male with chest pain and shortness of breath. EXAM: PORTABLE CHEST 1 VIEW COMPARISON:  02/20/2017 and earlier. FINDINGS: Portable AP upright view at 0130 hours. Previous CABG. Normal cardiac size and mediastinal contours. Visualized tracheal air column is within normal limits. Stable lung volumes. Allowing for portable technique the lungs are clear. No pneumothorax. Negative visible bowel gas pattern. IMPRESSION: Negative portable chest. Electronically Signed   By: Genevie Ann M.D.   On: 12/07/2018 01:52    Review of Systems  Constitutional: Negative for chills and fever.  HENT: Negative for sore throat and tinnitus.   Eyes: Negative for blurred vision and redness.  Respiratory: Positive for shortness of breath. Negative for cough.   Cardiovascular: Positive for chest pain. Negative for palpitations, orthopnea and PND.  Gastrointestinal: Negative for abdominal pain, diarrhea, nausea and vomiting.  Genitourinary: Negative for dysuria, frequency and urgency.  Musculoskeletal: Negative  for joint pain and myalgias.  Skin: Negative for rash.       No lesions  Neurological: Negative for speech change, focal weakness and weakness.  Endo/Heme/Allergies: Does not bruise/bleed easily.       No temperature intolerance  Psychiatric/Behavioral: Negative for depression  and suicidal ideas.    Blood pressure (!) 198/98, pulse 68, temperature 97.6 F (36.4 C), temperature source Oral, resp. rate 18, height 6' (1.829 m), weight 103.7 kg, SpO2 100 %. Physical Exam  Vitals reviewed. Constitutional: He is oriented to person, place, and time. He appears well-developed and well-nourished. No distress.  HENT:  Head: Normocephalic and atraumatic.  Mouth/Throat: Oropharynx is clear and moist.  Eyes: Pupils are equal, round, and reactive to light. Conjunctivae and EOM are normal. No scleral icterus.  Neck: Normal range of motion. Neck supple. No JVD present. No tracheal deviation present. No thyromegaly present.  Cardiovascular: Normal rate, regular rhythm and normal heart sounds. Exam reveals no gallop and no friction rub.  No murmur heard. Respiratory: Effort normal and breath sounds normal. No respiratory distress.  GI: Soft. Bowel sounds are normal. He exhibits no distension. There is no abdominal tenderness.  Genitourinary:    Genitourinary Comments: Deferred   Musculoskeletal: Normal range of motion.        General: No edema.  Lymphadenopathy:    He has no cervical adenopathy.  Neurological: He is alert and oriented to person, place, and time. No cranial nerve deficit.  Skin: Skin is warm and dry. No rash noted. No erythema.  Psychiatric: He has a normal mood and affect. His behavior is normal. Judgment and thought content normal.     Assessment/Plan This is a 73 year old male admitted for chest pain. 1.  Chest pain: Consistent with unstable angina; known history of CAD status post bypass 2 years ago.    Follow troponin and monitor telemetry.  Consult cardiology.  The patient is  n.p.o. in anticipation of cardiac catheterization. 2.  Hypertension: Uncontrolled; hydralazine as needed.  Continue ACE inhibitor and carvedilol. 3.  CAD: Continue aspirin.  Hold meloxicam 4.  Hyperlipidemia: Continue statin therapy 5.  DVT prophylaxis: Lovenox 6.  GI prophylaxis: Pantoprazole per home regimen The patient is a full code.  Time spent on admission orders and patient care approximately 45 minutes  Arnaldo Nataliamond,  Sinclair Arrazola S, MD 12/07/2018, 7:05 AM

## 2018-12-07 NOTE — Consult Note (Signed)
Cardiology Consultation:   Patient ID: Dale Cook.; [id number]; 1946-05-23   Admit date: 12/07/2018 Date of Consult: 12/07/2018  Primary Care Provider: Albina Billet, MD Primary Cardiologist: Fletcher Anon   Patient Profile:   Dale Pember. is a 73 y.o. male with a hx of CAD s/p 4-vessel CABG in 12/2016, HTN, HLD, and GERD who is being seen today for the evaluation of chest pain at the request of Dr. Marcille Blanco.  History of Present Illness:   Dale Cook underwent 3-vessel CABG in 12/2016 with LIMA-LAD, SVG-distal LAD, SVG-OM1-OM2 in the setting of unstable angina. Echo at that time showed an EF of 65-70% with calcified mitral annulus. Pre-CABG carotid artery ultrasound showed no significant disease. He was most recently seen in telemedicine follow up on 10/30/2018 and was doing well.   He presented to [facility] on 12/07/2018 with left-sided chest pain noted while walking in the heat over the past week. Pain is described as a tightness in the epigastric region. He is uncertain if this pain is similar to his prior presentation.   Upon the patient's arrival to [facility] they were found to be hypertensive with BP in the 953U systolic with subsequent improvement into the 023X systolic, with most recent readings in the 435W to 861U systolic after ambulating around the nurse station this morning. EKG showed sinus rhythm with nonspecific changes as below, CXR showed no acute process. Labs showed troponin negative x 3, COVID-19 negative, BNP 500, TSH normal, CBC unremarkable, K+ 3.5, SCr 1.24. he has received ASA 324 mg, nitropaste, and hydralazine 10 mg IV since admission. Currently, without chest pain.  Past Medical History:  Diagnosis Date  . Arthritis    "hands, knees" (01/09/2017)  . CAD (coronary artery disease)    a. 12/2016 Cath: LM nl, LAD 95p, 50m 60/90d, LCX 80p/m, 1010mOM1 6066mM2 90, RCA 99p, Ef 55-65%; b. 12/2016 CABG x 4 (LIMA->mLAD, VG->dLAD, VG->OM1->OM2).  . Essential  hypertension   . High cholesterol   . History of echocardiogram    a. 12/2016 Echo: EF 65-70%.    Past Surgical History:  Procedure Laterality Date  . CARDIAC CATHETERIZATION  01/09/2017   "scheduling me for OHS"  . COLONOSCOPY WITH PROPOFOL N/A 11/09/2016   Procedure: COLONOSCOPY WITH PROPOFOL;  Surgeon: ByrRobert BellowD;  Location: [facility] ENDOSCOPY;  Service: Endoscopy;  Laterality: N/A;  . CORONARY ARTERY BYPASS GRAFT N/A 01/12/2017   CABG x 4: LIMA to mLAD, SVG to dLAD, sequential SVG to OM1-OM2, EVH via left thigh and leg;  Surgeon: OweRexene AlbertsD;  [facility];  Laterality: N/A;  . INTRAOPERATIVE TRANSESOPHAGEAL ECHOCARDIOGRAM N/A 01/12/2017   Procedure: INTRAOPERATIVE TRANSESOPHAGEAL ECHOCARDIOGRAM;  Surgeon: OweRexene AlbertsD;  [facility] [place];  Laterality: N/A;  . LEFT HEART CATH AND CORONARY ANGIOGRAPHY Left 01/09/2017   Procedure: Left Heart Cath and Coronary Angiography;  Surgeon: AriWellington HampshireD;  [facility];  Service: Cardiovascular;  Laterality: Left;     Home Meds: Prior to Admission medications   Medication Sig Start Date End Date Taking? Authorizing Provider  aspirin 325 MG EC tablet Take 1 tablet (325 mg total) by mouth daily. 02/28/17  Yes Gollan, TimKathlene NovemberD  atorvastatin (LIPITOR) 40 MG tablet Take 40 mg by mouth daily.   Yes [provider]  carvedilol (COREG) 25 MG tablet Take 25 mg by mouth 2 (two) times a  day.   Yes [provider]  cetirizine (ZYRTEC) 10 MG tablet Take 10 mg by mouth daily.   Yes [provider]  meloxicam (MOBIC) 15 MG tablet Take 15 mg by mouth daily. 10/05/18  Yes [provider]  moexipril (UNIVASC) 15 MG tablet Take 15 mg by mouth at bedtime.  10/24/18  Yes [provider]  pantoprazole (PROTONIX) 40 MG tablet Take 1 tablet (40 mg total) by mouth daily. 02/20/18  Yes Wellington Hampshire, MD     Inpatient Medications: Scheduled Meds: . aspirin  325 mg Oral Daily  . atorvastatin  40 mg Oral Daily  . carvedilol  25 mg Oral BID  . docusate sodium  100 mg Oral BID  . enoxaparin (LOVENOX) injection  40 mg Subcutaneous Q24H  . loratadine  10 mg Oral Daily  . pantoprazole  40 mg Oral Daily  . trandolapril  2 mg Oral Daily   Continuous Infusions:  PRN Meds: acetaminophen **OR** acetaminophen, hydrALAZINE, ondansetron **OR** ondansetron (ZOFRAN) IV  Allergies:  No Known Allergies  Social History:   Social History   Socioeconomic History  . Marital status: Divorced    Spouse name: Not on file  . Number of children: Not on file  . Years of education: Not on file  . Highest education level: Not on file  Occupational History  . Not on file  Social Needs  . Financial resource strain: Not on file  . Food insecurity    Worry: Not on file    Inability: Not on file  . Transportation needs    Medical: Not on file    Non-medical: Not on file  Tobacco Use  . Smoking status: Never Smoker  . Smokeless tobacco: Never Used  Substance and Sexual Activity  . Alcohol use: No  . Drug use: No  . Sexual activity: Not Currently  Lifestyle  . Physical activity    Days per week: Not on file    Minutes per session: Not on file  . Stress: Not on file  Relationships  . Social Herbalist on phone: Not on file    Gets together: Not on file    Attends religious service: Not on file    Active member of club or organization: Not on file    Attends meetings of clubs or organizations: Not on file    Relationship status: Not on file  . Intimate partner violence    Fear of current or ex partner: Not on file    Emotionally abused: Not on file    Physically abused: Not on file    Forced sexual activity: Not on file  Other Topics Concern  . Not on file  Social History Narrative  . Not on file     Family History:   Family History  Problem Relation Age of Onset  . Heart attack  Mother     ROS:  Review of Systems  Constitutional: Positive for malaise/fatigue. Negative for chills, diaphoresis, fever and weight loss.  HENT: Negative for congestion.   Eyes: Negative for discharge and redness.  Respiratory: Negative for cough, hemoptysis, sputum production, shortness of breath and wheezing.   Cardiovascular: Positive for chest pain. Negative for palpitations, orthopnea, claudication, leg swelling and PND.  Gastrointestinal: Negative for abdominal pain, blood in stool, heartburn, melena, nausea and vomiting.  Genitourinary: Negative for hematuria.  Musculoskeletal: Negative for falls and myalgias.  Skin: Negative for rash.  Neurological: Positive for weakness. Negative for dizziness, tingling,  tremors, sensory change, speech change, focal weakness and loss of consciousness.  Endo/Heme/Allergies: Does not bruise/bleed easily.  Psychiatric/Behavioral: Negative for substance abuse. The patient is not nervous/anxious.   All other systems reviewed and are negative.     Physical Exam/Data:   Vitals:   12/07/18 0409 12/07/18 0413 12/07/18 0706 12/07/18 0756  BP:  (!) 198/98 (!) 154/83 135/73  Pulse:  68 (!) 56 65  Resp:  18    Temp:  97.6 F (36.4 C)  98 F (36.7 C)  TempSrc:  Oral  Oral  SpO2:  100% 98% 100%  Weight: 103.7 kg     Height: 6' (1.829 m)       Intake/Output Summary (Last 24 hours) at 12/07/2018 0822 Last data filed at 12/07/2018 0413 Gross per 24 hour  Intake 10 ml  Output 400 ml  Net -390 ml   Filed Weights   12/06/18 2225 12/07/18 0409  Weight: 102.1 kg 103.7 kg   Body mass index is 31.02 kg/m.   Physical Exam: General: Well developed, well nourished, in no acute distress. Head: Normocephalic, atraumatic, sclera non-icteric, no xanthomas, nares without discharge.  Neck: Negative for carotid bruits. JVD not elevated. Lungs: Clear bilaterally to auscultation without wheezes, rales, or rhonchi. Breathing is unlabored. Heart: RRR with S1  S2. No murmurs, rubs, or gallops appreciated. No chest pain with ambulation around the nurse station.  Abdomen: Soft, non-tender, non-distended with normoactive bowel sounds. No hepatomegaly. No rebound/guarding. No obvious abdominal masses. Msk:  Strength and tone appear normal for age. Extremities: No clubbing or cyanosis. No edema. Distal pedal pulses are 2+ and equal bilaterally. Neuro: Alert and oriented X 3. No facial asymmetry. No focal deficit. Moves all extremities spontaneously. Psych:  Responds to questions appropriately with a normal affect.   EKG:  The EKG was personally reviewed and demonstrates: NSR, 65 bpm, prior inferior infarct, possible prior anterior infarct, nonspecific inferolateral st/t changes Telemetry:  Telemetry was personally reviewed and demonstrates: sinus rhythm to sinus bradycardia with rates from the 50s to 70s bpm, rare PVCs, rare ventricular couplet  Weights: Filed Weights   12/06/18 2225 12/07/18 0409  Weight: 102.1 kg 103.7 kg    Relevant CV Studies: LHC 12/2016:  Prox Cx to Mid Cx lesion, 80 %stenosed.  Ost 1st Mrg to 1st Mrg lesion, 60 %stenosed.  Mid Cx lesion, 100 %stenosed.  Ost LAD to Prox LAD lesion, 95 %stenosed.  Mid LAD lesion, 60 %stenosed.  Dist LAD-2 lesion, 60 %stenosed.  Dist LAD-1 lesion, 90 %stenosed.  Ost 2nd Mrg to 2nd Mrg lesion, 90 %stenosed.  Prox RCA to Mid RCA lesion, 99 %stenosed.  The left ventricular systolic function is normal.  LV end diastolic pressure is normal.  The left ventricular ejection fraction is 55-65% by visual estimate.   1. 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 2. Normal LV systolic function and high normal left ventricular end-diastolic pressure.  Recommendations: Overall difficult revascularization options given that the diffuse disease. PCI will require long segments of stenting especially the LAD. Also the  left circumflex disease is a bifurcation stenosis involving 2 large OM branches. CABG is limited by significant distal LAD disease. Given severity of disease, the patient will need to be hospitalized and started on a heparin drip 8 hours after sheath pull. I switched diltiazem to metoprolol. Continue atorvastatin. Transferr to [facility] for a heart team decision and consultation with CVTS. It might be possible to place a graft  on both mid and distal LAD. __________  2D Echo 12/2016: - Left ventricle: The cavity size was normal. Wall thickness was   normal. Systolic function was vigorous. The estimated ejection   fraction was in the range of 65% to 70%. - Mitral valve: Calcified annulus. Mildly thickened leaflets. __________  TEE 12/2016: - Left ventricle: Hypertrophy was noted. Systolic function was   normal. The estimated ejection fraction was in the range of 50%   to 55%. - Aortic valve: There was trivial regurgitation. - Mitral valve: Mildly calcified annulus. Mildly thickened leaflets   . - Left atrium: No evidence of thrombus in the atrial cavity or   appendage. No evidence of thrombus in the appendage. - Right atrium: No evidence of thrombus in the atrial cavity or   appendage. - Atrial septum: No defect or patent foramen ovale was identified. - Tricuspid valve: No evidence of vegetation.  Impressions:  - No change from pre-bypass images. Post-bypass Impression:     1. Aortic Valve: Trace AI, unchanged from pre-bypass   2. Mitral Valve: Moderate MAC, trace MR, unchanged from   pre-bypass.   3. LV: EF 5--55% no regional wall motion abnormalities. No   intra-cardiac air noted.   4. RV: Normal size, normal RV systolic function   5. Tricuspid valve: normal appearing leaflets, trace TR.  Laboratory Data:  Chemistry Recent Labs  Lab 12/06/18 2227  NA 138  K 3.5  CL 109  CO2 23  GLUCOSE 130*  BUN 16  CREATININE 1.24  CALCIUM 8.5*  GFRNONAA 59*  GFRAA >60  ANIONGAP 6     No results for input(s): PROT, ALBUMIN, AST, ALT, ALKPHOS, BILITOT in the last 168 hours. Hematology Recent Labs  Lab 12/06/18 2227  WBC 8.3  RBC 5.38  HGB 14.7  HCT 45.2  MCV 84.0  MCH 27.3  MCHC 32.5  RDW 14.6  PLT 150   Cardiac Enzymes Recent Labs  Lab 12/06/18 2227 12/07/18 0133 12/07/18 0433  TROPONINI <0.03 <0.03 <0.03   No results for input(s): TROPIPOC in the last 168 hours.  BNP Recent Labs  Lab 12/07/18 0133  BNP 500.0*    DDimer No results for input(s): DDIMER in the last 168 hours.  Radiology/Studies:  Dg Chest Port 1 View  Result Date: 12/07/2018 IMPRESSION: Negative portable chest. Electronically Signed   By: Genevie Ann M.D.   On: 12/07/2018 01:52    Assessment and Plan:   1. CAD status post CABG in 12/2016 with chest pain with moderate risk for cardiac etiology: -Currently chest pain free -Ruled out -He ambulated with Dr. Rockey Situ this morning around the nurse station without symptoms -Cannot exclude symptoms occurring in the setting of his elevated BP as below -Escalate antihypertensive therapy as below -Myoview today to evaluate for high risk ischemia  -ASA  2. Accelerated HTN: -Presented with BP > 185 mmHg systolic -BP in the 631S to 970Y systolic after ambulating this morning -Beta blocker and ACEi (has not yet received these this admission)  3. HLD: -Check lipid panel -Goal LDL < 70 -Lipitor    For questions or updates, please contact [facility] Please consult [email or web address] for contact info under Cardiology/STEMI.   Signed, Christell Faith, PA-C [facility] Pager: [phone] 12/07/2018, 8:22 AM

## 2018-12-07 NOTE — ED Notes (Signed)
Admitting MD in with Patient.

## 2018-12-07 NOTE — ED Notes (Signed)
ED TO INPATIENT HANDOFF REPORT  ED Nurse Name and Phone #: Joelene Millin [phone]  S Name/Age/Gender Dale Cook. 73 y.o. male Room/Bed: ED02A/ED02A  Code Status   Code Status: Prior  Home/SNF/Other Home Patient oriented to: self, place, time and situation Is this baseline? Yes   Triage Complete: Triage complete  Chief Complaint Chest pain  Triage Note Patient c/o medial chest pain and SOB. Patient reports pain worsens with exertion. Patient reports hx of triple bypass.    Allergies Allergies  Allergen Reactions  . Other     Level of Care/Admitting Diagnosis ED Disposition    ED Disposition Condition [facility] Area: [place] [[id number]]  Level of Care: Telemetry [5]  Covid Evaluation: Confirmed COVID Negative  Diagnosis: Chest pain [981191]  Admitting Physician: Harrie Foreman [[id number]]  Attending Physician: Harrie Foreman [[id number]]  PT Class (Do Not Modify): Observation [104]  PT Acc Code (Do Not Modify): Observation [10022]       B Medical/Surgery History Past Medical History:  Diagnosis Date  . Arthritis    "hands, knees" (01/09/2017)  . CAD (coronary artery disease)    a. 12/2016 Cath: LM nl, LAD 95p, 52m, 60/90d, LCX 80p/m, 166m, OM1 48m, OM2 90, RCA 99p, Ef 55-65%; b. 12/2016 CABG x 4 (LIMA->mLAD, VG->dLAD, VG->OM1->OM2).  . Essential hypertension   . High cholesterol   . History of echocardiogram    a. 12/2016 Echo: EF 65-70%.   Past Surgical History:  Procedure Laterality Date  . CARDIAC CATHETERIZATION  01/09/2017   "scheduling me for OHS"  . COLONOSCOPY WITH PROPOFOL N/A 11/09/2016   Procedure: COLONOSCOPY WITH PROPOFOL;  Surgeon: Robert Bellow, MD;  Location: [facility] ENDOSCOPY;  Service: Endoscopy;  Laterality: N/A;  . CORONARY ARTERY BYPASS GRAFT N/A 01/12/2017   CABG x 4: LIMA to mLAD, SVG to dLAD, sequential SVG to OM1-OM2, EVH via left thigh and leg;  Surgeon: Rexene Alberts, MD;   Location: [place];  Service: Open Heart Surgery;  Laterality: N/A;  . INTRAOPERATIVE TRANSESOPHAGEAL ECHOCARDIOGRAM N/A 01/12/2017   Procedure: INTRAOPERATIVE TRANSESOPHAGEAL ECHOCARDIOGRAM;  Surgeon: Rexene Alberts, MD;  Location: [place];  Service: Open Heart Surgery;  Laterality: N/A;  . LEFT HEART CATH AND CORONARY ANGIOGRAPHY Left 01/09/2017   Procedure: Left Heart Cath and Coronary Angiography;  Surgeon: Wellington Hampshire, MD;  Location: [place] [facility];  Service: Cardiovascular;  Laterality: Left;     A IV Location/Drains/Wounds Patient Lines/Drains/Airways Status   Active Line/Drains/Airways    Name:   Placement date:   Placement time:   Site:   Days:   Incision (Closed) 01/12/17 Chest Other (Comment)   01/12/17    0922     694   Incision (Closed) 01/12/17 Leg Left   01/12/17    0922     694   Incision (Closed) 01/12/17 Leg Right   01/12/17    0922     694          Intake/Output Last 24 hours  Intake/Output Summary (Last 24 hours) at 12/07/2018 0321 Last data filed at 12/07/2018 7846 Gross per 24 hour  Intake 10 ml  Output -  Net 10 ml    Labs/Imaging Results for orders placed or performed during the hospital encounter of 12/07/18 (from the past 48 hour(s))  Basic metabolic panel     Status: Abnormal   Collection Time: 12/06/18 10:27 PM  Result Value Ref Range   Sodium 138 135 -  145 mmol/L    Comment: ELECTROLYTES REPEATED.Marland Kitchen.Marland Kitchen.[facility]   Potassium 3.5 3.5 - 5.1 mmol/L   Chloride 109 98 - 111 mmol/L   CO2 23 22 - 32 mmol/L   Glucose, Bld 130 (H) 70 - 99 mg/dL   BUN 16 8 - 23 mg/dL   Creatinine, Ser 5.621.24 0.61 - 1.24 mg/dL   Calcium 8.5 (L) 8.9 - 10.3 mg/dL   GFR calc non Af Amer 59 (L) >60 mL/min   GFR calc Af Amer >60 >60 mL/min   Anion gap 6 5 - 15    Comment: Performed at [facility], [address] [place], [place] [address]  CBC     Status: None   Collection Time: 12/06/18 10:27 PM  Result Value Ref Range   WBC 8.3 4.0 - 10.5 K/uL   RBC 5.38 4.22 -  5.81 MIL/uL   Hemoglobin 14.7 13.0 - 17.0 g/dL   HCT 57.845.2 46.939.0 - 62.952.0 %   MCV 84.0 80.0 - 100.0 fL   MCH 27.3 26.0 - 34.0 pg   MCHC 32.5 30.0 - 36.0 g/dL   RDW 52.814.6 41.311.5 - 24.415.5 %   Platelets 150 150 - 400 K/uL   nRBC 0.0 0.0 - 0.2 %    Comment: Performed at [facility], [address] [place], [place] [address]  Troponin I - ONCE - STAT     Status: None   Collection Time: 12/06/18 10:27 PM  Result Value Ref Range   Troponin I <0.03 <0.03 ng/mL    Comment: Performed at [facility], [address] [place], [place] [address]  Troponin I - Once-Timed     Status: None   Collection Time: 12/07/18  1:33 AM  Result Value Ref Range   Troponin I <0.03 <0.03 ng/mL    Comment: Performed at [facility], [address] [place], [place] [address]  Brain natriuretic peptide     Status: Abnormal   Collection Time: 12/07/18  1:33 AM  Result Value Ref Range   B Natriuretic Peptide 500.0 (H) 0.0 - 100.0 pg/mL    Comment: Performed at [facility], [address] [place], [place] [address]  SARS Coronavirus 2 (CEPHEID - Performed in [facility]), Hosp Order     Status: None   Collection Time: 12/07/18  1:33 AM   Specimen: Nasopharyngeal Swab  Result Value Ref Range   SARS Coronavirus 2 NEGATIVE NEGATIVE    Comment: (NOTE) If result is NEGATIVE SARS-CoV-2 target nucleic acids are NOT DETECTED. The SARS-CoV-2 RNA is generally detectable in upper and lower  respiratory specimens during the acute phase of infection. The lowest  concentration of SARS-CoV-2 viral copies this assay can detect is 250  copies / mL. A negative result does not preclude SARS-CoV-2 infection  and should not be used as the sole basis for treatment or other  patient management decisions.  A negative result may occur with  improper specimen collection / handling, submission of specimen other  than nasopharyngeal swab, presence of viral mutation(s) within the  areas  targeted by this assay, and inadequate number of viral copies  (<250 copies / mL). A negative result must be combined with clinical  observations, patient history, and epidemiological information. If result is POSITIVE SARS-CoV-2 target nucleic acids are DETECTED. The SARS-CoV-2 RNA is generally detectable in upper and lower  respiratory specimens dur ing the acute phase of infection.  Positive  results are indicative of active infection with SARS-CoV-2.  Clinical  correlation with patient history and other diagnostic information is  necessary to determine patient infection status.  Positive results do  not rule out bacterial infection or co-infection with other viruses. If result is PRESUMPTIVE POSTIVE SARS-CoV-2 nucleic acids MAY BE PRESENT.   A presumptive positive result was obtained on the submitted specimen  and confirmed on repeat testing.  While 2019 novel coronavirus  (SARS-CoV-2) nucleic acids may be present in the submitted sample  additional confirmatory testing may be necessary for epidemiological  and / or clinical management purposes  to differentiate between  SARS-CoV-2 and other Sarbecovirus currently known to infect humans.  If clinically indicated additional testing with an alternate test  methodology [phone]([id number]) is advised. The SARS-CoV-2 RNA is generally  detectable in upper and lower respiratory sp ecimens during the acute  phase of infection. The expected result is Negative. Fact Sheet for Patients:  BoilerBrush.com.cyhttps://www.fda.gov/media/136312/download Fact Sheet for Healthcare Providers: https://pope.com/https://www.fda.gov/media/136313/download This test is not yet approved or cleared by the [place] FDA and has been authorized for detection and/or diagnosis of SARS-CoV-2 by FDA under an Emergency Use Authorization (EUA).  This EUA will remain in effect (meaning this test can be used) for the duration of the COVID-19 declaration under Section 564(b)(1) of the Act, 21 U.S.C. section  360bbb-3(b)(1), unless the authorization is terminated or revoked sooner. Performed at [facility], [address] [place], [place] [address]    Dg Chest [place]  Result Date: 12/07/2018 CLINICAL DATA:  73 year old male with chest pain and shortness of breath. EXAM: PORTABLE CHEST 1 VIEW COMPARISON:  02/20/2017 and earlier. FINDINGS: Portable AP upright view at 0130 hours. Previous CABG. Normal cardiac size and mediastinal contours. Visualized tracheal air column is within normal limits. Stable lung volumes. Allowing for portable technique the lungs are clear. No pneumothorax. Negative visible bowel gas pattern. IMPRESSION: Negative portable chest. Electronically Signed   By: Odessa FlemingH  Hall M.D.   On: 12/07/2018 01:52    Pending Labs [employer]resulted Labs (From admission, onward)    Start     Ordered   Signed and Held  HIV antibody (Routine Testing)  Once,   R     Signed and Held   Signed and Held  Creatinine, serum  (enoxaparin (LOVENOX)    CrCl >/= 30 ml/min)  Weekly,   R    Comments: while on enoxaparin therapy    Signed and Held   Signed and Held  TSH  Add-on,   R     Signed and Held   Signed and Held  Troponin I - Now Then Q6H  Now then every 6 hours,   R     Signed and Held          Vitals/Pain Today's Vitals   12/07/18 0159 12/07/18 0200 12/07/18 0215 12/07/18 0230  BP: (!) 205/96 (!) 206/86 (!) 184/88 (!) 179/81  Pulse: (!) 56 65 60 (!) 52  Resp: 16 19 (!) 21 14  Temp:      SpO2: 100% 100% 97% 93%  Weight:      Height:      PainSc: 0-No pain       Isolation Precautions No active isolations  Medications Medications  sodium chloride flush (NS) 0.9 % injection 3 mL (3 mLs Intravenous Given 12/07/18 0137)  aspirin chewable tablet 324 mg (324 mg Oral Given 12/07/18 0157)  nitroGLYCERIN (NITROGLYN) 2 % ointment 1 inch (1 inch Topical Given 12/07/18 0156)    Mobility walks Low fall  risk   Focused Assessments Cardiac Assessment Handoff:    Lab Results   Component Value Date   TROPONINI <0.03 12/07/2018   No results found for: DDIMER Does the Patient currently have chest pain? No     R Recommendations: See Admitting Provider Note  Report given to:   Additional Notes:

## 2018-12-07 NOTE — Discharge Summary (Signed)
Dale Cook., is a 73 y.o. male  DOB July 06, 1945  MRN [id number].  Admission date:  12/07/2018  Admitting Physician  Harrie Foreman, MD  Discharge Date:  12/07/2018   Primary MD  Albina Billet, MD  Recommendations for primary care physician for things to follow:   Follow-up with PCP in 1 week  Follow-up with Dr. Fletcher Anon in 3 to 4 days Admission Diagnosis  Unstable angina ([place]) [I20.0] Essential hypertension [I10] Chest pain, unspecified type [R07.9]   Discharge Diagnosis  Unstable angina ([place]) [I20.0] Essential hypertension [I10] Chest pain, unspecified type [R07.9]   Active Problems:   Chest pain      Past Medical History:  Diagnosis Date  . Arthritis    "hands, knees" (01/09/2017)  . CAD (coronary artery disease)    a. 12/2016 Cath: LM nl, LAD 95p, 12m, 60/90d, LCX 80p/m, 184m, OM1 71m, OM2 90, RCA 99p, Ef 55-65%; b. 12/2016 CABG x 4 (LIMA->mLAD, VG->dLAD, VG->OM1->OM2).  . Essential hypertension   . High cholesterol   . History of echocardiogram    a. 12/2016 Echo: EF 65-70%.    Past Surgical History:  Procedure Laterality Date  . CARDIAC CATHETERIZATION  01/09/2017   "scheduling me for OHS"  . COLONOSCOPY WITH PROPOFOL N/A 11/09/2016   Procedure: COLONOSCOPY WITH PROPOFOL;  Surgeon: Robert Bellow, MD;  Location: [facility] ENDOSCOPY;  Service: Endoscopy;  Laterality: N/A;  . CORONARY ARTERY BYPASS GRAFT N/A 01/12/2017   CABG x 4: LIMA to mLAD, SVG to dLAD, sequential SVG to OM1-OM2, EVH via left thigh and leg;  Surgeon: Rexene Alberts, MD;  Location: [place];  Service: Open Heart Surgery;  Laterality: N/A;  . INTRAOPERATIVE TRANSESOPHAGEAL ECHOCARDIOGRAM N/A 01/12/2017   Procedure: INTRAOPERATIVE TRANSESOPHAGEAL ECHOCARDIOGRAM;  Surgeon: Rexene Alberts, MD;  Location: [place];  Service: Open Heart  Surgery;  Laterality: N/A;  . LEFT HEART CATH AND CORONARY ANGIOGRAPHY Left 01/09/2017   Procedure: Left Heart Cath and Coronary Angiography;  Surgeon: Wellington Hampshire, MD;  Location: [place] [facility];  Service: Cardiovascular;  Laterality: Left;       History of present illness and  Hospital Course:     Kindly see H&P for history of present illness and admission details, please review complete Labs, Consult reports and Test reports for all details in brief  HPI  from the history and physical done on the day of admission 73 year old male with history of CAD, CABG in 2018, hyperlipidemia, hypertension came in because of chest pain over the past 1 week, patient found to have severely elevated blood pressures with systolic more than 782.  Patient is admitted to chest pain.   Hospital Course  #1. 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  Patient troponins have been negative, patient's EKG showed normal sinus rhythm with nonspecific changes.  Patient received aspirin, Nitropaste, hydralazine 10 mg IV since admission, seen by cardiology, had a Lexiscan stress test which is negative for any ischemia.  So I spoke with Dr. Rockey Situ who recommended patient can be discharged if the blood pressure improves.  #2. accelerated hypertension, presented with blood pressure more than 956 mm systolic, we added the Norvasc, Imdur, patient can continue his home dose beta-blockers, ACE inhibitors in addition to Norvasc and Imdur that was started here.  Patient can follow-up with Dr. Fletcher Anon next week for follow-up of BP, medication adjustment. 3.  Hyperlipidemia, continue Lipitor. #4 history  of CAD, CABG.  Continue aspirin, statins, beta-blockers, ACE inhibitor's, Imdur has been added in addition to Norvasc.  Discharge Condition: Stable   Follow UP  Follow-up Information    Jaclyn Shaggyate, Denny C, MD. Schedule an appointment as  soon as possible for a visit in 3 day(s).   Specialty: Internal Medicine Contact information: [address]   [place] [address] [phone]        Iran OuchArida, Muhammad A, MD. Schedule an appointment as soon as possible for a visit in 3 day(s).   Specialty: Cardiology Contact information: [address] [place] [address] [phone]             Discharge Instructions  and  Discharge Medications      Allergies as of 12/07/2018   No Known Allergies     Medication List    TAKE these medications   amLODipine 10 MG tablet Commonly known as: NORVASC Take 1 tablet (10 mg total) by mouth daily. Start taking on: December 08, 2018   aspirin 325 MG EC tablet Take 1 tablet (325 mg total) by mouth daily.   atorvastatin 40 MG tablet Commonly known as: LIPITOR Take 40 mg by mouth daily.   carvedilol 25 MG tablet Commonly known as: COREG Take 25 mg by mouth 2 (two) times a day.   cetirizine 10 MG tablet Commonly known as: ZYRTEC Take 10 mg by mouth daily.   isosorbide mononitrate 30 MG 24 hr tablet Commonly known as: IMDUR Take 1 tablet (30 mg total) by mouth daily.   meloxicam 15 MG tablet Commonly known as: MOBIC Take 15 mg by mouth daily.   moexipril 15 MG tablet Commonly known as: UNIVASC Take 15 mg by mouth at bedtime.   pantoprazole 40 MG tablet Commonly known as: PROTONIX Take 1 tablet (40 mg total) by mouth daily.         Diet and Activity recommendation: See Discharge Instructions above   Consults obtained -cardiology   Major procedures and Radiology Reports - PLEASE review detailed and final reports for all details, in brief -      Nm Myocar Multi W/spect W/wall Motion / Ef  Result Date: 12/07/2018 Pharmacological myocardial perfusion imaging study with no significant  ischemia Normal wall motion, EF estimated at 66% No EKG changes concerning for ischemia at peak stress or in recovery. Nonspecific T wave ABN on  resting EKG V3 to V6 Low risk scan Signed, Dossie Arbourim Gollan, MD, Ph.D [facility]   Dg Chest [place] 1 View  Result Date: 12/07/2018 CLINICAL DATA:  73 year old male with chest pain and shortness of breath. EXAM: PORTABLE CHEST 1 VIEW COMPARISON:  02/20/2017 and earlier. FINDINGS: Portable AP upright view at 0130 hours. Previous CABG. Normal cardiac size and mediastinal contours. Visualized tracheal air column is within normal limits. Stable lung volumes. Allowing for portable technique the lungs are clear. No pneumothorax. Negative visible bowel gas pattern. IMPRESSION: Negative portable chest. Electronically Signed   By: Odessa FlemingH  Hall M.D.   On: 12/07/2018 01:52    Micro Results    Recent Results (from the past 240 hour(s))  SARS Coronavirus 2 (CEPHEID - Performed in [facility] lab), Hosp Order     Status: None   Collection Time: 12/07/18  1:33 AM   Specimen: Nasopharyngeal Swab  Result Value Ref Range Status   SARS Coronavirus 2 NEGATIVE NEGATIVE Final    Comment: (NOTE) If result is NEGATIVE SARS-CoV-2 target nucleic acids are NOT DETECTED. The  SARS-CoV-2 RNA is generally detectable in upper and lower  respiratory specimens during the acute phase of infection. The lowest  concentration of SARS-CoV-2 viral copies this assay can detect is 250  copies / mL. A negative result does not preclude SARS-CoV-2 infection  and should not be used as the sole basis for treatment or other  patient management decisions.  A negative result may occur with  improper specimen collection / handling, submission of specimen other  than nasopharyngeal swab, presence of viral mutation(s) within the  areas targeted by this assay, and inadequate number of viral copies  (<250 copies / mL). A negative result must be combined with clinical  observations, patient history, and epidemiological information. If result is POSITIVE SARS-CoV-2 target nucleic acids are DETECTED. The SARS-CoV-2 RNA is generally detectable  in upper and lower  respiratory specimens dur ing the acute phase of infection.  Positive  results are indicative of active infection with SARS-CoV-2.  Clinical  correlation with patient history and other diagnostic information is  necessary to determine patient infection status.  Positive results do  not rule out bacterial infection or co-infection with other viruses. If result is PRESUMPTIVE POSTIVE SARS-CoV-2 nucleic acids MAY BE PRESENT.   A presumptive positive result was obtained on the submitted specimen  and confirmed on repeat testing.  While 2019 novel coronavirus  (SARS-CoV-2) nucleic acids may be present in the submitted sample  additional confirmatory testing may be necessary for epidemiological  and / or clinical management purposes  to differentiate between  SARS-CoV-2 and other Sarbecovirus currently known to infect humans.  If clinically indicated additional testing with an alternate test  methodology [phone]([id number]) is advised. The SARS-CoV-2 RNA is generally  detectable in upper and lower respiratory sp ecimens during the acute  phase of infection. The expected result is Negative. Fact Sheet for Patients:  BoilerBrush.com.cyhttps://www.fda.gov/media/136312/download Fact Sheet for Healthcare Providers: https://pope.com/https://www.fda.gov/media/136313/download This test is not yet approved or cleared by the [place] FDA and has been authorized for detection and/or diagnosis of SARS-CoV-2 by FDA under an Emergency Use Authorization (EUA).  This EUA will remain in effect (meaning this test can be used) for the duration of the COVID-19 declaration under Section 564(b)(1) of the Act, 21 U.S.C. section 360bbb-3(b)(1), unless the authorization is terminated or revoked sooner. Performed at [facility], [address] [place], [place] [address]        Today   Subjective:   Dale PickingOtis Cook today has no headache,no chest abdominal pain,no new weakness tingling or numbness, feels much better  wants to go home today.  Objective:   Blood pressure (!) 145/82, pulse 73, temperature 97.7 F (36.5 C), temperature source Oral, resp. rate 18, height 6' (1.829 m), weight 103.7 kg, SpO2 100 %.   Intake/Output Summary (Last 24 hours) at 12/07/2018 1357 Last data filed at 12/07/2018 0413 Gross per 24 hour  Intake 10 ml  Output 400 ml  Net -390 ml    Exam Awake Alert, Oriented x 3, No new F.N deficits, Normal affect [place].AT,PERRAL Supple Neck,No JVD, No cervical lymphadenopathy appriciated.  Symmetrical Chest wall movement, Good air movement bilaterally, CTAB RRR,No Gallops,Rubs or new Murmurs, No Parasternal Heave +ve B.Sounds, Abd Soft, Non tender, No organomegaly appriciated, No rebound -guarding or rigidity. No Cyanosis, Clubbing or edema, No new Rash or bruise  Data Review   CBC w Diff:  Lab Results  Component Value Date   WBC 8.3 12/06/2018   HGB 14.7 12/06/2018   HCT 45.2 12/06/2018  PLT 150 12/06/2018    CMP:  Lab Results  Component Value Date   NA 138 12/06/2018   K 3.5 12/06/2018   CL 109 12/06/2018   CO2 23 12/06/2018   BUN 16 12/06/2018   CREATININE 1.24 12/06/2018   PROT 7.4 03/17/2017   ALBUMIN 4.0 03/17/2017   BILITOT 0.8 03/17/2017   ALKPHOS 110 03/17/2017   AST 17 03/17/2017   ALT 15 (L) 03/17/2017  .   Total Time in preparing paper work, data evaluation and todays exam - 35 minutes  Katha HammingSnehalatha Iley Deignan M.D on 12/07/2018 at 1:57 PM    Note: This dictation was prepared with Dragon dictation along with smaller phrase technology. Any transcriptional errors that result from this process are unintentional.

## 2018-12-07 NOTE — Progress Notes (Signed)
Patient given discharge instructions with prescriptions. Patient verbalized understanding with no questions or concerns. IV taken out and tele monitor off.

## 2018-12-07 NOTE — ED Provider Notes (Signed)
[facility] Emergency Department Provider Note   ____________________________________________   First MD Initiated Contact with Patient 12/07/18 0127     (approximate)  I have reviewed the triage vital signs and the nursing notes.   HISTORY  Chief Complaint Chest Pain    HPI Dale Cook. is a 73 y.o. male who presents to the ED from home with a chief complaint of chest pain and dyspnea on exertion.  Patient has a history of CAD status post CABG who complains of DOE for the past day.  Denies associated diaphoresis, nausea/vomiting, palpitations or dizziness.  Denies recent fever, cough, abdominal pain.  Denies recent travel, trauma or exposure to persons diagnosed with coronavirus.       Past Medical History:  Diagnosis Date  . Arthritis    "hands, knees" (01/09/2017)  . CAD (coronary artery disease)    a. 12/2016 Cath: LM nl, LAD 95p, 2m, 60/90d, LCX 80p/m, 144m, OM1 32m, OM2 90, RCA 99p, Ef 55-65%; b. 12/2016 CABG x 4 (LIMA->mLAD, VG->dLAD, VG->OM1->OM2).  . Essential hypertension   . High cholesterol   . History of echocardiogram    a. 12/2016 Echo: EF 65-70%.    Patient Active Problem List   Diagnosis Date Noted  . Chest pain 12/07/2018  . Hyperlipidemia with target low density lipoprotein (LDL) cholesterol less than 70 mg/dL 01/16/2017  . 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 Unstable angina ([place])   . Essential hypertension   . Encounter for screening colonoscopy 09/21/2016    Past Surgical History:  Procedure Laterality Date  . CARDIAC CATHETERIZATION  01/09/2017   "scheduling me for OHS"  . COLONOSCOPY WITH PROPOFOL N/A 11/09/2016   Procedure: COLONOSCOPY WITH PROPOFOL;  Surgeon: Robert Bellow, MD;  Location: [facility] ENDOS[facility];  Service: Endoscopy;  Laterality: N/A;  . CORONARY ARTERY BYPASS GRAFT N/A 01/12/2017   CABG x 4: LIMA to mLAD,  SVG to dLAD, sequential SVG to OM1-OM2, EVH via left thigh and leg;  Surgeon: Rexene Alberts, MD;  Location: [place];  Service: Open Heart Surgery;  Laterality: N/A;  . INTRAOPERATIVE TRANSESOPHAGEAL ECHOCARDIOGRAM N/A 01/12/2017   Procedure: INTRAOPERATIVE TRANSESOPHAGEAL ECHOCARDIOGRAM;  Surgeon: Rexene Alberts, MD;  Location: [place];  Service: Open Heart Surgery;  Laterality: N/A;  . LEFT HEART CATH AND CORONARY ANGIOGRAPHY Left 01/09/2017   Procedure: Left Heart Cath and Coronary Angiography;  Surgeon: Wellington Hampshire, MD;  Location: [place] [facility];  Service: Cardiovascular;  Laterality: Left;    Prior to Admission medications   Medication Sig Start Date End Date Taking? Authorizing Provider  aspirin 325 MG EC tablet Take 1 tablet (325 mg total) by mouth daily. 02/28/17  Yes Gollan, Kathlene November, MD  atorvastatin (LIPITOR) 40 MG tablet Take 40 mg by mouth daily.   Yes [provider]  carvedilol (COREG) 25 MG tablet Take 25 mg by mouth 2 (two) times a day.   Yes [provider]  cetirizine (ZYRTEC) 10 MG tablet Take 10 mg by mouth daily.   Yes [provider]  meloxicam (MOBIC) 15 MG tablet Take 15 mg by mouth daily. 10/05/18  Yes [provider]  moexipril (UNIVASC) 15 MG tablet Take 15 mg by mouth at bedtime.  10/24/18  Yes [provider]  pantoprazole (PROTONIX) 40 MG tablet Take 1 tablet (40 mg total) by mouth daily. 02/20/18  Yes Wellington Hampshire, MD  amLODipine (NORVASC) 10 MG tablet Take 1 tablet (10 mg total) by mouth daily. 12/08/18   Katha HammingKonidena, Snehalatha, MD  isosorbide mononitrate (IMDUR) 30 MG 24 hr tablet Take 1 tablet (30 mg total) by mouth daily. 12/07/18   Katha HammingKonidena, Snehalatha, MD    Allergies Patient has no known allergies.  Family History  Problem Relation Age of Onset  . Heart attack Mother     Social History Social History   Tobacco Use  . Smoking status: Never Smoker  . Smokeless tobacco: Never Used  Substance Use  Topics  . Alcohol use: No  . Drug use: No    Review of Systems  Constitutional: No fever/chills Eyes: No visual changes. ENT: No sore throat. Cardiovascular: Positive for chest pain. Respiratory: Positive for dyspnea on exertion. Gastrointestinal: No abdominal pain.  No nausea, no vomiting.  No diarrhea.  No constipation. Genitourinary: Negative for dysuria. Musculoskeletal: Negative for back pain. Skin: Negative for rash. Neurological: Negative for headaches, focal weakness or numbness.   ____________________________________________   PHYSICAL EXAM:  VITAL SIGNS: ED Triage Vitals [12/06/18 2225]  Enc Vitals Group     BP (!) 206/116     Pulse Rate 68     Resp 18     Temp 97.8 F (36.6 C)     Temp src      SpO2 100 %     Weight 225 lb (102.1 kg)     Height 6' (1.829 m)     Head Circumference      Peak Flow      Pain Score 7     Pain Loc      Pain Edu?      Excl. in GC?     Constitutional: Alert and oriented. Well appearing and in no acute distress. Eyes: Conjunctivae are normal. PERRL. EOMI. Head: Atraumatic. Nose: No congestion/rhinnorhea. Mouth/Throat: Mucous membranes are moist.  Oropharynx non-erythematous. Neck: No stridor.   Cardiovascular: Normal rate, regular rhythm. Grossly normal heart sounds.  Good peripheral circulation. Respiratory: Normal respiratory effort.  No retractions. Lungs CTAB. Gastrointestinal: Soft and nontender. No distention. No abdominal bruits. No CVA tenderness. Musculoskeletal: No lower extremity tenderness nor edema.  No joint effusions. Neurologic:  Normal speech and language. No gross focal neurologic deficits are appreciated. No gait instability. Skin:  Skin is warm, dry and intact. No rash noted. Psychiatric: Mood and affect are normal. Speech and behavior are normal.  ____________________________________________   LABS (all labs ordered are listed, but only abnormal results are displayed)  Labs Reviewed  BASIC  METABOLIC PANEL - Abnormal; Notable for the following components:      Result Value   Glucose, Bld 130 (*)    Calcium 8.5 (*)    GFR calc non Af Amer 59 (*)    All other components within normal limits  BRAIN NATRIURETIC PEPTIDE - Abnormal; Notable for the following components:   B Natriuretic Peptide 500.0 (*)    All other components within normal limits  SARS CORONAVIRUS 2 (HOSPITAL ORDER, PERFORMED IN [facility])  CBC  TROPONIN I  TROPONIN I  HIV ANTIBODY (ROUTINE TESTING W REFLEX)  TSH  TROPONIN I  TROPONIN I   ____________________________________________  EKG  ED ECG REPORT I, Jakeisha Stricker J, the attending physician, personally viewed and interpreted this ECG.   Date: 12/07/2018  EKG Time: 2220  Rate: 65  Rhythm: normal EKG, normal sinus rhythm  Axis: Normal  Intervals:none  ST&T Change: Nonspecific  ____________________________________________  RADIOLOGY  ED MD  interpretation: No acute cardiopulmonary process  Official radiology report(s): No results found.  ____________________________________________   PROCEDURES  Procedure(s) performed (including Critical Care):  Procedures  CRITICAL CARE Performed by: Irean HongSUNG,Jobany Montellano J   Total critical care time: 30 minutes  Critical care time was exclusive of separately billable procedures and treating other patients.  Critical care was necessary to treat or prevent imminent or life-threatening deterioration.  Critical care was time spent personally by me on the following activities: development of treatment plan with patient and/or surrogate as well as nursing, discussions with consultants, evaluation of patient's response to treatment, examination of patient, obtaining history from patient or surrogate, ordering and performing treatments and interventions, ordering and review of laboratory studies, ordering and review of radiographic studies, pulse oximetry and re-evaluation of patient's condition.  ____________________________________________   INITIAL IMPRESSION / ASSESSMENT AND PLAN / ED COURSE  As part of my medical decision making, I reviewed the following data within the electronic MEDICAL RECORD NUMBER Nursing notes reviewed and incorporated, Labs reviewed, EKG interpreted, Radiograph reviewed, Discussed with admitting physician Dr. Sheryle Hailiamond and Notes from prior ED visits     Dale Grammestis Wilbert Joneen BoersRankin Jr. was evaluated in Emergency Department on 12/08/2018 for the symptoms described in the history of present illness. He was evaluated in the context of the global COVID-19 pandemic, which necessitated consideration that the patient might be at risk for infection with the SARS-CoV-2 virus that causes COVID-19. Institutional protocols and algorithms that pertain to the evaluation of patients at risk for COVID-19 are in a state of rapid change based on information released by regulatory bodies including the CDC and federal and state organizations. These policies and algorithms were followed during the patient's care in the ED.   73 year old male with CAD status post CABG with chest pain and DOE. Differential diagnosis includes, but is not limited to, ACS, aortic dissection, pulmonary embolism, cardiac tamponade, pneumothorax, pneumonia, pericarditis, myocarditis, GI-related causes including esophagitis/gastritis, and musculoskeletal chest wall pain.    Symptoms concerning for unstable angina.  Will administer aspirin, nitroglycerin paste.  Anticipate hospitalization.  Clinical Course as of Dec 08 2334  Fri Dec 07, 2018  0241 Updated patient of all test results.  Will discuss with hospitalist Dr. Sheryle Hailiamond for admission.   [JS]    Clinical Course User Index [JS] Irean HongSung, Dearis Danis J, MD     ____________________________________________   FINAL CLINICAL IMPRESSION(S) / ED DIAGNOSES  Final diagnoses:  Unstable angina (HCC)  Chest pain, unspecified type  Essential hypertension     ED Discharge Orders          Ordered    amLODipine (NORVASC) 10 MG tablet  Daily     12/07/18 1308    isosorbide mononitrate (IMDUR) 30 MG 24 hr tablet  Daily     12/07/18 1356           Note:  This document was prepared using Dragon voice recognition software and may include unintentional dictation errors.   Irean HongSung, Daxton Nydam J, MD 12/08/18 [phone]

## 2018-12-08 LAB — HIV ANTIBODY (ROUTINE TESTING W REFLEX): HIV Screen 4th Generation wRfx: NONREACTIVE

## 2018-12-21 ENCOUNTER — Telehealth: Payer: Self-pay

## 2018-12-21 NOTE — Telephone Encounter (Signed)
    COVID-19 Pre-Screening Questions:  . In the past 7 to 10 days have you had a cough, shortness of breath, headache, congestion, fever (100 or greater), body aches, chills, sore throat, or sudden loss of taste or sense of smell? NO . Have you been around anyone with known Covid 19? NO . Have you been around anyone who is awaiting Covid 19 test results in the past 7 to 10 days? NO . Have you been around anyone who has been exposed to Covid 19, or has mentioned symptoms of Covid 19 within the past 7 to 10 days? NO  If you have any concerns/questions about symptoms patients report during screening (either on the phone or at threshold). Contact the provider seeing the patient or DOD for further guidance.  If neither are available contact a member of the leadership team.            

## 2018-12-25 ENCOUNTER — Encounter: Payer: Self-pay | Admitting: Nurse Practitioner

## 2018-12-25 ENCOUNTER — Other Ambulatory Visit: Payer: Self-pay

## 2018-12-25 ENCOUNTER — Ambulatory Visit (INDEPENDENT_AMBULATORY_CARE_PROVIDER_SITE_OTHER): Payer: Medicare HMO | Admitting: Nurse Practitioner

## 2018-12-25 VITALS — BP 154/84 | HR 76 | Ht 72.0 in | Wt 233.4 lb

## 2018-12-25 DIAGNOSIS — I10 Essential (primary) hypertension: Secondary | ICD-10-CM | POA: Diagnosis not present

## 2018-12-25 DIAGNOSIS — I2510 Atherosclerotic heart disease of native coronary artery without angina pectoris: Secondary | ICD-10-CM | POA: Diagnosis not present

## 2018-12-25 DIAGNOSIS — E785 Hyperlipidemia, unspecified: Secondary | ICD-10-CM | POA: Diagnosis not present

## 2018-12-25 MED ORDER — AMLODIPINE BESYLATE 10 MG PO TABS: 10.0000 mg | ORAL_TABLET | Freq: Every day | ORAL | 2 refills | Status: DC

## 2018-12-25 MED ORDER — ISOSORBIDE MONONITRATE ER 60 MG PO TB24: 60.0000 mg | ORAL_TABLET | Freq: Every day | ORAL | 3 refills | Status: DC

## 2018-12-25 NOTE — Progress Notes (Signed)
Office Visit    Patient Name: Dale DollarOtis Wilbert Taber Jr. Date of Encounter: 12/25/2018  Primary Care Provider:  Jaclyn Shaggyate, Denny C, MD Primary Cardiologist:  Lorine BearsMuhammad Arida, MD  Chief Complaint    73 y/o ? with a history of hypertension, hyperlipidemia, and CAD status post four-vessel bypass in July 2018, who presents for follow-up after recent hospitalization and nonischemic stress testing.  Past Medical History    Past Medical History:  Diagnosis Date  . Arthritis    "hands, knees" (01/09/2017)  . CAD (coronary artery disease)    a. 12/2016 Cath: LM nl, LAD 95p, 717m, 60/90d, LCX 80p/m, 18349m, OM1 377m, OM2 90, RCA 99p, Ef 55-65%; b. 12/2016 CABG x 4 (LIMA->mLAD, VG->dLAD, VG->OM1->OM2); c. 11/2018 MV: EF 66%, no ischemia.  . Essential hypertension   . High cholesterol   . History of echocardiogram    a. 12/2016 Echo: EF 65-70%.   Past Surgical History:  Procedure Laterality Date  . CARDIAC CATHETERIZATION  01/09/2017   "scheduling me for OHS"  . COLONOSCOPY WITH PROPOFOL N/A 11/09/2016   Procedure: COLONOSCOPY WITH PROPOFOL;  Surgeon: Earline MayotteByrnett, Jeffrey W, MD;  Location: [facility] ENDOSCOPY;  Service: Endoscopy;  Laterality: N/A;  . CORONARY ARTERY BYPASS GRAFT N/A 01/12/2017   CABG x 4: LIMA to mLAD, SVG to dLAD, sequential SVG to OM1-OM2, EVH via left thigh and leg;  Surgeon: Purcell Nailswen, Clarence H, MD;  [facility];  Service: Open Heart Surgery;  Laterality: N/A;  . INTRAOPERATIVE TRANSESOPHAGEAL ECHOCARDIOGRAM N/A 01/12/2017   Procedure: INTRAOPERATIVE TRANSESOPHAGEAL ECHOCARDIOGRAM;  Surgeon: Purcell Nailswen, Clarence H, MD;  [facility];  Service: Open Heart Surgery;  Laterality: N/A;  . LEFT HEART CATH AND CORONARY ANGIOGRAPHY Left 01/09/2017   Procedure: Left Heart Cath and Coronary Angiography;  Surgeon: Iran OuchArida, Muhammad A, MD;  Location: [facility] INVASIVE CV LAB;  Service: Cardiovascular;  Laterality: Left;    Allergies  No Known Allergies  History of Present Illness    73 year old male with  the above past medical history including hypertension, hyperlipidemia, and CAD status post catheterization July 2018 revealing multivessel disease.  That was followed by coronary artery bypass grafting x4.  He was seen via telemedicine visit by Dr. Kirke CorinArida on May 5 at which time, he was doing well.  Unfortunately, he was admitted to [facility] in early June secondary to chest pain and hypertensive urgency.  Blood pressures were over 200 systolic on arrival.  ECG was stable.  He ruled out for MI and we saw him in consultation.  Stress testing was undertaken and was nonischemic.  He was placed on amlodipine and isosorbide therapy in addition to his carvedilol and ACE inhibitor.  He was subsequently discharged home.  Since discharge, he has done well.  He has not had any recurrence of chest pain his blood pressure has been running mostly around 140-150.  He denies dyspnea, palpitations, PND, orthopnea, dizziness, syncope, edema, or early satiety.  Home Medications    Prior to Admission medications   Medication Sig Start Date End Date Taking? Authorizing Provider  amLODipine (NORVASC) 10 MG tablet Take 1 tablet (10 mg total) by mouth daily. 12/08/18   Katha HammingKonidena, Snehalatha, MD  aspirin 325 MG EC tablet Take 1 tablet (325 mg total) by mouth daily. 02/28/17   Antonieta IbaGollan, Timothy J, MD  atorvastatin (LIPITOR) 40 MG tablet Take 40 mg by mouth daily.    [provider]  carvedilol (COREG) 25 MG tablet Take 25 mg by mouth 2 (two) times a day.  [provider]  cetirizine (ZYRTEC) 10 MG tablet Take 10 mg by mouth daily.    [provider]  isosorbide mononitrate (IMDUR) 30 MG 24 hr tablet Take 1 tablet (30 mg total) by mouth daily. 12/07/18   Epifanio Lesches, MD  meloxicam (MOBIC) 15 MG tablet Take 15 mg by mouth daily. 10/05/18   [provider]  moexipril (UNIVASC) 15 MG tablet Take 15 mg by mouth at bedtime.  10/24/18   [provider]  pantoprazole (PROTONIX) 40  MG tablet Take 1 tablet (40 mg total) by mouth daily. 02/20/18   Wellington Hampshire, MD    Review of Systems    Feeling much better. He denies chest pain, palpitations, dyspnea, pnd, orthopnea, n, v, dizziness, syncope, edema, weight gain, or early satiety.  All other systems reviewed and are otherwise negative except as noted above.  Physical Exam    VS:  BP (!) 154/84 (BP Location: Left Arm, Patient Position: Sitting, Cuff Size: Normal)   Pulse 76   Ht 6' (1.829 m)   Wt 233 lb 6.4 oz (105.9 kg)   BMI 31.65 kg/m  , BMI Body mass index is 31.65 kg/m. GEN: Well nourished, well developed, in no acute distress. HEENT: normal. Neck: Supple, no JVD, carotid bruits, or masses. Cardiac: RRR, no murmurs, rubs, or gallops. No clubbing, cyanosis, edema.  Radials/PT 2+ and equal bilaterally.  Respiratory:  Respirations regular and unlabored, clear to auscultation bilaterally. GI: Soft, nontender, nondistended, BS + x 4. MS: no deformity or atrophy. Skin: warm and dry, no rash. Neuro:  Strength and sensation are intact. Psych: Normal affect.  Accessory Clinical Findings    Lab Results  Component Value Date   WBC 8.3 12/06/2018   HGB 14.7 12/06/2018   HCT 45.2 12/06/2018   MCV 84.0 12/06/2018   PLT 150 12/06/2018   Lab Results  Component Value Date   CREATININE 1.24 12/06/2018   BUN 16 12/06/2018   NA 138 12/06/2018   K 3.5 12/06/2018   CL 109 12/06/2018   CO2 23 12/06/2018    Lab Results  Component Value Date   LDLCALC 67 03/17/2017     Assessment & Plan    1.  CAD:  S/p 4 vessel CABG x4 in July 2018.  Recent admission for hypertensive urgency and chest pain.  He ruled out.  Stress testing was nonischemic.  He has done well since discharge with improved blood pressure control.  He remains on aspirin, statin, beta-blocker, and nitrate therapy.  In the setting of ongoing hypertension, I am going to titrate his isosorbide to 60 mg daily.  I advised that he may reduce his aspirin  dose to 81 mg daily.  2.  Essential hypertension: Blood pressure improved since hospitalization.  Running in the 140s to 150s.  I will titrate his isosorbide mononitrate to 60 mg today.  He understands that if this causes headaches, he can drop back to 30 mg.  As he is on maximum doses of carvedilol, amlodipine, and moexipril, we may need to consider hydralazine as a next agent.  3.  Hyperlipidemia: On statin therapy.He is due for follow-up lipids.  He is not fasting today.  He is unsure as to whether or not his primary care is following this.  4.  Disposition: Follow-up with Dr. Fletcher Anon in 3 months or sooner if necessary.   Murray Hodgkins, NP 12/25/2018, 5:20 PM

## 2018-12-25 NOTE — Patient Instructions (Addendum)
Medication Instructions:  Your physician has recommended you make the following change in your medication:  1- INCREASE Imdur 60 mg by mouth once a day.  If you need a refill on your cardiac medications before your next appointment, please call your pharmacy.   Lab work: - None ordered.  If you have labs (blood work) drawn today and your tests are completely normal, you will receive your results only by: Marland Kitchen MyChart Message (if you have MyChart) OR . A paper copy in the mail If you have any lab test that is abnormal or we need to change your treatment, we will call you to review the results.  Testing/Procedures: - None ordered.   Follow-Up: At [facility], you and your health needs are our priority.  As part of our continuing mission to provide you with exceptional heart care, we have created designated Provider Care Teams.  These Care Teams include your primary Cardiologist (physician) and Advanced Practice Providers (APPs -  Physician Assistants and Nurse Practitioners) who all work together to provide you with the care you need, when you need it. You will need a follow up appointment in 3 months.  Please call our office 2 months in advance to schedule this appointment.  You may see Kathlyn Sacramento, MD or one of the following Advanced Practice Providers on your designated Care Team:   Murray Hodgkins, NP

## 2018-12-27 ENCOUNTER — Other Ambulatory Visit: Payer: Self-pay | Admitting: Cardiovascular Disease

## 2018-12-27 NOTE — Telephone Encounter (Signed)
Please advise if ok to refill historical provider. 

## 2019-01-24 ENCOUNTER — Encounter: Payer: Self-pay | Admitting: General Surgery

## 2019-01-30 ENCOUNTER — Other Ambulatory Visit: Payer: Self-pay | Admitting: Cardiovascular Disease

## 2019-04-01 ENCOUNTER — Other Ambulatory Visit: Payer: Self-pay | Admitting: Cardiovascular Disease

## 2019-04-01 ENCOUNTER — Encounter: Payer: Self-pay | Admitting: Physician Assistant

## 2019-04-01 NOTE — Telephone Encounter (Signed)
Please schedule 3 mo F/U appointment with Dr. Aida Raider. Thank you!

## 2019-04-02 NOTE — Telephone Encounter (Signed)
Left v/m message requesting for patient to call back to schedule appointment with Dr. Fletcher Anon.

## 2019-04-17 NOTE — Progress Notes (Signed)
Cardiology Office Note    Date:  04/22/2019   ID:  Dale DollarOtis Wilbert Coin Jr., DOB 01/21/1946, MRN [id number]  PCP:  Jaclyn Shaggyate, Denny C, MD  Cardiologist:  Lorine BearsMuhammad Arida, MD  Electrophysiologist:  None   Chief Complaint: Follow up  History of Present Illness:   Dale DollarOtis Wilbert Langseth Jr. is a 73 y.o. male with history of CAD status post four-vessel bypass in 12/2016, HTN, and HLD who presents for follow-up of his CAD.  Patient previous underwent cath in 12/2016 revealing multivessel disease.  In that setting, he underwent four-vessel bypass with LIMA to LAD, SVG to distal LAD, sequential SVG to OM1 and OM2.  He was admitted to the hospital in 11/2018 with chest pain in the setting of hypertensive urgency with systolic BP greater than 200 mmHg upon arrival.  EKG was stable.  He ruled out for MI and underwent nuclear stress test which was nonischemic.  Patient was placed on amlodipine and Imdur in addition to his previously prescribed carvedilol and ACE inhibitor.  He was seen in follow-up on 12/25/2018 and was doing well without any recurrence of chest pain with BP running in the 140-150 systolic range.  With ongoing hypertension, his Imdur was titrated to 60 mg daily.  He was advised to reduce aspirin to 81 mg daily.  Patient comes in doing very well today.  He has not had any further chest pain, shortness of breath, palpitations, dizziness, presyncope, syncope.  He does note some trace lower extremity swelling that is typically worse as the day progresses and improved first thing in the morning.  No abdominal distention, orthopnea, PND, early satiety.  No falls, BRBPR, or melena.  He has not yet transitioned from full dose aspirin to aspirin 81 mg daily given he had just bought a large bottle of aspirin prior to his last appointment.  He does plan to transition to 81 mg daily aspirin when he is finished with his current bottle, which will be soon.  Blood pressure at home typically runs between 125 and 130  systolic.   Labs: 03/2019 - BUN 14, serum creatinine 1.3, potassium 4.6, albumin 3.9, AST/ALT normal, total cholesterol 106, triglycerides 63, HDL 37, LDL 53 11/2018 - TSH normal, Hgb 14.7, PLT 150   Past Medical History:  Diagnosis Date  . Arthritis    "hands, knees" (01/09/2017)  . CAD (coronary artery disease)    a. 12/2016 Cath: LM nl, LAD 95p, 80101m, 60/90d, LCX 80p/m, 12452m, OM1 31101m, OM2 90, RCA 99p, Ef 55-65%; b. 12/2016 CABG x 4 (LIMA->mLAD, VG->dLAD, VG->OM1->OM2); c. 11/2018 MV: EF 66%, no ischemia.  . Essential hypertension   . High cholesterol   . History of echocardiogram    a. 12/2016 Echo: EF 65-70%.    Past Surgical History:  Procedure Laterality Date  . CARDIAC CATHETERIZATION  01/09/2017   "scheduling me for OHS"  . COLONOSCOPY WITH PROPOFOL N/A 11/09/2016   Procedure: COLONOSCOPY WITH PROPOFOL;  Surgeon: Earline MayotteByrnett, Jeffrey W, MD;  Location: [facility] ENDOSCOPY;  Service: Endoscopy;  Laterality: N/A;  . CORONARY ARTERY BYPASS GRAFT N/A 01/12/2017   CABG x 4: LIMA to mLAD, SVG to dLAD, sequential SVG to OM1-OM2, EVH via left thigh and leg;  Surgeon: Purcell Nailswen, Clarence H, MD;  [facility];  Service: Open Heart Surgery;  Laterality: N/A;  . INTRAOPERATIVE TRANSESOPHAGEAL ECHOCARDIOGRAM N/A 01/12/2017   Procedure: INTRAOPERATIVE TRANSESOPHAGEAL ECHOCARDIOGRAM;  Surgeon: Purcell Nailswen, Clarence H, MD;  [facility];  Service: Open Heart Surgery;  Laterality: N/A;  .  LEFT HEART CATH AND CORONARY ANGIOGRAPHY Left 01/09/2017   Procedure: Left Heart Cath and Coronary Angiography;  Surgeon: Iran Ouch, MD;  Location: [facility] INVASIVE CV LAB;  Service: Cardiovascular;  Laterality: Left;    Current Medications: Current Meds  Medication Sig  . amLODipine (NORVASC) 10 MG tablet Take 1 tablet (10 mg total) by mouth daily.  Marland Kitchen aspirin 325 MG EC tablet Take 1 tablet (325 mg total) by mouth daily.  Marland Kitchen atorvastatin (LIPITOR) 40 MG tablet Take 40 mg by mouth daily.  . carvedilol (COREG) 25 MG tablet  TAKE 1 TABLET TWICE DAILY  . cetirizine (ZYRTEC) 10 MG tablet Take 10 mg by mouth daily.  . isosorbide mononitrate (IMDUR) 60 MG 24 hr tablet Take 1 tablet (60 mg total) by mouth daily.  . meloxicam (MOBIC) 15 MG tablet Take 15 mg by mouth daily.  . moexipril (UNIVASC) 15 MG tablet Take 15 mg by mouth at bedtime.   . pantoprazole (PROTONIX) 40 MG tablet TAKE 1 TABLET EVERY DAY    Allergies:   Patient has no known allergies.   Social History   Socioeconomic History  . Marital status: Divorced    Spouse name: Not on file  . Number of children: Not on file  . Years of education: Not on file  . Highest education level: Not on file  Occupational History  . Not on file  Social Needs  . Financial resource strain: Not on file  . Food insecurity    Worry: Not on file    Inability: Not on file  . Transportation needs    Medical: Not on file    Non-medical: Not on file  Tobacco Use  . Smoking status: Never Smoker  . Smokeless tobacco: Never Used  Substance and Sexual Activity  . Alcohol use: No  . Drug use: No  . Sexual activity: Not Currently  Lifestyle  . Physical activity    Days per week: Not on file    Minutes per session: Not on file  . Stress: Not on file  Relationships  . Social Musician on phone: Not on file    Gets together: Not on file    Attends religious service: Not on file    Active member of club or organization: Not on file    Attends meetings of clubs or organizations: Not on file    Relationship status: Not on file  Other Topics Concern  . Not on file  Social History Narrative  . Not on file     Family History:  The patient's family history includes Heart attack in his mother.  ROS:   Review of Systems  Constitutional: Negative for chills, diaphoresis, fever, malaise/fatigue and weight loss.  HENT: Negative for congestion.   Eyes: Negative for discharge and redness.  Respiratory: Negative for cough, hemoptysis, sputum production,  shortness of breath and wheezing.   Cardiovascular: Negative for chest pain, palpitations, orthopnea, claudication, leg swelling and PND.  Gastrointestinal: Negative for abdominal pain, blood in stool, heartburn, melena, nausea and vomiting.  Genitourinary: Negative for hematuria.  Musculoskeletal: Negative for falls and myalgias.  Skin: Negative for rash.  Neurological: Negative for dizziness, tingling, tremors, sensory change, speech change, focal weakness, loss of consciousness and weakness.  Endo/Heme/Allergies: Does not bruise/bleed easily.  Psychiatric/Behavioral: Negative for substance abuse. The patient is not nervous/anxious.   All other systems reviewed and are negative.    EKGs/Labs/Other Studies Reviewed:    Studies reviewed were summarized above. The  additional studies were reviewed today: As above.   EKG:  EKG is ordered today.  The EKG ordered today demonstrates NSR, 68 bpm, inferior Q waves, no acute ST-T changes  Recent Labs: 12/06/2018: BUN 16; Creatinine, Ser 1.24; Hemoglobin 14.7; Platelets 150; Potassium 3.5; Sodium 138 12/07/2018: B Natriuretic Peptide 500.0; TSH 4.413  Recent Lipid Panel    Component Value Date/Time   CHOL 112 03/17/2017 0904   TRIG 70 03/17/2017 0904   HDL 31 (L) 03/17/2017 0904   CHOLHDL 3.6 03/17/2017 0904   VLDL 14 03/17/2017 0904   LDLCALC 67 03/17/2017 0904    PHYSICAL EXAM:    VS:  BP 140/80 (BP Location: Left Arm, Patient Position: Sitting, Cuff Size: Normal)   Pulse 68   Temp (!) 97.2 F (36.2 C)   Ht 6' (1.829 m)   Wt 238 lb 8 oz (108.2 kg)   BMI 32.35 kg/m   BMI: Body mass index is 32.35 kg/m.  Physical Exam  Constitutional: He is oriented to person, place, and time. He appears well-developed and well-nourished.  HENT:  Head: Normocephalic and atraumatic.  Eyes: Right eye exhibits no discharge. Left eye exhibits no discharge.  Neck: Normal range of motion. No JVD present.  Cardiovascular: Normal rate, regular rhythm,  S1 normal, S2 normal and normal heart sounds. Exam reveals no distant heart sounds, no friction rub, no midsystolic click and no opening snap.  No murmur heard. Pulses:      Posterior tibial pulses are 2+ on the right side and 2+ on the left side.  Pulmonary/Chest: Effort normal and breath sounds normal. No respiratory distress. He has no decreased breath sounds. He has no wheezes. He has no rales. He exhibits no tenderness.  Abdominal: Soft. He exhibits no distension. There is no abdominal tenderness.  Musculoskeletal:        General: Edema present.     Comments: Trace bilateral pretibial edema  Neurological: He is alert and oriented to person, place, and time.  Skin: Skin is warm and dry. No cyanosis. Nails show no clubbing.  Psychiatric: He has a normal mood and affect. His speech is normal and behavior is normal. Judgment and thought content normal.    Wt Readings from Last 3 Encounters:  04/22/19 238 lb 8 oz (108.2 kg)  12/25/18 233 lb 6.4 oz (105.9 kg)  12/07/18 228 lb 11.2 oz (103.7 kg)     ASSESSMENT & PLAN:   1. CAD status post four-vessel CABG in 12/2016 without angina: He is doing well without any symptoms concerning for chest pain.  Recent stress testing nonischemic as outlined above.  Continue aspirin, Lipitor, Coreg, and Imdur.  Patient will be transitioning from full dose aspirin to aspirin 81 mg daily.  No plans for further ischemic evaluation at this time.  2. HTN: Blood pressure remains mildly elevated today though is improved when compared to his most recent visit.  BP at home has been very well controlled.  Continue current regimen including carvedilol, amlodipine, minoxidil, and isosorbide mononitrate.  3. HLD: LDL of 53 from 03/2019 with normal liver function at that time.  Continue Lipitor.  Disposition: F/u with Dr. Kirke Corin or an APP in 6 months, sooner if needed.   Medication Adjustments/Labs and Tests Ordered: Current medicines are reviewed at length with the  patient today.  Concerns regarding medicines are outlined above. Medication changes, Labs and Tests ordered today are summarized above and listed in the Patient Instructions accessible in Encounters.   Signed, Eula Listen, PA-C  04/22/2019 3:04 PM     [address] [place], [place] [id number] [phone]

## 2019-04-22 ENCOUNTER — Other Ambulatory Visit: Payer: Self-pay

## 2019-04-22 ENCOUNTER — Encounter: Payer: Self-pay | Admitting: Physician Assistant

## 2019-04-22 ENCOUNTER — Ambulatory Visit (INDEPENDENT_AMBULATORY_CARE_PROVIDER_SITE_OTHER): Payer: Medicare HMO | Admitting: Physician Assistant

## 2019-04-22 VITALS — BP 140/80 | HR 68 | Temp 97.2°F | Ht 72.0 in | Wt 238.5 lb

## 2019-04-22 DIAGNOSIS — I10 Essential (primary) hypertension: Secondary | ICD-10-CM

## 2019-04-22 DIAGNOSIS — I2510 Atherosclerotic heart disease of native coronary artery without angina pectoris: Secondary | ICD-10-CM | POA: Diagnosis not present

## 2019-04-22 DIAGNOSIS — E785 Hyperlipidemia, unspecified: Secondary | ICD-10-CM

## 2019-04-22 MED ORDER — ASPIRIN EC 81 MG PO TBEC: 81.0000 mg | DELAYED_RELEASE_TABLET | Freq: Every day | ORAL | 3 refills | Status: DC

## 2019-04-22 NOTE — Addendum Note (Signed)
Addended by: Verlon Au on: 04/22/2019 03:41 PM   Modules accepted: Orders

## 2019-04-22 NOTE — Patient Instructions (Signed)
Medication Instructions:  1- DECREASE Aspirin 1 tablet (81 mg total) once daily *If you need a refill on your cardiac medications before your next appointment, please call your pharmacy*  Lab Work: None ordered  If you have labs (blood work) drawn today and your tests are completely normal, you will receive your results only by: Marland Kitchen MyChart Message (if you have MyChart) OR . A paper copy in the mail If you have any lab test that is abnormal or we need to change your treatment, we will call you to review the results.  Testing/Procedures: None ordered   Follow-Up: At [facility], you and your health needs are our priority.  As part of our continuing mission to provide you with exceptional heart care, we have created designated Provider Care Teams.  These Care Teams include your primary Cardiologist (physician) and Advanced Practice Providers (APPs -  Physician Assistants and Nurse Practitioners) who all work together to provide you with the care you need, when you need it.  Your next appointment:   6 months  The format for your next appointment:   In Person  Provider:    You may see Kathlyn Sacramento, MD or one of the following Advanced Practice Providers on your designated Care Team:    Murray Hodgkins, NP  Christell Faith, PA-C  Marrianne Mood, PA-C

## 2019-04-29 ENCOUNTER — Other Ambulatory Visit: Payer: Self-pay | Admitting: Cardiovascular Disease

## 2019-06-28 ENCOUNTER — Other Ambulatory Visit: Payer: Self-pay | Admitting: Cardiovascular Disease

## 2019-07-15 ENCOUNTER — Other Ambulatory Visit: Payer: Self-pay

## 2019-07-15 MED ORDER — AMLODIPINE BESYLATE 10 MG PO TABS: 10.0000 mg | ORAL_TABLET | Freq: Every day | ORAL | 3 refills | Status: DC

## 2019-08-31 ENCOUNTER — Other Ambulatory Visit: Payer: Self-pay | Admitting: Cardiovascular Disease

## 2019-09-18 ENCOUNTER — Telehealth: Payer: Self-pay

## 2019-09-18 NOTE — Telephone Encounter (Signed)
Patient due for a 3 year Colonoscopy follow up. He would like to return to Dr Lemar Livings for this. Patient given his office contact information and will call them .

## 2019-10-16 ENCOUNTER — Other Ambulatory Visit: Payer: Self-pay | Admitting: Cardiovascular Disease

## 2019-10-17 NOTE — Progress Notes (Signed)
Cardiology Office Note    Date:  10/21/2019   ID:  Dale Mccaughey., DOB 1945/12/14, MRN [id number]  PCP:  Albina Billet, MD  Cardiologist:  Kathlyn Sacramento, MD  Electrophysiologist:  None   Chief Complaint: Follow up  History of Present Illness:   Dale Hidalgo. is a 74 y.o. male with history of CAD status post four-vessel bypass in 12/2016, HTN, and HLD who presents for follow-up of his CAD.  He previous underwent cath in 12/2016 revealing multivessel disease.  In that setting, he underwent four-vessel bypass with LIMA to LAD, SVG to distal LAD, sequential SVG to OM1 and OM2.    Preoperative surface echo showed an EF of 65 to 70%.  He was admitted to the hospital in 11/2018 with chest pain in the setting of hypertensive urgency with systolic BP greater than 826 mmHg upon arrival.  EKG was stable.  He ruled out for MI and underwent nuclear stress test which was nonischemic.  He was placed on amlodipine and Imdur in addition to his previously prescribed carvedilol and ACE inhibitor.  He was seen in follow-up on 12/25/2018 and was doing well without any recurrence of chest pain with BP running in the 415-830 systolic range.  With ongoing hypertension, his Imdur was titrated to 60 mg daily.  He was advised to reduce aspirin to 81 mg daily.  I last saw him in 03/2019 for follow up, at which time he was doing well with BP running in the 940H to 680 systolic.  No changes were made.   He comes in doing very well from a cardiac perspective.  No chest pain, shortness of breath, palpitations, dizziness,, presyncope, syncope, lower extremity swelling, abdominal distention, orthopnea, PND, or early satiety.  No falls, hematochezia, melena, hemoptysis, or hematuria.  He is tolerating all medications without issues.  He has had some intermittent bilateral knee swelling which has been attributed to arthritis.  Otherwise, he does not have any issues or concerns.  He continues to work and remains  active.   Labs independently reviewed: 03/2019 - BUN 14, SCr 1.3, potassium 4.6, albumin 3.9, AST/ALT normal, TC 106, TG 63, HDL 37, LDL 55 11/2018 - HGB 14.7, PLT 150  Past Medical History:  Diagnosis Date  . Arthritis    "hands, knees" (01/09/2017)  . CAD (coronary artery disease)    a. 12/2016 Cath: LM nl, LAD 95p, 43m 60/90d, LCX 80p/m, 1086mOM1 6056mM2 90, RCA 99p, Ef 55-65%; b. 12/2016 CABG x 4 (LIMA->mLAD, VG->dLAD, VG->OM1->OM2); c. 11/2018 MV: EF 66%, no ischemia.  . Essential hypertension   . High cholesterol   . History of echocardiogram    a. 12/2016 Echo: EF 65-70%.    Past Surgical History:  Procedure Laterality Date  . CARDIAC CATHETERIZATION  01/09/2017   "scheduling me for OHS"  . COLONOSCOPY WITH PROPOFOL N/A 11/09/2016   Procedure: COLONOSCOPY WITH PROPOFOL;  Surgeon: ByrRobert BellowD;  Location: [facility];  Service: Endoscopy;  Laterality: N/A;  . CORONARY ARTERY BYPASS GRAFT N/A 01/12/2017   CABG x 4: LIMA to mLAD, SVG to dLAD, sequential SVG to OM1-OM2, EVH via left thigh and leg;  Surgeon: OweRexene AlbertsD;  [facility];  Laterality: N/A;  . INTRAOPERATIVE TRANSESOPHAGEAL ECHOCARDIOGRAM N/A 01/12/2017   Procedure: INTRAOPERATIVE TRANSESOPHAGEAL ECHOCARDIOGRAM;  Surgeon: OweRexene AlbertsD;  [facility] [place];  Laterality: N/A;  . LEFT HEART CATH  AND CORONARY ANGIOGRAPHY Left 01/09/2017   Procedure: Left Heart Cath and Coronary Angiography;  Surgeon: Wellington Hampshire, MD;  Location: [place] [facility];  Service: Cardiovascular;  Laterality: Left;    Current Medications: Current Meds  Medication Sig  . amLODipine (NORVASC) 10 MG tablet Take 1 tablet (10 mg total) by mouth daily.  Marland Kitchen aspirin EC 81 MG tablet Take 1 tablet (81 mg total) by mouth daily.  Marland Kitchen atorvastatin (LIPITOR) 40 MG tablet Take 40 mg by mouth daily.  . carvedilol (COREG) 25 MG tablet TAKE 1 TABLET TWICE DAILY  .  cetirizine (ZYRTEC) 10 MG tablet Take 10 mg by mouth daily.  . isosorbide mononitrate (IMDUR) 60 MG 24 hr tablet Take 1 tablet (60 mg total) by mouth daily.  . meloxicam (MOBIC) 15 MG tablet Take 15 mg by mouth daily.  . moexipril (UNIVASC) 15 MG tablet Take 15 mg by mouth at bedtime.   . pantoprazole (PROTONIX) 40 MG tablet TAKE 1 TABLET EVERY DAY    Allergies:   Patient has no known allergies.   Social History   Socioeconomic History  . Marital status: Divorced    Spouse name: Not on file  . Number of children: Not on file  . Years of education: Not on file  . Highest education level: Not on file  Occupational History  . Not on file  Tobacco Use  . Smoking status: Never Smoker  . Smokeless tobacco: Never Used  Substance and Sexual Activity  . Alcohol use: No  . Drug use: No  . Sexual activity: Not Currently  Other Topics Concern  . Not on file  Social History Narrative  . Not on file   Social Determinants of Health   Financial Resource Strain:   . Difficulty of Paying Living Expenses:   Food Insecurity:   . Worried About Charity fundraiser in the Last Year:   . Arboriculturist in the Last Year:   Transportation Needs:   . Film/video editor (Medical):   Marland Kitchen Lack of Transportation (Non-Medical):   Physical Activity:   . Days of Exercise per Week:   . Minutes of Exercise per Session:   Stress:   . Feeling of Stress :   Social Connections:   . Frequency of Communication with Friends and Family:   . Frequency of Social Gatherings with Friends and Family:   . Attends Religious Services:   . Active Member of Clubs or Organizations:   . Attends Archivist Meetings:   Marland Kitchen Marital Status:      Family History:  The patient's family history includes Heart attack in his mother.  ROS:   Review of Systems  Constitutional: Negative for chills, diaphoresis, fever, malaise/fatigue and weight loss.  HENT: Negative for congestion.   Eyes: Negative for discharge  and redness.  Respiratory: Negative for cough, hemoptysis, sputum production, shortness of breath and wheezing.   Cardiovascular: Negative for chest pain, palpitations, orthopnea, claudication, leg swelling and PND.  Gastrointestinal: Negative for abdominal pain, blood in stool, heartburn, melena, nausea and vomiting.  Genitourinary: Negative for hematuria.  Musculoskeletal: Negative for falls and myalgias.  Skin: Negative for rash.  Neurological: Negative for dizziness, tingling, tremors, sensory change, speech change, focal weakness, loss of consciousness and weakness.  Endo/Heme/Allergies: Does not bruise/bleed easily.  Psychiatric/Behavioral: Negative for substance abuse. The patient is not nervous/anxious.   All other systems reviewed and are negative.    EKGs/Labs/Other Studies Reviewed:    Studies  reviewed were summarized above. The additional studies were reviewed today:  LHC 12/2016:  Prox Cx to Mid Cx lesion, 80 %stenosed.  Ost 1st Mrg to 1st Mrg lesion, 60 %stenosed.  Mid Cx lesion, 100 %stenosed.  Ost LAD to Prox LAD lesion, 95 %stenosed.  Mid LAD lesion, 60 %stenosed.  Dist LAD-2 lesion, 60 %stenosed.  Dist LAD-1 lesion, 90 %stenosed.  Ost 2nd Mrg to 2nd Mrg lesion, 90 %stenosed.  Prox RCA to Mid RCA lesion, 99 %stenosed.  The left ventricular systolic function is normal.  LV end diastolic pressure is normal.  The left ventricular ejection fraction is 55-65% by visual estimate.   1. 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 2. Normal LV systolic function and high normal left ventricular end-diastolic pressure.  Recommendations: Overall difficult revascularization options given that the diffuse disease. PCI will require long segments of stenting especially the LAD. Also the left circumflex disease is a bifurcation stenosis involving 2 large OM branches. CABG is limited by significant  distal LAD disease. Given severity of disease, the patient will need to be hospitalized and started on a heparin drip 8 hours after sheath pull. I switched diltiazem to metoprolol. Continue atorvastatin. Transferr to [facility] for a heart team decision and consultation with CVTS. It might be possible to place a graft on both mid and distal LAD. __________  2D echo 12/2016: - Left ventricle: The cavity size was normal. Wall thickness was  normal. Systolic function was vigorous. The estimated ejection  fraction was in the range of 65% to 70%.  - Mitral valve: Calcified annulus. Mildly thickened leaflets . __________  Intraoperative TEE 12/2016: - Left ventricle: Hypertrophy was noted. Systolic function was  normal. The estimated ejection fraction was in the range of 50%  to 55%.  - Aortic valve: There was trivial regurgitation.  - Mitral valve: Mildly calcified annulus. Mildly thickened leaflets  .  - Left atrium: No evidence of thrombus in the atrial cavity or  appendage. No evidence of thrombus in the appendage.  - Right atrium: No evidence of thrombus in the atrial cavity or  appendage.  - Atrial septum: No defect or patent foramen ovale was identified.  - Tricuspid valve: No evidence of vegetation.   Impressions:   - No change from pre-bypass images. Post-bypass Impression:    1. Aortic Valve: Trace AI, unchanged from pre-bypass    2. Mitral Valve: Moderate MAC, trace MR, unchanged from  pre-bypass.    3. LV: EF 5--55% no regional wall motion abnormalities. No  intra-cardiac air noted.    4. RV: Normal size, normal RV systolic function    5. Tricuspid valve: normal appearing leaflets, trace TR.   EKG:  EKG is ordered today.  The EKG ordered today demonstrates NSR, 65 bpm, inferior Q waves, nonspecific lateral ST-T changes which are slightly more pronounced when compared to prior study  Recent Labs: 12/06/2018: BUN 16; Creatinine, Ser 1.24;  Hemoglobin 14.7; Platelets 150; Potassium 3.5; Sodium 138 12/07/2018: B Natriuretic Peptide 500.0; TSH 4.413  Recent Lipid Panel    Component Value Date/Time   CHOL 112 03/17/2017 0904   TRIG 70 03/17/2017 0904   HDL 31 (L) 03/17/2017 0904   CHOLHDL 3.6 03/17/2017 0904   VLDL 14 03/17/2017 0904   LDLCALC 67 03/17/2017 0904    PHYSICAL EXAM:    VS:  BP 138/72 (BP Location: Left Arm, Patient Position: Sitting, Cuff Size: Normal)   Pulse 65  Ht 6' (1.829 m)   Wt 239 lb 4 oz (108.5 kg)   SpO2 96%   BMI 32.45 kg/m   BMI: Body mass index is 32.45 kg/m.  Physical Exam  Constitutional: He is oriented to person, place, and time. He appears well-developed and well-nourished.  HENT:  Head: Normocephalic and atraumatic.  Eyes: Right eye exhibits no discharge. Left eye exhibits no discharge.  Neck: No JVD present.  Cardiovascular: Normal rate, regular rhythm, S1 normal, S2 normal and normal heart sounds. Exam reveals no distant heart sounds, no friction rub, no midsystolic click and no opening snap.  No murmur heard. Pulses:      Posterior tibial pulses are 2+ on the right side and 2+ on the left side.  Pulmonary/Chest: Effort normal and breath sounds normal. No respiratory distress. He has no decreased breath sounds. He has no wheezes. He has no rales. He exhibits no tenderness.  Abdominal: Soft. He exhibits no distension. There is no abdominal tenderness.  Musculoskeletal:        General: No edema.     Cervical back: Normal range of motion.  Neurological: He is alert and oriented to person, place, and time.  Skin: Skin is warm and dry. No cyanosis. Nails show no clubbing.  Psychiatric: He has a normal mood and affect. His speech is normal and behavior is normal. Judgment and thought content normal.    Wt Readings from Last 3 Encounters:  10/21/19 239 lb 4 oz (108.5 kg)  04/22/19 238 lb 8 oz (108.2 kg)  12/25/18 233 lb 6.4 oz (105.9 kg)     ASSESSMENT & PLAN:   1. CAD status  post four-vessel CABG in 12/2016 without angina: He continues to do well without any symptoms concerning for angina.  Continue secondary prevention with aspirin, Coreg, Imdur, and Lipitor.  No plans for ischemic evaluation at this time.  He reports blood work was recently drawn at his PCPs office earlier this month and was normal.  2. HTN: Blood pressure is reasonably controlled today.  Continue current therapy including amlodipine, carvedilol, and isosorbide.  Low-sodium diet recommended.  3. HLD: LDL of 55 from 03/2019 with normal liver function at that time.  Continue Lipitor.  Plan for fasting labs at next visit.  Disposition: F/u with Dr. Fletcher Anon or an APP in 6 months, sooner if needed.   Medication Adjustments/Labs and Tests Ordered: Current medicines are reviewed at length with the patient today.  Concerns regarding medicines are outlined above. Medication changes, Labs and Tests ordered today are summarized above and listed in the Patient Instructions accessible in Encounters.   Signed, Christell Faith, PA-C 10/21/2019 1:24 PM     [address] [place], [place] [address] [phone]

## 2019-10-21 ENCOUNTER — Encounter: Payer: Self-pay | Admitting: Physician Assistant

## 2019-10-21 ENCOUNTER — Other Ambulatory Visit: Payer: Self-pay

## 2019-10-21 ENCOUNTER — Ambulatory Visit: Payer: Medicare HMO | Admitting: Physician Assistant

## 2019-10-21 VITALS — BP 138/72 | HR 65 | Ht 72.0 in | Wt 239.2 lb

## 2019-10-21 DIAGNOSIS — I2510 Atherosclerotic heart disease of native coronary artery without angina pectoris: Secondary | ICD-10-CM

## 2019-10-21 DIAGNOSIS — E785 Hyperlipidemia, unspecified: Secondary | ICD-10-CM | POA: Diagnosis not present

## 2019-10-21 DIAGNOSIS — I10 Essential (primary) hypertension: Secondary | ICD-10-CM | POA: Diagnosis not present

## 2019-10-21 NOTE — Patient Instructions (Signed)
Medication Instructions:  Your physician recommends that you continue on your current medications as directed. Please refer to the Current Medication list given to you today.  *If you need a refill on your cardiac medications before your next appointment, please call your pharmacy*   Lab Work: None ordered  If you have labs (blood work) drawn today and your tests are completely normal, you will receive your results only by: . MyChart Message (if you have MyChart) OR . A paper copy in the mail If you have any lab test that is abnormal or we need to change your treatment, we will call you to review the results.   Testing/Procedures: None ordered    Follow-Up: At [facility], you and your health needs are our priority.  As part of our continuing mission to provide you with exceptional heart care, we have created designated Provider Care Teams.  These Care Teams include your primary Cardiologist (physician) and Advanced Practice Providers (APPs -  Physician Assistants and Nurse Practitioners) who all work together to provide you with the care you need, when you need it.  We recommend signing up for the patient portal called "MyChart".  Sign up information is provided on this After Visit Summary.  MyChart is used to connect with patients for Virtual Visits (Telemedicine).  Patients are able to view lab/test results, encounter notes, upcoming appointments, etc.  Non-urgent messages can be sent to your provider as well.   To learn more about what you can do with MyChart, go to [email or web address].    Your next appointment:   6 month(s)  The format for your next appointment:   In Person  Provider:    You may see Muhammad Arida, MD or Ryan Dunn, PA-C.  

## 2019-10-25 ENCOUNTER — Encounter: Payer: Self-pay | Admitting: Emergency Medicine

## 2019-10-25 ENCOUNTER — Emergency Department
Admission: EM | Admit: 2019-10-25 | Discharge: 2019-10-25 | Disposition: A | Discharge status: Home / Self Care | Payer: Medicare HMO | Attending: Emergency Medicine | Admitting: Emergency Medicine

## 2019-10-25 ENCOUNTER — Other Ambulatory Visit: Payer: Self-pay

## 2019-10-25 DIAGNOSIS — I25119 Atherosclerotic heart disease of native coronary artery with unspecified angina pectoris: Secondary | ICD-10-CM | POA: Insufficient documentation

## 2019-10-25 DIAGNOSIS — Z79899 Other long term (current) drug therapy: Secondary | ICD-10-CM | POA: Insufficient documentation

## 2019-10-25 DIAGNOSIS — I10 Essential (primary) hypertension: Secondary | ICD-10-CM | POA: Diagnosis not present

## 2019-10-25 DIAGNOSIS — J0100 Acute maxillary sinusitis, unspecified: Secondary | ICD-10-CM | POA: Insufficient documentation

## 2019-10-25 DIAGNOSIS — Z951 Presence of aortocoronary bypass graft: Secondary | ICD-10-CM | POA: Diagnosis not present

## 2019-10-25 DIAGNOSIS — Z7982 Long term (current) use of aspirin: Secondary | ICD-10-CM | POA: Insufficient documentation

## 2019-10-25 DIAGNOSIS — R0981 Nasal congestion: Secondary | ICD-10-CM | POA: Diagnosis present

## 2019-10-25 MED ORDER — AMOXICILLIN-POT CLAVULANATE 875-125 MG PO TABS: 1.0000 | ORAL_TABLET | Freq: Two times a day (BID) | ORAL | 0 refills | Status: AC

## 2019-10-25 MED ORDER — FLUTICASONE PROPIONATE 50 MCG/ACT NA SUSP: 2.0000 | Freq: Every day | NASAL | 0 refills | Status: DC

## 2019-10-25 NOTE — Discharge Instructions (Signed)
Take the antibiotic as directed. Follow-up with Dr. Arlana Pouch for ongoing symptoms.

## 2019-10-25 NOTE — ED Triage Notes (Signed)
Pt presents to ED c/o sinus congestion and pressure x2 wks.

## 2019-10-25 NOTE — ED Notes (Signed)
See triage note  Presents with nasal congestion and sinus pressure  States sx's started couple of days ago  Denies any fever

## 2019-10-26 NOTE — ED Provider Notes (Signed)
[facility] Emergency Department Provider Note ____________________________________________  Time seen: 1701  I have reviewed the triage vital signs and the nursing notes.  HISTORY  Chief Complaint  Nasal Congestion  HPI Dale Cook. is a 74 y.o. male presents himself to the ED for evaluation of a 2-week complaint of intermittent sinus congestion and purulent nasal drainage.  Patient reports some sinus pressure but denies any significant cough or chest pain.  He also reports some facial pressure but denies any nausea, vomiting, or dizziness.  Patient is taking daily Zyrtec denies any other symptom relief at this time.   Past Medical History:  Diagnosis Date  . Arthritis    "hands, knees" (01/09/2017)  . CAD (coronary artery disease)    a. 12/2016 Cath: LM nl, LAD 95p, 18m, 60/90d, LCX 80p/m, 169m, OM1 56m, OM2 90, RCA 99p, Ef 55-65%; b. 12/2016 CABG x 4 (LIMA->mLAD, VG->dLAD, VG->OM1->OM2); c. 11/2018 MV: EF 66%, no ischemia.  . Essential hypertension   . High cholesterol   . History of echocardiogram    a. 12/2016 Echo: EF 65-70%.    Patient Active Problem List   Diagnosis Date Noted  . Chest pain 12/07/2018  . Hyperlipidemia with target low density lipoprotein (LDL) cholesterol less than 70 mg/dL 01/16/2017  . 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 Coronary artery disease involving native coronary artery with angina pectoris ([place]) 01/09/2017  . Unstable angina ([place])   . Essential hypertension   . Encounter for screening colonoscopy 09/21/2016    Past Surgical History:  Procedure Laterality Date  . CARDIAC CATHETERIZATION  01/09/2017   "scheduling me for OHS"  . COLONOSCOPY WITH PROPOFOL N/A 11/09/2016   Procedure: COLONOSCOPY WITH PROPOFOL;  Surgeon: Robert Bellow, MD;  Location: [facility]OS[facility];  Service: Endoscopy;  Laterality: N/A;  . CORONARY ARTERY BYPASS GRAFT N/A 01/12/2017   CABG x 4: LIMA to mLAD, SVG to dLAD, sequential SVG to OM1-OM2, EVH  via left thigh and leg;  Surgeon: Rexene Alberts, MD;  Location: [place];  Service: Open Heart Surgery;  Laterality: N/A;  . INTRAOPERATIVE TRANSESOPHAGEAL ECHOCARDIOGRAM N/A 01/12/2017   Procedure: INTRAOPERATIVE TRANSESOPHAGEAL ECHOCARDIOGRAM;  Surgeon: Rexene Alberts, MD;  Location: [place];  Service: Open Heart Surgery;  Laterality: N/A;  . LEFT HEART CATH AND CORONARY ANGIOGRAPHY Left 01/09/2017   Procedure: Left Heart Cath and Coronary Angiography;  Surgeon: Wellington Hampshire, MD;  Location: [place] [facility];  Service: Cardiovascular;  Laterality: Left;    Prior to Admission medications   Medication Sig Start Date End Date Taking? Authorizing Provider  amLODipine (NORVASC) 10 MG tablet Take 1 tablet (10 mg total) by mouth daily. 07/15/19   Theora Gianotti, NP  amoxicillin-clavulanate (AUGMENTIN) 875-125 MG tablet Take 1 tablet by mouth 2 (two) times daily for 10 days. 10/25/19 11/04/19  Kalii Chesmore, Dannielle Karvonen, PA-C  aspirin EC 81 MG tablet Take 1 tablet (81 mg total) by mouth daily. 04/22/19   Dunn, Areta Haber, PA-C  atorvastatin (LIPITOR) 40 MG tablet Take 40 mg by mouth daily.    [provider]  carvedilol (COREG) 25 MG tablet TAKE 1 TABLET TWICE DAILY 10/17/19   Theora Gianotti, NP  cetirizine (ZYRTEC) 10 MG tablet Take 10 mg by mouth daily.    [provider]  fluticasone (FLONASE) 50 MCG/ACT nasal spray Place 2 sprays into both nostrils daily. 10/25/19   Simmone Cape, Dannielle Karvonen, PA-C  isosorbide mononitrate (IMDUR) 60 MG 24 hr tablet  Take 1 tablet (60 mg total) by mouth daily. 12/25/18 10/21/19  Creig Hines, NP  meloxicam (MOBIC) 15 MG tablet Take 15 mg by mouth daily. 10/05/18   [provider]  moexipril (UNIVASC) 15 MG tablet Take 15 mg by mouth at bedtime.  10/24/18   [provider]  pantoprazole (PROTONIX) 40 MG tablet TAKE 1 TABLET EVERY DAY 09/02/19   Iran Ouch, MD    Allergies Patient has no known  allergies.  Family History  Problem Relation Age of Onset  . Heart attack Mother     Social History Social History   Tobacco Use  . Smoking status: Never Smoker  . Smokeless tobacco: Never Used  Substance Use Topics  . Alcohol use: No  . Drug use: No    Review of Systems  Constitutional: Negative for fever. Eyes: Negative for visual changes. ENT: Negative for sore throat.  Sinus congestion as above. Cardiovascular: Negative for chest pain. Respiratory: Negative for shortness of breath. Musculoskeletal: Negative for back pain. Skin: Negative for rash. Neurological: Negative for headaches, focal weakness or numbness. ____________________________________________  PHYSICAL EXAM:  VITAL SIGNS: ED Triage Vitals  Enc Vitals Group     BP 10/25/19 1637 (!) 147/73     Pulse Rate 10/25/19 1637 71     Resp 10/25/19 1637 20     Temp 10/25/19 1637 98.6 F (37 C)     Temp Source 10/25/19 1637 Oral     SpO2 10/25/19 1637 94 %     Weight 10/25/19 1638 232 lb (105.2 kg)     Height 10/25/19 1638 6' (1.829 m)     Head Circumference --      Peak Flow --      Pain Score 10/25/19 1637 0     Pain Loc --      Pain Edu? --      Excl. in GC? --     Constitutional: Alert and oriented. Well appearing and in no distress. Head: Normocephalic and atraumatic.  Mild maxillary sinus pressure noted. Eyes: Conjunctivae are normal. Normal extraocular movements Ears: Canals clear. TMs intact bilaterally. Nose: No congestion/rhinorrhea/epistaxis.  Nasal turbinates are pink, moist, slightly enlarged. Mouth/Throat: Mucous membranes are moist.  No oral lesions noted. Neck: Supple. No thyromegaly. Hematological/Lymphatic/Immunological: No cervical lymphadenopathy. Cardiovascular: Normal rate, regular rhythm. Normal distal pulses. Respiratory: Normal respiratory effort. No wheezes/rales/rhonchi. Gastrointestinal: Soft and nontender. No distention. Musculoskeletal: Nontender with normal range of  motion in all extremities.  Neurologic:  Normal gait without ataxia. Normal speech and language. No gross focal neurologic deficits are appreciated. Skin:  Skin is warm, dry and intact. No rash noted. ____________________________________________  PROCEDURES  Procedures ____________________________________________  INITIAL IMPRESSION / ASSESSMENT AND PLAN / ED COURSE  DDX: sinusitis, allergic rhinitis, AOM   Patient with ED evaluation of 2-week complaint of sinus congestion and purulent nasal drainage.  Patient's exam is overall benign reassuring at this time.  He will be treated for an acute sinusitis with a prescription for Augmentin.  He is also advised to continue with his daily allergy medicine.  He will follow-up with his primary provider or return to the ED as needed.  Rayshaun Needle. was evaluated in Emergency Department on 10/26/2019 for the symptoms described in the history of present illness. He was evaluated in the context of the global COVID-19 pandemic, which necessitated consideration that the patient might be at risk for infection with the SARS-CoV-2 virus that causes COVID-19. Institutional protocols and algorithms that pertain  to the evaluation of patients at risk for COVID-19 are in a state of rapid change based on information released by regulatory bodies including the CDC and federal and state organizations. These policies and algorithms were followed during the patient's care in the ED. ____________________________________________  FINAL CLINICAL IMPRESSION(S) / ED DIAGNOSES  Final diagnoses:  Acute non-recurrent maxillary sinusitis      Karmen Stabs, Charlesetta Ivory, PA-C 10/26/19 1409    Minna Antis, MD 10/28/19 [phone]

## 2019-11-07 ENCOUNTER — Other Ambulatory Visit: Payer: Self-pay | Admitting: General Surgery

## 2019-11-09 ENCOUNTER — Other Ambulatory Visit: Payer: Self-pay | Admitting: Cardiovascular Disease

## 2019-11-12 ENCOUNTER — Other Ambulatory Visit: Payer: Self-pay

## 2019-11-12 MED ORDER — ISOSORBIDE MONONITRATE ER 60 MG PO TB24: 60.0000 mg | ORAL_TABLET | Freq: Every day | ORAL | 3 refills | Status: DC

## 2019-11-27 ENCOUNTER — Other Ambulatory Visit: Payer: Self-pay

## 2019-11-27 ENCOUNTER — Other Ambulatory Visit
Admission: RE | Admit: 2019-11-27 | Discharge: 2019-11-27 | Disposition: A | Discharge status: Home / Self Care | Payer: Medicare HMO | Source: Ambulatory Visit | Attending: General Surgery | Admitting: General Surgery

## 2019-11-27 DIAGNOSIS — Z20822 Contact with and (suspected) exposure to covid-19: Secondary | ICD-10-CM | POA: Diagnosis not present

## 2019-11-27 DIAGNOSIS — Z01812 Encounter for preprocedural laboratory examination: Secondary | ICD-10-CM | POA: Insufficient documentation

## 2019-11-27 LAB — SARS CORONAVIRUS 2 (TAT 6-24 HRS): SARS Coronavirus 2: NEGATIVE

## 2019-11-28 ENCOUNTER — Encounter: Payer: Self-pay | Admitting: General Surgery

## 2019-11-29 ENCOUNTER — Encounter: Payer: Self-pay | Admitting: General Surgery

## 2019-11-29 ENCOUNTER — Other Ambulatory Visit: Payer: Self-pay

## 2019-11-29 ENCOUNTER — Ambulatory Visit: Payer: Medicare HMO | Admitting: Certified Registered"

## 2019-11-29 ENCOUNTER — Encounter
Admission: RE | Disposition: A | Discharge status: Home / Self Care | Payer: Self-pay | Source: Home / Self Care | Attending: General Surgery

## 2019-11-29 ENCOUNTER — Ambulatory Visit
Admission: RE | Admit: 2019-11-29 | Discharge: 2019-11-29 | Disposition: A | Discharge status: Home / Self Care | Payer: Medicare HMO | Attending: General Surgery | Admitting: General Surgery

## 2019-11-29 DIAGNOSIS — I2510 Atherosclerotic heart disease of native coronary artery without angina pectoris: Secondary | ICD-10-CM | POA: Insufficient documentation

## 2019-11-29 DIAGNOSIS — Z7982 Long term (current) use of aspirin: Secondary | ICD-10-CM | POA: Insufficient documentation

## 2019-11-29 DIAGNOSIS — Z79899 Other long term (current) drug therapy: Secondary | ICD-10-CM | POA: Diagnosis not present

## 2019-11-29 DIAGNOSIS — Z951 Presence of aortocoronary bypass graft: Secondary | ICD-10-CM | POA: Diagnosis not present

## 2019-11-29 DIAGNOSIS — Z1211 Encounter for screening for malignant neoplasm of colon: Secondary | ICD-10-CM | POA: Diagnosis not present

## 2019-11-29 DIAGNOSIS — I10 Essential (primary) hypertension: Secondary | ICD-10-CM | POA: Insufficient documentation

## 2019-11-29 DIAGNOSIS — Z8719 Personal history of other diseases of the digestive system: Secondary | ICD-10-CM | POA: Diagnosis not present

## 2019-11-29 DIAGNOSIS — M8949 Other hypertrophic osteoarthropathy, multiple sites: Secondary | ICD-10-CM | POA: Diagnosis not present

## 2019-11-29 DIAGNOSIS — E7800 Pure hypercholesterolemia, unspecified: Secondary | ICD-10-CM | POA: Insufficient documentation

## 2019-11-29 HISTORY — PX: COLONOSCOPY WITH PROPOFOL: SHX5780

## 2019-11-29 SURGERY — COLONOSCOPY WITH PROPOFOL
Anesthesia: General

## 2019-11-29 MED ORDER — SODIUM CHLORIDE 0.9 % IV SOLN
INTRAVENOUS | Status: DC
  Administered 2019-11-29: 1000 mL via INTRAVENOUS

## 2019-11-29 MED ORDER — PROPOFOL 500 MG/50ML IV EMUL
INTRAVENOUS | Status: DC | PRN
  Administered 2019-11-29: 120 ug/kg/min via INTRAVENOUS

## 2019-11-29 MED ORDER — PROPOFOL 500 MG/50ML IV EMUL
INTRAVENOUS | Status: AC
  Filled 2019-11-29: qty 50

## 2019-11-29 NOTE — Anesthesia Postprocedure Evaluation (Signed)
Anesthesia Post Note  Patient: Ralf Konopka.  Procedure(s) Performed: COLONOSCOPY WITH PROPOFOL (N/A )  Patient location during evaluation: Endoscopy Anesthesia Type: General Level of consciousness: awake and alert Pain management: pain level controlled Vital Signs Assessment: post-procedure vital signs reviewed and stable Respiratory status: spontaneous breathing, nonlabored ventilation, respiratory function stable and patient connected to nasal cannula oxygen Cardiovascular status: blood pressure returned to baseline and stable Postop Assessment: no apparent nausea or vomiting Anesthetic complications: no     Last Vitals:  Vitals:   11/29/19 0910 11/29/19 0920  BP: 127/73 139/82  Pulse: 62 62  Resp: 19 20  Temp:    SpO2: 99% 99%    Last Pain:  Vitals:   11/29/19 0840  TempSrc: Temporal  PainSc:                  Cleda Mccreedy Alyrica Thurow

## 2019-11-29 NOTE — H&P (Signed)
Dale Cook [id number] Jan 12, 1946     HPI:  74 y/o male with a history of a SSE in the rectum in 2018.  For follow up exam.   Medications Prior to Admission  Medication Sig Dispense Refill Last Dose  . amLODipine (NORVASC) 10 MG tablet Take 1 tablet (10 mg total) by mouth daily. 90 tablet 3 11/29/2019 at 0515  . aspirin EC 81 MG tablet Take 1 tablet (81 mg total) by mouth daily. 90 tablet 3 11/28/2019 at Unknown time  . atorvastatin (LIPITOR) 40 MG tablet Take 40 mg by mouth daily.   11/28/2019 at Unknown time  . carvedilol (COREG) 25 MG tablet TAKE 1 TABLET TWICE DAILY 90 tablet 3 11/29/2019 at 0515  . cetirizine (ZYRTEC) 10 MG tablet Take 10 mg by mouth daily.   11/28/2019 at Unknown time  . fluticasone (FLONASE) 50 MCG/ACT nasal spray Place 2 sprays into both nostrils daily. 16 g 0 11/28/2019 at Unknown time  . isosorbide mononitrate (IMDUR) 60 MG 24 hr tablet Take 1 tablet (60 mg total) by mouth daily. 90 tablet 3 11/29/2019 at 515  . moexipril (UNIVASC) 15 MG tablet Take 15 mg by mouth at bedtime.    11/28/2019 at Unknown time  . pantoprazole (PROTONIX) 40 MG tablet TAKE 1 TABLET EVERY DAY 90 tablet 0 11/28/2019 at Unknown time  . meloxicam (MOBIC) 15 MG tablet Take 15 mg by mouth daily.    at prn   No Known Allergies Past Medical History:  Diagnosis Date  . Arthritis    "hands, knees" (01/09/2017)  . CAD (coronary artery disease)    a. 12/2016 Cath: LM nl, LAD 95p, 34m, 60/90d, LCX 80p/m, 174m, OM1 53m, OM2 90, RCA 99p, Ef 55-65%; b. 12/2016 CABG x 4 (LIMA->mLAD, VG->dLAD, VG->OM1->OM2); c. 11/2018 MV: EF 66%, no ischemia.  . Essential hypertension   . High cholesterol   . History of echocardiogram    a. 12/2016 Echo: EF 65-70%.   Past Surgical History:  Procedure Laterality Date  . CARDIAC CATHETERIZATION  01/09/2017   "scheduling me for OHS"  . COLONOSCOPY WITH PROPOFOL N/A 11/09/2016   Procedure: COLONOSCOPY WITH PROPOFOL;  Surgeon: Robert Bellow, MD;  Location: [facility] ENDOSCOPY;   Service: Endoscopy;  Laterality: N/A;  . CORONARY ARTERY BYPASS GRAFT N/A 01/12/2017   CABG x 4: LIMA to mLAD, SVG to dLAD, sequential SVG to OM1-OM2, EVH via left thigh and leg;  Surgeon: Rexene Alberts, MD;  Location: [place];  Service: Open Heart Surgery;  Laterality: N/A;  . INTRAOPERATIVE TRANSESOPHAGEAL ECHOCARDIOGRAM N/A 01/12/2017   Procedure: INTRAOPERATIVE TRANSESOPHAGEAL ECHOCARDIOGRAM;  Surgeon: Rexene Alberts, MD;  Location: [place];  Service: Open Heart Surgery;  Laterality: N/A;  . LEFT HEART CATH AND CORONARY ANGIOGRAPHY Left 01/09/2017   Procedure: Left Heart Cath and Coronary Angiography;  Surgeon: Wellington Hampshire, MD;  Location: [place] [facility];  Service: Cardiovascular;  Laterality: Left;   Social History   Socioeconomic History  . Marital status: Divorced    Spouse name: Not on file  . Number of children: Not on file  . Years of education: Not on file  . Highest education level: Not on file  Occupational History  . Not on file  Tobacco Use  . Smoking status: Never Smoker  . Smokeless tobacco: Never Used  Substance and Sexual Activity  . Alcohol use: No  . Drug use: No  . Sexual activity: Not Currently  Other Topics Concern  . Not on  file  Social History Narrative  . Not on file   Social Determinants of Health   Financial Resource Strain:   . Difficulty of Paying Living Expenses:   Food Insecurity:   . Worried About Programme researcher, broadcasting/film/video in the Last Year:   . Barista in the Last Year:   Transportation Needs:   . Freight forwarder (Medical):   Marland Kitchen Lack of Transportation (Non-Medical):   Physical Activity:   . Days of Exercise per Week:   . Minutes of Exercise per Session:   Stress:   . Feeling of Stress :   Social Connections:   . Frequency of Communication with Friends and Family:   . Frequency of Social Gatherings with Friends and Family:   . Attends Religious Services:   . Active Member of Clubs or Organizations:   . Attends Tax inspector Meetings:   Marland Kitchen Marital Status:   Intimate Partner Violence:   . Fear of Current or Ex-Partner:   . Emotionally Abused:   Marland Kitchen Physically Abused:   . Sexually Abused:    Social History   Social History Narrative  . Not on file     ROS: Negative.     PE: HEENT: Negative. Lungs: Clear. Cardio: RR.  Assessment/Plan:  Proceed with planned endoscopy.  Merrily Pew [facility] 11/29/2019

## 2019-11-29 NOTE — Op Note (Signed)
[facility] Patient Name: Dale Cook Procedure Date: 11/29/2019 8:08 AM MRN: [id number] Account #: [id number] Date of Birth: 1946/05/16 Admit Type: Outpatient Age: 74 Room: [facility] END[facility] ROOM 1 Gender: Male Note Status: Finalized Procedure:             Colonoscopy Indications:           High risk colon cancer surveillance: Personal history                         of colonic polyps Providers:             Robert Bellow, MD Medicines:             Monitored Anesthesia Care Complications:         No immediate complications. Procedure:             Pre-Anesthesia Assessment:                        - Prior to the procedure, a History and Physical was                         performed, and patient medications, allergies and                         sensitivities were reviewed. The patient's tolerance                         of previous anesthesia was reviewed.                        - The risks and benefits of the procedure and the                         sedation options and risks were discussed with the                         patient. All questions were answered and informed                         consent was obtained.                        After obtaining informed consent, the colonoscope was                         passed under direct vision. Throughout the procedure,                         the patient's blood pressure, pulse, and oxygen                         saturations were monitored continuously. The was                         introduced through the anus and advanced to the the                         cecum, identified by appendiceal orifice and ileocecal  valve. The colonoscopy was somewhat difficult due to                         significant looping. Successful completion of the                         procedure was aided by using manual pressure. The                         patient tolerated the procedure well. The  quality of                         the bowel preparation was good. Findings:      The entire examined colon appeared normal on direct and retroflexion       views. Impression:            - The entire examined colon is normal on direct and                         retroflexion views.                        - No specimens collected. Recommendation:        - Repeat colonoscopy in 5 years for surveillance. Procedure Code(s):     --- Professional ---                        [phone], Colonoscopy, flexible; diagnostic, including                         collection of specimen(s) by brushing or washing, when                         performed (separate procedure) Diagnosis Code(s):     --- Professional ---                        Z86.010, Personal history of colonic polyps CPT copyright 2019 American Medical Association. All rights reserved. The codes documented in this report are preliminary and upon coder review may  be revised to meet current compliance requirements. Earline Mayotte, MD 11/29/2019 8:45:43 AM This report has been signed electronically. Number of Addenda: 0 Note Initiated On: 11/29/2019 8:08 AM Scope Withdrawal Time: 0 hours 11 minutes 23 seconds  Total Procedure Duration: 0 hours 26 minutes 33 seconds       [facility]

## 2019-11-29 NOTE — Transfer of Care (Signed)
Immediate Anesthesia Transfer of Care Note  Patient: Dale Cook.  Procedure(s) Performed: COLONOSCOPY WITH PROPOFOL (N/A )  Patient Location: PACU and Endoscopy Unit  Anesthesia Type:General  Level of Consciousness: sedated  Airway & Oxygen Therapy: Patient Spontanous Breathing and Patient connected to nasal cannula oxygen  Post-op Assessment: Report given to RN  Post vital signs: stable  Last Vitals:  Vitals Value Taken Time  BP    Temp    Pulse    Resp    SpO2      Last Pain:  Vitals:   11/29/19 0736  PainSc: 0-No pain         Complications: No apparent anesthesia complications

## 2019-11-29 NOTE — Anesthesia Preprocedure Evaluation (Signed)
Anesthesia Evaluation  Patient identified by MRN, date of birth, ID band Patient awake    Reviewed: Allergy & Precautions, H&P , NPO status , Patient's Chart, lab work & pertinent test results  History of Anesthesia Complications Negative for: history of anesthetic complications  Airway Mallampati: III  TM Distance: >3 FB Neck ROM: limited    Dental  (+) Chipped, Poor Dentition, Missing, Partial Upper   Pulmonary neg pulmonary ROS, neg shortness of breath,    Pulmonary exam normal        Cardiovascular Exercise Tolerance: Good hypertension, (-) angina+ CAD  (-) DOE Normal cardiovascular exam     Neuro/Psych negative neurological ROS  negative psych ROS   GI/Hepatic negative GI ROS, Neg liver ROS, neg GERD  ,  Endo/Other  negative endocrine ROS  Renal/GU negative Renal ROS  negative genitourinary   Musculoskeletal   Abdominal   Peds  Hematology negative hematology ROS (+)   Anesthesia Other Findings Past Medical History: No date: Arthritis     Comment:  "hands, knees" (01/09/2017) No date: CAD (coronary artery disease)     Comment:  a. 12/2016 Cath: LM nl, LAD 95p, 25m, 60/90d, LCX 80p/m,               161m, OM1 28m, OM2 90, RCA 99p, Ef 55-65%; b. 12/2016 CABG              x 4 (LIMA->mLAD, VG->dLAD, VG->OM1->OM2); c. 11/2018 MV:               EF 66%, no ischemia. No date: Essential hypertension No date: High cholesterol No date: History of echocardiogram     Comment:  a. 12/2016 Echo: EF 65-70%.  Past Surgical History: 01/09/2017: CARDIAC CATHETERIZATION     Comment:  "scheduling me for OHS" 11/09/2016: COLONOSCOPY WITH PROPOFOL; N/A     Comment:  Procedure: COLONOSCOPY WITH PROPOFOL;  Surgeon: Earline Mayotte, MD;  Location: [facility] ENDOSCOPY;  Service:               Endoscopy;  Laterality: N/A; 01/12/2017: CORONARY ARTERY BYPASS GRAFT; N/A     Comment:  CABG x 4: LIMA to mLAD, SVG to dLAD,  sequential SVG to               OM1-OM2, EVH via left thigh and leg;  Surgeon: Purcell Nails, MD;  Location: [facility] OR;  Service: Open Heart               Surgery;  Laterality: N/A; 01/12/2017: INTRAOPERATIVE TRANSESOPHAGEAL ECHOCARDIOGRAM; N/A     Comment:  Procedure: INTRAOPERATIVE TRANSESOPHAGEAL               ECHOCARDIOGRAM;  Surgeon: Purcell Nails, MD;                Location: [facility] OR;  Service: Open Heart Surgery;                Laterality: N/A; 01/09/2017: LEFT HEART CATH AND CORONARY ANGIOGRAPHY; Left     Comment:  Procedure: Left Heart Cath and Coronary Angiography;                Surgeon: Iran Ouch, MD;  Location: [facility] INVASIVE               CV LAB;  Service: Cardiovascular;  Laterality:  Left;  BMI    Body Mass Index: 31.53 kg/m      Reproductive/Obstetrics negative OB ROS                             Anesthesia Physical Anesthesia Plan  ASA: III  Anesthesia Plan: General   Post-op Pain Management:    Induction: Intravenous  PONV Risk Score and Plan: Propofol infusion and TIVA  Airway Management Planned: Natural Airway and Nasal Cannula  Additional Equipment:   Intra-op Plan:   Post-operative Plan:   Informed Consent: I have reviewed the patients History and Physical, chart, labs and discussed the procedure including the risks, benefits and alternatives for the proposed anesthesia with the patient or authorized representative who has indicated his/her understanding and acceptance.     Dental Advisory Given  Plan Discussed with: Anesthesiologist, CRNA and Surgeon  Anesthesia Plan Comments: (Patient consented for risks of anesthesia including but not limited to:  - adverse reactions to medications - risk of intubation if required - damage to eyes, teeth, lips or other oral mucosa - nerve damage due to positioning  - sore throat or hoarseness - Damage to heart, brain, nerves, lungs, other parts of body or loss  of life  Patient voiced understanding.)        Anesthesia Quick Evaluation

## 2019-12-02 ENCOUNTER — Encounter: Payer: Self-pay | Admitting: *Deleted

## 2020-01-13 ENCOUNTER — Other Ambulatory Visit: Payer: Self-pay | Admitting: Cardiovascular Disease

## 2020-01-22 ENCOUNTER — Other Ambulatory Visit: Payer: Self-pay | Admitting: Physician Assistant

## 2020-03-20 ENCOUNTER — Other Ambulatory Visit: Payer: Self-pay | Admitting: Cardiovascular Disease

## 2020-04-09 ENCOUNTER — Other Ambulatory Visit: Payer: Self-pay

## 2020-04-09 MED ORDER — CARVEDILOL 25 MG PO TABS: 25.0000 mg | ORAL_TABLET | Freq: Two times a day (BID) | ORAL | 0 refills | Status: DC

## 2020-04-20 ENCOUNTER — Other Ambulatory Visit: Payer: Self-pay | Admitting: *Deleted

## 2020-04-20 MED ORDER — AMLODIPINE BESYLATE 10 MG PO TABS: 10.0000 mg | ORAL_TABLET | Freq: Every day | ORAL | 0 refills | Status: DC

## 2020-05-01 ENCOUNTER — Ambulatory Visit: Payer: Medicare HMO | Admitting: Cardiovascular Disease

## 2020-05-01 ENCOUNTER — Encounter: Payer: Self-pay | Admitting: Cardiovascular Disease

## 2020-05-01 ENCOUNTER — Other Ambulatory Visit: Payer: Self-pay

## 2020-05-01 VITALS — BP 140/80 | HR 66 | Ht 72.0 in | Wt 241.4 lb

## 2020-05-01 DIAGNOSIS — E785 Hyperlipidemia, unspecified: Secondary | ICD-10-CM | POA: Diagnosis not present

## 2020-05-01 DIAGNOSIS — I2510 Atherosclerotic heart disease of native coronary artery without angina pectoris: Secondary | ICD-10-CM

## 2020-05-01 DIAGNOSIS — I10 Essential (primary) hypertension: Secondary | ICD-10-CM

## 2020-05-01 NOTE — Patient Instructions (Addendum)
Medication Instructions:  - Your physician recommends that you continue on your current medications as directed. Please refer to the Current Medication list given to you today.  *If you need a refill on your cardiac medications before your next appointment, please call your pharmacy*   Lab Work: - none ordered  If you have labs (blood work) drawn today and your tests are completely normal, you will receive your results only by: . MyChart Message (if you have MyChart) OR . A paper copy in the mail If you have any lab test that is abnormal or we need to change your treatment, we will call you to review the results.   Testing/Procedures: - none ordered   Follow-Up: At [facility], you and your health needs are our priority.  As part of our continuing mission to provide you with exceptional heart care, we have created designated Provider Care Teams.  These Care Teams include your primary Cardiologist (physician) and Advanced Practice Providers (APPs -  Physician Assistants and Nurse Practitioners) who all work together to provide you with the care you need, when you need it.  We recommend signing up for the patient portal called "MyChart".  Sign up information is provided on this After Visit Summary.  MyChart is used to connect with patients for Virtual Visits (Telemedicine).  Patients are able to view lab/test results, encounter notes, upcoming appointments, etc.  Non-urgent messages can be sent to your provider as well.   To learn more about what you can do with MyChart, go to [email or web address].    Your next appointment:   6 month(s)  The format for your next appointment:   In Person  Provider:   You may see Muhammad Arida, MD or one of the following Advanced Practice Providers on your designated Care Team:    Christopher Berge, NP  Ryan Dunn, PA-C  Jacquelyn Visser, PA-C  Cadence Furth, PA-C    Other Instructions n/a  

## 2020-05-01 NOTE — Progress Notes (Signed)
Cardiology Office Note   Date:  05/01/2020   ID:  Dale Cook., DOB Jun 18, 1946, MRN [id number]  PCP:  Dale Shaggy, MD  Cardiologist:   Dale Bears, MD   Chief Complaint  Patient presents with  . other    6 month follow up. Meds reviewed by the pt. verbally. "doing well."      History of Present Illness: Dale Cook. is a 74 y.o. male who is here today for follow-up visit regarding coronary artery disease status post CABG in July 2018 for severe three-vessel coronary artery disease after presenting with unstable angina.  Echocardiogram showed normal LV systolic function with no significant valvular abnormalities.  Carotid Doppler before surgery showed no significant disease. He has known history of hypertension and hyperlipidemia. 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  He was hospitalized in June 2020 with chest pain in the setting of hypertensive urgency.  He ruled out for myocardial infarction.  Lexiscan Myoview was negative for ischemia.  Additional antihypertensive medications were added with subsequent improvement.  He has been doing well with no recent chest pain, shortness of breath or palpitations.  He has been gradually gaining weight.  Past Medical History:  Diagnosis Date  . Arthritis    "hands, knees" (01/09/2017)  . CAD (coronary artery disease)    a. 12/2016 Cath: LM nl, LAD 95p, 86m, 60/90d, LCX 80p/m, 176m, OM1 81m, OM2 90, RCA 99p, Ef 55-65%; b. 12/2016 CABG x 4 (LIMA->mLAD, VG->dLAD, VG->OM1->OM2); c. 11/2018 MV: EF 66%, no ischemia.  . Essential hypertension   . High cholesterol   . History of echocardiogram    a. 12/2016 Echo: EF 65-70%.    Past Surgical History:  Procedure Laterality Date  . CARDIAC CATHETERIZATION  01/09/2017   "scheduling me for OHS"  . COLONOSCOPY WITH PROPOFOL N/A 11/09/2016   Procedure: COLONOSCOPY WITH PROPOFOL;  Surgeon: Dale Mayotte, MD;  Location: [facility] ENDOSCOPY;  Service: Endoscopy;   Laterality: N/A;  . COLONOSCOPY WITH PROPOFOL N/A 11/29/2019   Procedure: COLONOSCOPY WITH PROPOFOL;  Surgeon: Dale Mayotte, MD;  Location: [facility] ENDOSCOPY;  Service: Endoscopy;  Laterality: N/A;  . CORONARY ARTERY BYPASS GRAFT N/A 01/12/2017   CABG x 4: LIMA to mLAD, SVG to dLAD, sequential SVG to OM1-OM2, EVH via left thigh and leg;  Surgeon: Dale Nails, MD;  [facility] OR;  Service: Open Heart Surgery;  Laterality: N/A;  . INTRAOPERATIVE TRANSESOPHAGEAL ECHOCARDIOGRAM N/A 01/12/2017   Procedure: INTRAOPERATIVE TRANSESOPHAGEAL ECHOCARDIOGRAM;  Surgeon: Dale Nails, MD;  Location: [facility] OR;  Service: Open Heart Surgery;  Laterality: N/A;  . LEFT HEART CATH AND CORONARY ANGIOGRAPHY Left 01/09/2017   Procedure: Left Heart Cath and Coronary Angiography;  Surgeon: Dale Ouch, MD;  Location: [facility];  Service: Cardiovascular;  Laterality: Left;     Current Outpatient Medications  Medication Sig Dispense Refill  . amLODipine (NORVASC) 10 MG tablet Take 1 tablet (10 mg total) by mouth daily. 90 tablet 0  . atorvastatin (LIPITOR) 40 MG tablet Take 40 mg by mouth daily.    . carvedilol (COREG) 25 MG tablet Take 1 tablet (25 mg total) by mouth 2 (two) times daily. 180 tablet 0  . cetirizine (ZYRTEC) 10 MG tablet Take 10 mg by mouth daily.    . fluticasone (FLONASE) 50 MCG/ACT nasal spray Place 2 sprays into both nostrils daily. 16 g 0  . GNP ASPIRIN 81 MG EC tablet TAKE 1 TABLET EVERY DAY 90  tablet 0  . isosorbide mononitrate (IMDUR) 60 MG 24 hr tablet Take 1 tablet (60 mg total) by mouth daily. 90 tablet 3  . meloxicam (MOBIC) 15 MG tablet Take 15 mg by mouth daily.    . moexipril (UNIVASC) 15 MG tablet Take 15 mg by mouth at bedtime.     . pantoprazole (PROTONIX) 40 MG tablet TAKE 1 TABLET EVERY DAY 90 tablet 0   No current facility-administered medications for this visit.    Allergies:   Patient has no known allergies.    Social History:  The patient  reports  that he has never smoked. He has never used smokeless tobacco. He reports that he does not drink alcohol and does not use drugs.   Family History:  The patient's family history includes Colon cancer in his brother; Heart attack in his mother.    ROS:  Please see the history of present illness.   Otherwise, review of systems are positive for none.   All other systems are reviewed and negative.    PHYSICAL EXAM: VS:  BP 140/80 (BP Location: Left Arm, Patient Position: Sitting, Cuff Size: Normal)   Pulse 66   Ht 6' (1.829 m)   Wt 241 lb 6 oz (109.5 kg)   SpO2 97%   BMI 32.74 kg/m  , BMI Body mass index is 32.74 kg/m. GEN: Well nourished, well developed, in no acute distress  HEENT: normal  Neck: no JVD,  or masses. Left carotid bruit Cardiac: RRR; no  rubs, or gallops,no edema .  2/6 systolic ejection murmur in the aortic area which is early peaking Respiratory:  clear to auscultation bilaterally, normal work of breathing GI: soft, nontender, nondistended, + BS MS: no deformity or atrophy  Skin: warm and dry, no rash Neuro:  Strength and sensation are intact Psych: euthymic mood, full affect   EKG:  EKG is ordered today. 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  No new changes.   Recent Labs: No results found for requested labs within last 8760 hours.    Lipid Panel    Component Value Date/Time   CHOL 112 03/17/2017 0904   TRIG 70 03/17/2017 0904   HDL 31 (L) 03/17/2017 0904   CHOLHDL 3.6 03/17/2017 0904   VLDL 14 03/17/2017 0904   LDLCALC 67 03/17/2017 0904      Wt Readings from Last 3 Encounters:  05/01/20 241 lb 6 oz (109.5 kg)  11/29/19 232 lb 7.6 oz (105.4 kg)  10/25/19 232 lb (105.2 kg)      PAD Screen 01/05/2017  Previous PAD dx? No  Previous surgical procedure? No  Pain with walking? Yes  Subsides with rest? Yes  Feet/toe relief with dangling? No  Painful, non-healing ulcers? No  Extremities discolored? No       ASSESSMENT AND PLAN:  1.   Coronary artery disease involving native coronary arteries without angina: He is doing very well with no anginal symptoms.  Continue medical therapy.    2. Left carotid bruit: Carotid Doppler before CABG showed no significant disease.  3. Hyperlipidemia: Continue treatment with atorvastatin.  His LDL has been below 70 and is managed by his primary care physician.  4.  Essential hypertension: Blood pressure is well controlled on current medications.   Disposition:   FU with me in 6 month  Signed,  Dale Bears, MD  05/01/2020 3:55 PM    [facility]

## 2020-05-29 ENCOUNTER — Other Ambulatory Visit: Payer: Self-pay | Admitting: Cardiovascular Disease

## 2020-06-04 ENCOUNTER — Encounter: Payer: Self-pay | Admitting: Ophthalmology

## 2020-06-04 ENCOUNTER — Other Ambulatory Visit: Payer: Self-pay

## 2020-06-10 ENCOUNTER — Other Ambulatory Visit
Admission: RE | Admit: 2020-06-10 | Discharge: 2020-06-10 | Disposition: A | Discharge status: Home / Self Care | Payer: Medicare HMO | Source: Ambulatory Visit | Attending: Ophthalmology | Admitting: Ophthalmology

## 2020-06-10 DIAGNOSIS — Z01812 Encounter for preprocedural laboratory examination: Secondary | ICD-10-CM | POA: Insufficient documentation

## 2020-06-10 DIAGNOSIS — Z20822 Contact with and (suspected) exposure to covid-19: Secondary | ICD-10-CM | POA: Diagnosis not present

## 2020-06-10 LAB — SARS CORONAVIRUS 2 (TAT 6-24 HRS): SARS Coronavirus 2: NEGATIVE

## 2020-06-10 NOTE — Discharge Instructions (Signed)

## 2020-06-12 ENCOUNTER — Other Ambulatory Visit: Payer: Self-pay

## 2020-06-12 ENCOUNTER — Encounter
Admission: RE | Disposition: A | Discharge status: Home / Self Care | Payer: Self-pay | Source: Home / Self Care | Attending: Ophthalmology

## 2020-06-12 ENCOUNTER — Ambulatory Visit
Admission: RE | Admit: 2020-06-12 | Discharge: 2020-06-12 | Disposition: A | Discharge status: Home / Self Care | Payer: Medicare HMO | Attending: Ophthalmology | Admitting: Ophthalmology

## 2020-06-12 ENCOUNTER — Ambulatory Visit: Payer: Medicare HMO | Admitting: Anesthesiology

## 2020-06-12 ENCOUNTER — Encounter: Payer: Self-pay | Admitting: Ophthalmology

## 2020-06-12 DIAGNOSIS — Z791 Long term (current) use of non-steroidal anti-inflammatories (NSAID): Secondary | ICD-10-CM | POA: Insufficient documentation

## 2020-06-12 DIAGNOSIS — Z7982 Long term (current) use of aspirin: Secondary | ICD-10-CM | POA: Diagnosis not present

## 2020-06-12 DIAGNOSIS — H2512 Age-related nuclear cataract, left eye: Secondary | ICD-10-CM | POA: Insufficient documentation

## 2020-06-12 DIAGNOSIS — Z79899 Other long term (current) drug therapy: Secondary | ICD-10-CM | POA: Insufficient documentation

## 2020-06-12 HISTORY — DX: Presence of dental prosthetic device (complete) (partial): Z97.2

## 2020-06-12 HISTORY — PX: CATARACT EXTRACTION W/PHACO: SHX586

## 2020-06-12 SURGERY — PHACOEMULSIFICATION, CATARACT, WITH IOL INSERTION
Anesthesia: Monitor Anesthesia Care | Site: Eye | Laterality: Left

## 2020-06-12 MED ORDER — BRIMONIDINE TARTRATE-TIMOLOL 0.2-0.5 % OP SOLN
OPHTHALMIC | Status: DC | PRN
  Administered 2020-06-12: 1 [drp] via OPHTHALMIC

## 2020-06-12 MED ORDER — LACTATED RINGERS IV SOLN: INTRAVENOUS | Status: DC

## 2020-06-12 MED ORDER — PROMETHAZINE HCL 25 MG/ML IJ SOLN: 6.2500 mg | INTRAMUSCULAR | Status: DC | PRN

## 2020-06-12 MED ORDER — NA HYALUR & NA CHOND-NA HYALUR 0.4-0.35 ML IO KIT
PACK | INTRAOCULAR | Status: DC | PRN
  Administered 2020-06-12: 1 mL via INTRAOCULAR

## 2020-06-12 MED ORDER — CEFUROXIME OPHTHALMIC INJECTION 1 MG/0.1 ML
INJECTION | OPHTHALMIC | Status: DC | PRN
  Administered 2020-06-12: 0.1 mL via INTRACAMERAL

## 2020-06-12 MED ORDER — LIDOCAINE HCL (PF) 2 % IJ SOLN
INTRAOCULAR | Status: DC | PRN
  Administered 2020-06-12: 1 mL

## 2020-06-12 MED ORDER — ARMC OPHTHALMIC DILATING DROPS
1.0000 "application " | OPHTHALMIC | Status: DC | PRN
  Administered 2020-06-12 (×3): 1 via OPHTHALMIC

## 2020-06-12 MED ORDER — MEPERIDINE HCL 25 MG/ML IJ SOLN: 6.2500 mg | INTRAMUSCULAR | Status: DC | PRN

## 2020-06-12 MED ORDER — TETRACAINE HCL 0.5 % OP SOLN
1.0000 [drp] | OPHTHALMIC | Status: DC | PRN
  Administered 2020-06-12 (×3): 1 [drp] via OPHTHALMIC

## 2020-06-12 MED ORDER — OXYCODONE HCL 5 MG/5ML PO SOLN: 5.0000 mg | Freq: Once | ORAL | Status: DC | PRN

## 2020-06-12 MED ORDER — OXYCODONE HCL 5 MG PO TABS: 5.0000 mg | ORAL_TABLET | Freq: Once | ORAL | Status: DC | PRN

## 2020-06-12 MED ORDER — MIDAZOLAM HCL 2 MG/2ML IJ SOLN
INTRAMUSCULAR | Status: DC | PRN
  Administered 2020-06-12 (×2): 1 mg via INTRAVENOUS

## 2020-06-12 MED ORDER — EPINEPHRINE PF 1 MG/ML IJ SOLN
INTRAOCULAR | Status: DC | PRN
  Administered 2020-06-12: 70 mL via OPHTHALMIC

## 2020-06-12 MED ORDER — FENTANYL CITRATE (PF) 100 MCG/2ML IJ SOLN
25.0000 ug | INTRAMUSCULAR | Status: DC | PRN
  Administered 2020-06-12 (×2): 50 ug via INTRAVENOUS

## 2020-06-12 SURGICAL SUPPLY — 22 items
CANNULA ANT/CHMB 27G (MISCELLANEOUS) ×1 IMPLANT
CANNULA ANT/CHMB 27GA (MISCELLANEOUS) ×2 IMPLANT
GLOVE SURG LX 7.5 STRW (GLOVE) ×1
GLOVE SURG LX STRL 7.5 STRW (GLOVE) ×1 IMPLANT
GLOVE SURG TRIUMPH 8.0 PF LTX (GLOVE) ×2 IMPLANT
GOWN STRL REUS W/ TWL LRG LVL3 (GOWN DISPOSABLE) ×2 IMPLANT
GOWN STRL REUS W/TWL LRG LVL3 (GOWN DISPOSABLE) ×4
LENS IOL TECNIS EYHANCE 17.5 (Intraocular Lens) ×1 IMPLANT
MARKER SKIN DUAL TIP RULER LAB (MISCELLANEOUS) ×2 IMPLANT
NDL CAPSULORHEX 25GA (NEEDLE) ×1 IMPLANT
NDL FILTER BLUNT 18X1 1/2 (NEEDLE) ×2 IMPLANT
NEEDLE CAPSULORHEX 25GA (NEEDLE) ×2 IMPLANT
NEEDLE FILTER BLUNT 18X 1/2SAF (NEEDLE) ×2
NEEDLE FILTER BLUNT 18X1 1/2 (NEEDLE) ×2 IMPLANT
PACK CATARACT BRASINGTON (MISCELLANEOUS) ×2 IMPLANT
PACK EYE AFTER SURG (MISCELLANEOUS) ×2 IMPLANT
PACK OPTHALMIC (MISCELLANEOUS) ×2 IMPLANT
SOLUTION OPHTHALMIC SALT (MISCELLANEOUS) ×2 IMPLANT
SYR 3ML LL SCALE MARK (SYRINGE) ×4 IMPLANT
SYR TB 1ML LUER SLIP (SYRINGE) ×2 IMPLANT
WATER STERILE IRR 250ML POUR (IV SOLUTION) ×2 IMPLANT
WIPE NON LINTING 3.25X3.25 (MISCELLANEOUS) ×2 IMPLANT

## 2020-06-12 NOTE — Transfer of Care (Signed)
Immediate Anesthesia Transfer of Care Note  Patient: Dale Cook.  Procedure(s) Performed: CATARACT EXTRACTION PHACO AND INTRAOCULAR LENS PLACEMENT (IOC) LEFT 4.36 00:51.9 8.4% (Left Eye)  Patient Location: PACU  Anesthesia Type: MAC  Level of Consciousness: awake, alert  and patient cooperative  Airway and Oxygen Therapy: Patient Spontanous Breathing and Patient connected to supplemental oxygen  Post-op Assessment: Post-op Vital signs reviewed, Patient's Cardiovascular Status Stable, Respiratory Function Stable, Patent Airway and No signs of Nausea or vomiting  Post-op Vital Signs: Reviewed and stable  Complications: No complications documented.

## 2020-06-12 NOTE — H&P (Signed)

## 2020-06-12 NOTE — Anesthesia Postprocedure Evaluation (Signed)
Anesthesia Post Note  Patient: Juddson Cobern.  Procedure(s) Performed: CATARACT EXTRACTION PHACO AND INTRAOCULAR LENS PLACEMENT (IOC) LEFT 4.36 00:51.9 8.4% (Left Eye)     Patient location during evaluation: PACU Anesthesia Type: MAC Level of consciousness: awake and alert Pain management: pain level controlled Vital Signs Assessment: post-procedure vital signs reviewed and stable Respiratory status: spontaneous breathing, nonlabored ventilation, respiratory function stable and patient connected to nasal cannula oxygen Cardiovascular status: stable and blood pressure returned to baseline Postop Assessment: no apparent nausea or vomiting Anesthetic complications: no   No complications documented.  Emerita Berkemeier, [place]ade Stanford

## 2020-06-12 NOTE — Anesthesia Preprocedure Evaluation (Signed)
Anesthesia Evaluation  Patient identified by MRN, date of birth, ID band Patient awake    Reviewed: Allergy & Precautions, H&P , NPO status , Patient's Chart, lab work & pertinent test results, reviewed documented beta blocker date and time   Airway Mallampati: II  TM Distance: >3 FB Neck ROM: full    Dental no notable dental hx.    Pulmonary neg pulmonary ROS,    Pulmonary exam normal breath sounds clear to auscultation       Cardiovascular Exercise Tolerance: Good hypertension, + CAD   Rhythm:regular Rate:Normal     Neuro/Psych negative neurological ROS  negative psych ROS   GI/Hepatic negative GI ROS, Neg liver ROS,   Endo/Other  negative endocrine ROS  Renal/GU negative Renal ROS  negative genitourinary   Musculoskeletal   Abdominal   Peds  Hematology negative hematology ROS (+)   Anesthesia Other Findings   Reproductive/Obstetrics negative OB ROS                             Anesthesia Physical Anesthesia Plan  ASA: II  Anesthesia Plan: MAC   Post-op Pain Management:    Induction:   PONV Risk Score and Plan: 1 and TIVA, Midazolam and Treatment may vary due to age or medical condition  Airway Management Planned:   Additional Equipment:   Intra-op Plan:   Post-operative Plan:   Informed Consent: I have reviewed the patients History and Physical, chart, labs and discussed the procedure including the risks, benefits and alternatives for the proposed anesthesia with the patient or authorized representative who has indicated his/her understanding and acceptance.     Dental Advisory Given  Plan Discussed with: CRNA  Anesthesia Plan Comments:         Anesthesia Quick Evaluation

## 2020-06-12 NOTE — Anesthesia Procedure Notes (Signed)
Procedure Name: MAC Date/Time: 06/12/2020 8:16 AM Performed by: Silvana Newness, CRNA Pre-anesthesia Checklist: Patient identified, Emergency Drugs available, Suction available, Patient being monitored and Timeout performed Patient Re-evaluated:Patient Re-evaluated prior to induction Oxygen Delivery Method: Nasal cannula Placement Confirmation: positive ETCO2

## 2020-06-12 NOTE — Op Note (Signed)
OPERATIVE NOTE  Antonius Hartlage. [id number] 06/12/2020   PREOPERATIVE DIAGNOSIS:  Nuclear sclerotic cataract left eye. H25.12   POSTOPERATIVE DIAGNOSIS:    Nuclear sclerotic cataract left eye.     PROCEDURE:  Phacoemusification with posterior chamber intraocular lens placement of the left eye  Ultrasound time: Procedure(s): CATARACT EXTRACTION PHACO AND INTRAOCULAR LENS PLACEMENT (IOC) LEFT 4.36 00:51.9 8.4% (Left)  LENS:   Implant Name Type Inv. Item Serial No. Manufacturer Lot No. LRB No. Used Action  LENS IOL TECNIS EYHANCE 17.5 - [id number] Intraocular Lens LENS IOL TECNIS EYHANCE 17.5 [id number] JOHNSON   Left 1 Implanted      SURGEON:  Deirdre Evener, MD   ANESTHESIA:  Topical with tetracaine drops and 2% Xylocaine jelly, augmented with 1% preservative-free intracameral lidocaine.    COMPLICATIONS:  None.   DESCRIPTION OF PROCEDURE:  The patient was identified in the holding room and transported to the operating room and placed in the supine position under the operating microscope.  The left eye was identified as the operative eye and it was prepped and draped in the usual sterile ophthalmic fashion.   A 1 millimeter clear-corneal paracentesis was made at the 1:30 position.  0.5 ml of preservative-free 1% lidocaine was injected into the anterior chamber.  The anterior chamber was filled with Viscoat viscoelastic.  A 2.4 millimeter keratome was used to make a near-clear corneal incision at the 10:30 position.  .  A curvilinear capsulorrhexis was made with a cystotome and capsulorrhexis forceps.  Balanced salt solution was used to hydrodissect and hydrodelineate the nucleus.   Phacoemulsification was then used in stop and chop fashion to remove the lens nucleus and epinucleus.  The remaining cortex was then removed using the irrigation and aspiration handpiece. Provisc was then placed into the capsular bag to distend it for lens placement.  A lens was then injected  into the capsular bag.  The remaining viscoelastic was aspirated.   Wounds were hydrated with balanced salt solution.  The anterior chamber was inflated to a physiologic pressure with balanced salt solution.  No wound leaks were noted. Cefuroxime 0.1 ml of a 10mg /ml solution was injected into the anterior chamber for a dose of 1 mg of intracameral antibiotic at the completion of the case.   Timolol and Brimonidine drops were applied to the eye.  The patient was taken to the recovery room in stable condition without complications of anesthesia or surgery.  Rubyann Lingle 06/12/2020, 8:28 AM

## 2020-06-15 ENCOUNTER — Encounter: Payer: Self-pay | Admitting: Ophthalmology

## 2020-06-23 ENCOUNTER — Other Ambulatory Visit: Payer: Self-pay

## 2020-06-23 MED ORDER — AMLODIPINE BESYLATE 10 MG PO TABS: 10.0000 mg | ORAL_TABLET | Freq: Every day | ORAL | 0 refills | Status: DC

## 2020-07-09 ENCOUNTER — Other Ambulatory Visit: Payer: Self-pay | Admitting: *Deleted

## 2020-07-09 MED ORDER — CARVEDILOL 25 MG PO TABS: 25.0000 mg | ORAL_TABLET | Freq: Two times a day (BID) | ORAL | 2 refills | Status: DC

## 2020-08-25 ENCOUNTER — Other Ambulatory Visit: Payer: Self-pay

## 2020-08-25 MED ORDER — ISOSORBIDE MONONITRATE ER 60 MG PO TB24: 60.0000 mg | ORAL_TABLET | Freq: Every day | ORAL | 0 refills | Status: DC

## 2020-09-09 ENCOUNTER — Other Ambulatory Visit: Payer: Self-pay | Admitting: *Deleted

## 2020-09-09 MED ORDER — AMLODIPINE BESYLATE 10 MG PO TABS: 10.0000 mg | ORAL_TABLET | Freq: Every day | ORAL | 0 refills | Status: DC

## 2020-09-19 ENCOUNTER — Emergency Department: Payer: Medicare HMO

## 2020-09-19 ENCOUNTER — Emergency Department
Admission: EM | Admit: 2020-09-19 | Discharge: 2020-09-19 | Disposition: A | Discharge status: Home / Self Care | Payer: Medicare HMO | Attending: Emergency Medicine | Admitting: Emergency Medicine

## 2020-09-19 DIAGNOSIS — R790 Abnormal level of blood mineral: Secondary | ICD-10-CM | POA: Diagnosis not present

## 2020-09-19 DIAGNOSIS — Z20822 Contact with and (suspected) exposure to covid-19: Secondary | ICD-10-CM | POA: Insufficient documentation

## 2020-09-19 DIAGNOSIS — R0600 Dyspnea, unspecified: Secondary | ICD-10-CM | POA: Diagnosis not present

## 2020-09-19 DIAGNOSIS — Z79899 Other long term (current) drug therapy: Secondary | ICD-10-CM | POA: Diagnosis not present

## 2020-09-19 DIAGNOSIS — R7989 Other specified abnormal findings of blood chemistry: Secondary | ICD-10-CM

## 2020-09-19 DIAGNOSIS — R059 Cough, unspecified: Secondary | ICD-10-CM | POA: Diagnosis not present

## 2020-09-19 DIAGNOSIS — Z951 Presence of aortocoronary bypass graft: Secondary | ICD-10-CM | POA: Diagnosis not present

## 2020-09-19 DIAGNOSIS — I10 Essential (primary) hypertension: Secondary | ICD-10-CM | POA: Diagnosis not present

## 2020-09-19 DIAGNOSIS — I2510 Atherosclerotic heart disease of native coronary artery without angina pectoris: Secondary | ICD-10-CM | POA: Diagnosis not present

## 2020-09-19 DIAGNOSIS — R053 Chronic cough: Secondary | ICD-10-CM

## 2020-09-19 LAB — COMPREHENSIVE METABOLIC PANEL
ALT: 15 U/L (ref 0–44)
AST: 16 U/L (ref 15–41)
Albumin: 3.4 g/dL — ABNORMAL LOW (ref 3.5–5.0)
Alkaline Phosphatase: 85 U/L (ref 38–126)
Anion gap: 8 (ref 5–15)
BUN: 15 mg/dL (ref 8–23)
CO2: 24 mmol/L (ref 22–32)
Calcium: 8.6 mg/dL — ABNORMAL LOW (ref 8.9–10.3)
Chloride: 101 mmol/L (ref 98–111)
Creatinine, Ser: 1.22 mg/dL (ref 0.61–1.24)
GFR, Estimated: >60 mL/min (ref >60)
Glucose, Bld: 128 mg/dL — ABNORMAL HIGH (ref 70–99)
Potassium: 4 mmol/L (ref 3.5–5.1)
Sodium: 133 mmol/L — ABNORMAL LOW (ref 135–145)
Total Bilirubin: 0.6 mg/dL (ref 0.3–1.2)
Total Protein: 7.5 g/dL (ref 6.5–8.1)

## 2020-09-19 LAB — CBC WITH DIFFERENTIAL/PLATELET
Abs Immature Granulocytes: 0.03 10*3/uL (ref 0.00–0.07)
Basophils Absolute: 0 10*3/uL (ref 0.0–0.1)
Basophils Relative: 1 %
Eosinophils Absolute: 0.5 10*3/uL (ref 0.0–0.5)
Eosinophils Relative: 6 %
HCT: 41 % (ref 39.0–52.0)
Hemoglobin: 13 g/dL (ref 13.0–17.0)
Immature Granulocytes: 0 %
Lymphocytes Relative: 14 %
Lymphs Abs: 1.2 10*3/uL (ref 0.7–4.0)
MCH: 26.3 pg (ref 26.0–34.0)
MCHC: 31.7 g/dL (ref 30.0–36.0)
MCV: 82.8 fL (ref 80.0–100.0)
Monocytes Absolute: 1.2 10*3/uL — ABNORMAL HIGH (ref 0.1–1.0)
Monocytes Relative: 14 %
Neutro Abs: 5.6 10*3/uL (ref 1.7–7.7)
Neutrophils Relative %: 65 %
Platelets: 219 10*3/uL (ref 150–400)
RBC: 4.95 MIL/uL (ref 4.22–5.81)
RDW: 14.4 % (ref 11.5–15.5)
WBC: 8.6 10*3/uL (ref 4.0–10.5)
nRBC: 0 % (ref 0.0–0.2)

## 2020-09-19 LAB — TROPONIN I (HIGH SENSITIVITY): Troponin I (High Sensitivity): 6 ng/L (ref <18)

## 2020-09-19 LAB — BRAIN NATRIURETIC PEPTIDE: B Natriuretic Peptide: 211.7 pg/mL — ABNORMAL HIGH (ref 0.0–100.0)

## 2020-09-19 MED ORDER — IOHEXOL 350 MG/ML SOLN
75.0000 mL | Freq: Once | INTRAVENOUS | Status: AC | PRN
  Administered 2020-09-19: 75 mL via INTRAVENOUS
  Filled 2020-09-19: qty 75

## 2020-09-19 NOTE — ED Triage Notes (Signed)
Pt presents via POV c/o cough x3 months with productive cough at night. Tested negative for Covid in Feb per pt report.

## 2020-09-20 LAB — SARS CORONAVIRUS 2 (TAT 6-24 HRS): SARS Coronavirus 2: NEGATIVE

## 2020-09-20 NOTE — ED Provider Notes (Signed)
[facility] Emergency Department Provider Note  ____________________________________________   Event Date/Time   First MD Initiated Contact with Patient 09/19/20 2147     (approximate)  I have reviewed the triage vital signs and the nursing notes.   HISTORY  Chief Complaint Cough  HPI Dale Cook. is a 75 y.o. male who presents to the emergency department for evaluation of cough over the last 3 months.  Patient describes his daytime cough as dry and nagging, states that he has sputum production at night.  He has been tested several times for Covid, and has remained negative with these evaluations.  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  He states that he has never been told that he has heart failure and states he is not on any medications for this.  He states compliance with his pre-existing medications.  In association with this cough, he has not had any fevers.  He does endorse some mild increasing dyspnea and chest pain with exertion, but states that this quickly improves when he rests.  He states that this is usually with more vigorous activities and not a simple leisurely walk.  He denies any chest pain, shortness of breath or other symptoms at this moment other than the existing cough.         Past Medical History:  Diagnosis Date  . Arthritis    "hands, knees" (01/09/2017)  . CAD (coronary artery disease)    a. 12/2016 Cath: LM nl, LAD 95p, 37m, 60/90d, LCX 80p/m, 121m, OM1 38m, OM2 90, RCA 99p, Ef 55-65%; b. 12/2016 CABG x 4 (LIMA->mLAD, VG->dLAD, VG->OM1->OM2); c. 11/2018 MV: EF 66%, no ischemia.  . Essential hypertension   . High cholesterol   . History of echocardiogram    a. 12/2016 Echo: EF 65-70%.  . Wears dentures    partial upper    Patient Active Problem List   Diagnosis Date Noted  . Chest pain 12/07/2018  . Hyperlipidemia with target low density lipoprotein (LDL)  cholesterol less than 70 mg/dL 74/16/3845  . 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 Coronary artery disease involving native coronary artery with angina pectoris (HCC) 01/09/2017  . Unstable angina (HCC)   . Essential hypertension   . Encounter for screening colonoscopy 09/21/2016    Past Surgical History:  Procedure Laterality Date  . CARDIAC CATHETERIZATION  01/09/2017   "scheduling me for [facility]"  . CATARACT EXTRACTION W/PHACO Left 06/12/2020   Procedure: CATARACT EXTRACTION PHACO AND INTRAOCULAR LENS PLACEMENT (IOC) LEFT 4.36 00:51.9 8.4%;  Surgeon: Lockie Mola, MD;  Location[facility];  Service: Ophthalmology;  Laterality: Left;  . COLONOSCOPY WITH PROPOFOL N/A 11/09/2016   Procedure: COLONOSCOPY WITH PROPOFOL;  Surgeon: Earline Mayotte, MD;  Location: [facility] END[facility]COPY;  Service: Endoscopy;  Laterality: N/A;  . COLONOSCOPY WITH PROPOFOL N/A 11/29/2019   Procedure: COLONOSCOPY WITH PROPOFOL;  Surgeon: Earline Mayotte, MD;  Location: [facility] ENDOSCOPY;  Service: Endoscopy;  Laterality: N/A;  . CORONARY ARTERY BYPASS GRAFT N/A 01/12/2017   CABG x 4: LIMA to mLAD, SVG to dLAD, sequential SVG to OM1-OM2, EVH via left thigh and leg;  Surgeon: Purcell Nails, MD;  Location: [facility] OR;  Service: Open Heart Surgery;  Laterality: N/A;  . INTRAOPERATIVE TRANSESOPHAGEAL ECHOCARDIOGRAM N/A 01/12/2017   Procedure: INTRAOPERATIVE TRANSESOPHAGEAL ECHOCARDIOGRAM;  Surgeon: Purcell Nails, MD;  Location: [facility] OR;  Service: Open Heart Surgery;  Laterality: N/A;  . LEFT  HEART CATH AND CORONARY ANGIOGRAPHY Left 01/09/2017   Procedure: Left Heart Cath and Coronary Angiography;  Surgeon: Iran Ouch, MD;  Location: [facility]ASIVE [facility];  Service: Cardiovascular;  Laterality: Left;    Prior to Admission medications   Medication Sig Start Date End Date Taking? Authorizing Provider  amLODipine (NORVASC) 10 MG tablet Take 1 tablet (10 mg total) by mouth daily. 09/09/20   Creig Hines, NP   atorvastatin (LIPITOR) 40 MG tablet Take 40 mg by mouth daily.    [provider]  carvedilol (COREG) 25 MG tablet Take 1 tablet (25 mg total) by mouth 2 (two) times daily. 07/09/20   Creig Hines, NP  cetirizine (ZYRTEC) 10 MG tablet Take 10 mg by mouth daily.    [provider]  fluticasone (FLONASE) 50 MCG/ACT nasal spray Place 2 sprays into both nostrils daily. 10/25/19   Menshew, Charlesetta Ivory, PA-C  GNP ASPIRIN 81 MG EC tablet TAKE 1 TABLET EVERY DAY 01/23/20   Sondra Barges, PA-C  isosorbide mononitrate (IMDUR) 60 MG 24 hr tablet Take 1 tablet (60 mg total) by mouth daily. 08/25/20 08/20/21  Creig Hines, NP  meloxicam (MOBIC) 15 MG tablet Take 15 mg by mouth daily. 10/05/18   [provider]  moexipril (UNIVASC) 15 MG tablet Take 15 mg by mouth at bedtime.  10/24/18   [provider]  pantoprazole (PROTONIX) 40 MG tablet TAKE 1 TABLET EVERY DAY 06/01/20   Iran Ouch, MD    Allergies Patient has no known allergies.  Family History  Problem Relation Age of Onset  . Heart attack Mother   . Colon cancer Brother     Social History Social History   Tobacco Use  . Smoking status: Never Smoker  . Smokeless tobacco: Never Used  Vaping Use  . Vaping Use: Never used  Substance Use Topics  . Alcohol use: No  . Drug use: No    Review of Systems Constitutional: No fever/chills Eyes: No visual changes. ENT: No sore throat. Cardiovascular: + chest pain with exertion. Respiratory: + Cough, + shortness of breath with exertion. Gastrointestinal: No abdominal pain.  No nausea, no vomiting.  No diarrhea.  No constipation. Genitourinary: Negative for dysuria. Musculoskeletal: Negative for back pain. Skin: Negative for rash. Neurological: Negative for headaches, focal weakness or numbness.   ____________________________________________   PHYSICAL EXAM:  VITAL SIGNS: ED Triage Vitals  Enc Vitals Group     BP 09/19/20  1830 (!) 158/77     Pulse Rate 09/19/20 1830 80     Resp 09/19/20 1830 16     Temp 09/19/20 1830 98.6 F (37 C)     Temp Source 09/19/20 1830 Oral     SpO2 09/19/20 1830 94 %     Weight --      Height --      Head Circumference --      Peak Flow --      Pain Score 09/19/20 2331 0     Pain Loc --      Pain Edu? --      Excl. in GC? --    Constitutional: Alert and oriented. Well appearing and in no acute distress. Eyes: Conjunctivae are normal. PERRL. EOMI. Head: Atraumatic. Nose: No congestion/rhinnorhea. Mouth/Throat: Mucous membranes are moist.  Oropharynx non-erythematous. Neck: No stridor.   Cardiovascular: Normal rate, regular rhythm. Grossly normal heart sounds.  Good peripheral circulation.  No significant pitting edema. Respiratory: Normal respiratory effort.  No  retractions. Lungs CTAB. Gastrointestinal: Soft and nontender. No distention. No abdominal bruits. No CVA tenderness. Musculoskeletal: No lower extremity tenderness nor edema.  No joint effusions. Neurologic:  Normal speech and language. No gross focal neurologic deficits are appreciated. No gait instability. Skin:  Skin is warm, dry and intact. No rash noted. Psychiatric: Mood and affect are normal. Speech and behavior are normal.  ____________________________________________   LABS (all labs ordered are listed, but only abnormal results are displayed)  Labs Reviewed  CBC WITH DIFFERENTIAL/PLATELET - Abnormal; Notable for the following components:      Result Value   Monocytes Absolute 1.2 (*)    All other components within normal limits  COMPREHENSIVE METABOLIC PANEL - Abnormal; Notable for the following components:   Sodium 133 (*)    Glucose, Bld 128 (*)    Calcium 8.6 (*)    Albumin 3.4 (*)    All other components within normal limits  BRAIN NATRIURETIC PEPTIDE - Abnormal; Notable for the following components:   B Natriuretic Peptide 211.7 (*)    All other components within normal limits  SARS  CORONAVIRUS 2 (TAT 6-24 HRS)  TROPONIN I (HIGH SENSITIVITY)  TROPONIN I (HIGH SENSITIVITY)   ____________________________________________  EKG  EKG reveals normal sinus rhythm with a rate of 68 bpm.  QT interval 398.  There is evidence of possible prior anterior infarct, though these Q waves were present on previous EKG dated 05/01/2020. ____________________________________________  RADIOLOGY I, Lucy Chrisaitlin J Rodgers, personally viewed and evaluated these images (plain radiographs) as part of my medical decision making, as well as reviewing the written report by the radiologist.  ED provider interpretation: Chest x-ray does not reveal any acute pneumonia; see radiology report for CT findings  Official radiology report(s): DG Chest 2 View  Result Date: 09/19/2020 CLINICAL DATA:  Cough for 3 months, productive cough at night, tested negative for COVID-19 in February. History hypertension, coronary artery disease EXAM: CHEST - 2 VIEW COMPARISON:  12/07/2018 FINDINGS: Normal heart size post CABG. Mediastinal contours and pulmonary vascularity normal. Atherosclerotic calcification aorta. Lungs clear. No infiltrate, pleural effusion, or pneumothorax. Endplate spur formation thoracic spine. IMPRESSION: Post CABG. No acute abnormalities. Electronically Signed   By: Ulyses SouthwardMark  Boles M.D.   On: 09/19/2020 19:04   CT Angio Chest PE W and/or Wo Contrast  Result Date: 09/19/2020 CLINICAL DATA:  Cough for 2 months EXAM: CT ANGIOGRAPHY CHEST WITH CONTRAST TECHNIQUE: Multidetector CT imaging of the chest was performed using the standard protocol during bolus administration of intravenous contrast. Multiplanar CT image reconstructions and MIPs were obtained to evaluate the vascular anatomy. CONTRAST:  75mL OMNIPAQUE IOHEXOL 350 MG/ML SOLN COMPARISON:  Chest x-ray from earlier in the same day. FINDINGS: Cardiovascular: No cardiomegaly is noted. Coronary calcifications are noted. Changes of prior coronary bypass grafting  are noted. Thoracic aorta shows atherosclerotic calcifications without aneurysmal dilatation. Pulmonary artery shows a normal branching pattern. No filling defects to suggest pulmonary embolism are noted. Mediastinum/Nodes: Thoracic inlet is within normal limits. No sizable hilar or mediastinal adenopathy is noted. The esophagus as is limits. Lungs/Pleura: Lungs are well aerated bilaterally. No focal infiltrate or sizable effusion is seen. Upper Abdomen: Visualized upper abdomen is unremarkable. Musculoskeletal: Degenerative changes of the thoracic spine are noted. No acute bony abnormality is seen. Review of the MIP images confirms the above findings. IMPRESSION: No evidence pulmonary emboli. No acute abnormality noted. Electronically Signed   By: Alcide CleverMark  Lukens M.D.   On: 09/19/2020 22:56   ____________________________________________   INITIAL  IMPRESSION / ASSESSMENT AND PLAN / ED COURSE  As part of my medical decision making, I reviewed the following data within the electronic MEDICAL RECORD NUMBER Nursing notes reviewed and incorporated, Labs reviewed, Radiograph reviewed, Evaluated by EM attending Dr. Fuller Plan and Notes from prior ED visits        Patient is a 75 year old male who presents to the emergency department for evaluation of cough for the last 3 months that is described as dry during the day, productive at night.  This is also associated with dyspnea as well as chest pain on exertion.  There are no other associated symptoms described by the patient.  He is status post CABG several years ago, followed closely by cardiology.  On physical exam, there are no gross abnormalities.  No significant pitting edema present.  Initial evaluation began with chest x-ray, Covid swab, troponin, CMP, CBC, BNP and EKG.  Troponin is within normal limits, CBC normal.  CMP does reveal a very mild hyponatremia, otherwise grossly within normal limits.  Covid is negative.  BNP is slightly elevated at 211.  Patient's last  BNP approximately 1 year ago was 500.  X-ray did not show any significant infiltrate or other acute process.  Given the persistence of symptoms with no clear explanation, CT angio was obtained.  There is no evidence of mass, acute infiltrate, pulmonary edema or other cause for the patient's symptoms.  EKG does show possible evidence of prior anterior/inferior infarct, however these Q waves were present on last EKG and are not new.  The patient was also seen and examined personally by Dr. Fuller Plan.  At this time, there is no clear explanation for the patient's complaint.  This could be due to early heart failure exacerbation but without significant lower extremity edema, no pulmonary edema on CT will not initiate the patient on any diuretics.  Patient was taken for ambulatory sats and did not drop below 93%.  Will encourage close cardiology follow-up.  Patient is amenable with this plan, and is stable at this time for outpatient follow-up.      ____________________________________________   FINAL CLINICAL IMPRESSION(S) / ED DIAGNOSES  Final diagnoses:  Chronic cough  Elevated brain natriuretic peptide (BNP) level     ED Discharge Orders    None      *Please note:  Dale Cook. was evaluated in Emergency Department on 09/20/2020 for the symptoms described in the history of present illness. He was evaluated in the context of the global COVID-19 pandemic, which necessitated consideration that the patient might be at risk for infection with the SARS-CoV-2 virus that causes COVID-19. Institutional protocols and algorithms that pertain to the evaluation of patients at risk for COVID-19 are in a state of rapid change based on information released by regulatory bodies including the CDC and federal and state organizations. These policies and algorithms were followed during the patient's care in the ED.  Some ED evaluations and interventions may be delayed as a result of limited staffing during  and the pandemic.*   Note:  This document was prepared using Dragon voice recognition software and may include unintentional dictation errors.   Lucy Chris, PA 09/20/20 1651    Concha Se, MD 09/20/20 1726

## 2020-09-21 ENCOUNTER — Telehealth: Payer: Self-pay | Admitting: Family

## 2020-09-21 ENCOUNTER — Encounter: Payer: Self-pay | Admitting: *Deleted

## 2020-09-21 ENCOUNTER — Ambulatory Visit: Payer: Medicare HMO | Admitting: Family

## 2020-09-21 ENCOUNTER — Telehealth: Payer: Self-pay | Admitting: Cardiovascular Disease

## 2020-09-21 ENCOUNTER — Encounter: Payer: Self-pay | Admitting: Family

## 2020-09-21 ENCOUNTER — Other Ambulatory Visit: Payer: Self-pay

## 2020-09-21 VITALS — BP 120/70 | HR 70 | Ht 76.0 in | Wt 230.0 lb

## 2020-09-21 DIAGNOSIS — I25118 Atherosclerotic heart disease of native coronary artery with other forms of angina pectoris: Secondary | ICD-10-CM

## 2020-09-21 DIAGNOSIS — I10 Essential (primary) hypertension: Secondary | ICD-10-CM

## 2020-09-21 DIAGNOSIS — R059 Cough, unspecified: Secondary | ICD-10-CM | POA: Diagnosis not present

## 2020-09-21 DIAGNOSIS — R7989 Other specified abnormal findings of blood chemistry: Secondary | ICD-10-CM

## 2020-09-21 DIAGNOSIS — E782 Mixed hyperlipidemia: Secondary | ICD-10-CM

## 2020-09-21 DIAGNOSIS — R600 Localized edema: Secondary | ICD-10-CM | POA: Diagnosis not present

## 2020-09-21 MED ORDER — BENZONATATE 100 MG PO CAPS: 200.0000 mg | ORAL_CAPSULE | Freq: Three times a day (TID) | ORAL | 0 refills | Status: DC | PRN

## 2020-09-21 MED ORDER — FUROSEMIDE 20 MG PO TABS: ORAL_TABLET | ORAL | 0 refills | Status: DC

## 2020-09-21 NOTE — Telephone Encounter (Signed)
Spoke with patient after receiving a referral from the ED after his recent visit to get him scheduled for a new patient appointment with [place] for the end of April after Waynard Reeds return and first available appointment that works for the patient. He also confirmed he has follow up appointment scheduled with cardiology as well.    Pardeep Pautz, NT

## 2020-09-21 NOTE — Patient Instructions (Addendum)
Medication Instructions:  - Your physician has recommended you make the following change in your medication:   1) START lasix 20 mg- take 1 tablet by mouth x 4 days  2) START tessalon perles 100 mg- take 2 capsules (200 mg) by mouth three times a day as needed for cough   *If you need a refill on your cardiac medications before your next appointment, please call your pharmacy*   Lab Work: - none ordered  If you have labs (blood work) drawn today and your tests are completely normal, you will receive your results only by: Marland Kitchen MyChart Message (if you have MyChart) OR . A paper copy in the mail If you have any lab test that is abnormal or we need to change your treatment, we will call you to review the results.   Testing/Procedures: - Your physician has requested that you have an echocardiogram. Echocardiography is a painless test that uses sound waves to create images of your heart. It provides your doctor with information about the size and shape of your heart and how well your heart's chambers and valves are working. This procedure takes approximately one hour. There are no restrictions for this procedure. There is a possibility that an IV may need to be started during your test to inject an image enhancing agent. This is done to obtain more optimal pictures of your heart. Therefore we ask that you do at least drink some water prior to coming in to hydrate your veins.     Follow-Up: At [facility], you and your health needs are our priority.  As part of our continuing mission to provide you with exceptional heart care, we have created designated Provider Care Teams.  These Care Teams include your primary Cardiologist (physician) and Advanced Practice Providers (APPs -  Physician Assistants and Nurse Practitioners) who all work together to provide you with the care you need, when you need it.  We recommend signing up for the patient portal called "MyChart".  Sign up information is  provided on this After Visit Summary.  MyChart is used to connect with patients for Virtual Visits (Telemedicine).  Patients are able to view lab/test results, encounter notes, upcoming appointments, etc.  Non-urgent messages can be sent to your provider as well.   To learn more about what you can do with MyChart, go to [email or web address].    Your next appointment:   3 weeks/ after the echo is completed   The format for your next appointment:   In Person  Provider:   You may see [name], MD or one of the following Advanced Practice Providers on your designated Care Team:    Nicolasa Ducking, NP  Eula Listen, PA-C  Marisue Ivan, PA-C  Cadence Buhler, [place]  Gillian Shields, NP    Other Instructions   Echocardiogram An echocardiogram is a test that uses sound waves (ultrasound) to produce images of the heart. Images from an echocardiogram can provide important information about:  Heart size and shape.  The size and thickness and movement of your heart's walls.  Heart muscle function and strength.  Heart valve function or if you have stenosis. Stenosis is when the heart valves are too narrow.  If blood is flowing backward through the heart valves (regurgitation).  A tumor or infectious growth around the heart valves.  Areas of heart muscle that are not working well because of poor blood flow or injury from a heart attack.  Aneurysm detection. An aneurysm is a weak or  damaged part of an artery wall. The wall bulges out from the normal force of blood pumping through the body. Tell a health care provider about:  Any allergies you have.  All medicines you are taking, including vitamins, herbs, eye drops, creams, and over-the-counter medicines.  Any blood disorders you have.  Any surgeries you have had.  Any medical conditions you have.  Whether you are pregnant or may be pregnant. What are the risks? Generally, this is a safe test. However, problems  may occur, including an allergic reaction to dye (contrast) that may be used during the test. What happens before the test? No specific preparation is needed. You may eat and drink normally. What happens during the test?  You will take off your clothes from the waist up and put on a hospital gown.  Electrodes or electrocardiogram (ECG)patches may be placed on your chest. The electrodes or patches are then connected to a device that monitors your heart rate and rhythm.  You will lie down on a table for an ultrasound exam. A gel will be applied to your chest to help sound waves pass through your skin.  A handheld device, called a transducer, will be pressed against your chest and moved over your heart. The transducer produces sound waves that travel to your heart and bounce back (or "echo" back) to the transducer. These sound waves will be captured in real-time and changed into images of your heart that can be viewed on a video monitor. The images will be recorded on a computer and reviewed by your health care provider.  You may be asked to change positions or hold your breath for a short time. This makes it easier to get different views or better views of your heart.  In some cases, you may receive contrast through an IV in one of your veins. This can improve the quality of the pictures from your heart. The procedure may vary among health care providers and hospitals.   What can I expect after the test? You may return to your normal, everyday life, including diet, activities, and medicines, unless your health care provider tells you not to do that. Follow these instructions at home:  It is up to you to get the results of your test. Ask your health care provider, or the department that is doing the test, when your results will be ready.  Keep all follow-up visits. This is important. Summary  An echocardiogram is a test that uses sound waves (ultrasound) to produce images of the  heart.  Images from an echocardiogram can provide important information about the size and shape of your heart, heart muscle function, heart valve function, and other possible heart problems.  You do not need to do anything to prepare before this test. You may eat and drink normally.  After the echocardiogram is completed, you may return to your normal, everyday life, unless your health care provider tells you not to do that. This information is not intended to replace advice given to you by your health care provider. Make sure you discuss any questions you have with your health care provider. Document Revised: 02/04/2020 Document Reviewed: 02/04/2020 Elsevier Patient Education  2021 [employer].

## 2020-09-21 NOTE — Progress Notes (Signed)
Office Visit    Patient Name: Jonan Seufert. Date of Encounter: 09/21/2020  PCP:  [name], MD   [facility]  Cardiologist:  [name], MD  Advanced Practice Provider:  No care team member to display Electrophysiologist:  None   Chief Complaint    Emanuelle Bastos. is a 75 y.o. male with a hx of CAD s/p CABG X3 12/2016, hypertension, hyperlipidemia presents today for ED follow up.   Past Medical History    Past Medical History:  Diagnosis Date  . Arthritis    "hands, knees" (01/09/2017)  . CAD (coronary artery disease)    a. 12/2016 Cath: LM nl, LAD 95p, 13m, 60/90d, LCX 80p/m, 122m, OM1 32m, OM2 90, RCA 99p, Ef 55-65%; b. 12/2016 CABG x 4 (LIMA->mLAD, VG->dLAD, VG->OM1->OM2); c. 11/2018 MV: EF 66%, no ischemia.  . Essential hypertension   . High cholesterol   . History of echocardiogram    a. 12/2016 Echo: EF 65-70%.  . Wears dentures    partial upper   Past Surgical History:  Procedure Laterality Date  . CARDIAC CATHETERIZATION  01/09/2017   "scheduling me for [facility]"  . CATARACT EXTRACTION W/PHACO Left 06/12/2020   Procedure: CATARACT EXTRACTION PHACO AND INTRAOCULAR LENS PLACEMENT (IOC) LEFT 4.36 00:51.9 8.4%;  Surgeon: Lockie Mola, MD;  Location: [facility];  Service: Ophthalmology;  Laterality: Left;  . COLONOSCOPY WITH PROPOFOL N/A 11/09/2016   Procedure: COLONOSCOPY WITH PROPOFOL;  Surgeon: Earline Mayotte, MD;  Location: [facility];  Service: Endoscopy;  Laterality: N/A;  . COLONOSCOPY WITH PROPOFOL N/A 11/29/2019   Procedure: COLONOSCOPY WITH PROPOFOL;  Surgeon: Earline Mayotte, MD;  Location: [facility]COPY;  Service: Endoscopy;  Laterality: N/A;  . CORONARY ARTERY BYPASS GRAFT N/A 01/12/2017   CABG x 4: LIMA to mLAD, SVG to dLAD, sequential SVG to OM1-OM2, EVH via left thigh and leg;  Surgeon: Purcell Nails, MD;  Location: [facility] OR;  Service: Open Heart Surgery;  Laterality: N/A;  .  INTRAOPERATIVE TRANSESOPHAGEAL ECHOCARDIOGRAM N/A 01/12/2017   Procedure: INTRAOPERATIVE TRANSESOPHAGEAL ECHOCARDIOGRAM;  Surgeon: Purcell Nails, MD;  Location: [facility] OR;  Service: Open Heart Surgery;  Laterality: N/A;  . LEFT HEART CATH AND CORONARY ANGIOGRAPHY Left 01/09/2017   Procedure: Left Heart Cath and Coronary Angiography;  Surgeon: Iran Ouch, MD;  Location: [facility] INVASIVE CV LAB;  Service: Cardiovascular;  Laterality: Left;    Allergies  No Known Allergies  History of Present Illness    Nicolas Banh. is a 75 y.o. male with a hx of  CAD s/p CABG X3 12/2016, hypertension, hyperlipidemia last seen by Dr. Kirke Corin 05/01/2020.  He was evaluated in 2018 due to unstable angina subsequent CABG x3 in July 2018.  Echo at that time with normal LVEF and no significant valvular abnormalities.  Carotid Doppler prior to surgery with no significant disease.  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  He was hospitalized June 2020 with chest pain in the setting of hypertensive urgency with Lexiscan Myoview negative for ischemia and antihypertensive regimen intensified with improvement.  When last seen November 2021 he was doing overall well with no recent chest pain, shortness of breath though gradually gaining weight.  Presented to the ED 09/19/2020 for evaluation of cough for the last 3 months.  Described as daytime cough dry nagging and sputum production at night. Lab work showed K4.0, creatinine 1.22, GFR greater than 60, albumin 3.4, BNP 211.7, high-sensitivity troponin 6, hemoglobin  13.  Covid testing was negative.  Chest x-ray with no acute findings.  CT angio chest no evidence of PE and no acute abnormality. EKG independently reviewed showed NSR with no acute ST/T wave changes.   He presents today for follow-up. Reports 3 month history of cough. Tells me he coughs up phlegm in the morning and late in the afternoon which is white. He notices wheezing. No fever. Tells me he gets dyspneic  with exertion, but not short of breath at rest. Will get some chest tightness when he gets short of breath. A month ago he was diagnosed with allergies and started on antihistamine with minimal improvement. He has noticed swelling in his legs and knees for the last couple months. Also noted previous swelling in his right arm and elbow which self resolved. He reports sometimes has orthopnea, but no PND.  EKGs/Labs/Other Studies Reviewed:   The following studies were reviewed today:  EKG:  No EKG is  ordered today.  Recent Labs: 09/19/2020: ALT 15; B Natriuretic Peptide 211.7; BUN 15; Creatinine, Ser 1.22; Hemoglobin 13.0; Platelets 219; Potassium 4.0; Sodium 133  Recent Lipid Panel    Component Value Date/Time   CHOL 112 03/17/2017 0904   TRIG 70 03/17/2017 0904   HDL 31 (L) 03/17/2017 0904   CHOLHDL 3.6 03/17/2017 0904   VLDL 14 03/17/2017 0904   LDLCALC 67 03/17/2017 0904    Home Medications   Current Meds  Medication Sig  . amLODipine (NORVASC) 10 MG tablet Take 1 tablet (10 mg total) by mouth daily.  Marland Kitchen atorvastatin (LIPITOR) 40 MG tablet Take 40 mg by mouth daily.  . benzonatate (TESSALON PERLES) 100 MG capsule Take 2 capsules (200 mg total) by mouth 3 (three) times daily as needed for cough.  . carvedilol (COREG) 25 MG tablet Take 1 tablet (25 mg total) by mouth 2 (two) times daily.  . cetirizine (ZYRTEC) 10 MG tablet Take 10 mg by mouth daily.  . furosemide (LASIX) 20 MG tablet One tablet daily for 5 days  . GNP ASPIRIN 81 MG EC tablet TAKE 1 TABLET EVERY DAY  . isosorbide mononitrate (IMDUR) 60 MG 24 hr tablet Take 1 tablet (60 mg total) by mouth daily.  . meloxicam (MOBIC) 15 MG tablet Take 15 mg by mouth daily.  . moexipril (UNIVASC) 15 MG tablet Take 15 mg by mouth at bedtime.   . montelukast (SINGULAIR) 10 MG tablet SMARTSIG:1 Tablet(s) By Mouth Every Evening  . pantoprazole (PROTONIX) 40 MG tablet TAKE 1 TABLET EVERY DAY     Review of Systems  All other systems  reviewed and are otherwise negative except as noted above.  Physical Exam    VS:  BP 120/70 (BP Location: Right Arm, Patient Position: Sitting, Cuff Size: Normal)   Pulse 70   Ht 6\' 4"  (1.93 m)   Wt 230 lb (104.3 kg)   SpO2 97%   BMI 28.00 kg/m  , BMI Body mass index is 28 kg/m.  Wt Readings from Last 3 Encounters:  09/21/20 230 lb (104.3 kg)  06/12/20 237 lb (107.5 kg)  05/01/20 241 lb 6 oz (109.5 kg)    GEN: Well nourished, well developed, in no acute distress. HEENT: normal. Neck: Supple, no JVD, carotid bruits, or masses. Cardiac: RRR, no murmurs, rubs, or gallops. No clubbing, cyanosis, edema.  Radials/PT 2+ and equal bilaterally.  Respiratory:  Respirations regular and unlabored, clear to auscultation bilaterally. GI: Soft, nontender, nondistended. MS: No deformity or atrophy. Skin: Warm and dry,  no rash. Neuro:  Strength and sensation are intact. Psych: Normal affect.  Assessment & Plan    1. Cough / Dyspnea on exertion / LE edema / elevated BNP- 3 month history of productive cough with white mucus and wheeze. Also intermittent LE edema though none appreciable on exam today. CXR and CTA 09/19/20 with no acute findings. CBC with no evidence of infection and no fever. BNP 211 though no pleural effusions noted by imaging. 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 He was started on Zyrtec and Singulair by urgent care 1 month ago with no improvement. Reports compliance with his Protonix and no indigestion symptoms, low suspicion GERD as contributory. Lung sounds clear on exam today. Will trial short course of Lasix 20mg  daily x 4 days to assess for improvement in LE edema and cough. Rx Tessalon pearles for cough. Plan for echocardiogram to rule out HF or valvular abnormality as contributory to LE edema. If workup unrevealing recommend referral to ENT.   2. CAD s/p CABG- Stable with no anginal symptoms. He notes some chest discomfort but only in the  setting of dyspnea and cough. EKG in the ED 09/19/20 NSR with no acute ST/T wave changes. Troponin 09/19/20 unremarkable. No indication for ischemic evaluation at this time.  3. HTN- BP well controlled. Continue current antihypertensive regimen.   4. HLD- Continue Atorvastatin 40mg  daily.  Disposition: Follow up in 3 week(s) with Dr. 09/21/20 or APP  Signed, , NP 09/21/2020, 5:53 PM [facility]

## 2020-09-21 NOTE — Telephone Encounter (Signed)
Pt c/o of Chest Pain: STAT if CP now or developed within 24 hours  1. Are you having CP right now? yes  2. Are you experiencing any other symptoms (ex. SOB, nausea, vomiting, sweating)? SOB, coughing, ED visit on Saturday who said there was fluid around his heart  3. How long have you been experiencing CP? Since saturday  4. Is your CP continuous or coming and going? Comes and goes, coughs bring it on  5. Have you taken Nitroglycerin? no ?

## 2020-09-21 NOTE — Telephone Encounter (Signed)
Spoke with the patient. Patient sts that he was seen in the ED on Saturday and was told that he has fluid around his heart. Pt had a troponin and bnp drawn. Patient sts that he has had a productive cough for several days. He denies COVID like symptoms. Patient denies swelling, pnd, or orthopnea.  Patient reports increase sob. Pt sts that the chest pain is associated with the cough. Patient is scheduled with CF on 09/24/20. He rqst to be seen sooner. Appt rescheduled for today @ 3pm with Gillian Shields, NP. Pt adv to arrive 20 min prior to the appt for check in.  Pt verbalized understanding and voiced appreciation for the call.

## 2020-09-24 ENCOUNTER — Ambulatory Visit: Payer: Medicare HMO | Admitting: Medical

## 2020-10-15 ENCOUNTER — Other Ambulatory Visit: Payer: Self-pay

## 2020-10-15 ENCOUNTER — Ambulatory Visit (INDEPENDENT_AMBULATORY_CARE_PROVIDER_SITE_OTHER): Payer: Medicare HMO

## 2020-10-15 DIAGNOSIS — R600 Localized edema: Secondary | ICD-10-CM

## 2020-10-15 DIAGNOSIS — R7989 Other specified abnormal findings of blood chemistry: Secondary | ICD-10-CM | POA: Diagnosis not present

## 2020-10-15 LAB — ECHOCARDIOGRAM COMPLETE
AR max vel: 1.28 cm2
AV Area VTI: 1.18 cm2
AV Area mean vel: 1.04 cm2
AV Mean grad: 8 mmHg
AV Peak grad: 14 mmHg
Ao pk vel: 1.87 m/s
Area-P 1/2: 2.67 cm2
P 1/2 time: 374 msec
S' Lateral: 3.3 cm

## 2020-10-20 ENCOUNTER — Ambulatory Visit: Payer: Medicare HMO | Admitting: Family

## 2020-10-23 ENCOUNTER — Encounter: Payer: Self-pay | Admitting: Family

## 2020-10-23 ENCOUNTER — Ambulatory Visit: Payer: Medicare HMO | Attending: Family | Admitting: Family

## 2020-10-23 ENCOUNTER — Other Ambulatory Visit: Payer: Self-pay

## 2020-10-23 VITALS — BP 160/80 | HR 70 | Resp 18 | Ht 72.0 in | Wt 226.0 lb

## 2020-10-23 DIAGNOSIS — Z7982 Long term (current) use of aspirin: Secondary | ICD-10-CM | POA: Diagnosis not present

## 2020-10-23 DIAGNOSIS — E785 Hyperlipidemia, unspecified: Secondary | ICD-10-CM | POA: Diagnosis not present

## 2020-10-23 DIAGNOSIS — Z8249 Family history of ischemic heart disease and other diseases of the circulatory system: Secondary | ICD-10-CM | POA: Diagnosis not present

## 2020-10-23 DIAGNOSIS — I2510 Atherosclerotic heart disease of native coronary artery without angina pectoris: Secondary | ICD-10-CM | POA: Diagnosis not present

## 2020-10-23 DIAGNOSIS — Z951 Presence of aortocoronary bypass graft: Secondary | ICD-10-CM | POA: Diagnosis not present

## 2020-10-23 DIAGNOSIS — I5032 Chronic diastolic (congestive) heart failure: Secondary | ICD-10-CM

## 2020-10-23 DIAGNOSIS — I110 Hypertensive heart disease with heart failure: Secondary | ICD-10-CM | POA: Insufficient documentation

## 2020-10-23 DIAGNOSIS — Z79899 Other long term (current) drug therapy: Secondary | ICD-10-CM | POA: Diagnosis not present

## 2020-10-23 DIAGNOSIS — Z791 Long term (current) use of non-steroidal anti-inflammatories (NSAID): Secondary | ICD-10-CM | POA: Insufficient documentation

## 2020-10-23 DIAGNOSIS — I10 Essential (primary) hypertension: Secondary | ICD-10-CM

## 2020-10-23 MED ORDER — FUROSEMIDE 20 MG PO TABS: 20.0000 mg | ORAL_TABLET | Freq: Every day | ORAL | 5 refills | Status: DC

## 2020-10-23 NOTE — Progress Notes (Signed)
[facility] - PHARMACIST COUNSELING NOTE  ADHERENCE ASSESSMENT  Adherence strategy: Stores medications together in view    Do you ever forget to take your medication? [] Yes (1) [x] No (0)  Do you ever skip doses due to side effects? [] Yes (1) [x] No (0)  Do you have trouble affording your medicines? [] Yes (1) [x] No (0)  Are you ever unable to pick up your medication due to transportation difficulties? [] Yes (1) [x] No (0)  Do you ever stop taking your medications because you don't believe they are helping? [] Yes (1) [x] No (0)  Total score _0______    Recommendations given to patient about increasing adherence: No missed doses of medications  Guideline-Directed Medical Therapy/Evidence Based Medicine  ACE/ARB/ARNI: moexipril Beta Blocker: carvedilol Aldosterone Antagonist: N/A Diuretic: furosemide    SUBJECTIVE  HPI:  Past Medical History:  Diagnosis Date  . Arthritis    "hands, knees" (01/09/2017)  . CAD (coronary artery disease)    a. 12/2016 Cath: LM nl, LAD 95p, 67m, 60/90d, LCX 80p/m, 187m, OM1 57m, OM2 90, RCA 99p, Ef 55-65%; b. 12/2016 CABG x 4 (LIMA->mLAD, VG->dLAD, VG->OM1->OM2); c. 11/2018 MV: EF 66%, no ischemia.  . Essential hypertension   . High cholesterol   . History of echocardiogram    a. 12/2016 Echo: EF 65-70%.  . Wears dentures    partial upper        OBJECTIVE   Vital signs: HR 70, BP 160/80, weight 102.5 kg ECHO: Date 10/15/20, EF 60-65%, no LVH, LA mildly dilated Cath: Date 01/09/17, EF 55-65%, notes significant three-vessel CAD  BMP Latest Ref Rng & Units 09/19/2020 12/06/2018 01/17/2017  Glucose 70 - 99 mg/dL 128(H) 130(H) 108(H)  BUN 8 - 23 mg/dL 15 16 21(H)  Creatinine 0.61 - 1.24 mg/dL 1.22 1.24 1.31(H)  Sodium 135 - 145 mmol/L 133(L) 138 137  Potassium 3.5 - 5.1 mmol/L 4.0 3.5 4.3  Chloride 98 - 111 mmol/L 101 109 101  CO2 22 - 32 mmol/L 24 23 25   Calcium 8.9 - 10.3 mg/dL 8.6(L) 8.5(L) 8.4(L)     ASSESSMENT 75 yo M presenting to heart failure clinic for new patient visit. PMH includes HLD, HTN, CAD, and arthritis. No barriers to adherence identified during medication reconciliation. Pt reports course of furosemide prescribed last week helped with swelling. Pt reports a weight loss of around 15 lbs after diuresis.    PLAN HTN/chest pain - Begin taking furosemide 20 mg daily - Continue moexipril 15 mg daily - Continue isosorbide mononitrate 60 mg daily - Continue amlodipine 10 mg daily  - Continue carvedilol 25 mg twice daily   HLD/CAD - Continue atorvastatin 40 mg daily  - Continue aspirin 81 mg daily   Acid Reflux - Continue pantoprazole 40 mg daily  Arthritis - Begin using Voltaren (diclofenac) gel to reduced meloxicam use   Congestion/phlegm production - Continue Mucinex twice daily as needed  Allergies - Continue loratadine 10 mg daily  Time spent: 15 minutes  Benn Moulder, PharmD Pharmacy Resident  10/23/2020 10:05 AM   Current Outpatient Medications:  .  amLODipine (NORVASC) 10 MG tablet, Take 1 tablet (10 mg total) by mouth daily., Disp: 90 tablet, Rfl: 0 .  atorvastatin (LIPITOR) 40 MG tablet, Take 40 mg by mouth daily., Disp: , Rfl:  .  benzonatate (TESSALON PERLES) 100 MG capsule, Take 2 capsules (200 mg total) by mouth 3 (three) times daily as needed for cough., Disp: 30 capsule, Rfl: 0 .  carvedilol (COREG) 25  MG tablet, Take 1 tablet (25 mg total) by mouth 2 (two) times daily., Disp: 180 tablet, Rfl: 2 .  cetirizine (ZYRTEC) 10 MG tablet, Take 10 mg by mouth daily., Disp: , Rfl:  .  furosemide (LASIX) 20 MG tablet, One tablet daily for 5 days, Disp: 4 tablet, Rfl: 0 .  GNP ASPIRIN 81 MG EC tablet, TAKE 1 TABLET EVERY DAY, Disp: 90 tablet, Rfl: 0 .  isosorbide mononitrate (IMDUR) 60 MG 24 hr tablet, Take 1 tablet (60 mg total) by mouth daily., Disp: 90 tablet, Rfl: 0 .  meloxicam (MOBIC) 15 MG tablet, Take 15 mg by mouth daily., Disp: , Rfl:   .  moexipril (UNIVASC) 15 MG tablet, Take 15 mg by mouth at bedtime. , Disp: , Rfl:  .  montelukast (SINGULAIR) 10 MG tablet, SMARTSIG:1 Tablet(s) By Mouth Every Evening, Disp: , Rfl:  .  pantoprazole (PROTONIX) 40 MG tablet, TAKE 1 TABLET EVERY DAY, Disp: 90 tablet, Rfl: 2   COUNSELING POINTS/CLINICAL PEARLS  Carvedilol (Goal: weight less than 85 kg is 25 mg BID, weight greater than 85 kg is 50 mg BID)  Patient should avoid activities requiring coordination until drug effects are realized, as drug may cause dizziness.  This drug may cause diarrhea, nausea, vomiting, arthralgia, back pain, myalgia, headache, vision disorder, erectile dysfunction, reduced libido, or fatigue.  Instruct patient to report signs/symptoms of adverse cardiovascular effects such as hypotension (especially in elderly patients), arrhythmias, syncope, palpitations, angina, or edema.  Drug may mask symptoms of hypoglycemia. Advise diabetic patients to carefully monitor blood sugar levels.  Patient should take drug with food.  Advise patient against sudden discontinuation of drug. Furosemide  Drug causes sun-sensitivity. Advise patient to use sunscreen and avoid tanning beds. Patient should avoid activities requiring coordination until drug effects are realized, as drug may cause dizziness, vertigo, or blurred vision. This drug may cause hyperglycemia, hyperuricemia, constipation, diarrhea, loss of appetite, nausea, vomiting, purpuric disorder, cramps, spasticity, asthenia, headache, paresthesia, or scaling eczema. Instruct patient to report unusual bleeding/bruising or signs/symptoms of hypotension, infection, pancreatitis, or ototoxicity (tinnitus, hearing impairment). Advise patient to report signs/symptoms of a severe skin reactions (flu-like symptoms, spreading red rash, or skin/mucous membrane blistering) or erythema multiforme. Instruct patient to eat high-potassium foods during drug therapy, as directed by healthcare  professional.  Patient should not drink alcohol while taking this drug.  DRUGS TO AVOID IN HEART FAILURE  Drug or Class Mechanism  Analgesics . NSAIDs . COX-2 inhibitors . Glucocorticoids  Sodium and water retention, increased systemic vascular resistance, decreased response to diuretics   Diabetes Medications . Metformin . Thiazolidinediones o Rosiglitazone (Avandia) o Pioglitazone (Actos) . DPP4 Inhibitors o Saxagliptin (Onglyza) o Sitagliptin (Januvia)   Lactic acidosis Possible calcium channel blockade   Unknown  Antiarrhythmics . Class I  o Flecainide o Disopyramide . Class III o Sotalol . Other o Dronedarone  Negative inotrope, proarrhythmic   Proarrhythmic, beta blockade  Negative inotrope  Antihypertensives . Alpha Blockers o Doxazosin . Calcium Channel Blockers o Diltiazem o Verapamil o Nifedipine . Central Alpha Adrenergics o Moxonidine . Peripheral Vasodilators o Minoxidil  Increases renin and aldosterone  Negative inotrope    Possible sympathetic withdrawal  Unknown  Anti-infective . Itraconazole . Amphotericin B  Negative inotrope Unknown  Hematologic . Anagrelide . Cilostazol   Possible inhibition of PD IV Inhibition of PD III causing arrhythmias  Neurologic/Psychiatric . Stimulants . Anti-Seizure Drugs o Carbamazepine o Pregabalin . Antidepressants o Tricyclics o Citalopram . Parkinsons o Bromocriptine  o Pergolide o Pramipexole . Antipsychotics o Clozapine . Antimigraine o Ergotamine o Methysergide . Appetite suppressants . Bipolar o Lithium  Peripheral alpha and beta agonist activity  Negative inotrope and chronotrope Calcium channel blockade  Negative inotrope, proarrhythmic Dose-dependent QT prolongation  Excessive serotonin activity/valvular damage Excessive serotonin activity/valvular damage Unknown  IgE mediated hypersensitivy, calcium channel blockade  Excessive serotonin activity/valvular  damage Excessive serotonin activity/valvular damage Valvular damage  Direct myofibrillar degeneration, adrenergic stimulation  Antimalarials . Chloroquine . Hydroxychloroquine Intracellular inhibition of lysosomal enzymes  Urologic Agents . Alpha Blockers o Doxazosin o Prazosin o Tamsulosin o Terazosin  Increased renin and aldosterone  Adapted from Page RL, et al. "Drugs That May Cause or Exacerbate Heart Failure: A Scientific Statement from the [place]." Circulation 2016; 170:Y17-C94. DOI: 10.1161/CIR.0000000000000426   MEDICATION ADHERENCES TIPS AND STRATEGIES 1. Taking medication as prescribed improves patient outcomes in heart failure (reduces hospitalizations, improves symptoms, increases survival) 2. Side effects of medications can be managed by decreasing doses, switching agents, stopping drugs, or adding additional therapy. Please let someone in the [facility] know if you have having bothersome side effects so we can modify your regimen. Do not alter your medication regimen without talking to [place].  3. Medication reminders can help patients remember to take drugs on time. If you are missing or forgetting doses you can try linking behaviors, using pill boxes, or an electronic reminder like an alarm on your phone or an app. Some people can also get automated phone calls as medication reminders.

## 2020-10-23 NOTE — Progress Notes (Signed)
Patient ID: Dale Cook., male    DOB: 1945-12-05, 75 y.o.   MRN: [id number]  HPI  [name] is a 75 y/o male with a history of CAD, hyperlipidemia, HTN, arthritis and chronic heart failure.   Echo report from 10/15/20 reviewed and showed an EF of 60-65% along with normal PA Pressure, mild LAE and mild MR.   LHC done 01/09/17 and showed:  Prox Cx to Mid Cx lesion, 80 %stenosed.  Ost 1st Mrg to 1st Mrg lesion, 60 %stenosed.  Mid Cx lesion, 100 %stenosed.  Ost LAD to Prox LAD lesion, 95 %stenosed.  Mid LAD lesion, 60 %stenosed.  Dist LAD-2 lesion, 60 %stenosed.  Dist LAD-1 lesion, 90 %stenosed.  Ost 2nd Mrg to 2nd Mrg lesion, 90 %stenosed.  Prox RCA to Mid RCA lesion, 99 %stenosed.  The left ventricular systolic function is normal.  LV end diastolic pressure is normal.  The left ventricular ejection fraction is 55-65% by visual estimate.   1. 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 2. Normal LV systolic function and high normal left ventricular end-diastolic pressure.  Was in the ED 09/19/20 due to cough and mild dyspnea. CT angio completed without evidence of mass, PE or acute infiltrate. Released.    He presents today for his initial visit with a chief complaint of minimal shortness of breath with moderate exertion. He describes this as having been present for several years with varying levels of severity. He has associated cough, intermittent dizziness, pedal edema (resolves overnight) and numbness in fingertips. He denies any difficulty sleeping, abdominal distention, palpitations, chest pain, fatigue or weight gain.   He did notice that when he took the furosemide, that his swelling was less. Tries to monitor sodium intake but did eat bacon this morning.   Past Medical History:  Diagnosis Date  . Arthritis    "hands, knees" (01/09/2017)  . CAD (coronary artery disease)    a. 12/2016  Cath: LM nl, LAD 95p, 41m, 60/90d, LCX 80p/m, 1110m, OM1 41m, OM2 90, RCA 99p, Ef 55-65%; b. 12/2016 CABG x 4 (LIMA->mLAD, VG->dLAD, VG->OM1->OM2); c. 11/2018 MV: EF 66%, no ischemia.  . Essential hypertension   . High cholesterol   . History of echocardiogram    a. 12/2016 Echo: EF 65-70%.  . Wears dentures    partial upper   Past Surgical History:  Procedure Laterality Date  . CARDIAC CATHETERIZATION  01/09/2017   "scheduling me for OHS"  . CATARACT EXTRACTION W/PHACO Left 06/12/2020   Procedure: CATARACT EXTRACTION PHACO AND INTRAOCULAR LENS PLACEMENT (IOC) LEFT 4.36 00:51.9 8.4%;  Surgeon: Lockie Mola, MD;  Location: [facility];  Service: Ophthalmology;  Laterality: Left;  . COLONOSCOPY WITH PROPOFOL N/A 11/09/2016   Procedure: COLONOSCOPY WITH PROPOFOL;  Surgeon: Earline Mayotte, MD;  Location: [facility] ENDOSCOPY;  Service: Endoscopy;  Laterality: N/A;  . COLONOSCOPY WITH PROPOFOL N/A 11/29/2019   Procedure: COLONOSCOPY WITH PROPOFOL;  Surgeon: Earline Mayotte, MD;  Location: [facility] ENDOSCOPY;  Service: Endoscopy;  Laterality: N/A;  . CORONARY ARTERY BYPASS GRAFT N/A 01/12/2017   CABG x 4: LIMA to mLAD, SVG to dLAD, sequential SVG to OM1-OM2, EVH via left thigh and leg;  Surgeon: Purcell Nails, MD;  Location: [facility] OR;  Service: Open Heart Surgery;  Laterality: N/A;  . INTRAOPERATIVE TRANSESOPHAGEAL ECHOCARDIOGRAM N/A 01/12/2017   Procedure: INTRAOPERATIVE TRANSESOPHAGEAL ECHOCARDIOGRAM;  Surgeon: Purcell Nails, MD;  Location: [facility] OR;  Service: Open Heart Surgery;  Laterality: N/A;  . LEFT HEART CATH AND CORONARY ANGIOGRAPHY Left 01/09/2017   Procedure: Left Heart Cath and Coronary Angiography;  Surgeon: Iran Ouch, MD;  Location: [facility] INVASIVE CV LAB;  Service: Cardiovascular;  Laterality: Left;   Family History  Problem Relation Age of Onset  . Heart attack Mother   . Colon cancer Brother    Social History   Tobacco Use  . Smoking status: Never Smoker  .  Smokeless tobacco: Never Used  Substance Use Topics  . Alcohol use: No   No Known Allergies Prior to Admission medications   Medication Sig Start Date End Date Taking? Authorizing Provider  amLODipine (NORVASC) 10 MG tablet Take 1 tablet (10 mg total) by mouth daily. 09/09/20  Yes Creig Hines, NP  atorvastatin (LIPITOR) 40 MG tablet Take 40 mg by mouth daily.   Yes [provider]  carvedilol (COREG) 25 MG tablet Take 1 tablet (25 mg total) by mouth 2 (two) times daily. 07/09/20  Yes Creig Hines, NP  dextromethorphan-guaiFENesin [facility] DM) 30-600 MG 12hr tablet Take 1 tablet by mouth 2 (two) times daily.   Yes [provider]  GNP ASPIRIN 81 MG EC tablet TAKE 1 TABLET EVERY DAY 01/23/20  Yes Dunn, Raymon Mutton, PA-C  isosorbide mononitrate (IMDUR) 60 MG 24 hr tablet Take 1 tablet (60 mg total) by mouth daily. 08/25/20 08/20/21 Yes Creig Hines, NP  loratadine (CLARITIN) 10 MG tablet Take 10 mg by mouth daily.   Yes [provider]  meloxicam (MOBIC) 15 MG tablet Take 15 mg by mouth every other day. 10/05/18  Yes [provider]  moexipril (UNIVASC) 15 MG tablet Take 15 mg by mouth at bedtime.  10/24/18  Yes [provider]  pantoprazole (PROTONIX) 40 MG tablet TAKE 1 TABLET EVERY DAY 06/01/20  Yes Iran Ouch, MD  furosemide (LASIX) 20 MG tablet One tablet daily for 5 days  09/21/20   Alver Sorrow, NP   Review of Systems  Constitutional: Negative for appetite change and fatigue.  HENT: Negative for congestion, postnasal drip and sore throat.   Eyes: Negative.   Respiratory: Positive for cough (productive at times) and shortness of breath (with moderate exertion).   Cardiovascular: Positive for leg swelling (at times; resolves overnight). Negative for chest pain and palpitations.  Gastrointestinal: Negative for abdominal distention and abdominal pain.  Endocrine: Negative.   Genitourinary: Negative.    Musculoskeletal: Positive for arthralgias (knee pain). Negative for back pain.  Skin: Negative.   Allergic/Immunologic: Negative.   Neurological: Positive for dizziness (with elevated BP) and numbness (in fingertips).  Hematological: Negative for adenopathy. Does not bruise/bleed easily.  Psychiatric/Behavioral: Negative for dysphoric mood and sleep disturbance (sleeping on 1 pillow). The patient is not nervous/anxious.    Vitals:   10/23/20 1010 10/23/20 1046  BP: (!) 173/87 (!) 160/80  Pulse: 70   Resp: 18   SpO2: 97%   Weight: 226 lb (102.5 kg)   Height: 6' (1.829 m)    Wt Readings from Last 3 Encounters:  10/23/20 226 lb (102.5 kg)  09/21/20 230 lb (104.3 kg)  06/12/20 237 lb (107.5 kg)   Lab Results  Component Value Date   CREATININE 1.22 09/19/2020   CREATININE 1.24 12/06/2018   CREATININE 1.31 (H) 01/17/2017   Physical Exam Vitals and nursing note reviewed.  Constitutional:      Appearance: Normal appearance.  HENT:     Head: Normocephalic and atraumatic.  Cardiovascular:  Rate and Rhythm: Normal rate and regular rhythm.  Pulmonary:     Effort: Pulmonary effort is normal. No respiratory distress.     Breath sounds: No wheezing or rales.  Abdominal:     General: There is no distension.     Palpations: Abdomen is soft.  Musculoskeletal:        General: No tenderness.     Cervical back: Normal range of motion and neck supple.     Right lower leg: Edema (1+ pitting) present.     Left lower leg: Edema (1+ pitting) present.  Skin:    General: Skin is warm and dry.  Neurological:     General: No focal deficit present.     Mental Status: He is alert and oriented to person, place, and time.  Psychiatric:        Mood and Affect: Mood normal.        Behavior: Behavior normal.        Thought Content: Thought content normal.    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 (mild LVH)- - NYHA class II -  euvolemic today - weighing daily and says that his weight ranges from 224-225 pounds; reminded to call for an overnight weight gain of > 2 pounds or a weekly weight gain of > 5 pounds - not adding salt and has been reading food labels for sodium content; reviewed the importance of closely following a 2000mg  sodium diet and low sodium cookbook was given to patient - occasionally eats salty food such as bacon this morning; was aware that it was high in salt - saw cardiology ) 09/21/20 - BNP 09/19/20 was 211.7 - encouraged to elevate his legs when he gets home from work; he says that the edema resolves overnight - PharmD reconciled medications with the patient  2: HTN- - BP elevated even after recheck with manual cuff - will add furosemide 20mg  daily; cardiology sees next week and can check BMP at that time - sees PCP 09/21/20) and returns June 2022 - BMP 09/19/20 reviewed and showed sodium 133, potassium 4.0, creatinine 1.22 and GFR >60  3: CAD- - has had quadruple CABG years ago - on aspirin and atorvastatin    Medication bottles reviewed.   Return in 2 months or sooner for any questions/problems before then.

## 2020-10-23 NOTE — Patient Instructions (Addendum)
Pt will begin using Voltaren (diclofenac) gel to reduce use of meloxicam.   Continue weighing daily and call for an overnight weight gain of > 2 pounds or a weekly weight gain of >5 pounds.

## 2020-10-29 ENCOUNTER — Ambulatory Visit: Payer: Medicare HMO | Admitting: Cardiovascular Disease

## 2020-10-29 ENCOUNTER — Other Ambulatory Visit: Payer: Self-pay

## 2020-10-29 ENCOUNTER — Encounter: Payer: Self-pay | Admitting: Cardiovascular Disease

## 2020-10-29 VITALS — BP 140/70 | HR 65 | Ht 72.0 in | Wt 231.0 lb

## 2020-10-29 DIAGNOSIS — R0989 Other specified symptoms and signs involving the circulatory and respiratory systems: Secondary | ICD-10-CM

## 2020-10-29 DIAGNOSIS — E785 Hyperlipidemia, unspecified: Secondary | ICD-10-CM | POA: Diagnosis not present

## 2020-10-29 DIAGNOSIS — I10 Essential (primary) hypertension: Secondary | ICD-10-CM | POA: Diagnosis not present

## 2020-10-29 DIAGNOSIS — I2510 Atherosclerotic heart disease of native coronary artery without angina pectoris: Secondary | ICD-10-CM

## 2020-10-29 NOTE — Patient Instructions (Signed)

## 2020-10-29 NOTE — Progress Notes (Signed)
Cardiology Office Note   Date:  10/29/2020   ID:  Dale Cook., DOB 05/24/1946, MRN [id number]  PCP:  [name], MD  Cardiologist:   [name], MD   Chief Complaint  Patient presents with  . other    6 month follow up. Meds reviewed verbally with patient.       History of Present Illness: Dale Cook. is a 75 y.o. male who is here today for follow-up visit regarding coronary artery disease status post CABG in July 2018 for severe three-vessel coronary artery disease after presenting with unstable angina.  Echocardiogram showed normal LV systolic function with no significant valvular abnormalities.  Carotid Doppler before surgery showed no significant disease. He has known history of hypertension and hyperlipidemia. 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  He was hospitalized in June 2020 with chest pain in the setting of hypertensive urgency.  He ruled out for myocardial infarction.  Lexiscan Myoview was negative for ischemia.  Additional antihypertensive medications were added with subsequent improvement.   He presented to the ED in March with cough of 3 months duration.  Labs showed mildly elevated BNP at 211.  COVID testing was negative.  He was started on 4-day course of Lasix 20 mg daily and was given Tessalon for cough.  Echocardiogram was done in April which showed normal LV systolic function, grade 1 diastolic dysfunction, normal pulmonary pressure, mild mitral regurgitation and mild aortic regurgitation.  He was seen in the heart failure clinic recently and furosemide 20 mg daily was resumed given dyspnea and leg edema.  He reports feeling better when he takes furosemide.  No chest pain.  He might need knee replacement in the near future.  Past Medical History:  Diagnosis Date  . Arthritis    "hands, knees" (01/09/2017)  . CAD (coronary artery disease)    a. 12/2016 Cath: LM nl, LAD 95p, 23m, 60/90d, LCX 80p/m, 156m, OM1 6m, OM2 90, RCA  99p, Ef 55-65%; b. 12/2016 CABG x 4 (LIMA->mLAD, VG->dLAD, VG->OM1->OM2); c. 11/2018 MV: EF 66%, no ischemia.  . CHF (congestive heart failure) (HCC)   . Essential hypertension   . High cholesterol   . History of echocardiogram    a. 12/2016 Echo: EF 65-70%.  . Wears dentures    partial upper    Past Surgical History:  Procedure Laterality Date  . CARDIAC CATHETERIZATION  01/09/2017   "scheduling me for OHS"  . CATARACT EXTRACTION W/PHACO Left 06/12/2020   Procedure: CATARACT EXTRACTION PHACO AND INTRAOCULAR LENS PLACEMENT (IOC) LEFT 4.36 00:51.9 8.4%;  Surgeon: Lockie Mola, MD;  Location[facility];  Service: Ophthalmology;  Laterality: Left;  . COLONOSCOPY WITH PROPOFOL N/A 11/09/2016   Procedure: COLONOSCOPY WITH PROPOFOL;  Surgeon: Earline Mayotte, MD;  Location: [facility] ENDOSCOPY;  Service: Endoscopy;  Laterality: N/A;  . COLONOSCOPY WITH PROPOFOL N/A 11/29/2019   Procedure: COLONOSCOPY WITH PROPOFOL;  Surgeon: Earline Mayotte, MD;  Location: [facility] ENDOSCOPY;  Service: Endoscopy;  Laterality: N/A;  . CORONARY ARTERY BYPASS GRAFT N/A 01/12/2017   CABG x 4: LIMA to mLAD, SVG to dLAD, sequential SVG to OM1-OM2, EVH via left thigh and leg;  Surgeon: Purcell Nails, MD;  Location: [facility] OR;  Service: Open Heart Surgery;  Laterality: N/A;  . INTRAOPERATIVE TRANSESOPHAGEAL ECHOCARDIOGRAM N/A 01/12/2017   Procedure: INTRAOPERATIVE TRANSESOPHAGEAL ECHOCARDIOGRAM;  Surgeon: Purcell Nails, MD;  Location: [facility] OR;  Service: Open Heart Surgery;  Laterality: N/A;  . LEFT HEART CATH  AND CORONARY ANGIOGRAPHY Left 01/09/2017   Procedure: Left Heart Cath and Coronary Angiography;  Surgeon: Iran Ouch, MD;  Location: [facility] INVASIVE CV LAB;  Service: Cardiovascular;  Laterality: Left;     Current Outpatient Medications  Medication Sig Dispense Refill  . amLODipine (NORVASC) 10 MG tablet Take 1 tablet (10 mg total) by mouth daily. 90 tablet 0  . atorvastatin (LIPITOR) 40 MG tablet  Take 40 mg by mouth daily.    . carvedilol (COREG) 25 MG tablet Take 1 tablet (25 mg total) by mouth 2 (two) times daily. 180 tablet 2  . dextromethorphan-guaiFENesin (MUCINEX DM) 30-600 MG 12hr tablet Take 1 tablet by mouth 2 (two) times daily.    . furosemide (LASIX) 20 MG tablet Take 1 tablet (20 mg total) by mouth daily. 30 tablet 5  . GNP ASPIRIN 81 MG EC tablet TAKE 1 TABLET EVERY DAY 90 tablet 0  . isosorbide mononitrate (IMDUR) 60 MG 24 hr tablet Take 1 tablet (60 mg total) by mouth daily. 90 tablet 0  . loratadine (CLARITIN) 10 MG tablet Take 10 mg by mouth daily.    . meloxicam (MOBIC) 15 MG tablet Take 15 mg by mouth every other day.    . moexipril (UNIVASC) 15 MG tablet Take 15 mg by mouth at bedtime.     . pantoprazole (PROTONIX) 40 MG tablet TAKE 1 TABLET EVERY DAY 90 tablet 2   No current facility-administered medications for this visit.    Allergies:   Patient has no known allergies.    Social History:  The patient  reports that he has never smoked. He has never used smokeless tobacco. He reports that he does not drink alcohol and does not use drugs.   Family History:  The patient's family history includes Colon cancer in his brother; Heart attack in his mother.    ROS:  Please see the history of present illness.   Otherwise, review of systems are positive for none.   All other systems are reviewed and negative.    PHYSICAL EXAM: VS:  BP 140/70 (BP Location: Left Arm, Patient Position: Sitting, Cuff Size: Normal)   Pulse 65   Ht 6' (1.829 m)   Wt 231 lb (104.8 kg)   SpO2 95%   BMI 31.33 kg/m  , BMI Body mass index is 31.33 kg/m. GEN: Well nourished, well developed, in no acute distress  HEENT: normal  Neck: no JVD,  or masses. Left carotid bruit Cardiac: RRR; no  rubs, or gallops, trace edema .  2/6 systolic ejection murmur in the aortic area which is early peaking Respiratory:  clear to auscultation bilaterally, normal work of breathing GI: soft, nontender,  nondistended, + BS MS: no deformity or atrophy  Skin: warm and dry, no rash Neuro:  Strength and sensation are intact Psych: euthymic mood, full affect   EKG:  EKG is ordered today. The ekg ordered today demonstrates normal sinus rhythm with a PVC and possible old inferior infarct.   Recent Labs: 09/19/2020: ALT 15; B Natriuretic Peptide 211.7; BUN 15; Creatinine, Ser 1.22; Hemoglobin 13.0; Platelets 219; Potassium 4.0; Sodium 133    Lipid Panel    Component Value Date/Time   CHOL 112 03/17/2017 0904   TRIG 70 03/17/2017 0904   HDL 31 (L) 03/17/2017 0904   CHOLHDL 3.6 03/17/2017 0904   VLDL 14 03/17/2017 0904   LDLCALC 67 03/17/2017 0904      Wt Readings from Last 3 Encounters:  10/29/20 231 lb (104.8 kg)  10/23/20 226 lb (102.5 kg)  09/21/20 230 lb (104.3 kg)      PAD Screen 01/05/2017  Previous PAD dx? No  Previous surgical procedure? No  Pain with walking? Yes  Subsides with rest? Yes  Feet/toe relief with dangling? No  Painful, non-healing ulcers? No  Extremities discolored? No      ASSESSMENT AND PLAN:  1.   Coronary artery disease involving native coronary arteries without angina: He is doing very well with no anginal symptoms.  Continue medical therapy.    2. Left carotid bruit: Carotid Doppler before CABG showed no significant disease.  3. Hyperlipidemia: Continue treatment with atorvastatin.  His LDL has been below 70 and is managed by his primary care physician.  4.  Essential hypertension: Blood pressure is reasonably controlled on current medications.  5.  Chronic diastolic heart failure: He seems to have improved with small dose furosemide.  His BNP was mildly elevated.  He appears to be euvolemic at the present time.    Disposition:   FU with me in 6 month  Signed,  [name], MD  10/29/2020 4:24 PM    [facility]

## 2020-11-02 ENCOUNTER — Telehealth: Payer: Self-pay | Admitting: Cardiovascular Disease

## 2020-11-02 NOTE — Telephone Encounter (Signed)
   Name: Dale Cook.  DOB: 06/25/1946  MRN: [id number]   Primary Cardiologist: [name], MD  Chart reviewed as part of pre-operative protocol coverage. Patient was contacted 11/02/2020 in reference to pre-operative risk assessment for pending surgery as outlined below.  Dale Nickoli Bagheri. was last seen on 10/29/20 by Dr. Kirke Corin.  Since that day, Dale Peasley. has done well. He has an exercise tolerance of >4 METS and no anginal symptoms.  Therefore, based on ACC/AHA guidelines, the patient would be at acceptable risk for the planned procedure without further cardiovascular testing.   The patient was advised that if he develops new symptoms prior to surgery to contact our office to arrange for a follow-up visit, and he verbalized understanding.  I will route this recommendation to the requesting party via Epic fax function and remove from pre-op pool. Please call with questions.  Alver Sorrow, NP 11/02/2020, 3:07 PM

## 2020-11-02 NOTE — Telephone Encounter (Signed)
   [facility] Pre-operative Risk Assessment    Patient Name: Ayce Pietrzyk.  DOB: Apr 08, 1946  MRN: [id number]   HEARTCARE STAFF: - Please ensure there is not already an duplicate clearance open for this procedure. - Under Visit Info/Reason for Call, type in Other and utilize the format Clearance MM/DD/YY or Clearance TBD. Do not use dashes or single digits. - If request is for dental extraction, please clarify the # of teeth to be extracted.  Request for surgical clearance:  1. What type of surgery is being performed? R TKA traditional knee  2. When is this surgery scheduled? 01/18/21  3. What type of clearance is required (medical clearance vs. Pharmacy clearance to hold med vs. Both)? Not noted  4. Are there any medications that need to be held prior to surgery and how long? Not noted  5. Practice name and name of physician performing surgery? [facility]man  6. What is the office phone number? [phone] ext [phone]   7.   What is the office fax number? [phone] attn Elizabeth  8.   Anesthesia type (None, local, MAC, general) ? spinal   Marykay Lex 11/02/2020, 10:55 AM  _________________________________________________________________   (provider comments below)

## 2020-11-02 NOTE — Telephone Encounter (Signed)
   Name: Dale Cook.  DOB: January 27, 1946  MRN: [id number]   Primary Cardiologist: [name], MD  Chart reviewed as part of pre-operative protocol coverage.  Left VM requesting call back.   Alver Sorrow, NP 11/02/2020, 1:17 PM

## 2020-11-05 ENCOUNTER — Other Ambulatory Visit: Payer: Self-pay

## 2020-11-05 MED ORDER — ISOSORBIDE MONONITRATE ER 60 MG PO TB24: 60.0000 mg | ORAL_TABLET | Freq: Every day | ORAL | 1 refills | Status: DC

## 2020-11-17 ENCOUNTER — Other Ambulatory Visit: Payer: Self-pay | Admitting: *Deleted

## 2020-11-17 MED ORDER — AMLODIPINE BESYLATE 10 MG PO TABS: 10.0000 mg | ORAL_TABLET | Freq: Every day | ORAL | 1 refills | Status: DC

## 2020-11-17 NOTE — Telephone Encounter (Signed)
Received a fax request from pharmacy for Amlodipine 10 mg. Rx sent to [facility] mail order pharmacy for #[id number] and RF>

## 2020-12-22 NOTE — Progress Notes (Signed)
Patient ID: Dale Cook., male    DOB: 1945-10-24, 75 y.o.   MRN: [id number]  HPI  Dale Cook is a 75 y/o male with a history of CAD, hyperlipidemia, HTN, arthritis and chronic heart failure.   Echo report from 10/15/20 reviewed and showed an EF of 60-65% along with normal PA Pressure, mild LAE and mild Dale.   LHC done 01/09/17 and showed: Prox Cx to Mid Cx lesion, 80 %stenosed. Ost 1st Mrg to 1st Mrg lesion, 60 %stenosed. Mid Cx lesion, 100 %stenosed. Ost LAD to Prox LAD lesion, 95 %stenosed. Mid LAD lesion, 60 %stenosed. Dist LAD-2 lesion, 60 %stenosed. Dist LAD-1 lesion, 90 %stenosed. Ost 2nd Mrg to 2nd Mrg lesion, 90 %stenosed. Prox RCA to Mid RCA lesion, 99 %stenosed. The left ventricular systolic function is normal. LV end diastolic pressure is normal. The left ventricular ejection fraction is 55-65% by visual estimate.   1. 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 2. Normal LV systolic function and high normal left ventricular end-diastolic pressure.  Was in the ED 09/19/20 due to cough and mild dyspnea. CT angio completed without evidence of mass, PE or acute infiltrate. Released.    He presents today for a follow-up visit with a chief complaint of minimal shortness of breath upon moderate exertion. He describes this as chronic in nature having been present for several years. He has associated cough, slight pedal edema, dizziness and knee pain along with this. He denies any difficulty sleeping, abdominal distention, palpitations, chest pain, fatigue or weight gain.   Has upcoming right knee replacement surgery scheduled with plan to then have the left knee replaced.   Past Medical History:  Diagnosis Date   Arthritis    "hands, knees" (01/09/2017)   CAD (coronary artery disease)    a. 12/2016 Cath: LM nl, LAD 95p, 20m, 60/90d, LCX 80p/m, 162m, OM1 52m, OM2 90, RCA 99p, Ef 55-65%; b. 12/2016 CABG  x 4 (LIMA->mLAD, VG->dLAD, VG->OM1->OM2); c. 11/2018 MV: EF 66%, no ischemia.   CHF (congestive heart failure) (HCC)    Essential hypertension    High cholesterol    History of echocardiogram    a. 12/2016 Echo: EF 65-70%.   Wears dentures    partial upper   Past Surgical History:  Procedure Laterality Date   CARDIAC CATHETERIZATION  01/09/2017   "scheduling me for OHS"   CATARACT EXTRACTION W/PHACO Left 06/12/2020   Procedure: CATARACT EXTRACTION PHACO AND INTRAOCULAR LENS PLACEMENT (IOC) LEFT 4.36 00:51.9 8.4%;  Surgeon: Lockie Mola, MD;  Location: [facility];  Service: Ophthalmology;  Laterality: Left;   COLONOSCOPY WITH PROPOFOL N/A 11/09/2016   Procedure: COLONOSCOPY WITH PROPOFOL;  Surgeon: Earline Mayotte, MD;  Location: [facility] ENDOSCOPY;  Service: Endoscopy;  Laterality: N/A;   COLONOSCOPY WITH PROPOFOL N/A 11/29/2019   Procedure: COLONOSCOPY WITH PROPOFOL;  Surgeon: Earline Mayotte, MD;  Location: [facility] ENDOSCOPY;  Service: Endoscopy;  Laterality: N/A;   CORONARY ARTERY BYPASS GRAFT N/A 01/12/2017   CABG x 4: LIMA to mLAD, SVG to dLAD, sequential SVG to OM1-OM2, EVH via left thigh and leg;  Surgeon: Purcell Nails, MD;  Location: [facility] OR;  Service: Open Heart Surgery;  Laterality: N/A;   INTRAOPERATIVE TRANSESOPHAGEAL ECHOCARDIOGRAM N/A 01/12/2017   Procedure: INTRAOPERATIVE TRANSESOPHAGEAL ECHOCARDIOGRAM;  Surgeon: Purcell Nails, MD;  Location: [facility] OR;  Service: Open Heart Surgery;  Laterality: N/A;   LEFT HEART CATH AND CORONARY ANGIOGRAPHY Left 01/09/2017   Procedure:  Left Heart Cath and Coronary Angiography;  Surgeon: Iran Ouch, MD;  Location: [facility] INVASIVE CV LAB;  Service: Cardiovascular;  Laterality: Left;   Family History  Problem Relation Age of Onset   Heart attack Mother    Colon cancer Brother    Social History   Tobacco Use   Smoking status: Never   Smokeless tobacco: Never  Substance Use Topics   Alcohol use: No   No Known  Allergies  Prior to Admission medications   Medication Sig Start Date End Date Taking? Authorizing Provider  amLODipine (NORVASC) 10 MG tablet Take 1 tablet (10 mg total) by mouth daily. 11/17/20  Yes Creig Hines, NP  atorvastatin (LIPITOR) 40 MG tablet Take 40 mg by mouth daily.   Yes [provider]  carvedilol (COREG) 25 MG tablet Take 1 tablet (25 mg total) by mouth 2 (two) times daily. 07/09/20  Yes Creig Hines, NP  dextromethorphan-guaiFENesin [facility] DM) 30-600 MG 12hr tablet Take 1 tablet by mouth 2 (two) times daily.   Yes [provider]  furosemide (LASIX) 20 MG tablet Take 1 tablet (20 mg total) by mouth daily. 12/23/20  Yes Delma Freeze, FNP  GNP ASPIRIN 81 MG EC tablet TAKE 1 TABLET EVERY DAY 01/23/20  Yes Dunn, Raymon Mutton, PA-C  isosorbide mononitrate (IMDUR) 60 MG 24 hr tablet Take 1 tablet (60 mg total) by mouth daily. 11/05/20 10/31/21 Yes Iran Ouch, MD  loratadine (CLARITIN) 10 MG tablet Take 10 mg by mouth daily.   Yes [provider]  meloxicam (MOBIC) 15 MG tablet Take 15 mg by mouth every other day. 10/05/18  Yes [provider]  moexipril (UNIVASC) 15 MG tablet Take 15 mg by mouth at bedtime.  10/24/18  Yes [provider]  pantoprazole (PROTONIX) 40 MG tablet TAKE 1 TABLET EVERY DAY 06/01/20  Yes Iran Ouch, MD     Review of Systems  Constitutional:  Negative for appetite change and fatigue.  HENT:  Negative for congestion, postnasal drip and sore throat.   Eyes: Negative.   Respiratory:  Positive for cough (productive at times) and shortness of breath (with moderate exertion).   Cardiovascular:  Positive for leg swelling (at times; resolves overnight). Negative for chest pain and palpitations.  Gastrointestinal:  Negative for abdominal distention and abdominal pain.  Endocrine: Negative.   Genitourinary: Negative.   Musculoskeletal:  Positive for arthralgias (knee pain). Negative for  back pain.  Skin: Negative.   Allergic/Immunologic: Negative.   Neurological:  Positive for dizziness (with elevated BP) and numbness (in fingertips).  Hematological:  Negative for adenopathy. Does not bruise/bleed easily.  Psychiatric/Behavioral:  Negative for dysphoric mood and sleep disturbance (sleeping on 1 pillow). The patient is not nervous/anxious.    Vitals:   12/23/20 1356  BP: (!) 143/72  Pulse: 73  Resp: 20  Temp: 98.4 F (36.9 C)  SpO2: 98%  Weight: 236 lb (107 kg)  Height: 6' (1.829 m)   Wt Readings from Last 3 Encounters:  12/23/20 236 lb (107 kg)  10/29/20 231 lb (104.8 kg)  10/23/20 226 lb (102.5 kg)   Lab Results  Component Value Date   CREATININE 1.22 09/19/2020   CREATININE 1.24 12/06/2018   CREATININE 1.31 (H) 01/17/2017    Physical Exam Vitals and nursing note reviewed.  Constitutional:      Appearance: Normal appearance.  HENT:     Head: Normocephalic and atraumatic.  Cardiovascular:     Rate and Rhythm: Normal  rate and regular rhythm.  Pulmonary:     Effort: Pulmonary effort is normal. No respiratory distress.     Breath sounds: No wheezing or rales.  Abdominal:     General: There is no distension.     Palpations: Abdomen is soft.  Musculoskeletal:        General: No tenderness.     Cervical back: Normal range of motion and neck supple.     Right lower leg: Edema (trace pitting) present.     Left lower leg: Edema (trace pitting) present.  Skin:    General: Skin is warm and dry.  Neurological:     General: No focal deficit present.     Mental Status: He is alert and oriented to person, place, and time.  Psychiatric:        Mood and Affect: Mood normal.        Behavior: Behavior normal.        Thought Content: Thought content normal.   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 (mild LVH)- - NYHA class II - euvolemic today - weighing daily; reminded to call for an overnight  weight gain of > 2 pounds or a weekly weight gain of > 5 pounds - weight up 10 pounds from last visit 2 months ago; encouraged to increase his activity and monitor caloric intake - not adding salt and has been reading food labels for sodium content; reviewed the importance of closely following a 2000mg  sodium diet  - saw cardiology ) 10/29/20 - BNP 09/19/20 was 211.7  2: HTN- - BP mildly elevated - sees PCP 09/21/20) - BMP 09/19/20 reviewed and showed sodium 133, potassium 4.0, creatinine 1.22 and GFR >60  3: CAD- - has had quadruple CABG years ago - on aspirin and atorvastatin    Medication bottles reviewed.   Due to HF stability, will not make a return appointment for patient at this time. Advised him to continue close follow-up with cardiology but that he could call back at anytime for questions or to schedule another appointment and he was comfortable with this plan.

## 2020-12-23 ENCOUNTER — Other Ambulatory Visit: Payer: Self-pay

## 2020-12-23 ENCOUNTER — Ambulatory Visit: Payer: Medicare HMO | Attending: Family | Admitting: Family

## 2020-12-23 ENCOUNTER — Encounter: Payer: Self-pay | Admitting: Family

## 2020-12-23 VITALS — BP 143/72 | HR 73 | Temp 98.4°F | Resp 20 | Ht 72.0 in | Wt 236.0 lb

## 2020-12-23 DIAGNOSIS — I5022 Chronic systolic (congestive) heart failure: Secondary | ICD-10-CM | POA: Insufficient documentation

## 2020-12-23 DIAGNOSIS — R0602 Shortness of breath: Secondary | ICD-10-CM | POA: Diagnosis not present

## 2020-12-23 DIAGNOSIS — I110 Hypertensive heart disease with heart failure: Secondary | ICD-10-CM | POA: Insufficient documentation

## 2020-12-23 DIAGNOSIS — R059 Cough, unspecified: Secondary | ICD-10-CM | POA: Insufficient documentation

## 2020-12-23 DIAGNOSIS — Z79899 Other long term (current) drug therapy: Secondary | ICD-10-CM | POA: Insufficient documentation

## 2020-12-23 DIAGNOSIS — R600 Localized edema: Secondary | ICD-10-CM | POA: Diagnosis not present

## 2020-12-23 DIAGNOSIS — Z951 Presence of aortocoronary bypass graft: Secondary | ICD-10-CM | POA: Diagnosis not present

## 2020-12-23 DIAGNOSIS — I10 Essential (primary) hypertension: Secondary | ICD-10-CM

## 2020-12-23 DIAGNOSIS — I5032 Chronic diastolic (congestive) heart failure: Secondary | ICD-10-CM

## 2020-12-23 DIAGNOSIS — Z791 Long term (current) use of non-steroidal anti-inflammatories (NSAID): Secondary | ICD-10-CM | POA: Diagnosis not present

## 2020-12-23 DIAGNOSIS — Z7982 Long term (current) use of aspirin: Secondary | ICD-10-CM | POA: Insufficient documentation

## 2020-12-23 DIAGNOSIS — M25569 Pain in unspecified knee: Secondary | ICD-10-CM | POA: Insufficient documentation

## 2020-12-23 DIAGNOSIS — R42 Dizziness and giddiness: Secondary | ICD-10-CM | POA: Diagnosis not present

## 2020-12-23 DIAGNOSIS — I2510 Atherosclerotic heart disease of native coronary artery without angina pectoris: Secondary | ICD-10-CM | POA: Diagnosis not present

## 2020-12-23 DIAGNOSIS — Z8249 Family history of ischemic heart disease and other diseases of the circulatory system: Secondary | ICD-10-CM | POA: Diagnosis not present

## 2020-12-23 MED ORDER — FUROSEMIDE 20 MG PO TABS: 20.0000 mg | ORAL_TABLET | Freq: Every day | ORAL | 3 refills | Status: DC

## 2020-12-23 NOTE — Patient Instructions (Addendum)
Continue weighing daily and call for an overnight weight gain of > 2 pounds or a weekly weight gain of >5 pounds.    Call us in the future for any questions or if you want to schedule another appointment

## 2021-01-08 ENCOUNTER — Other Ambulatory Visit: Payer: Self-pay | Admitting: Orthopedic Surgery

## 2021-01-08 ENCOUNTER — Encounter
Admission: RE | Admit: 2021-01-08 | Discharge: 2021-01-08 | Disposition: A | Discharge status: Home / Self Care | Payer: Medicare HMO | Source: Ambulatory Visit | Attending: Orthopedic Surgery | Admitting: Orthopedic Surgery

## 2021-01-08 ENCOUNTER — Other Ambulatory Visit: Payer: Self-pay

## 2021-01-08 ENCOUNTER — Encounter: Payer: Self-pay | Admitting: Orthopedic Surgery

## 2021-01-08 DIAGNOSIS — Z01812 Encounter for preprocedural laboratory examination: Secondary | ICD-10-CM | POA: Insufficient documentation

## 2021-01-08 LAB — URINALYSIS, ROUTINE W REFLEX MICROSCOPIC
Bilirubin Urine: NEGATIVE
Glucose, UA: NEGATIVE mg/dL
Hgb urine dipstick: NEGATIVE
Ketones, ur: NEGATIVE mg/dL
Leukocytes,Ua: NEGATIVE
Nitrite: NEGATIVE
Protein, ur: NEGATIVE mg/dL
Specific Gravity, Urine: 1.014 (ref 1.005–1.030)
pH: 5 (ref 5.0–8.0)

## 2021-01-08 LAB — TYPE AND SCREEN
ABO/RH(D): A POS
Antibody Screen: NEGATIVE

## 2021-01-08 LAB — CBC
HCT: 42.7 % (ref 39.0–52.0)
Hemoglobin: 14.3 g/dL (ref 13.0–17.0)
MCH: 28.1 pg (ref 26.0–34.0)
MCHC: 33.5 g/dL (ref 30.0–36.0)
MCV: 84.1 fL (ref 80.0–100.0)
Platelets: 193 10*3/uL (ref 150–400)
RBC: 5.08 MIL/uL (ref 4.22–5.81)
RDW: 14.7 % (ref 11.5–15.5)
WBC: 8.2 10*3/uL (ref 4.0–10.5)
nRBC: 0 % (ref 0.0–0.2)

## 2021-01-08 LAB — BASIC METABOLIC PANEL
Anion gap: 3 — ABNORMAL LOW (ref 5–15)
BUN: 19 mg/dL (ref 8–23)
CO2: 29 mmol/L (ref 22–32)
Calcium: 8.7 mg/dL — ABNORMAL LOW (ref 8.9–10.3)
Chloride: 105 mmol/L (ref 98–111)
Creatinine, Ser: 1.59 mg/dL — ABNORMAL HIGH (ref 0.61–1.24)
GFR, Estimated: 46 mL/min — ABNORMAL LOW (ref >60)
Glucose, Bld: 111 mg/dL — ABNORMAL HIGH (ref 70–99)
Potassium: 3.9 mmol/L (ref 3.5–5.1)
Sodium: 137 mmol/L (ref 135–145)

## 2021-01-08 LAB — PROTIME-INR
INR: 1.1 (ref 0.8–1.2)
Prothrombin Time: 13.9 seconds (ref 11.4–15.2)

## 2021-01-08 LAB — SURGICAL PCR SCREEN
MRSA, PCR: NEGATIVE
Staphylococcus aureus: NEGATIVE

## 2021-01-08 LAB — APTT: aPTT: 31 seconds (ref 24–36)

## 2021-01-08 NOTE — H&P (Signed)
Dale Cook. MRN:  [id number] DOB/SEX:  [birth year]/male  CHIEF COMPLAINT:  Painful right Knee  HISTORY: Patient is a 75 y.o. male presented with a history of pain in the right knee. Onset of symptoms was gradual starting several years ago with gradually worsening course since that time. Prior procedures on the knee include none. Patient has been treated conservatively with over-the-counter NSAIDs and activity modification. Patient currently rates pain in the knee at 10 out of 10 with activity. There is pain at night.  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 12/2016 Cath: LM nl, LAD 95p, 43m, 60/90d, LCX 80p/m, 180m, OM1 59m, OM2 90, RCA 99p, Ef 55-65%; b. 12/2016 CABG x 4 (LIMA->mLAD, VG->dLAD, VG->OM1->OM2); c. 11/2018 MV: EF 66%, no ischemia.   CHF (congestive heart failure) (HCC)    Essential hypertension    High cholesterol    History of echocardiogram    a. 12/2016 Echo: EF 65-70%.   Wears dentures    partial upper   Past Surgical History:  Procedure Laterality Date   CARDIAC CATHETERIZATION  01/09/2017   "scheduling me for OHS"   CATARACT EXTRACTION W/PHACO Left 06/12/2020   Procedure: CATARACT EXTRACTION PHACO AND INTRAOCULAR LENS PLACEMENT (IOC) LEFT 4.36 00:51.9 8.4%;  Surgeon: Lockie Mola, MD;  Location: [facility];  Service: Ophthalmology;  Laterality: Left;   COLONOSCOPY WITH PROPOFOL N/A 11/09/2016   Procedure: COLONOSCOPY WITH PROPOFOL;  Surgeon:  Earline Mayotte, MD;  Location: [facility] ENDOSCOPY;  Service: Endoscopy;  Laterality: N/A;   COLONOSCOPY WITH PROPOFOL N/A 11/29/2019   Procedure: COLONOSCOPY WITH PROPOFOL;  Surgeon: Earline Mayotte, MD;  Location: [facility] ENDOSCOPY;  Service: Endoscopy;  Laterality: N/A;   CORONARY ARTERY BYPASS GRAFT N/A 01/12/2017   CABG x 4: LIMA to mLAD, SVG to dLAD, sequential SVG to OM1-OM2, EVH via left thigh and leg;  Surgeon: Purcell Nails, MD;  Location: [facility] OR;  Service: Open Heart Surgery;  Laterality: N/A;   INTRAOPERATIVE TRANSESOPHAGEAL ECHOCARDIOGRAM N/A 01/12/2017   Procedure: INTRAOPERATIVE TRANSESOPHAGEAL ECHOCARDIOGRAM;  Surgeon: Purcell Nails, MD;  Location: [facility] OR;  Service: Open Heart Surgery;  Laterality: N/A;   LEFT HEART CATH AND CORONARY ANGIOGRAPHY Left 01/09/2017   Procedure: Left Heart Cath and Coronary Angiography;  Surgeon: Iran Ouch, MD;  Location: [facility] INVASIVE CV LAB;  Service: Cardiovascular;  Laterality: Left;     MEDICATIONS:  (Not in a hospital admission)   ALLERGIES:  No Known Allergies  REVIEW OF SYSTEMS:  Pertinent items are noted in HPI.   FAMILY HISTORY:   Family History  Problem Relation Age of Onset   Heart attack Mother    Colon cancer Brother     SOCIAL HISTORY:   Social History   Tobacco Use   Smoking status: Never   Smokeless tobacco: Never  Substance Use Topics   Alcohol use: No     EXAMINATION:  Vital signs in last 24 hours: @VSRANGES @  General appearance: alert, cooperative, and  no distress Neck: no JVD and supple, symmetrical, trachea midline Lungs: clear to auscultation bilaterally Heart: regular rate and rhythm, S1, S2 normal, no murmur, click, rub or gallop Abdomen: soft, non-tender; bowel sounds normal; no masses,  no organomegaly Extremities: extremities normal, atraumatic, no cyanosis or edema and Homans sign is negative, no sign of DVT Pulses: 2+ and symmetric Skin: Skin color, texture, turgor normal. No rashes or  lesions Neurologic: Alert and oriented X 3, normal strength and tone. Normal symmetric reflexes. Normal coordination and gait  Musculoskeletal:  ROM 0-110, Ligaments intact,  Imaging Review Plain radiographs demonstrate severe degenerative joint disease of the right knee. The overall alignment is significant varus. The bone quality appears to be good for age and reported activity level.  Assessment/Plan: Primary osteoarthritis, right knee   The patient history, physical examination and imaging studies are consistent with advanced degenerative joint disease of the right knee. The patient has failed conservative treatment.  The clearance notes were reviewed.  After discussion with the patient it was felt that Total Knee Replacement was indicated. The procedure,  risks, and benefits of total knee arthroplasty were presented and reviewed. The risks including but not limited to aseptic loosening, infection, blood clots, vascular injury, stiffness, patella tracking problems complications among others were discussed. The patient acknowledged the explanation, agreed to proceed with the plan.  Dale Cook 01/08/2021, 10:09 AM

## 2021-01-08 NOTE — Patient Instructions (Addendum)
Your procedure is scheduled on: Monday, July 25 Report to the Registration Desk on the 1st floor of the [facility] To find out your arrival time, please call [phone] between 1PM - 3PM on: Friday, July 22  REMEMBER: Instructions that are not followed completely may result in serious medical risk, up to and including death; or upon the discretion of your surgeon and anesthesiologist your surgery may need to be rescheduled.  Do not eat food after midnight the night before surgery.  No gum chewing, lozengers or hard candies.  You may however, drink CLEAR liquids up to 2 hours before you are scheduled to arrive for your surgery. Do not drink anything within 2 hours of your scheduled arrival time.  Clear liquids include: - water  - apple juice without pulp - gatorade (not RED, PURPLE, OR BLUE) - black coffee or tea (Do NOT add milk or creamers to the coffee or tea) Do NOT drink anything that is not on this list.  TAKE THESE MEDICATIONS THE MORNING OF SURGERY WITH A SIP OF WATER:  Amlodipine Atorvastatin Carvedilol Isosorbide mononitrate Pantoprazole (protonix) - (take one the night before and one on the morning of surgery - helps to prevent nausea after surgery.)  Follow recommendations from Cardiologist or surgeon regarding stopping Aspirin.  Per [name], PA-C stop aspirin 5 days prior to surgery. Last day to take is Tuesday, July 19. Resume AFTER surgery per surgeon instruction.  One week prior to surgery: starting July 18 Stop MELOXICAM and Anti-inflammatories (NSAIDS) such as Advil, Aleve, Ibuprofen, Motrin, Naproxen, Naprosyn and Aspirin based products such as Excedrin, Goodys Powder, BC Powder. Stop ANY OVER THE COUNTER supplements until after surgery. You may however, continue to take Tylenol if needed for pain up until the day of surgery.  No Alcohol for 24 hours before or after surgery.  On the morning of surgery brush your teeth with toothpaste and water, you  may rinse your mouth with mouthwash if you wish. Do not swallow any toothpaste or mouthwash.  Do not wear jewelry, make-up, hairpins, clips or nail polish.  Do not wear lotions, powders, or perfumes.   Do not shave body from the neck down 48 hours prior to surgery just in case you cut yourself which could leave a site for infection.  Also, freshly shaved skin may become irritated if using the CHG soap.  Do not bring valuables to the hospital. [facility] is not responsible for any missing/lost belongings or valuables.   Use CHG Soap as directed on instruction sheet.  Notify your doctor if there is any change in your medical condition (cold, fever, infection).  Wear comfortable clothing (specific to your surgery type) to the hospital.  After surgery, you can help prevent lung complications by doing breathing exercises.  Take deep breaths and cough every 1-2 hours. Your doctor may order a device called an Incentive Spirometer to help you take deep breaths.  If you are being admitted to the hospital overnight, leave your suitcase in the car. After surgery it may be brought to your room.  If you are taking public transportation, you will need to have a responsible adult (18 years or older) with you. Please confirm with your physician that it is acceptable to use public transportation.   Please call the Pre-admissions Testing Dept. at [phone] if you have any questions about these instructions.  Surgery Visitation Policy:  Patients undergoing a surgery or procedure may have one family member or support person with  them as long as that person is not COVID-19 positive or experiencing its symptoms.  That person may remain in the waiting area during the procedure.  Inpatient Visitation:    Visiting hours are 7 a.m. to 8 p.m. Inpatients will be allowed two visitors daily. The visitors may change each day during the patient's stay. No visitors under the age of 32. Any visitor under  the age of 5 must be accompanied by an adult. The visitor must pass COVID-19 screenings, use hand sanitizer when entering and exiting the patient's room and wear a mask at all times, including in the patient's room. Patients must also wear a mask when staff or their visitor are in the room. Masking is required regardless of vaccination status.

## 2021-01-11 ENCOUNTER — Other Ambulatory Visit: Payer: Self-pay | Admitting: Cardiovascular Disease

## 2021-01-13 ENCOUNTER — Encounter: Payer: Self-pay | Admitting: Orthopedic Surgery

## 2021-01-13 NOTE — Progress Notes (Signed)
Perioperative Services  Pre-Admission/Anesthesia Testing Clinical Review  Date: 01/13/21  Patient Demographics:  Name: Dale Cook. DOB:   [birth year] MRN:   [id number]  Planned Surgical Procedure(s):    Case: [id number] Date/Time: 01/18/21 1200   Procedure: TOTAL KNEE ARTHROPLASTY (Right: Knee)   Anesthesia type: Spinal   Pre-op diagnosis: M17.11 Unilateral primary osteoarthritis, right knee   Location: [facility]   Surgeons: Lovell Sheehan, MD     NOTE: Available PAT nursing documentation and vital signs have been reviewed. Clinical nursing staff has updated patient's PMH/PSHx, current medication list, and drug allergies/intolerances to ensure comprehensive history available to assist in medical decision making as it pertains to the aforementioned surgical procedure and anticipated anesthetic course. Extensive review of available clinical information performed. [place] PMH and PSHx updated with any diagnoses/procedures that  may have been inadvertently omitted during his intake with the pre-admission testing department's nursing staff.  Clinical Discussion:  Dale Card. is a 75 y.o. male who is submitted for pre-surgical anesthesia review and clearance prior to him undergoing the above procedure. Patient has never been a smoker. Pertinent PMH includes: CAD (s/p CABG), CHF, cardiac murmur, LEFT carotid bruit, HTN, HLD, GERD (on daily PPI), OA.   Patient is followed by cardiology Fletcher Anon, MD). He was last seen in the cardiology clinic on 10/29/2020; notes reviewed.  At the time of his clinic visit, patient doing well overall from a cardiovascular perspective.  He denied any episodes of chest pain, PND, orthopnea, palpitations, vertiginous symptoms, or presyncope/syncope.  Patient with complaints of increased shortness of breath and lower extremity edema.  He is followed in the heart failure clinic and notes improvement with  prescribed interventions.  PMH significant for cardiovascular diagnoses.  Patient underwent diagnostic left heart catheterization on 01/09/2017 that revealed significant three-vessel CAD.  Left ventricular systolic function normal. High normal LVEDP.  Revascularization options felt to be limited given diffuse disease.  Decision was made to transfer patient to [facility] for consult with CVTS for consideration of CABG procedure.  TTE performed on 01/11/2017 revealed a normal left ventricular systolic function with an EF of 65-70%.  There was no significant valvular insufficiency or stenosis noted.  Patient underwent a four-vessel CABG on 01/12/2017.  LIMA-mLAD, SVG-dLAD, SVG-OM1, and SVG-OM2 bypass grafts were placed.  Myocardial perfusion imaging study performed on 12/07/2018 revealed no evidence of significant stress-induced ischemia or arrhythmia.  There were nonspecific T wave abnormalities noted on the resting ECG in leads V3 through V6.  Wall motion was normal.  LVEF estimated at 66%.  Study interpreted as low risk.  Last TTE performed on 10/15/2020 demonstrated normal left ventricular systolic function with an EF of 60-65%.  Doppler parameters consistent with grade 1 diastolic dysfunction.  There was mild left atrial dilatation.  Mild mitral and aortic valve regurgitation noted (see full interpretation of cardiovascular test below).  Patient on GDMT for his HTN and HLD diagnoses.  Blood pressure reasonably controlled at 140/70 on currently prescribed ACEi, CCB, beta-blocker, diuretic, and nitrate therapies. Patient is on a statin for his HLD. ECG in the office revealed NSR with occasional PVCs and evidence of a possible old inferior infarct. Functional capacity, as defined by DASI, is documented as being >/= 4 METS.  No changes were made to patient's medication regimen.  Patient to follow-up with outpatient cardiology in 6 months or sooner if needed.  Patient is scheduled for a total  knee  arthroplasty on 01/18/2021 with Dr. Kurtis Bushman, MD.  Given patient's past medical history significant for cardiovascular diagnoses, presurgical cardiac clearance was sought by the performing surgeon's office and PAT team. Per cardiology, "based on patient's past medical history and time since his last clinic visit, patient would be at an overall ACCEPTABLE risk for the planned procedure without further cardiovascular testing or intervention at this time". This patient is on daily antiplatelet therapy. He has been instructed to contact surgeon's office to discuss need to hold ASA preoperatively.  Patient denies previous perioperative complications with anesthesia in the past. In review of the available records, it is noted that patient underwent a MAC anesthetic course at [facility] ([facility]) in 05/2020 without documented complications.   Vitals with BMI 01/08/2021 12/23/2020 10/29/2020  Height _0  _1  _2   Weight 233 lbs 236 lbs 231 lbs  BMI 59.16 32 38.46  Systolic 659 935 701  Diastolic 81 72 70  Pulse 75 73 65    Providers/Specialists:   NOTE: Primary physician provider listed below. Patient may have been seen by APP or partner within same practice.   PROVIDER ROLE / SPECIALTY LAST Jayme Cloud, MD Orthopedics (Surgeon) 11/02/2020  Albina Billet, MD Primary Care Provider ???  Marlyne Beards, MD Cardiology 10/29/2020   Allergies:  Patient has no known allergies.  Current Home Medications:   No current facility-administered medications for this encounter.    amLODipine (NORVASC) 10 MG tablet   atorvastatin (LIPITOR) 40 MG tablet   carvedilol (COREG) 25 MG tablet   dextromethorphan-guaiFENesin (MUCINEX DM) 30-600 MG 12hr tablet   furosemide (LASIX) 20 MG tablet   GNP ASPIRIN 81 MG EC tablet   isosorbide mononitrate (IMDUR) 60 MG 24 hr tablet   loratadine (CLARITIN) 10 MG tablet   meloxicam (MOBIC) 15 MG tablet   moexipril (UNIVASC) 15 MG tablet    pantoprazole (PROTONIX) 40 MG tablet   History:   Past Medical History:  Diagnosis Date   Arthritis    "hands, knees" (01/09/2017)   CAD (coronary artery disease)    a. 12/2016 Cath: LM nl, LAD 95p, 35m 60/90d, LCX 80p/m, 1046mOM1 6024mM2 90, RCA 99p, Ef 55-65%; b. 12/2016 CABG x 4 (LIMA->mLAD, VG->dLAD, VG->OM1->OM2); c. 11/2018 MV: EF 66%, no ischemia.   Cardiac murmur    Grade II/VI early peaking systolic in aortic area   CHF (congestive heart failure) (HCC)    Essential hypertension    GERD (gastroesophageal reflux disease)    High cholesterol    History of echocardiogram    a. 12/2016 Echo: EF 65-70%.   Wears dentures    partial upper   Past Surgical History:  Procedure Laterality Date   CATARACT EXTRACTION W/PHACO Left 06/12/2020   Procedure: CATARACT EXTRACTION PHACO AND INTRAOCULAR LENS PLACEMENT (IOC) LEFT 4.36 00:51.9 8.4%;  Surgeon: BraLeandrew KoyanagiD;  [facility]  Laterality: Left;   COLONOSCOPY WITH PROPOFOL N/A 11/09/2016   Procedure: COLONOSCOPY WITH PROPOFOL;  Surgeon: ByrRobert BellowD;  Location: [facility] ENDOSCOPY;  Service: Endoscopy;  Laterality: N/A;   COLONOSCOPY WITH PROPOFOL N/A 11/29/2019   Procedure: COLONOSCOPY WITH PROPOFOL;  Surgeon: ByrRobert BellowD;  Location: [facility] ENDOSCOPY;  Service: Endoscopy;  Laterality: N/A;   CORONARY ARTERY BYPASS GRAFT N/A 01/12/2017   CABG x 4: LIMA to mLAD, SVG to dLAD, sequential SVG to OM1-OM2, EVH via left thigh and leg;  Surgeon: OweRexene AlbertsD;  Location: [facility];  Service: Open Heart Surgery;  Laterality: N/A;   INTRAOPERATIVE TRANSESOPHAGEAL ECHOCARDIOGRAM N/A 01/12/2017   Procedure: INTRAOPERATIVE TRANSESOPHAGEAL ECHOCARDIOGRAM;  Surgeon: Rexene Alberts, MD;  Location: [place];  Service: Open Heart Surgery;  Laterality: N/A;   LEFT HEART CATH AND CORONARY ANGIOGRAPHY Left 01/09/2017   Procedure: Left Heart Cath and Coronary Angiography;  Surgeon: Wellington Hampshire, MD;  Location: [place] CV LAB;  Service: Cardiovascular;  Laterality: Left;   Family History  Problem Relation Age of Onset   Heart attack Mother    Colon cancer Brother    Social History   Tobacco Use   Smoking status: Never   Smokeless tobacco: Never  Vaping Use   Vaping Use: Never used  Substance Use Topics   Alcohol use: No   Drug use: No    Pertinent Clinical Results:  LABS: Labs reviewed: Acceptable for surgery.  No visits with results within 3 Day(s) from this visit.  Latest known visit with results is:  Hospital Outpatient Visit on 01/08/2021  Component Date Value Ref Range Status   ABO/RH(D) 01/08/2021 A POS   Final   Antibody Screen 01/08/2021 NEG   Final   Sample Expiration 01/08/2021 01/22/2021,2359   Final   Extend sample reason 01/08/2021    Final                   Value:NO TRANSFUSIONS OR PREGNANCY IN THE PAST 3 MONTHS Performed at [facility], [address] [place], [place] [address]    MRSA, PCR 01/08/2021 NEGATIVE  NEGATIVE Final   Staphylococcus aureus 01/08/2021 NEGATIVE  NEGATIVE Final   Comment: (NOTE) The Xpert SA Assay (FDA approved for NASAL specimens in patients 21 years of age and older), is one component of a comprehensive surveillance program. It is not intended to diagnose infection nor to guide or monitor treatment. Performed at [facility], [address] [place],  [address]    WBC 01/08/2021 8.2  4.0 - 10.5 K/uL Final   RBC 01/08/2021 5.08  4.22 - 5.81 MIL/uL Final   Hemoglobin 01/08/2021 14.3  13.0 - 17.0 g/dL Final   HCT 01/08/2021 42.7  39.0 - 52.0 % Final   MCV 01/08/2021 84.1  80.0 - 100.0 fL Final   MCH 01/08/2021 28.1  26.0 - 34.0 pg Final   MCHC 01/08/2021 33.5  30.0 - 36.0 g/dL Final   RDW 01/08/2021 14.7  11.5 - 15.5 % Final   Platelets 01/08/2021 193  150 - 400 K/uL Final   nRBC 01/08/2021 0.0  0.0 - 0.2 % Final   Performed at [facility], [place], [place] [address]   Sodium 01/08/2021 137  135 - 145 mmol/L Final   Potassium 01/08/2021 3.9  3.5 - 5.1 mmol/L Final   Chloride 01/08/2021 105  98 - 111 mmol/L Final   CO2 01/08/2021 29  22 - 32 mmol/L Final   Glucose, Bld 01/08/2021 111 (A) 70 - 99 mg/dL Final   Glucose reference range applies only to samples taken after fasting for at least 8 hours.   BUN 01/08/2021 19  8 - 23 mg/dL Final   Creatinine, Ser 01/08/2021 1.59 (A) 0.61 - 1.24 mg/dL Final   Calcium 01/08/2021 8.7 (A) 8.9 - 10.3 mg/dL Final   GFR, Estimated 01/08/2021 46 (A) >60 mL/min Final   Comment: (NOTE) Calculated using the CKD-EPI Creatinine Equation (2021)    Anion gap 01/08/2021 3 (A) 5 -  15 Final   Performed at [facility], [address] [place], [place] [address]   Prothrombin Time 01/08/2021 13.9  11.4 - 15.2 seconds Final   INR 01/08/2021 1.1  0.8 - 1.2 Final   Comment: (NOTE) INR goal varies based on device and disease states. Performed at [facility], [address] [place], [place] [address]    aPTT 01/08/2021 31  24 - 36 seconds Final   Performed at [facility], [place], [place] [address]   Color, Urine 01/08/2021 YELLOW (A) YELLOW Final   APPearance 01/08/2021 CLEAR (A) CLEAR Final   Specific Gravity, Urine 01/08/2021 1.014  1.005 - 1.030 Final   pH 01/08/2021 5.0  5.0 - 8.0 Final   Glucose, UA 01/08/2021 NEGATIVE  NEGATIVE mg/dL Final   Hgb urine dipstick 01/08/2021 NEGATIVE  NEGATIVE Final   Bilirubin Urine 01/08/2021 NEGATIVE  NEGATIVE Final   Ketones, ur 01/08/2021 NEGATIVE  NEGATIVE mg/dL Final   Protein, ur 01/08/2021 NEGATIVE  NEGATIVE mg/dL Final   Nitrite 01/08/2021 NEGATIVE  NEGATIVE Final   Leukocytes,Ua 01/08/2021 NEGATIVE  NEGATIVE Final   Performed at [facility], [address] [place], [place] [address]    ECG: Date: 10/29/2020 Time ECG obtained: 1612 PM Rate: 65 bpm Rhythm:  Sinus rhythm with occasional  PVCs Intervals: PR 186 ms. QRS 86 ms. QTc 420 ms. ST segment and T wave changes: No evidence of acute ST segment elevation or depression; evidence of an age undetermined inferior infarct Comparison: Similar to previous tracing obtained on 09/19/2020   IMAGING / PROCEDURES: TRANSTHORACIC ECHOCARDIOGRAM performed on 10/15/2020 Left ventricular ejection fraction, by estimation, is 60 to 65%. The left ventricle has normal function. The left ventricle has no regional wall motion abnormalities. Left ventricular diastolic parameters are consistent with Grade I diastolic dysfunction (impaired relaxation).  Right ventricular systolic function is normal. The right ventricular size is normal. There is normal pulmonary artery systolic pressure. The estimated right ventricular systolic pressure is 94.7 mmHg.  Left atrial size was mildly dilated. The mitral valve is normal in structure. Mild mitral valve regurgitation.  The aortic valve is normal in structure. Aortic valve regurgitation is mild.   LEXISCAN performed on 12/07/2018 LVEF estimated at 66% Nonspecific T wave abnormality on resting EKG in leads V3 to V6 Normal wall motion no evidence of significant stress-induced myocardial ischemia or arrhythmia  Low risk scan  TRANSESOPHAGEAL ECHOCARDIOGRAM performed on 01/12/2017 LVEF 50-55% Left ventricular systolic function normal LVH noted Trivial aortic valve regurgitation Mildly calcified mitral valve annulus with thickened leaflets No evidence of thrombus in the LAA No ASD or PFO identified No evidence of tricuspid valve vegetation  CORONARY ARTERY BYPASS GRAFTING performed on 01/12/2017 Four-vessel CABG procedure LIMA-mLAD SVG-dLAD SVG-OM1 SVG-OM 2  LEFT HEART CATHETERIZATION AND CORONARY ANGIOGRAPHY performed on 01/09/2017 Significant three-vessel  80% stenosis of the proximal to mid LCx 60% stenosis of the ostial first marginal to first marginal 100% stenosis of the mid LCx 95%  stenosis of the ostial to proximal LAD 60% stenosis of the mid LAD 60% stenosis of the distal LAD-2 90% stenosis of the distal LAD-1 90% stenosis of the ostial second marginal to second marginal 99% stenosis of the proximal to mid RCA Normal left ventricular systolic function High normal LVEDP    Impression and Plan:  Dale Cook. has been referred for pre-anesthesia review and clearance prior to him undergoing the planned anesthetic and procedural courses. Available labs, pertinent testing, and  imaging results were personally reviewed by me. This patient has been appropriately cleared by cardiology with an overall ACCEPTABLE risk of significant perioperative cardiovascular complications.  Based on clinical review performed today (01/13/21), barring any significant acute changes in the patient's overall condition, it is anticipated that he will be able to proceed with the planned surgical intervention. Any acute changes in clinical condition may necessitate his procedure being postponed and/or cancelled. Patient will meet with anesthesia team (MD and/or CRNA) on the day of his procedure for preoperative evaluation/assessment. Questions regarding anesthetic course will be fielded at that time.   Pre-surgical instructions were reviewed with the patient during his PAT appointment and questions were fielded by PAT clinical staff. Patient was advised that if any questions or concerns arise prior to his procedure then he should return a call to PAT and/or his surgeon's office to discuss.  Honor Loh, MSN, APRN, FNP-C, CEN [facility]  Peri-operative Services Nurse Practitioner Phone: [phone] Fax: [phone] 01/13/21 10:11 AM  NOTE: This note has been prepared using Dragon dictation software. Despite my best ability to proofread, there is always the potential that unintentional transcriptional errors may still occur from this process.

## 2021-01-14 ENCOUNTER — Other Ambulatory Visit: Payer: Medicare HMO

## 2021-01-14 ENCOUNTER — Other Ambulatory Visit: Payer: Self-pay

## 2021-01-14 ENCOUNTER — Other Ambulatory Visit
Admission: RE | Admit: 2021-01-14 | Discharge: 2021-01-14 | Disposition: A | Discharge status: Home / Self Care | Payer: Medicare HMO | Source: Ambulatory Visit | Attending: Orthopedic Surgery | Admitting: Orthopedic Surgery

## 2021-01-14 DIAGNOSIS — Z20822 Contact with and (suspected) exposure to covid-19: Secondary | ICD-10-CM | POA: Diagnosis not present

## 2021-01-14 DIAGNOSIS — Z01812 Encounter for preprocedural laboratory examination: Secondary | ICD-10-CM | POA: Diagnosis present

## 2021-01-14 LAB — SARS CORONAVIRUS 2 (TAT 6-24 HRS): SARS Coronavirus 2: NEGATIVE

## 2021-01-17 MED ORDER — ORAL CARE MOUTH RINSE: 15.0000 mL | Freq: Once | OROMUCOSAL | Status: AC

## 2021-01-17 MED ORDER — CEFAZOLIN SODIUM-DEXTROSE 2-4 GM/100ML-% IV SOLN
2.0000 g | INTRAVENOUS | Status: AC
  Administered 2021-01-18: 2 g via INTRAVENOUS

## 2021-01-17 MED ORDER — CHLORHEXIDINE GLUCONATE 0.12 % MT SOLN: 15.0000 mL | Freq: Once | OROMUCOSAL | Status: AC

## 2021-01-17 MED ORDER — LACTATED RINGERS IV SOLN: INTRAVENOUS | Status: DC

## 2021-01-17 MED ORDER — TRANEXAMIC ACID-NACL 1000-0.7 MG/100ML-% IV SOLN
1000.0000 mg | INTRAVENOUS | Status: AC
  Administered 2021-01-18: 1000 mg via INTRAVENOUS

## 2021-01-18 ENCOUNTER — Ambulatory Visit: Payer: Medicare HMO | Admitting: Urgent Care

## 2021-01-18 ENCOUNTER — Encounter: Payer: Self-pay | Admitting: Orthopedic Surgery

## 2021-01-18 ENCOUNTER — Ambulatory Visit
Admission: RE | Admit: 2021-01-18 | Discharge: 2021-01-20 | Disposition: A | Discharge status: Home / Self Care | Payer: Medicare HMO | Attending: Orthopedic Surgery | Admitting: Orthopedic Surgery

## 2021-01-18 ENCOUNTER — Ambulatory Visit: Payer: Medicare HMO

## 2021-01-18 ENCOUNTER — Encounter
Admission: RE | Disposition: A | Discharge status: Home / Self Care | Payer: Self-pay | Source: Home / Self Care | Attending: Orthopedic Surgery

## 2021-01-18 ENCOUNTER — Other Ambulatory Visit: Payer: Self-pay

## 2021-01-18 DIAGNOSIS — Z951 Presence of aortocoronary bypass graft: Secondary | ICD-10-CM | POA: Diagnosis not present

## 2021-01-18 DIAGNOSIS — M1711 Unilateral primary osteoarthritis, right knee: Secondary | ICD-10-CM | POA: Diagnosis present

## 2021-01-18 DIAGNOSIS — Z7982 Long term (current) use of aspirin: Secondary | ICD-10-CM | POA: Insufficient documentation

## 2021-01-18 DIAGNOSIS — E785 Hyperlipidemia, unspecified: Secondary | ICD-10-CM | POA: Diagnosis not present

## 2021-01-18 DIAGNOSIS — I509 Heart failure, unspecified: Secondary | ICD-10-CM | POA: Diagnosis not present

## 2021-01-18 DIAGNOSIS — I2510 Atherosclerotic heart disease of native coronary artery without angina pectoris: Secondary | ICD-10-CM | POA: Insufficient documentation

## 2021-01-18 DIAGNOSIS — Z791 Long term (current) use of non-steroidal anti-inflammatories (NSAID): Secondary | ICD-10-CM | POA: Diagnosis not present

## 2021-01-18 DIAGNOSIS — Z79899 Other long term (current) drug therapy: Secondary | ICD-10-CM | POA: Diagnosis not present

## 2021-01-18 DIAGNOSIS — Z419 Encounter for procedure for purposes other than remedying health state, unspecified: Secondary | ICD-10-CM

## 2021-01-18 DIAGNOSIS — K219 Gastro-esophageal reflux disease without esophagitis: Secondary | ICD-10-CM | POA: Insufficient documentation

## 2021-01-18 DIAGNOSIS — I110 Hypertensive heart disease with heart failure: Secondary | ICD-10-CM | POA: Insufficient documentation

## 2021-01-18 DIAGNOSIS — Z96651 Presence of right artificial knee joint: Secondary | ICD-10-CM

## 2021-01-18 HISTORY — DX: Gastro-esophageal reflux disease without esophagitis: K21.9

## 2021-01-18 HISTORY — PX: TOTAL KNEE ARTHROPLASTY: SHX125

## 2021-01-18 HISTORY — DX: Other specified symptoms and signs involving the circulatory and respiratory systems: R09.89

## 2021-01-18 HISTORY — DX: Cardiac murmur, unspecified: R01.1

## 2021-01-18 SURGERY — ARTHROPLASTY, KNEE, TOTAL
Anesthesia: Spinal | Site: Knee | Laterality: Right

## 2021-01-18 MED ORDER — LIDOCAINE HCL (PF) 2 % IJ SOLN
INTRAMUSCULAR | Status: AC
  Filled 2021-01-18: qty 5

## 2021-01-18 MED ORDER — DOCUSATE SODIUM 100 MG PO CAPS
100.0000 mg | ORAL_CAPSULE | Freq: Two times a day (BID) | ORAL | Status: DC
  Administered 2021-01-18 – 2021-01-20 (×4): 100 mg via ORAL
  Filled 2021-01-18 (×4): qty 1

## 2021-01-18 MED ORDER — FENTANYL CITRATE (PF) 100 MCG/2ML IJ SOLN
INTRAMUSCULAR | Status: DC | PRN
  Administered 2021-01-18: 100 ug via INTRAVENOUS

## 2021-01-18 MED ORDER — ISOSORBIDE MONONITRATE ER 30 MG PO TB24
60.0000 mg | ORAL_TABLET | Freq: Every day | ORAL | Status: DC
  Administered 2021-01-19 – 2021-01-20 (×2): 60 mg via ORAL
  Filled 2021-01-18 (×2): qty 2

## 2021-01-18 MED ORDER — ONDANSETRON HCL 4 MG/2ML IJ SOLN
INTRAMUSCULAR | Status: AC
  Filled 2021-01-18: qty 2

## 2021-01-18 MED ORDER — MORPHINE SULFATE (PF) 2 MG/ML IV SOLN
2.0000 mg | INTRAVENOUS | Status: DC | PRN
  Administered 2021-01-19: 2 mg via INTRAVENOUS
  Filled 2021-01-18: qty 1

## 2021-01-18 MED ORDER — ACETAMINOPHEN 10 MG/ML IV SOLN
INTRAVENOUS | Status: AC
  Filled 2021-01-18: qty 100

## 2021-01-18 MED ORDER — ONDANSETRON HCL 4 MG/2ML IJ SOLN: 4.0000 mg | Freq: Four times a day (QID) | INTRAMUSCULAR | Status: DC | PRN

## 2021-01-18 MED ORDER — BUPIVACAINE-EPINEPHRINE (PF) 0.5% -1:200000 IJ SOLN
INTRAMUSCULAR | Status: DC | PRN
  Administered 2021-01-18: 30 mL via PERINEURAL

## 2021-01-18 MED ORDER — ROPIVACAINE HCL 5 MG/ML IJ SOLN
INTRAMUSCULAR | Status: AC
  Filled 2021-01-18: qty 30

## 2021-01-18 MED ORDER — SODIUM CHLORIDE 0.9 % IV SOLN
INTRAVENOUS | Status: DC | PRN
  Administered 2021-01-18: 30 ug/min via INTRAVENOUS

## 2021-01-18 MED ORDER — SUGAMMADEX SODIUM 500 MG/5ML IV SOLN
INTRAVENOUS | Status: DC | PRN
  Administered 2021-01-18: 250 mg via INTRAVENOUS

## 2021-01-18 MED ORDER — FENTANYL CITRATE (PF) 100 MCG/2ML IJ SOLN
INTRAMUSCULAR | Status: AC
  Filled 2021-01-18: qty 2

## 2021-01-18 MED ORDER — DEXAMETHASONE SODIUM PHOSPHATE 10 MG/ML IJ SOLN
INTRAMUSCULAR | Status: AC
  Filled 2021-01-18: qty 1

## 2021-01-18 MED ORDER — LIDOCAINE HCL (PF) 1 % IJ SOLN
INTRAMUSCULAR | Status: AC
  Filled 2021-01-18: qty 5

## 2021-01-18 MED ORDER — ONDANSETRON HCL 4 MG/2ML IJ SOLN
INTRAMUSCULAR | Status: DC | PRN
  Administered 2021-01-18: 4 mg via INTRAVENOUS

## 2021-01-18 MED ORDER — PHENYLEPHRINE HCL (PRESSORS) 10 MG/ML IV SOLN
INTRAVENOUS | Status: DC | PRN
  Administered 2021-01-18: 100 ug via INTRAVENOUS

## 2021-01-18 MED ORDER — ROCURONIUM BROMIDE 100 MG/10ML IV SOLN
INTRAVENOUS | Status: DC | PRN
  Administered 2021-01-18: 30 mg via INTRAVENOUS
  Administered 2021-01-18 (×3): 20 mg via INTRAVENOUS

## 2021-01-18 MED ORDER — PANTOPRAZOLE SODIUM 40 MG PO TBEC
40.0000 mg | DELAYED_RELEASE_TABLET | Freq: Every day | ORAL | Status: DC
  Administered 2021-01-19 – 2021-01-20 (×2): 40 mg via ORAL
  Filled 2021-01-18 (×2): qty 1

## 2021-01-18 MED ORDER — DEXMEDETOMIDINE (PRECEDEX) IN NS 20 MCG/5ML (4 MCG/ML) IV SYRINGE
PREFILLED_SYRINGE | INTRAVENOUS | Status: DC | PRN
  Administered 2021-01-18 (×5): 4 ug via INTRAVENOUS

## 2021-01-18 MED ORDER — FENTANYL CITRATE (PF) 100 MCG/2ML IJ SOLN
INTRAMUSCULAR | Status: AC
  Administered 2021-01-18: 50 ug via INTRAVENOUS
  Filled 2021-01-18: qty 2

## 2021-01-18 MED ORDER — ATORVASTATIN CALCIUM 20 MG PO TABS
40.0000 mg | ORAL_TABLET | Freq: Every day | ORAL | Status: DC
  Administered 2021-01-19 – 2021-01-20 (×2): 40 mg via ORAL
  Filled 2021-01-18 (×2): qty 2

## 2021-01-18 MED ORDER — MIDAZOLAM HCL 2 MG/2ML IJ SOLN
INTRAMUSCULAR | Status: AC
  Filled 2021-01-18: qty 2

## 2021-01-18 MED ORDER — CARVEDILOL 25 MG PO TABS
25.0000 mg | ORAL_TABLET | Freq: Two times a day (BID) | ORAL | Status: DC
  Administered 2021-01-18 – 2021-01-20 (×4): 25 mg via ORAL
  Filled 2021-01-18 (×4): qty 1

## 2021-01-18 MED ORDER — FENTANYL CITRATE (PF) 100 MCG/2ML IJ SOLN
25.0000 ug | INTRAMUSCULAR | Status: DC | PRN
  Administered 2021-01-18: 25 ug via INTRAVENOUS

## 2021-01-18 MED ORDER — LIDOCAINE HCL (PF) 1 % IJ SOLN
INTRAMUSCULAR | Status: DC | PRN
  Administered 2021-01-18: 5 mL via SUBCUTANEOUS

## 2021-01-18 MED ORDER — SODIUM CHLORIDE 0.9 % IR SOLN
Status: DC | PRN
  Administered 2021-01-18: 3000 mL

## 2021-01-18 MED ORDER — MIDAZOLAM HCL 2 MG/2ML IJ SOLN
INTRAMUSCULAR | Status: DC | PRN
  Administered 2021-01-18: 2 mg via INTRAVENOUS

## 2021-01-18 MED ORDER — FENTANYL CITRATE (PF) 100 MCG/2ML IJ SOLN: 50.0000 ug | Freq: Once | INTRAMUSCULAR | Status: AC

## 2021-01-18 MED ORDER — METOCLOPRAMIDE HCL 10 MG PO TABS: 5.0000 mg | ORAL_TABLET | Freq: Three times a day (TID) | ORAL | Status: DC | PRN

## 2021-01-18 MED ORDER — FUROSEMIDE 20 MG PO TABS
20.0000 mg | ORAL_TABLET | Freq: Every day | ORAL | Status: DC
  Administered 2021-01-19 – 2021-01-20 (×2): 20 mg via ORAL
  Filled 2021-01-18 (×2): qty 1

## 2021-01-18 MED ORDER — KETAMINE HCL 50 MG/5ML IJ SOSY
PREFILLED_SYRINGE | INTRAMUSCULAR | Status: AC
  Filled 2021-01-18: qty 5

## 2021-01-18 MED ORDER — ASPIRIN 81 MG PO CHEW
81.0000 mg | CHEWABLE_TABLET | Freq: Two times a day (BID) | ORAL | Status: DC
  Administered 2021-01-18 – 2021-01-20 (×4): 81 mg via ORAL
  Filled 2021-01-18 (×4): qty 1

## 2021-01-18 MED ORDER — PROPOFOL 10 MG/ML IV BOLUS
INTRAVENOUS | Status: DC | PRN
  Administered 2021-01-18: 130 mg via INTRAVENOUS

## 2021-01-18 MED ORDER — CEFAZOLIN SODIUM-DEXTROSE 2-4 GM/100ML-% IV SOLN
INTRAVENOUS | Status: AC
  Administered 2021-01-18: 2 g via INTRAVENOUS
  Filled 2021-01-18: qty 100

## 2021-01-18 MED ORDER — SUCCINYLCHOLINE CHLORIDE 20 MG/ML IJ SOLN
INTRAMUSCULAR | Status: DC | PRN
  Administered 2021-01-18: 100 mg via INTRAVENOUS

## 2021-01-18 MED ORDER — TRANEXAMIC ACID-NACL 1000-0.7 MG/100ML-% IV SOLN
INTRAVENOUS | Status: AC
  Filled 2021-01-18: qty 100

## 2021-01-18 MED ORDER — PHENOL 1.4 % MT LIQD
1.0000 | OROMUCOSAL | Status: DC | PRN
  Filled 2021-01-18: qty 177

## 2021-01-18 MED ORDER — DIPHENHYDRAMINE HCL 12.5 MG/5ML PO ELIX: 12.5000 mg | ORAL_SOLUTION | ORAL | Status: DC | PRN

## 2021-01-18 MED ORDER — SURGIRINSE WOUND IRRIGATION SYSTEM - OPTIME: TOPICAL | Status: DC | PRN

## 2021-01-18 MED ORDER — POVIDONE-IODINE 10 % EX SWAB
2.0000 "application " | Freq: Once | CUTANEOUS | Status: AC
  Administered 2021-01-18: 2 via TOPICAL

## 2021-01-18 MED ORDER — OXYCODONE-ACETAMINOPHEN 5-325 MG PO TABS
1.0000 | ORAL_TABLET | ORAL | Status: DC | PRN
  Administered 2021-01-18: 2 via ORAL
  Administered 2021-01-18: 1 via ORAL
  Administered 2021-01-19 – 2021-01-20 (×4): 2 via ORAL
  Filled 2021-01-18: qty 1
  Filled 2021-01-18 (×5): qty 2

## 2021-01-18 MED ORDER — METOCLOPRAMIDE HCL 5 MG/ML IJ SOLN: 5.0000 mg | Freq: Three times a day (TID) | INTRAMUSCULAR | Status: DC | PRN

## 2021-01-18 MED ORDER — SODIUM CHLORIDE 0.9 % IV SOLN
INTRAVENOUS | Status: DC | PRN
  Administered 2021-01-18: 60 mL

## 2021-01-18 MED ORDER — CEFAZOLIN SODIUM-DEXTROSE 2-4 GM/100ML-% IV SOLN
2.0000 g | Freq: Four times a day (QID) | INTRAVENOUS | Status: AC
  Administered 2021-01-19: 2 g via INTRAVENOUS
  Filled 2021-01-18 (×2): qty 100

## 2021-01-18 MED ORDER — ACETAMINOPHEN 10 MG/ML IV SOLN
INTRAVENOUS | Status: DC | PRN
  Administered 2021-01-18: 1000 mg via INTRAVENOUS

## 2021-01-18 MED ORDER — NEOMYCIN-POLYMYXIN B GU 40-200000 IR SOLN
Status: DC | PRN
  Administered 2021-01-18: 14 mL

## 2021-01-18 MED ORDER — 0.9 % SODIUM CHLORIDE (POUR BTL) OPTIME
TOPICAL | Status: DC | PRN
  Administered 2021-01-18: 1000 mL

## 2021-01-18 MED ORDER — ROPIVACAINE HCL 5 MG/ML IJ SOLN
INTRAMUSCULAR | Status: DC | PRN
  Administered 2021-01-18: 30 mL via PERINEURAL

## 2021-01-18 MED ORDER — KETAMINE HCL 10 MG/ML IJ SOLN
INTRAMUSCULAR | Status: DC | PRN
  Administered 2021-01-18: 10 mg via INTRAVENOUS
  Administered 2021-01-18: 20 mg via INTRAVENOUS

## 2021-01-18 MED ORDER — AMLODIPINE BESYLATE 10 MG PO TABS
10.0000 mg | ORAL_TABLET | Freq: Every day | ORAL | Status: DC
  Administered 2021-01-19 – 2021-01-20 (×2): 10 mg via ORAL
  Filled 2021-01-18 (×2): qty 1

## 2021-01-18 MED ORDER — TRANDOLAPRIL 1 MG PO TABS
2.0000 mg | ORAL_TABLET | Freq: Every day | ORAL | Status: DC
Start: 2021-01-18 — End: 2021-01-20
  Administered 2021-01-19 – 2021-01-20 (×2): 2 mg via ORAL
  Filled 2021-01-18 (×3): qty 2

## 2021-01-18 MED ORDER — ALUM & MAG HYDROXIDE-SIMETH 200-200-20 MG/5ML PO SUSP: 30.0000 mL | ORAL | Status: DC | PRN

## 2021-01-18 MED ORDER — MIDAZOLAM HCL 2 MG/2ML IJ SOLN
INTRAMUSCULAR | Status: AC
  Administered 2021-01-18: 1 mg via INTRAVENOUS
  Filled 2021-01-18: qty 2

## 2021-01-18 MED ORDER — MIDAZOLAM HCL 2 MG/2ML IJ SOLN: 1.0000 mg | Freq: Once | INTRAMUSCULAR | Status: AC

## 2021-01-18 MED ORDER — ONDANSETRON HCL 4 MG PO TABS
4.0000 mg | ORAL_TABLET | Freq: Four times a day (QID) | ORAL | Status: DC | PRN
  Administered 2021-01-18 – 2021-01-20 (×2): 4 mg via ORAL
  Filled 2021-01-18 (×2): qty 1

## 2021-01-18 MED ORDER — MENTHOL 3 MG MT LOZG
1.0000 | LOZENGE | OROMUCOSAL | Status: DC | PRN
  Filled 2021-01-18: qty 9

## 2021-01-18 MED ORDER — BISACODYL 10 MG RE SUPP: 10.0000 mg | Freq: Every day | RECTAL | Status: DC | PRN

## 2021-01-18 MED ORDER — SUGAMMADEX SODIUM 500 MG/5ML IV SOLN
INTRAVENOUS | Status: AC
  Filled 2021-01-18: qty 5

## 2021-01-18 MED ORDER — CHLORHEXIDINE GLUCONATE 0.12 % MT SOLN
OROMUCOSAL | Status: AC
  Administered 2021-01-18: 15 mL via OROMUCOSAL
  Filled 2021-01-18: qty 15

## 2021-01-18 MED ORDER — LORATADINE 10 MG PO TABS: 10.0000 mg | ORAL_TABLET | Freq: Every day | ORAL | Status: DC | PRN

## 2021-01-18 MED ORDER — LACTATED RINGERS IV SOLN: INTRAVENOUS | Status: DC

## 2021-01-18 MED ORDER — DEXAMETHASONE SODIUM PHOSPHATE 10 MG/ML IJ SOLN
INTRAMUSCULAR | Status: DC | PRN
  Administered 2021-01-18: 10 mg via INTRAVENOUS

## 2021-01-18 SURGICAL SUPPLY — 67 items
BASEPLATE TIBIAL SZ 6 (Knees) ×1 IMPLANT
BLADE SAGITTAL AGGR TOOTH XLG (BLADE) ×2 IMPLANT
BLADE SAW SAG 25X90X1.19 (BLADE) ×2 IMPLANT
BOWL CEMENT MIX W/ADAPTER (MISCELLANEOUS) ×2 IMPLANT
BRUSH SCRUB EZ  4% CHG (MISCELLANEOUS) ×2
BRUSH SCRUB EZ 4% CHG (MISCELLANEOUS) ×2 IMPLANT
CANISTER SUCT 1200ML W/VALVE (MISCELLANEOUS) ×2 IMPLANT
CEMENT BONE 40GM (Cement) ×4 IMPLANT
CHLORAPREP W/TINT 26 (MISCELLANEOUS) ×4 IMPLANT
COMP PATELLA GENESIS 35MM (Stem) ×2 IMPLANT
COMPONENT PATELLA GENESIS 35MM (Stem) ×1 IMPLANT
COOLER POLAR GLACIER W/PUMP (MISCELLANEOUS) ×2 IMPLANT
CUFF TOURN SGL QUICK 34 (TOURNIQUET CUFF) ×1
CUFF TRNQT CYL 34X4.125X (TOURNIQUET CUFF) ×1 IMPLANT
DRAPE 3/4 80X56 (DRAPES) ×4 IMPLANT
DRAPE INCISE IOBAN 66X60 STRL (DRAPES) ×2 IMPLANT
ELECT REM PT RETURN 9FT ADLT (ELECTROSURGICAL) ×2
ELECTRODE REM PT RTRN 9FT ADLT (ELECTROSURGICAL) ×1 IMPLANT
FEM COMP OXINUM SZ7 RT (Knees) ×2 IMPLANT
FEMORAL COMP OXINUM SZ7 RT (Knees) ×1 IMPLANT
GAUZE 4X4 16PLY ~~LOC~~+RFID DBL (SPONGE) ×2 IMPLANT
GAUZE SPONGE 4X4 12PLY STRL (GAUZE/BANDAGES/DRESSINGS) ×2 IMPLANT
GAUZE XEROFORM 1X8 LF (GAUZE/BANDAGES/DRESSINGS) ×2 IMPLANT
GLOVE SURG ENC MOIS LTX SZ8 (GLOVE) ×2 IMPLANT
GLOVE SURG ORTHO LTX SZ8 (GLOVE) ×6 IMPLANT
GLOVE SURG UNDER LTX SZ8 (GLOVE) ×2 IMPLANT
GLOVE SURG UNDER POLY LF SZ8.5 (GLOVE) ×2 IMPLANT
GOWN STRL REUS W/ TWL LRG LVL3 (GOWN DISPOSABLE) ×1 IMPLANT
GOWN STRL REUS W/ TWL XL LVL3 (GOWN DISPOSABLE) ×1 IMPLANT
GOWN STRL REUS W/TWL LRG LVL3 (GOWN DISPOSABLE) ×1
GOWN STRL REUS W/TWL XL LVL3 (GOWN DISPOSABLE) ×1
HOOD PEEL AWAY FLYTE STAYCOOL (MISCELLANEOUS) ×6 IMPLANT
INSERT ART HI FLEX 9 SZ5-6 (Insert) ×2 IMPLANT
IRRIGATION SURGIPHOR STRL (IV SOLUTION) ×2 IMPLANT
IV NS 1000ML (IV SOLUTION) ×1
IV NS 1000ML BAXH (IV SOLUTION) ×1 IMPLANT
KIT TURNOVER KIT A (KITS) ×2 IMPLANT
MANIFOLD NEPTUNE II (INSTRUMENTS) ×2 IMPLANT
MAT ABSORB  FLUID 56X50 GRAY (MISCELLANEOUS) ×1
MAT ABSORB FLUID 56X50 GRAY (MISCELLANEOUS) ×1 IMPLANT
NDL SAFETY ECLIPSE 18X1.5 (NEEDLE) ×1 IMPLANT
NEEDLE HYPO 18GX1.5 SHARP (NEEDLE) ×1
NEEDLE SPNL 20GX3.5 QUINCKE YW (NEEDLE) ×2 IMPLANT
NS IRRIG 1000ML POUR BTL (IV SOLUTION) ×2 IMPLANT
PACK TOTAL KNEE (MISCELLANEOUS) ×2 IMPLANT
PAD ABD DERMACEA PRESS 5X9 (GAUZE/BANDAGES/DRESSINGS) ×2 IMPLANT
PAD CAST 4YDX4 CTTN HI CHSV (CAST SUPPLIES) ×1 IMPLANT
PAD DE MAYO PRESSURE PROTECT (MISCELLANEOUS) ×2 IMPLANT
PAD WRAPON POLAR KNEE (MISCELLANEOUS) ×1 IMPLANT
PADDING CAST COTTON 4X4 STRL (CAST SUPPLIES) ×1
PULSAVAC PLUS IRRIG FAN TIP (DISPOSABLE) ×2
SPONGE T-LAP 18X18 ~~LOC~~+RFID (SPONGE) ×6 IMPLANT
STAPLER SKIN PROX 35W (STAPLE) ×2 IMPLANT
STOCKINETTE BIAS CUT 6 980064 (GAUZE/BANDAGES/DRESSINGS) ×2 IMPLANT
SUCTION FRAZIER HANDLE 10FR (MISCELLANEOUS) ×1
SUCTION TUBE FRAZIER 10FR DISP (MISCELLANEOUS) ×1 IMPLANT
SUT DVC 2 QUILL PDO  T11 36X36 (SUTURE) ×1
SUT DVC 2 QUILL PDO T11 36X36 (SUTURE) ×1 IMPLANT
SUT VIC AB 2-0 CT1 18 (SUTURE) ×2 IMPLANT
SUT VIC AB 2-0 CT1 27 (SUTURE)
SUT VIC AB 2-0 CT1 TAPERPNT 27 (SUTURE) IMPLANT
SUT VIC AB PLUS 45CM 1-MO-4 (SUTURE) ×2 IMPLANT
SYR 30ML LL (SYRINGE) ×6 IMPLANT
TIBIAL BASEPLATE SZ 6 (Knees) ×2 IMPLANT
TIP FAN IRRIG PULSAVAC PLUS (DISPOSABLE) ×1 IMPLANT
WATER STERILE IRR 1000ML POUR (IV SOLUTION) ×2 IMPLANT
WRAPON POLAR PAD KNEE (MISCELLANEOUS) ×2

## 2021-01-18 NOTE — Anesthesia Preprocedure Evaluation (Signed)
Anesthesia Evaluation  Patient identified by MRN, date of birth, ID band Patient awake    Reviewed: Allergy & Precautions, H&P , NPO status , Patient's Chart, lab work & pertinent test results  History of Anesthesia Complications Negative for: history of anesthetic complications  Airway Mallampati: III  TM Distance: >3 FB Neck ROM: limited    Dental  (+) Chipped, Poor Dentition, Missing, Partial Upper, Dental Advidsory Given   Pulmonary neg pulmonary ROS, neg shortness of breath,    Pulmonary exam normal        Cardiovascular Exercise Tolerance: Good hypertension, (-) angina+ CAD, + CABG and +CHF  (-) Past MI, (-) Cardiac Stents and (-) DOE Normal cardiovascular exam(-) dysrhythmias + Valvular Problems/Murmurs      Neuro/Psych negative neurological ROS  negative psych ROS   GI/Hepatic negative GI ROS, Neg liver ROS, neg GERD  ,  Endo/Other  negative endocrine ROS  Renal/GU negative Renal ROS  negative genitourinary   Musculoskeletal   Abdominal   Peds  Hematology negative hematology ROS (+)   Anesthesia Other Findings Past Medical History: No date: Arthritis     Comment:  "hands, knees" (01/09/2017) No date: CAD (coronary artery disease)     Comment:  a. 12/2016 Cath: LM nl, LAD 95p, 8m, 60/90d, LCX 80p/m,               162m, OM1 4m, OM2 90, RCA 99p, Ef 55-65%; b. 12/2016 CABG              x 4 (LIMA->mLAD, VG->dLAD, VG->OM1->OM2); c. 11/2018 MV:               EF 66%, no ischemia. No date: Essential hypertension No date: High cholesterol No date: History of echocardiogram     Comment:  a. 12/2016 Echo: EF 65-70%.  Past Surgical History: 01/09/2017: CARDIAC CATHETERIZATION     Comment:  "scheduling me for [facility]" 11/09/2016: COLONOSCOPY WITH PROPOFOL; N/A     Comment:  Procedure: COLONOSCOPY WITH PROPOFOL;  Surgeon: Earline Mayotte, MD;  Location: [facility] ENDOSCOPY;  Service:               Endoscopy;   Laterality: N/A; 01/12/2017: CORONARY ARTERY BYPASS GRAFT; N/A     Comment:  CABG x 4: LIMA to mLAD, SVG to dLAD, sequential SVG to               OM1-OM2, EVH via left thigh and leg;  Surgeon: Purcell Nails, MD;  Location: [facility] OR;  Service: Open Heart               Surgery;  Laterality: N/A; 01/12/2017: INTRAOPERATIVE TRANSESOPHAGEAL ECHOCARDIOGRAM; N/A     Comment:  Procedure: INTRAOPERATIVE TRANSESOPHAGEAL               ECHOCARDIOGRAM;  Surgeon: Purcell Nails, MD;                Location: [facility] OR;  Service: Open Heart Surgery;                Laterality: N/A; 01/09/2017: LEFT HEART CATH AND CORONARY ANGIOGRAPHY; Left     Comment:  Procedure: Left Heart Cath and Coronary Angiography;                Surgeon: Iran Ouch, MD;  Location: [facility]  CV LAB;  Service: Cardiovascular;  Laterality: Left;  BMI    Body Mass Index: 31.53 kg/m      Reproductive/Obstetrics negative OB ROS                             Anesthesia Physical  Anesthesia Plan  ASA: 3  Anesthesia Plan: Spinal   Post-op Pain Management:    Induction: Intravenous  PONV Risk Score and Plan: Propofol infusion and TIVA  Airway Management Planned: Natural Airway and Simple Face Mask  Additional Equipment:   Intra-op Plan:   Post-operative Plan:   Informed Consent: I have reviewed the patients History and Physical, chart, labs and discussed the procedure including the risks, benefits and alternatives for the proposed anesthesia with the patient or authorized representative who has indicated his/her understanding and acceptance.     Dental Advisory Given  Plan Discussed with: Anesthesiologist, CRNA and Surgeon  Anesthesia Plan Comments: (Patient consented for risks of anesthesia including but not limited to:  - adverse reactions to medications - risk of intubation if required - damage to eyes, teeth, lips or other oral mucosa - nerve damage  due to positioning  - sore throat or hoarseness - Damage to heart, brain, nerves, lungs, other parts of body or loss of life  Patient voiced understanding.)        Anesthesia Quick Evaluation

## 2021-01-18 NOTE — Progress Notes (Signed)
Recd pt from pacu with iv infiltrated - removed and coveed with clean dry dressing - pt c/o tenderness to area - no redness warmth noted - iv team consult placed

## 2021-01-18 NOTE — Anesthesia Procedure Notes (Signed)
Procedure Name: Intubation Date/Time: 01/18/2021 12:52 PM Performed by: Joanette Gula, Maximillion Gill, CRNA Pre-anesthesia Checklist: Patient identified, Emergency Drugs available, Suction available and Patient being monitored Patient Re-evaluated:Patient Re-evaluated prior to induction Oxygen Delivery Method: Circle system utilized Preoxygenation: Pre-oxygenation with 100% oxygen Induction Type: IV induction Ventilation: Mask ventilation without difficulty Laryngoscope Size: McGraph, 3 and 4 Grade View: Grade I Tube type: Oral Tube size: 7.5 mm Number of attempts: 1 Airway Equipment and Method: Stylet Placement Confirmation: ETT inserted through vocal cords under direct vision, positive ETCO2 and breath sounds checked- equal and bilateral Secured at: 22 cm Tube secured with: Tape Dental Injury: Teeth and Oropharynx as per pre-operative assessment

## 2021-01-18 NOTE — Op Note (Signed)
DATE OF SURGERY:  01/18/2021 TIME: 2:47 PM  PATIENT NAME:  Dale Cook.   AGE: 75 y.o.    PRE-OPERATIVE DIAGNOSIS:  M17.11 Unilateral primary osteoarthritis, right knee  POST-OPERATIVE DIAGNOSIS:  Same  PROCEDURE:  Procedure(s): TOTAL KNEE ARTHROPLASTY, RIGHT  SURGEON:  Lyndle Herrlich, MD   ASSISTANT:  [name],  PA-C  OPERATIVE IMPLANTS: Katrinka Blazing [employer] Nephew, Cruciate Retaining Oxinium Femoral component size  7, Fixed Bearing Tray size 6, Patella polyethylene 3-peg oval button size 35 mm, with a 9 mm HighFlex insert.   PREOPERATIVE INDICATIONS:  Dale Cook. is an 75 y.o. male who has a diagnosis of M17.11 Unilateral primary osteoarthritis, right knee and elected for a total knee arthroplasty after failing nonoperative treatment, including activity modification, pain medication, physical therapy and injections who has significant impairment of their activities of daily living.  Radiographs have demonstrated tricompartmental osteoarthritis joint space narrowing, osteophytes, subchondral sclerosis and cyst formation.  The risks, benefits, and alternatives were discussed at length including but not limited to the risks of infection, bleeding, nerve or blood vessel injury, knee stiffness, fracture, dislocation, loosening or failure of the hardware and the need for further surgery. Medical risks include but not limited to DVT and pulmonary embolism, myocardial infarction, stroke, pneumonia, respiratory failure and death. I discussed these risks with the patient in my office prior to the date of surgery. They understood these risks and were willing to proceed.  OPERATIVE FINDINGS AND UNIQUE ASPECTS OF THE CASE:  All three compartments with advanced and severe degenerative changes, large osteophytes and an abundance of synovial fluid. Significant deformity was also noted. A decision was made to proceed with total knee arthroplasty.   OPERATIVE DESCRIPTION:  The patient  was brought to the operative room and placed in a supine position after undergoing placement of a general anesthetic. IV antibiotics were given. Patient received tranexamic acid. The lower extremity was prepped and draped in the usual sterile fashion.  A time out was performed to verify the patient's name, date of birth, medical record number, correct site of surgery and correct procedure to be performed. The timeout was also used to confirm the patient received antibiotics and that appropriate instruments, implants and radiographs studies were available in the room.  The leg was elevated and exsanguinated with an Esmarch and the tourniquet was inflated to 250 mmHg.  A midline incision was made over the left knee.. A medial parapatellar arthrotomy was then made and the patella subluxed laterally and the knee was brought into 90 of flexion. Hoffa's fat pad along with the anterior cruciate ligament was resected and the medial joint line was exposed.  Attention was then turned to preparation of the patella. The thickness of the patella was measured with a caliper, the diameter measured with the patella templates.  The patella resection was then made with an oscillating saw using the patella cutting guide.  The 35 mm button fit appropriately.  3 peg holes for the patella component were then drilled.  The extramedullary tibial cutting guide was then placed using the anterior tibial crest and second ray of the foot as a reference.  The tibial cutting guide was adjusted to allow for appropriate posterior slope.  The tibial cutting block was pinned into position. The slotted stylus was used to measure the proximal tibial resection of 9 mm off the high lateral side. Care was taken during the tibial resection to protect the medial and collateral ligaments.  The resected tibial bone  was removed.  The distal femur was resected using the intramedullary cutting guide.  Care was taken to protect the collateral ligaments  during distal femoral resection.  The distal femoral resection was performed with an oscillating saw. The femoral cutting guide was then removed. Extension gap was measured with a 9 mm spacer block and alignment and extension was confirmed using a long alignment rod. The femur was sized to be a 7. Rotation of the referencing guide was checked with the epicondylar axis and Whitesides line. Then the 4-in-1 cutting jig was then applied to the distal femur. A stylus was used to confirm that the anterior femur would not be notched.   Then the anterior, posterior and chamfer femoral cuts were then made with an oscillating saw.  The knee was distracted and all posterior osteophytes were removed.  The flexion gap was then measured with a flexion spacer block and long alignment rod and was found to be symmetric with the extension gap and perpendicular to mechanical axis of the tibia.  The proximal tibia plateau was then sized with trial trays. The best coverage was achieved with a size 6. This tibial tray was then pinned into position. The proximal tibia was then prepared with the keel punch.  After tibial preparation was completed, all trial components were inserted with polyethylene trials. The knee achieved full extension and flexed to 120 degrees. Ligament were stable to varus and valgus at full extension as well as 30, 60 and 90 degrees of flexion.   The trials were then placed. Knee was taken through a full range of motion and deemed to be stable with the trial components. All trial components were then removed.  The joint was copiously irrigated with pulse lavage.  The final total knee arthroplasty components were then cemented into place. The knee was held in extension while cement was allowed to cure.The knee was taken through a range of motion and the patella tracked well and the knee was again irrigated copiously.  The knee capsule was then injected with Exparel.  The medial arthrotomy was closed with #1  Vicryl and #2 Quill. The subcutaneous tissue closed with  2-0 vicryl, and skin approximated with staples.  A dry sterile and compressive dressing was applied.  A Polar Care was applied to the operative knee.  The patient was awakened and brought to the PACU in stable and satisfactory condition.  All sharp, lap and instrument counts were correct at the conclusion the case. I spoke with the patient's family in the postop consultation room to let them know the case had been performed without complication and the patient was stable in recovery room.   Total tourniquet time was 66 minutes.

## 2021-01-18 NOTE — H&P (Signed)
The patient has been re-examined, and the chart reviewed, and there have been no interval changes to the documented history and physical.  Plan a right total knee today.  Anesthesia is consulted regarding a peripheral nerve block for post-operative pain.  The risks, benefits, and alternatives have been discussed at length, and the patient is willing to proceed.     

## 2021-01-18 NOTE — Anesthesia Procedure Notes (Signed)
Anesthesia Regional Block: Adductor canal block   Pre-Anesthetic Checklist: , timeout performed,  Correct Patient, Correct Site, Correct Laterality,  Correct Procedure, Correct Position, site marked,  Risks and benefits discussed,  Surgical consent,  Pre-op evaluation,  At surgeon's request and post-op pain management  Laterality: Right and Lower  Prep: chloraprep, alcohol swabs       Needles:  Injection technique: Single-shot  Needle Type: Echogenic Needle     Needle Length: 9cm  Needle Gauge: 21     Additional Needles:   Procedures:,,,, ultrasound used (permanent image in chart),,    Narrative:  Start time: 01/18/2021 10:51 AM End time: 01/18/2021 10:53 AM Injection made incrementally with aspirations every 5 mL.  Performed by: Personally  Anesthesiologist: Lenard Simmer, MD  Additional Notes: Functioning IV was confirmed and monitors were applied.  A 60mm 22ga Stimuplex needle was used. Sterile prep and drape,hand hygiene and sterile gloves were used.  Negative aspiration and negative test dose prior to incremental administration of local anesthetic. The patient tolerated the procedure well.

## 2021-01-18 NOTE — Transfer of Care (Signed)
Immediate Anesthesia Transfer of Care Note  Patient: Dale Cook.  Procedure(s) Performed: TOTAL KNEE ARTHROPLASTY (Right: Knee)  Patient Location: PACU  Anesthesia Type:General  Level of Consciousness: drowsy  Airway & Oxygen Therapy: Patient Spontanous Breathing and Patient connected to face mask oxygen  Post-op Assessment: Report given to RN and Post -op Vital signs reviewed and stable  Post vital signs: Reviewed and stable  Last Vitals:  Vitals Value Taken Time  BP 104/59 01/18/21 1452  Temp 36.1 C 01/18/21 1453  Pulse 52 01/18/21 1455  Resp 21 01/18/21 1455  SpO2 98 % 01/18/21 1455  Vitals shown include unvalidated device data.  Last Pain:  Vitals:   01/18/21 1453  TempSrc:   PainSc: 0-No pain         Complications: No notable events documented.

## 2021-01-18 NOTE — Plan of Care (Signed)
  Problem: Education: Goal: Knowledge of General Education information will improve Description: Including pain rating scale, medication(s)/side effects and non-pharmacologic comfort measures Outcome: Progressing   Problem: Health Behavior/Discharge Planning: Goal: Ability to manage health-related needs will improve Outcome: Progressing   Problem: Clinical Measurements: Goal: Ability to maintain clinical measurements within normal limits will improve Outcome: Progressing Goal: Will remain free from infection Outcome: Progressing Goal: Diagnostic test results will improve Outcome: Progressing Goal: Respiratory complications will improve Outcome: Progressing Goal: Cardiovascular complication will be avoided Outcome: Progressing   Problem: Activity: Goal: Risk for activity intolerance will decrease Outcome: Progressing   Problem: Nutrition: Goal: Adequate nutrition will be maintained Outcome: Progressing   Problem: Coping: Goal: Level of anxiety will decrease Outcome: Progressing   Problem: Elimination: Goal: Will not experience complications related to bowel motility Outcome: Progressing Goal: Will not experience complications related to urinary retention Outcome: Progressing   Problem: Safety: Goal: Ability to remain free from injury will improve Outcome: Progressing   Problem: Education: Goal: Knowledge of the prescribed therapeutic regimen will improve Outcome: Progressing Goal: Individualized Educational Video(s) Outcome: Progressing   Problem: Clinical Measurements: Goal: Postoperative complications will be avoided or minimized Outcome: Progressing

## 2021-01-19 ENCOUNTER — Encounter: Payer: Self-pay | Admitting: Orthopedic Surgery

## 2021-01-19 DIAGNOSIS — M1711 Unilateral primary osteoarthritis, right knee: Secondary | ICD-10-CM | POA: Diagnosis not present

## 2021-01-19 LAB — BASIC METABOLIC PANEL
Anion gap: 8 (ref 5–15)
BUN: 13 mg/dL (ref 8–23)
CO2: 28 mmol/L (ref 22–32)
Calcium: 8.8 mg/dL — ABNORMAL LOW (ref 8.9–10.3)
Chloride: 101 mmol/L (ref 98–111)
Creatinine, Ser: 1.26 mg/dL — ABNORMAL HIGH (ref 0.61–1.24)
GFR, Estimated: >60 mL/min (ref >60)
Glucose, Bld: 183 mg/dL — ABNORMAL HIGH (ref 70–99)
Potassium: 5 mmol/L (ref 3.5–5.1)
Sodium: 137 mmol/L (ref 135–145)

## 2021-01-19 LAB — CBC
HCT: 43.2 % (ref 39.0–52.0)
Hemoglobin: 14.2 g/dL (ref 13.0–17.0)
MCH: 28.3 pg (ref 26.0–34.0)
MCHC: 32.9 g/dL (ref 30.0–36.0)
MCV: 86.1 fL (ref 80.0–100.0)
Platelets: 182 10*3/uL (ref 150–400)
RBC: 5.02 MIL/uL (ref 4.22–5.81)
RDW: 14 % (ref 11.5–15.5)
WBC: 16.4 10*3/uL — ABNORMAL HIGH (ref 4.0–10.5)
nRBC: 0 % (ref 0.0–0.2)

## 2021-01-19 NOTE — Progress Notes (Signed)
Physical Therapy Treatment Patient Details Name: Dale Cook. MRN: [id number] DOB: 1946-02-02 Today's Date: 01/19/2021    History of Present Illness Jeancarlos Marchena is a 74yoM who presents 7/25 for elective Rt TKA. At baseline, pt is fully independent at baseline, no device needed for AMB. Has been practicing his exercises and stairs prior to surgery.    PT Comments    Pt in bed upon entry, reports AMB to HR with NSG earlier, now back in bed after >2 hours in recliner. HEP reviewed in full with handout, some difficulty with isolated activation of quads in quad set, still needs some acitve assistance for full heel slides but successful with using gait belt or blanket. Pt is ahead of typical progression after this procedure, doing fairly well. More pain this afternoon, but still doing well in general. Will progress stairs next day to prepare for DC to home.   Follow Up Recommendations  Follow surgeon's recommendation for DC plan and follow-up therapies;Supervision - Intermittent     Equipment Recommendations  Rolling walker with 5" wheels;3in1 (PT)    Recommendations for Other Services       Precautions / Restrictions Precautions Precautions: Fall;Knee Precaution Booklet Issued: Yes (comment) Restrictions Weight Bearing Restrictions: Yes RLE Weight Bearing: Weight bearing as tolerated    Mobility  Bed Mobility Overal bed mobility: Modified Independent             General bed mobility comments: in bed throughout session    Transfers Overall transfer level: Needs assistance Equipment used: Rolling walker (2 wheeled) Transfers: Sit to/from Stand Sit to Stand: Supervision            Ambulation/Gait   Gait Distance (Feet): 320 Feet Assistive device: Rolling walker (2 wheeled) Gait Pattern/deviations: WFL(Within Functional Limits) Gait velocity: 0.9m/s   General Gait Details: progressed to step-througout 2-point gait with RW with warm-up and verbal  cuing   Stairs Stairs: Yes   Stair Management: Two rails;Step to pattern Number of Stairs: 8     Wheelchair Mobility    Modified [place] (Stroke Patients Only)       Balance Overall balance assessment: Modified Independent                                          Cognition Arousal/Alertness: Awake/alert Behavior During Therapy: WFL for tasks assessed/performed Overall Cognitive Status: Within Functional Limits for tasks assessed                                        Exercises Total Joint Exercises Ankle Circles/Pumps: AROM;Both;Supine;10 reps Quad Sets: AROM;10 reps;Supine;Limitations Quad Sets Limitations: struggles with isolated activation, but improved with slight knee roll for proprioceptive feedback Gluteal Sets: AROM;Right;5 reps;Limitations Gluteal Sets Limitations: performed inititally when cued for quad sets Short Arc Quad: AROM;Right;15 reps;Supine Heel Slides: AAROM;Right;15 reps;Supine;Limitations Heel Slides Limitations: educated on self assist with blanket adn gait belt Hip ABduction/ADduction: Right;Supine;5 reps;AROM Straight Leg Raises: AROM;Right;5 reps;Supine Long Arc Quad: AROM;Right;Seated;5 reps Goniometric ROM: 17-88 degrees Rt knee flexion ROM    General Comments        Pertinent Vitals/Pain Pain Assessment: 0-10 Pain Score: 7  Pain Location: soreness Pain Descriptors / Indicators: Aching Pain Intervention(s): Limited activity within patient's tolerance;Repositioned    Home Living Family/patient expects to be discharged  to:: Private residence Living Arrangements: Alone Available Help at Discharge: Family (Son will be stayign with him after surgery) Type of Home: Apartment Home Access: Stairs to enter Entrance Stairs-Rails: Can reach both;Left;Right Home Layout: One level Home Equipment: Cane - single point      Prior Function Level of Independence: Independent      Comments: still drives,  groceries   PT Goals (current goals can now be found in the care plan section) Acute Rehab PT Goals Patient Stated Goal: Return to home, recapture flawless walking mechanics PT Goal Formulation: With patient Time For Goal Achievement: 02/02/21 Potential to Achieve Goals: Good Progress towards PT goals: Progressing toward goals    Frequency    BID      PT Plan Current plan remains appropriate    Co-evaluation              AM-PAC PT "6 Clicks" Mobility   Outcome Measure  Help needed turning from your back to your side while in a flat bed without using bedrails?: None Help needed moving from lying on your back to sitting on the side of a flat bed without using bedrails?: None Help needed moving to and from a bed to a chair (including a wheelchair)?: A Little Help needed standing up from a chair using your arms (e.g., wheelchair or bedside chair)?: A Little Help needed to walk in hospital room?: A Little Help needed climbing 3-5 steps with a railing? : A Little 6 Click Score: 20    End of Session Equipment Utilized During Treatment: Gait belt Activity Tolerance: Patient tolerated treatment well Patient left: in bed;with call bell/phone within reach;with bed alarm set Nurse Communication: Other (comment) (dressing has slide down, wound not protected) PT Visit Diagnosis: Difficulty in walking, not elsewhere classified (R26.2);Muscle weakness (generalized) (M62.81)     Time: 1350-1405 PT Time Calculation (min) (ACUTE ONLY): 15 min  Charges:  $Therapeutic Exercise: 8-22 mins                    2:15 PM, 01/19/21 Rosamaria Lints, PT, DPT Physical Therapist - [facility]  [phone] (ASCOM)     Jeniffer Culliver C 01/19/2021, 2:13 PM

## 2021-01-19 NOTE — TOC Initial Note (Signed)
Transition of Care (TOC) - Initial/Assessment Note    Patient Details  Name: Dale Cook. MRN: [id number] Date of Birth: 27-Jun-1946  Transition of Care [place] [facility]) CM/SW Contact:    Su Hilt, RN Phone Number: 01/19/2021, 2:17 PM  Clinical Narrative:                  Met with the patient at the bedside, the patient has transportation with his son, he needs a Rolling walker and a 3 in 1, [employer] will bring into the room prior to DC, He has Outpatient PT set up on 8/12 at the office but would like [facility] prior to Set him up with [facility] He can afford his medications        Patient Goals and CMS Choice        Expected Discharge Plan and Services                                                Prior Living Arrangements/Services                       Activities of Daily Living Home Assistive Devices/Equipment: Gilford Rile (specify type), Built-in shower seat, Hospital bed ADL Screening (condition at time of admission) Patient's cognitive ability adequate to safely complete daily activities?: Yes Is the patient deaf or have difficulty hearing?: No Does the patient have difficulty seeing, even when wearing glasses/contacts?: No Does the patient have difficulty concentrating, remembering, or making decisions?: No Patient able to express need for assistance with ADLs?: Yes Does the patient have difficulty dressing or bathing?: No Independently performs ADLs?: Yes (appropriate for developmental age) Does the patient have difficulty walking or climbing stairs?: No Weakness of Legs: Right Weakness of Arms/Hands: None  Permission Sought/Granted                  Emotional Assessment              Admission diagnosis:  History of total knee arthroplasty, right [Z96.651] Patient Active Problem List   Diagnosis Date Noted   History of total knee arthroplasty, right 01/18/2021   Chest pain 12/07/2018   Hyperlipidemia with target low density  lipoprotein (LDL) cholesterol less than 70 mg/dL 01/16/2017   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   Coronary artery disease involving native coronary artery with angina pectoris ([place]) 01/09/2017   Unstable angina ([place])    Essential hypertension    Encounter for screening colonoscopy 09/21/2016   PCP:  Albina Billet, MD Pharmacy:   [id number]: [phone]: [phone] [facility] ([facility]) [place] [id number]: [phone]: [phone]    Social Determinants of Health (SDOH) Interventions    Readmission Risk Interventions No flowsheet data found.

## 2021-01-19 NOTE — Evaluation (Signed)
Physical Therapy Evaluation Patient Details Name: Dale Cook. MRN: [id number] DOB: 01-Dec-1945 Today's Date: 01/19/2021   History of Present Illness  Dale Cook is a 41yoM who presents 7/25 for elective Rt TKA. At baseline, pt is fully independent at baseline, no device needed for AMB. Has been practicing his exercises and stairs prior to surgery.  Clinical Impression  Pt admitted with above diagnosis. Pt currently with functional limitations due to the deficits listed below (see "PT Problem List"). Upon entry, pt in bed, awake and agreeable to participate. The pt is alert, pleasant, interactive, and able to provide info regarding prior level of function, both in tolerance and independence. Pain is well controlled. Pt educated on precautions and part of HEP. Pt requires no physical assist for any mobility this date. Pt able to AMB >383f, climb 8 stairs (he has 17 to enter his apartment). Pt in recliner at end of session. Patient's performance this date reveals decreased ability, independence, and tolerance in performing all basic mobility required for performance of activities of daily living. Pt requires additional DME, close physical assistance, and cues for safe participate in mobility. Pt will benefit from skilled PT intervention to increase independence and safety with basic mobility in preparation for discharge to the venue listed below.  Most PT goals met, but will finish HEP education with handout this afternoon.       Follow Up Recommendations Follow surgeon's recommendation for DC plan and follow-up therapies;Supervision - Intermittent    Equipment Recommendations  Rolling walker with 5" wheels;3in1 (PT)    Recommendations for Other Services       Precautions / Restrictions Precautions Precautions: Fall;Knee Precaution Booklet Issued: No Restrictions Weight Bearing Restrictions: Yes RLE Weight Bearing: Weight bearing as tolerated      Mobility  Bed  Mobility Overal bed mobility: Modified Independent                  Transfers Overall transfer level: Needs assistance Equipment used: Rolling walker (2 wheeled) Transfers: Sit to/from Stand Sit to Stand: Supervision            Ambulation/Gait   Gait Distance (Feet): 320 Feet Assistive device: Rolling walker (2 wheeled) Gait Pattern/deviations: WFL(Within Functional Limits) Gait velocity: 0.561m   General Gait Details: progressed to step-througout 2-point gait with RW with warm-up and verbal cuing  Stairs Stairs: Yes   Stair Management: Two rails;Step to pattern Number of Stairs: 8    Wheelchair Mobility    Modified [place] (Stroke Patients Only)       Balance Overall balance assessment: Modified Independent                                           Pertinent Vitals/Pain Pain Assessment: 0-10 Pain Score: 1  Pain Location: soreness Pain Intervention(s): Monitored during session;Limited activity within patient's tolerance    Home Living Family/patient expects to be discharged to:: Private residence Living Arrangements: Alone Available Help at Discharge: Family (Son will be stayign with him after surgery) Type of Home: Apartment Home Access: Stairs to enter Entrance Stairs-Rails: Can reach both;Left;Right Entrance Stairs-Number of Steps: 17 Home Layout: One level Home Equipment: Cane - single point      Prior Function Level of Independence: Independent         Comments: still drives, groceries     Hand Dominance   Dominant Hand: Right  Extremity/Trunk Assessment   Upper Extremity Assessment Upper Extremity Assessment: Overall WFL for tasks assessed    Lower Extremity Assessment Lower Extremity Assessment: Overall WFL for tasks assessed    Cervical / Trunk Assessment Cervical / Trunk Assessment: Normal  Communication   Communication: No difficulties  Cognition Arousal/Alertness: Awake/alert Behavior During  Therapy: WFL for tasks assessed/performed Overall Cognitive Status: Within Functional Limits for tasks assessed                                        General Comments      Exercises Total Joint Exercises Short Arc Quad: AROM;Right;15 reps;Supine Heel Slides: AAROM;Right;15 reps;Supine Hip ABduction/ADduction: AAROM;Right;15 reps;Supine Long Arc Quad: AROM;Right;Seated;5 reps Goniometric ROM: 17-88 degrees Rt knee flexion ROM   Assessment/Plan    PT Assessment Patient needs continued PT services  PT Problem List Decreased strength;Decreased range of motion;Decreased activity tolerance;Decreased balance;Decreased mobility;Decreased coordination;Decreased knowledge of use of DME;Decreased safety awareness;Decreased knowledge of precautions       PT Treatment Interventions DME instruction;Balance training;Gait training;Stair training;Functional mobility training;Therapeutic activities;Therapeutic exercise;Patient/family education    PT Goals (Current goals can be found in the Care Plan section)  Acute Rehab PT Goals Patient Stated Goal: Return to home, recapture flawless walking mechanics PT Goal Formulation: With patient Time For Goal Achievement: 02/02/21 Potential to Achieve Goals: Good    Frequency BID   Barriers to discharge        Co-evaluation               AM-PAC PT "6 Clicks" Mobility  Outcome Measure Help needed turning from your back to your side while in a flat bed without using bedrails?: None Help needed moving from lying on your back to sitting on the side of a flat bed without using bedrails?: None Help needed moving to and from a bed to a chair (including a wheelchair)?: A Little Help needed standing up from a chair using your arms (e.g., wheelchair or bedside chair)?: A Little Help needed to walk in hospital room?: A Little Help needed climbing 3-5 steps with a railing? : A Little 6 Click Score: 20    End of Session Equipment  Utilized During Treatment: Gait belt Activity Tolerance: Patient tolerated treatment well Patient left: in chair;with call bell/phone within reach Nurse Communication: Other (comment) (dressing has slide down, wound not protected) PT Visit Diagnosis: Difficulty in walking, not elsewhere classified (R26.2);Muscle weakness (generalized) (M62.81)    Time: 9323-5573 PT Time Calculation (min) (ACUTE ONLY): 28 min   Charges:   PT Evaluation $PT Eval Low Complexity: 1 Low PT Treatments $Gait Training: 8-22 mins $Therapeutic Exercise: 8-22 mins       12:52 PM, 01/19/21 Etta Grandchild, PT, DPT Physical Therapist - [facility]  [phone] ([place])    [place] C 01/19/2021, 12:49 PM

## 2021-01-19 NOTE — Progress Notes (Signed)
  Subjective:  Patient reports pain as mild.  No other complaints.  Objective:   VITALS:   Vitals:   01/18/21 2105 01/19/21 0349 01/19/21 0728 01/19/21 1146  BP: 137/70 (!) 147/67 135/64 (!) 137/58  Pulse: 63 70 70 75  Resp: 18 18 18 19   Temp: 97.7 F (36.5 C) 97.8 F (36.6 C) 98.2 F (36.8 C) 97.9 F (36.6 C)  TempSrc:  Oral  Oral  SpO2: 97% 98% 95% 97%  Weight:      Height:        PHYSICAL EXAM:  ABD soft Sensation intact distally Dorsiflexion/Plantar flexion intact Incision: dressing C/D/I Compartment soft  LABS  Results for orders placed or performed during the hospital encounter of 01/18/21 (from the past 24 hour(s))  CBC     Status: Abnormal   Collection Time: 01/19/21  5:49 AM  Result Value Ref Range   WBC 16.4 (H) 4.0 - 10.5 K/uL   RBC 5.02 4.22 - 5.81 MIL/uL   Hemoglobin 14.2 13.0 - 17.0 g/dL   HCT 01/21/21 68.0 - 32.1 %   MCV 86.1 80.0 - 100.0 fL   MCH 28.3 26.0 - 34.0 pg   MCHC 32.9 30.0 - 36.0 g/dL   RDW 22.4 82.5 - 00.3 %   Platelets 182 150 - 400 K/uL   nRBC 0.0 0.0 - 0.2 %  Basic metabolic panel     Status: Abnormal   Collection Time: 01/19/21  5:49 AM  Result Value Ref Range   Sodium 137 135 - 145 mmol/L   Potassium 5.0 3.5 - 5.1 mmol/L   Chloride 101 98 - 111 mmol/L   CO2 28 22 - 32 mmol/L   Glucose, Bld 183 (H) 70 - 99 mg/dL   BUN 13 8 - 23 mg/dL   Creatinine, Ser 01/21/21 (H) 0.61 - 1.24 mg/dL   Calcium 8.8 (L) 8.9 - 10.3 mg/dL   GFR, Estimated 8.88 >91 mL/min   Anion gap 8 5 - 15    DG Knee Right Port  Result Date: 01/18/2021 CLINICAL DATA:  Postop right knee arthroplasty EXAM: PORTABLE RIGHT KNEE - 1-2 VIEW COMPARISON:  None. FINDINGS: There is a 3 component total knee arthroplasty without evidence of loosening or fracture. Expected soft tissue changes with joint effusion. Normal alignment. IMPRESSION: Right total knee arthroplasty without evidence of immediate hardware complication. Electronically Signed   By: 01/20/2021   On: 01/18/2021  16:57   01/20/2021 OR NERVE BLOCK-IMAGE ONLY [facility])  Result Date: 01/18/2021 There is no interpretation for this exam.  This order is for images obtained during a surgical procedure.  Please See "Surgeries" Tab for more information regarding the procedure.    Assessment/Plan: 1 Day Post-Op   Active Problems:   History of total knee arthroplasty, right   Up with therapy Plan for discharge tomorrow Outpatient PT Dressing change tomorrow   01/20/2021 , MD 01/19/2021, 12:38 PM

## 2021-01-19 NOTE — Progress Notes (Signed)
Chaplain requested to visit with patient. Emotional support, prayer for healing and comfort.

## 2021-01-20 DIAGNOSIS — M1711 Unilateral primary osteoarthritis, right knee: Secondary | ICD-10-CM | POA: Diagnosis not present

## 2021-01-20 LAB — CBC
HCT: 38.7 % — ABNORMAL LOW (ref 39.0–52.0)
Hemoglobin: 12.9 g/dL — ABNORMAL LOW (ref 13.0–17.0)
MCH: 28.5 pg (ref 26.0–34.0)
MCHC: 33.3 g/dL (ref 30.0–36.0)
MCV: 85.4 fL (ref 80.0–100.0)
Platelets: 169 10*3/uL (ref 150–400)
RBC: 4.53 MIL/uL (ref 4.22–5.81)
RDW: 14.3 % (ref 11.5–15.5)
WBC: 18.5 10*3/uL — ABNORMAL HIGH (ref 4.0–10.5)
nRBC: 0 % (ref 0.0–0.2)

## 2021-01-20 MED ORDER — ASPIRIN 81 MG PO CHEW: 81.0000 mg | CHEWABLE_TABLET | Freq: Two times a day (BID) | ORAL | 0 refills | Status: DC

## 2021-01-20 MED ORDER — OXYCODONE-ACETAMINOPHEN 5-325 MG PO TABS: 1.0000 | ORAL_TABLET | ORAL | 0 refills | Status: DC | PRN

## 2021-01-20 MED ORDER — DOCUSATE SODIUM 100 MG PO CAPS: 100.0000 mg | ORAL_CAPSULE | Freq: Two times a day (BID) | ORAL | 0 refills | Status: DC

## 2021-01-20 MED ORDER — METHOCARBAMOL 500 MG PO TABS: 500.0000 mg | ORAL_TABLET | Freq: Three times a day (TID) | ORAL | 1 refills | Status: DC

## 2021-01-20 NOTE — Discharge Instructions (Signed)
Continue weight bear as tolerated on the right lower extremity.    Elevate the right lower extremity whenever possible and continue the polar care while elevating the extremity. Patient may shower. No bath or submerging the wound.    Take aspirin as directed for blood clot prevention.  Continue to work on knee range of motion exercises at home as instructed by physical therapy. Continue to use a walker for assistance with ambulation until cleared by physical therapy.  Call [phone] with any questions, such as fever > 101.5 degrees, drainage from the wound or shortness of breath.  

## 2021-01-20 NOTE — Progress Notes (Signed)
Physical Therapy Treatment Patient Details Name: Dale Cook. MRN: [id number] DOB: [birth year] Today's Date: 01/20/2021    History of Present Illness Dale Cook is a 45yoM who presents 7/25 for elective Rt TKA. At baseline, pt is fully independent at baseline, no device needed for AMB. Has been practicing his exercises and stairs prior to surgery.    PT Comments    Continued with pt/caregiver education of home exercise program, pt still requiring alternative technique for completion. Pt able to complete bed mobility, transfers, and AMB with modified independence. Pt has completed stairs training adequately for safe DC entry to home. Pt able to complete entire session without increased pain. AMB steadily improving, but remains antalgic, asymmetrical, slow, and dependent of assistive device. Pt continues to demonstrate adequate safety awareness. Pt has achieved all rehab goals. Pt left in chair at end of session, all needs met.    Follow Up Recommendations  Follow surgeon's recommendation for DC plan and follow-up therapies;Supervision - Intermittent     Equipment Recommendations  Rolling walker with 5" wheels;3in1 (PT)    Recommendations for Other Services       Precautions / Restrictions Precautions Precautions: Fall;Knee Precaution Booklet Issued: Yes (comment) Restrictions RLE Weight Bearing: Weight bearing as tolerated    Mobility  Bed Mobility Overal bed mobility: Modified Independent                  Transfers Overall transfer level: Modified independent                  Ambulation/Gait Ambulation/Gait assistance: Supervision;Modified independent (Device/Increase time) Gait Distance (Feet): 340 Feet Assistive device: Rolling walker (2 wheeled) Gait Pattern/deviations: WFL(Within Functional Limits) Gait velocity: 0.32ms today (0.571m yesterday)       Stairs Stairs: Yes   Stair Management: Two rails;Step to pattern Number of Stairs:  12 General stair comments: no cues needed; pt takes 60sec SOB recovery after each 4   Wheelchair Mobility    Modified [place] (Stroke Patients Only)       Balance Overall balance assessment: Modified Independent                                          Cognition                                              Exercises Total Joint Exercises Ankle Circles/Pumps: AROM;Both;Supine;10 reps Heel Slides: AAROM;Right;15 reps;Supine;Limitations Heel Slides Limitations: self assist with gait belt Long Arc Quad: AROM;Right;10 reps;Seated Goniometric ROM: 14-91 degrees Rt knee flexion    General Comments        Pertinent Vitals/Pain Pain Assessment: 0-10 Pain Score: 2  Pain Location: soreness Pain Descriptors / Indicators: Aching    Home Living                      Prior Function            PT Goals (current goals can now be found in the care plan section) Acute Rehab PT Goals Patient Stated Goal: Return to home, recapture flawless walking mechanics PT Goal Formulation: With patient Time For Goal Achievement: 02/02/21 Potential to Achieve Goals: Good Progress towards PT goals: Goals met and updated - see care plan  Frequency    BID      PT Plan Current plan remains appropriate    Co-evaluation              AM-PAC PT "6 Clicks" Mobility   Outcome Measure  Help needed turning from your back to your side while in a flat bed without using bedrails?: None Help needed moving from lying on your back to sitting on the side of a flat bed without using bedrails?: None Help needed moving to and from a bed to a chair (including a wheelchair)?: A Little Help needed standing up from a chair using your arms (e.g., wheelchair or bedside chair)?: A Little Help needed to walk in hospital room?: A Little Help needed climbing 3-5 steps with a railing? : A Little 6 Click Score: 20    End of Session Equipment Utilized During  Treatment: Gait belt Activity Tolerance: Patient tolerated treatment well Patient left: with call bell/phone within reach;in chair Nurse Communication: Mobility status PT Visit Diagnosis: Difficulty in walking, not elsewhere classified (R26.2);Muscle weakness (generalized) (M62.81)     Time: 1157-2620 PT Time Calculation (min) (ACUTE ONLY): 19 min  Charges:  $Gait Training: 8-22 mins                    11:54 AM, 01/20/21 Dale Cook, PT, DPT Physical Therapist - [facility]  [phone] ([place])     [place] C 01/20/2021, 11:53 AM

## 2021-01-20 NOTE — Discharge Summary (Signed)
Physician Discharge Summary  Patient ID: Dale Cook. MRN: [id number] DOB/AGE: [birth year] 73 y.o.  Admit date: 01/18/2021 Discharge date: 01/20/2021  Admission Diagnoses:  M17.11 Unilateral primary osteoarthritis, right knee <principal problem not specified>  Discharge Diagnoses:  M17.11 Unilateral primary osteoarthritis, right knee Active Problems:   History of total knee arthroplasty, right   Past Medical History:  Diagnosis Date   Arthritis    "hands, knees" (01/09/2017)   CAD (coronary artery disease)    a. 12/2016 Cath: LM nl, LAD 95p, 67m, 60/90d, LCX 80p/m, 197m, OM1 40m, OM2 90, RCA 99p, Ef 55-65%; b. 12/2016 CABG x 4 (LIMA->mLAD, VG->dLAD, VG->OM1->OM2); c. 11/2018 MV: EF 66%, no ischemia.   Cardiac murmur    Grade II/VI early peaking systolic in aortic area   CHF (congestive heart failure) (HCC)    Essential hypertension    GERD (gastroesophageal reflux disease)    High cholesterol    History of echocardiogram    a. 12/2016 Echo: EF 65-70%.   Left carotid bruit    Wears dentures    partial upper    Surgeries: Procedure(s): TOTAL KNEE ARTHROPLASTY on 01/18/2021   Consultants (if any):   Discharged Condition: Improved  Hospital Course: Dale Cook. is an 75 y.o. male who was admitted 01/18/2021 with a diagnosis of  M17.11 Unilateral primary osteoarthritis, right knee <principal problem not specified> and went to the operating room on 01/18/2021 and underwent the above named procedures.    He was given perioperative antibiotics:  Anti-infectives (From admission, onward)    Start     Dose/Rate Route Frequency Ordered Stop   01/18/21 1900  ceFAZolin (ANCEF) IVPB 2g/100 mL premix        2 g 200 mL/hr over 30 Minutes Intravenous Every 6 hours 01/18/21 1649 01/19/21 0741   01/18/21 1009  ceFAZolin (ANCEF) 2-4 GM/100ML-% IVPB       Note to Pharmacy: Desma Paganini   : cabinet override      01/18/21 1009 01/19/21 0741   01/18/21 0600  ceFAZolin (ANCEF)  IVPB 2g/100 mL premix        2 g 200 mL/hr over 30 Minutes Intravenous On call to O.R. 01/17/21 2300 01/18/21 1309     .  He was given sequential compression devices, early ambulation, and aspirin for DVT prophylaxis.  He benefited maximally from the hospital stay and there were no complications.    Recent vital signs:  Vitals:   01/19/21 2352 01/20/21 0506  BP: 135/67 139/69  Pulse: 79 87  Resp: 18 18  Temp: 99 F (37.2 C) 99 F (37.2 C)  SpO2: 92% 97%    Recent laboratory studies:  Lab Results  Component Value Date   HGB 12.9 (L) 01/20/2021   HGB 14.2 01/19/2021   HGB 14.3 01/08/2021   Lab Results  Component Value Date   WBC 18.5 (H) 01/20/2021   PLT 169 01/20/2021   Lab Results  Component Value Date   INR 1.1 01/08/2021   Lab Results  Component Value Date   NA 137 01/19/2021   K 5.0 01/19/2021   CL 101 01/19/2021   CO2 28 01/19/2021   BUN 13 01/19/2021   CREATININE 1.26 (H) 01/19/2021   GLUCOSE 183 (H) 01/19/2021    Discharge Medications:   Allergies as of 01/20/2021   No Known Allergies      Medication List     STOP taking these medications    GNP Aspirin 81 MG EC tablet Generic drug:  aspirin Replaced by: aspirin 81 MG chewable tablet       TAKE these medications    amLODipine 10 MG tablet Commonly known as: NORVASC Take 1 tablet (10 mg total) by mouth daily.   aspirin 81 MG chewable tablet Chew 1 tablet (81 mg total) by mouth 2 (two) times daily. Replaces: GNP Aspirin 81 MG EC tablet   atorvastatin 40 MG tablet Commonly known as: LIPITOR Take 40 mg by mouth daily.   carvedilol 25 MG tablet Commonly known as: COREG Take 1 tablet (25 mg total) by mouth 2 (two) times daily.   dextromethorphan-guaiFENesin 30-600 MG 12hr tablet Commonly known as: MUCINEX DM Take 1 tablet by mouth 2 (two) times daily as needed.   docusate sodium 100 MG capsule Commonly known as: COLACE Take 1 capsule (100 mg total) by mouth 2 (two) times  daily.   furosemide 20 MG tablet Commonly known as: LASIX Take 1 tablet (20 mg total) by mouth daily.   isosorbide mononitrate 60 MG 24 hr tablet Commonly known as: IMDUR Take 1 tablet (60 mg total) by mouth daily.   loratadine 10 MG tablet Commonly known as: CLARITIN Take 10 mg by mouth daily as needed for allergies.   meloxicam 15 MG tablet Commonly known as: MOBIC Take 15 mg by mouth daily as needed for pain.   methocarbamol 500 MG tablet Commonly known as: Robaxin Take 1 tablet (500 mg total) by mouth 3 (three) times daily.   moexipril 15 MG tablet Commonly known as: UNIVASC Take 15 mg by mouth at bedtime.   oxyCODONE-acetaminophen 5-325 MG tablet Commonly known as: PERCOCET/ROXICET Take 1 tablet by mouth every 4 (four) hours as needed for moderate pain.   pantoprazole 40 MG tablet Commonly known as: PROTONIX TAKE 1 TABLET EVERY DAY               Durable Medical Equipment  (From admission, onward)           Start     Ordered   01/19/21 1517  For home use only DME Walker rolling  Once       Question Answer Comment  Walker: With 5 Inch Wheels   Patient needs a walker to treat with the following condition Weakness      01/19/21 1517   01/19/21 1417  For home use only DME Walker rolling  Once       Question Answer Comment  Walker: With 5 Inch Wheels   Patient needs a walker to treat with the following condition Weakness      01/19/21 1417            Diagnostic Studies: DG Knee Right Port  Result Date: 01/18/2021 CLINICAL DATA:  Postop right knee arthroplasty EXAM: PORTABLE RIGHT KNEE - 1-2 VIEW COMPARISON:  None. FINDINGS: There is a 3 component total knee arthroplasty without evidence of loosening or fracture. Expected soft tissue changes with joint effusion. Normal alignment. IMPRESSION: Right total knee arthroplasty without evidence of immediate hardware complication. Electronically Signed   By: Caprice Renshaw   On: 01/18/2021 16:57   [place] OR  NERVE BLOCK-IMAGE ONLY [facility])  Result Date: 01/18/2021 There is no interpretation for this exam.  This order is for images obtained during a surgical procedure.  Please See "Surgeries" Tab for more information regarding the procedure.    Disposition: Discharge disposition: 01-Home or Self Care            Signed: [name] ,PA-C 01/20/2021, 6:45 AM

## 2021-01-20 NOTE — Progress Notes (Signed)
  Subjective:  Patient reports pain as mild.    Objective:   VITALS:   Vitals:   01/19/21 1729 01/19/21 2005 01/19/21 2352 01/20/21 0506  BP: 106/88 (!) 114/59 135/67 139/69  Pulse: 79 75 79 87  Resp: 16 20 18 18   Temp: 98.3 F (36.8 C) 98.9 F (37.2 C) 99 F (37.2 C) 99 F (37.2 C)  TempSrc:      SpO2: 95% 91% 92% 97%  Weight:      Height:        PHYSICAL EXAM:  Neurologically intact ABD soft Neurovascular intact Sensation intact distally Intact pulses distally Dorsiflexion/Plantar flexion intact Incision: no drainage and drsg changed No cellulitis present Compartment soft  LABS  Results for orders placed or performed during the hospital encounter of 01/18/21 (from the past 24 hour(s))  CBC     Status: Abnormal   Collection Time: 01/20/21  6:12 AM  Result Value Ref Range   WBC 18.5 (H) 4.0 - 10.5 K/uL   RBC 4.53 4.22 - 5.81 MIL/uL   Hemoglobin 12.9 (L) 13.0 - 17.0 g/dL   HCT 01/22/21 (L) 98.9 - 21.1 %   MCV 85.4 80.0 - 100.0 fL   MCH 28.5 26.0 - 34.0 pg   MCHC 33.3 30.0 - 36.0 g/dL   RDW 94.1 74.0 - 81.4 %   Platelets 169 150 - 400 K/uL   nRBC 0.0 0.0 - 0.2 %    DG Knee Right Port  Result Date: 01/18/2021 CLINICAL DATA:  Postop right knee arthroplasty EXAM: PORTABLE RIGHT KNEE - 1-2 VIEW COMPARISON:  None. FINDINGS: There is a 3 component total knee arthroplasty without evidence of loosening or fracture. Expected soft tissue changes with joint effusion. Normal alignment. IMPRESSION: Right total knee arthroplasty without evidence of immediate hardware complication. Electronically Signed   By: 01/20/2021   On: 01/18/2021 16:57   01/20/2021 OR NERVE BLOCK-IMAGE ONLY [facility])  Result Date: 01/18/2021 There is no interpretation for this exam.  This order is for images obtained during a surgical procedure.  Please See "Surgeries" Tab for more information regarding the procedure.    Assessment/Plan: 2 Days Post-Op   Active Problems:   History of total knee arthroplasty,  right   Advance diet Up with therapy Discharge home today with outpatient therapy Follow up in office in 2 weeks for staple removal Call office confirm appointment [phone]   [phone] , PA-C 01/20/2021, 6:38 AM

## 2021-01-20 NOTE — Anesthesia Postprocedure Evaluation (Signed)
Anesthesia Post Note  Patient: Dale Cook.  Procedure(s) Performed: TOTAL KNEE ARTHROPLASTY (Right: Knee)  Patient location during evaluation: PACU Anesthesia Type: General Level of consciousness: awake and alert Pain management: pain level controlled Vital Signs Assessment: post-procedure vital signs reviewed and stable Respiratory status: spontaneous breathing, nonlabored ventilation, respiratory function stable and patient connected to nasal cannula oxygen Cardiovascular status: blood pressure returned to baseline and stable Postop Assessment: no apparent nausea or vomiting Anesthetic complications: no   No notable events documented.   Last Vitals:  Vitals:   01/20/21 0506 01/20/21 0821  BP: 139/69 119/61  Pulse: 87 80  Resp: 18 17  Temp: 37.2 C 37.1 C  SpO2: 97% 97%    Last Pain:  Vitals:   01/20/21 0700  TempSrc:   PainSc: 4                  Lenard Simmer

## 2021-02-20 ENCOUNTER — Emergency Department
Admission: EM | Admit: 2021-02-20 | Discharge: 2021-02-20 | Disposition: A | Discharge status: Home / Self Care | Payer: Medicare HMO | Attending: Emergency Medicine | Admitting: Emergency Medicine

## 2021-02-20 ENCOUNTER — Emergency Department: Payer: Medicare HMO

## 2021-02-20 ENCOUNTER — Other Ambulatory Visit: Payer: Self-pay

## 2021-02-20 DIAGNOSIS — R600 Localized edema: Secondary | ICD-10-CM | POA: Diagnosis not present

## 2021-02-20 DIAGNOSIS — R748 Abnormal levels of other serum enzymes: Secondary | ICD-10-CM | POA: Insufficient documentation

## 2021-02-20 DIAGNOSIS — I2510 Atherosclerotic heart disease of native coronary artery without angina pectoris: Secondary | ICD-10-CM | POA: Diagnosis not present

## 2021-02-20 DIAGNOSIS — I509 Heart failure, unspecified: Secondary | ICD-10-CM | POA: Diagnosis not present

## 2021-02-20 DIAGNOSIS — Z96651 Presence of right artificial knee joint: Secondary | ICD-10-CM | POA: Insufficient documentation

## 2021-02-20 DIAGNOSIS — Z79899 Other long term (current) drug therapy: Secondary | ICD-10-CM | POA: Diagnosis not present

## 2021-02-20 DIAGNOSIS — Z7982 Long term (current) use of aspirin: Secondary | ICD-10-CM | POA: Insufficient documentation

## 2021-02-20 DIAGNOSIS — Z950 Presence of cardiac pacemaker: Secondary | ICD-10-CM | POA: Insufficient documentation

## 2021-02-20 DIAGNOSIS — I110 Hypertensive heart disease with heart failure: Secondary | ICD-10-CM | POA: Insufficient documentation

## 2021-02-20 DIAGNOSIS — R739 Hyperglycemia, unspecified: Secondary | ICD-10-CM

## 2021-02-20 DIAGNOSIS — R609 Edema, unspecified: Secondary | ICD-10-CM

## 2021-02-20 LAB — CBC WITH DIFFERENTIAL/PLATELET
Abs Immature Granulocytes: 0.03 10*3/uL (ref 0.00–0.07)
Basophils Absolute: 0 10*3/uL (ref 0.0–0.1)
Basophils Relative: 0 %
Eosinophils Absolute: 0.1 10*3/uL (ref 0.0–0.5)
Eosinophils Relative: 1 %
HCT: 32.1 % — ABNORMAL LOW (ref 39.0–52.0)
Hemoglobin: 10.8 g/dL — ABNORMAL LOW (ref 13.0–17.0)
Immature Granulocytes: 0 %
Lymphocytes Relative: 9 %
Lymphs Abs: 0.8 10*3/uL (ref 0.7–4.0)
MCH: 29 pg (ref 26.0–34.0)
MCHC: 33.6 g/dL (ref 30.0–36.0)
MCV: 86.1 fL (ref 80.0–100.0)
Monocytes Absolute: 1.1 10*3/uL — ABNORMAL HIGH (ref 0.1–1.0)
Monocytes Relative: 12 %
Neutro Abs: 7.3 10*3/uL (ref 1.7–7.7)
Neutrophils Relative %: 78 %
Platelets: 154 10*3/uL (ref 150–400)
RBC: 3.73 MIL/uL — ABNORMAL LOW (ref 4.22–5.81)
RDW: 13.7 % (ref 11.5–15.5)
WBC: 9.4 10*3/uL (ref 4.0–10.5)
nRBC: 0 % (ref 0.0–0.2)

## 2021-02-20 LAB — BASIC METABOLIC PANEL
Anion gap: 10 (ref 5–15)
BUN: 12 mg/dL (ref 8–23)
CO2: 23 mmol/L (ref 22–32)
Calcium: 8.2 mg/dL — ABNORMAL LOW (ref 8.9–10.3)
Chloride: 100 mmol/L (ref 98–111)
Creatinine, Ser: 1.31 mg/dL — ABNORMAL HIGH (ref 0.61–1.24)
GFR, Estimated: 58 mL/min — ABNORMAL LOW (ref >60)
Glucose, Bld: 223 mg/dL — ABNORMAL HIGH (ref 70–99)
Potassium: 3.6 mmol/L (ref 3.5–5.1)
Sodium: 133 mmol/L — ABNORMAL LOW (ref 135–145)

## 2021-02-20 LAB — URIC ACID: Uric Acid, Serum: 7 mg/dL (ref 3.7–8.6)

## 2021-02-20 NOTE — Discharge Instructions (Addendum)
Your lab results are unremarkable except for elevated glucose.  This is not a diagnosis of diabetes but need further evaluation.  He ultrasound was negative for DVT.  Read and follow discharge care instruction continue previous medication.  Return med ED if condition worsens.  Wear Jones wrap for 2 days.

## 2021-02-20 NOTE — ED Notes (Signed)
See triage note  Presents with swelling and pain to right ankle   States he has a hx of gout and thinks this is the same pain  Denies any injury   also having some swelling to right knee s/p knee replacement 4 weeks ago  Incision area intact  Healing well

## 2021-02-20 NOTE — ED Triage Notes (Signed)
Pt with Right knee replacment January 18, 2021. Pt states that right foot started to be swollen and painful yesterday. Pt with swollen right foot, warm to the touch. Knee incision intact, no drainage noted.

## 2021-02-20 NOTE — ED Provider Notes (Signed)
[facility] Emergency Department Provider Note   ____________________________________________   Event Date/Time   First MD Initiated Contact with Patient 02/20/21 [phone]     (approximate)  I have reviewed the triage vital signs and the nursing notes.   HISTORY  Chief Complaint Leg Swelling    HPI Dale Cook. is a 75 y.o. male patient presents with mild edema to the right foot.  Patient status post knee replacement January 18, 2021.  Patient pain and edema starting yesterday.  Patient pain increased with weightbearing.  Patient denies any fever states it feels warm to the touch.  Patient was concerned for gout secondary to eaten salmon yesterday.  Patient rates pain as a 7/10.  Patient describes the pain as "achy".  No palliative measure for complaint.         Past Medical History:  Diagnosis Date   Arthritis    "hands, knees" (01/09/2017)   CAD (coronary artery disease)    a. 12/2016 Cath: LM nl, LAD 95p, 42m, 60/90d, LCX 80p/m, 163m, OM1 15m, OM2 90, RCA 99p, Ef 55-65%; b. 12/2016 CABG x 4 (LIMA->mLAD, VG->dLAD, VG->OM1->OM2); c. 11/2018 MV: EF 66%, no ischemia.   Cardiac murmur    Grade II/VI early peaking systolic in aortic area   CHF (congestive heart failure) (HCC)    Essential hypertension    GERD (gastroesophageal reflux disease)    High cholesterol    History of echocardiogram    a. 12/2016 Echo: EF 65-70%.   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   Coronary artery disease involving native coronary artery with angina pectoris (HCC) 01/09/2017   Unstable angina (HCC)    Essential hypertension    Encounter for screening colonoscopy 09/21/2016    Past  Surgical History:  Procedure Laterality Date   CATARACT EXTRACTION W/PHACO Left 06/12/2020   Procedure: CATARACT EXTRACTION PHACO AND INTRAOCULAR LENS PLACEMENT (IOC) LEFT 4.36 00:51.9 8.4%;  Surgeon: Lockie Mola, MD;  Location: [facility];  Service: Ophthalmology;  Laterality: Left;   COLONOSCOPY WITH PROPOFOL N/A 11/09/2016   Procedure: COLONOSCOPY WITH PROPOFOL;  Surgeon: Earline Mayotte, MD;  Location: [facility];  Service: Endoscopy;  Laterality: N/A;   COLONOSCOPY WITH PROPOFOL N/A 11/29/2019   Procedure: COLONOSCOPY WITH PROPOFOL;  Surgeon: Earline Mayotte, MD;  Location: [facility];  Service: Endoscopy;  Laterality: N/A;   CORONARY ARTERY BYPASS GRAFT N/A 01/12/2017   CABG x 4: LIMA to mLAD, SVG to dLAD, sequential SVG to OM1-OM2, EVH via left thigh and leg;  Surgeon: Purcell Nails, MD;  Location: [facility] OR;  Service: Open Heart Surgery;  Laterality: N/A;   INTRAOPERATIVE TRANSESOPHAGEAL ECHOCARDIOGRAM N/A 01/12/2017   Procedure: INTRAOPERATIVE TRANSESOPHAGEAL ECHOCARDIOGRAM;  Surgeon: Purcell Nails, MD;  Location: [facility];  Service: Open Heart Surgery;  Laterality: N/A;   LEFT HEART CATH AND CORONARY ANGIOGRAPHY Left 01/09/2017   Procedure: Left Heart Cath and Coronary Angiography;  Surgeon: Iran Ouch, MD;  Location: [facility];  Service: Cardiovascular;  Laterality: Left;   TOTAL KNEE ARTHROPLASTY Right 01/18/2021   Procedure: TOTAL KNEE ARTHROPLASTY;  Surgeon: Lyndle Herrlich, MD;  Location: [facility] ORS;  Service:  Orthopedics;  Laterality: Right;    Prior to Admission medications   Medication Sig Start Date End Date Taking? Authorizing Provider  amLODipine (NORVASC) 10 MG tablet Take 1 tablet (10 mg total) by mouth daily. 11/17/20   Creig Hines, NP  aspirin 81 MG chewable tablet Chew 1 tablet (81 mg total) by mouth 2 (two) times daily. 01/20/21   [name], PA-C  atorvastatin (LIPITOR) 40 MG tablet Take 40 mg by mouth  daily.    [provider]  carvedilol (COREG) 25 MG tablet Take 1 tablet (25 mg total) by mouth 2 (two) times daily. 07/09/20   Creig Hines, NP  dextromethorphan-guaiFENesin [facility] DM) 30-600 MG 12hr tablet Take 1 tablet by mouth 2 (two) times daily as needed.    [provider]  docusate sodium (COLACE) 100 MG capsule Take 1 capsule (100 mg total) by mouth 2 (two) times daily. 01/20/21   [name], PA-C  furosemide (LASIX) 20 MG tablet Take 1 tablet (20 mg total) by mouth daily. 12/23/20   Delma Freeze, FNP  isosorbide mononitrate (IMDUR) 60 MG 24 hr tablet Take 1 tablet (60 mg total) by mouth daily. 11/05/20 10/31/21  Iran Ouch, MD  loratadine (CLARITIN) 10 MG tablet Take 10 mg by mouth daily as needed for allergies.    [provider]  meloxicam (MOBIC) 15 MG tablet Take 15 mg by mouth daily as needed for pain. 10/05/18   [provider]  methocarbamol (ROBAXIN) 500 MG tablet Take 1 tablet (500 mg total) by mouth 3 (three) times daily. 01/20/21   [name], PA-C  moexipril (UNIVASC) 15 MG tablet Take 15 mg by mouth at bedtime.  10/24/18   [provider]  oxyCODONE-acetaminophen (PERCOCET/ROXICET) 5-325 MG tablet Take 1 tablet by mouth every 4 (four) hours as needed for moderate pain. 01/20/21   [name], PA-C  pantoprazole (PROTONIX) 40 MG tablet TAKE 1 TABLET EVERY DAY 01/12/21   Iran Ouch, MD    Allergies Patient has no known allergies.  Family History  Problem Relation Age of Onset   Heart attack Mother    Colon cancer Brother     Social History Social History   Tobacco Use   Smoking status: Never   Smokeless tobacco: Never  Vaping Use   Vaping Use: Never used  Substance Use Topics   Alcohol use: No   Drug use: No    Review of Systems Constitutional: No fever/chills Eyes: No visual changes. ENT: No sore throat. Cardiovascular: Denies chest pain. Respiratory: Denies shortness of  breath. Gastrointestinal: No abdominal pain.  No nausea, no vomiting.  No diarrhea.  No constipation. Genitourinary: Negative for dysuria. Musculoskeletal: Negative for back pain. Skin: Negative for rash.  Edema and erythema right lower extremity. Neurological: Negative for headaches, focal weakness or numbness. Endocrine: Hyperlipidemia and hypertension  ____________________________________________   PHYSICAL EXAM:  VITAL SIGNS: ED Triage Vitals [02/20/21 0810]  Enc Vitals Group     BP 133/67     Pulse Rate 79     Resp 18     Temp 98.7 F (37.1 C)     Temp Source Oral     SpO2 99 %     Weight 160 lb (72.6 kg)     Height 6' (1.829 m)     Head Circumference      Peak Flow      Pain Score 7     Pain Loc      Pain Edu?  Excl. in GC?     Constitutional: Alert and oriented. Well appearing and in no acute distress. Cardiovascular: Normal rate, regular rhythm. Grossly normal heart sounds.  Good peripheral circulation. Respiratory: Normal respiratory effort.  No retractions. Lungs CTAB. Gastrointestinal: Soft and nontender. No distention. No abdominal bruits. No CVA tenderness. Genitourinary: Deferred Musculoskeletal: No obvious deformity to the right lower extremity.  Patient has mild guarding with palpation around the ankle..  No joint effusions. Neurologic:  Normal speech and language. No gross focal neurologic deficits are appreciated. No gait instability. Skin: Healing surgical scar right patellar area.  No rash noted.  Mild edema and erythema to the right ankle/foot. Psychiatric: Mood and affect are normal. Speech and behavior are normal.  ____________________________________________   LABS (all labs ordered are listed, but only abnormal results are displayed)  Labs Reviewed  CBC WITH DIFFERENTIAL/PLATELET - Abnormal; Notable for the following components:      Result Value   RBC 3.73 (*)    Hemoglobin 10.8 (*)    HCT 32.1 (*)    Monocytes Absolute 1.1 (*)     All other components within normal limits  BASIC METABOLIC PANEL - Abnormal; Notable for the following components:   Sodium 133 (*)    Glucose, Bld 223 (*)    Creatinine, Ser 1.31 (*)    Calcium 8.2 (*)    GFR, Estimated 58 (*)    All other components within normal limits  URIC ACID   ____________________________________________  EKG   ____________________________________________  RADIOLOGY I, Joni Reiningonald K Saban Heinlen, personally viewed and evaluated these images (plain radiographs) as part of my medical decision making, as well as reviewing the written report by the radiologist.  ED MD interpretation:    Official radiology report(s): US Venous Img Lower Unilateral Right  Result Date: 02/20/2021 CLINICAL DATA:  Pain and swelling post knee replacement surgery EXAM: RIGHT  LOWER EXTREMITY VENOUS DOPPLER ULTRASOUND TECHNIQUE: Gray-scale sonography with compression, as well as color and duplex ultrasound, were performed to evaluate the deep venous system(s) from the level of the common femoral vein through the popliteal and proximal calf veins. COMPARISON:  None. FINDINGS: VENOUS Normal compressibility of the common femoral, superficial femoral, and popliteal veins, as well as the visualized calf veins. Visualized portions of profunda femoral vein and great saphenous vein unremarkable. No filling defects to suggest DVT on grayscale or color Doppler imaging. Doppler waveforms show normal direction of venous flow, normal respiratory phasicity and response to augmentation. Limited views of the contralateral common femoral vein are unremarkable. OTHER None. Limitations: none IMPRESSION: No femoropopliteal DVT nor evidence of DVT within the visualized calf veins. If clinical symptoms are inconsistent or if there are persistent or worsening symptoms, further imaging (possibly involving the iliac veins) may be warranted. Electronically Signed   By: Corlis Leak  Hassell M.D.   On: 02/20/2021 10:10     ____________________________________________   PROCEDURES  Procedure(s) performed (including Critical Care):  Procedures   ____________________________________________   INITIAL IMPRESSION / ASSESSMENT AND PLAN / ED COURSE  As part of my medical decision making, I reviewed the following data within the electronic MEDICAL RECORD NUMBER         Patient presents with right ankle swelling.  Patient is status post 1 month right knee replacement.  Patient voices concern for gout.  Discussed lab results which were unremarkable except for elevated glucose.  Patient also had ultrasound which was negative for DVT.  Patient complaining physical exam is consistent with peripheral edema.  Patient placed  in a Jones wrap and advised to follow-up with PCP in 2 days.     ____________________________________________   FINAL CLINICAL IMPRESSION(S) / ED DIAGNOSES  Final diagnoses:  Peripheral edema  Elevated serum glucose     ED Discharge Orders     None        Note:  This document was prepared using Dragon voice recognition software and may include unintentional dictation errors.    Joni Reining, PA-C 02/20/21 1042    Phineas Semen, MD 02/20/21 [phone]

## 2021-02-24 ENCOUNTER — Other Ambulatory Visit: Payer: Self-pay | Admitting: Nurse Practitioner

## 2021-02-24 NOTE — Telephone Encounter (Signed)
This is a [place] pt 

## 2021-04-07 ENCOUNTER — Other Ambulatory Visit: Payer: Self-pay | Admitting: Cardiovascular Disease

## 2021-04-19 ENCOUNTER — Other Ambulatory Visit: Payer: Self-pay | Admitting: Nurse Practitioner

## 2021-04-27 NOTE — Progress Notes (Signed)
Cardiology Office Note    Date:  04/28/2021   ID:  Dale Grammestis Wilbert Joneen BoersRankin Jr., DOB 11/27/1945, MRN [id number]  PCP:  Jaclyn Shaggyate, Denny C, MD  Cardiologist:  Lorine BearsMuhammad Arida, MD  Electrophysiologist:  None   Chief Complaint: Follow-up  History of Present Illness:   Dale DollarOtis Wilbert Afshar Jr. is a 75 y.o. male with history of CAD status post CABG in 12/2016, HFpEF, HTN, and HLD who presents for follow-up of his CAD and HFpEF.  He presented in 2018 with unstable angina with LHC showing severe three-vessel CAD.  He subsequently underwent four-vessel CABG in 12/2016 with LIMA to mid LAD, SVG to distal LAD, jump graft with SVG to OM1 and OM 2.  Carotid artery ultrasound prior to surgery showed no significant disease.  Echo showed normal LV systolic function with no significant valvular abnormalities.  He was admitted in 11/2018 with chest pain in the setting of hypertensive urgency and ruled out for MI.  Lexiscan MPI was negative for significant ischemia.  He was seen in the ED in 08/2020 with a several month history of cough with a mildly elevated BNP at 211.  COVID was negative.  He was treated with a short course of Lasix.  Echo in 09/2020 showed normal LV systolic function, grade 1 diastolic dysfunction, normal pulmonary pressure, mild mitral regurgitation, and mild aortic insufficiency.  He was last seen in our office in 10/2020 with notation that furosemide 20 mg daily had been resumed by the CHF clinic given dyspnea and lower extremity swelling.  At his visit in our office, he reported feeling much better while on furosemide.  He was felt to be euvolemic.  He underwent right TKA in 12/2020.  [facility]20/2021 with lower extremity swelling involving the right foot with increased pain with weightbearing.  Right lower extremity ultrasound was negative for DVT.  He was discharged to outpatient follow-up with PCP.  He comes in doing very well from a cardiac perspective.  No chest pain,  dyspnea, palpitations, dizziness, presyncope, syncope, lower extremity swelling, abdominal distention, orthopnea, PND, or early satiety.  His weight is down 11 pounds today when compared to his last clinic visit in 10/2020.  He is watching his salt and fluid intake.  He remains on furosemide 20 mg daily.  No falls, hematochezia, or melena.  He is tolerating all cardiac medications without issues.  Blood pressure remains well controlled.  Since undergoing knee replacement surgery back in the summer 2022, his functional status has significantly improved and he is walking on a daily basis without cardiac limitation.  He is currently fasting.  He does not have any active cardiac issues or concerns at this time.   Labs independently reviewed: 01/2021 - potassium 3.6, BUN 12, serum creatinine 1.31, Hgb 10.8, PLT 154 08/2020 - albumin 3.4, AST/ALT normal 03/2019 - TC 106, TG 63, HDL 37, LDL 55 11/2018 - TSH normal   Past Medical History:  Diagnosis Date   Arthritis    "hands, knees" (01/09/2017)   CAD (coronary artery disease)    a. 12/2016 Cath: LM nl, LAD 95p, 6571m, 60/90d, LCX 80p/m, 16666m, OM1 6871m, OM2 90, RCA 99p, Ef 55-65%; b. 12/2016 CABG x 4 (LIMA->mLAD, VG->dLAD, VG->OM1->OM2); c. 11/2018 MV: EF 66%, no ischemia.   Cardiac murmur    Grade II/VI early peaking systolic in aortic area   CHF (congestive heart failure) (HCC)    Essential hypertension    GERD (gastroesophageal reflux disease)  High cholesterol    History of echocardiogram    a. 12/2016 Echo: EF 65-70%.   Left carotid bruit    Wears dentures    partial upper    Past Surgical History:  Procedure Laterality Date   CATARACT EXTRACTION W/PHACO Left 06/12/2020   Procedure: CATARACT EXTRACTION PHACO AND INTRAOCULAR LENS PLACEMENT (IOC) LEFT 4.36 00:51.9 8.4%;  Surgeon: Leandrew Koyanagi, MD;  Location: [place];  Service: Ophthalmology;  Laterality: Left;   COLONOSCOPY WITH PROPOFOL N/A 11/09/2016   Procedure: COLONOSCOPY  WITH PROPOFOL;  Surgeon: Robert Bellow, MD;  Location: [facility];  Service: Endoscopy;  Laterality: N/A;   COLONOSCOPY WITH PROPOFOL N/A 11/29/2019   Procedure: COLONOSCOPY WITH PROPOFOL;  Surgeon: Robert Bellow, MD;  Location: [facility];  Service: Endoscopy;  Laterality: N/A;   CORONARY ARTERY BYPASS GRAFT N/A 01/12/2017   CABG x 4: LIMA to mLAD, SVG to dLAD, sequential SVG to OM1-OM2, EVH via left thigh and leg;  Surgeon: Rexene Alberts, MD;  Location: [place];  Service: Open Heart Surgery;  Laterality: N/A;   INTRAOPERATIVE TRANSESOPHAGEAL ECHOCARDIOGRAM N/A 01/12/2017   Procedure: INTRAOPERATIVE TRANSESOPHAGEAL ECHOCARDIOGRAM;  Surgeon: Rexene Alberts, MD;  Location: [place];  Service: Open Heart Surgery;  Laterality: N/A;   LEFT HEART CATH AND CORONARY ANGIOGRAPHY Left 01/09/2017   Procedure: Left Heart Cath and Coronary Angiography;  Surgeon: Wellington Hampshire, MD;  Location: [place] [facility];  Service: Cardiovascular;  Laterality: Left;   TOTAL KNEE ARTHROPLASTY Right 01/18/2021   Procedure: TOTAL KNEE ARTHROPLASTY;  Surgeon: Lovell Sheehan, MD;  Location: [facility];  Service: Orthopedics;  Laterality: Right;    Current Medications: Current Meds  Medication Sig   amLODipine (NORVASC) 10 MG tablet TAKE 1 TABLET EVERY DAY   aspirin 81 MG chewable tablet Chew 1 tablet (81 mg total) by mouth 2 (two) times daily. (Patient taking differently: Chew 81 mg by mouth daily.)   atorvastatin (LIPITOR) 40 MG tablet Take 40 mg by mouth daily.   carvedilol (COREG) 25 MG tablet TAKE 1 TABLET TWICE DAILY   dextromethorphan-guaiFENesin (MUCINEX DM) 30-600 MG 12hr tablet Take 1 tablet by mouth 2 (two) times daily as needed.   furosemide (LASIX) 20 MG tablet Take 1 tablet (20 mg total) by mouth daily.   isosorbide mononitrate (IMDUR) 60 MG 24 hr tablet Take 1 tablet (60 mg total) by mouth daily. PLEASE SCHEDULE OFFICE VISIT FOR REFILLS. THANK YOU!   loratadine (CLARITIN) 10 MG tablet  Take 10 mg by mouth daily as needed for allergies.   meloxicam (MOBIC) 15 MG tablet Take 15 mg by mouth daily as needed for pain.   moexipril (UNIVASC) 15 MG tablet Take 15 mg by mouth at bedtime.    pantoprazole (PROTONIX) 40 MG tablet TAKE 1 TABLET EVERY DAY    Allergies:   Patient has no known allergies.   Social History   Socioeconomic History   Marital status: Divorced    Spouse name: Not on file   Number of children: 3   Years of education: Not on file   Highest education level: Not on file  Occupational History   Not on file  Tobacco Use   Smoking status: Never   Smokeless tobacco: Never  Vaping Use   Vaping Use: Never used  Substance and Sexual Activity   Alcohol use: No   Drug use: No   Sexual activity: Not Currently  Other Topics Concern   Not on file  Social History Narrative  Not on file   Social Determinants of Health   Financial Resource Strain: Not on file  Food Insecurity: Not on file  Transportation Needs: Not on file  Physical Activity: Sufficiently Active   Days of Exercise per Week: 7 days   Minutes of Exercise per Session: 120 min  Stress: Not on file  Social Connections: Not on file     Family History:  The patient's family history includes Colon cancer in his brother; Heart attack in his mother.  ROS:   Review of Systems  Constitutional:  Negative for chills, diaphoresis, fever, malaise/fatigue and weight loss.  HENT:  Negative for congestion.   Eyes:  Negative for discharge and redness.  Respiratory:  Negative for cough, sputum production, shortness of breath and wheezing.   Cardiovascular:  Negative for chest pain, palpitations, orthopnea, claudication, leg swelling and PND.  Gastrointestinal:  Negative for abdominal pain, blood in stool, heartburn, melena, nausea and vomiting.  Musculoskeletal:  Negative for falls and myalgias.  Skin:  Negative for rash.  Neurological:  Negative for dizziness, tingling, tremors, sensory change,  speech change, focal weakness, loss of consciousness and weakness.  Endo/Heme/Allergies:  Does not bruise/bleed easily.  Psychiatric/Behavioral:  Negative for substance abuse. The patient is not nervous/anxious.   All other systems reviewed and are negative.   EKGs/Labs/Other Studies Reviewed:    Studies reviewed were summarized above. The additional studies were reviewed today:  2D echo 09/2020: 1. Left ventricular ejection fraction, by estimation, is 60 to 65%. The  left ventricle has normal function. The left ventricle has no regional  wall motion abnormalities. Left ventricular diastolic parameters are  consistent with Grade I diastolic  dysfunction (impaired relaxation).   2. Right ventricular systolic function is normal. The right ventricular  size is normal. There is normal pulmonary artery systolic pressure. The  estimated right ventricular systolic pressure is 0000000 mmHg.   3. Left atrial size was mildly dilated.   4. The mitral valve is normal in structure. Mild mitral valve  regurgitation.   5. The aortic valve is normal in structure. Aortic valve regurgitation is  mild.  __________  Carlton Adam MPI 11/2018: Pharmacological myocardial perfusion imaging study with no significant  ischemia Normal wall motion, EF estimated at 66% No EKG changes concerning for ischemia at peak stress or in recovery. Nonspecific T wave ABN on resting EKG V3 to V6 Low risk scan    EKG:  EKG is ordered today.  The EKG ordered today demonstrates NSR, 68 bpm, possible prior anterior infarct, no acute ST-T changes  Recent Labs: 09/19/2020: ALT 15; B Natriuretic Peptide 211.7 02/20/2021: BUN 12; Creatinine, Ser 1.31; Hemoglobin 10.8; Platelets 154; Potassium 3.6; Sodium 133  Recent Lipid Panel    Component Value Date/Time   CHOL 112 03/17/2017 0904   TRIG 70 03/17/2017 0904   HDL 31 (L) 03/17/2017 0904   CHOLHDL 3.6 03/17/2017 0904   VLDL 14 03/17/2017 0904   LDLCALC 67 03/17/2017 0904     PHYSICAL EXAM:    VS:  BP 128/70 (BP Location: Left Arm, Patient Position: Sitting, Cuff Size: Normal)   Pulse 68   Ht 6' (1.829 m)   Wt 220 lb 4 oz (99.9 kg)   SpO2 98%   BMI 29.87 kg/m   BMI: Body mass index is 29.87 kg/m.  Physical Exam Vitals reviewed.  Constitutional:      Appearance: He is well-developed.  HENT:     Head: Normocephalic and atraumatic.  Eyes:  General:        Right eye: No discharge.        Left eye: No discharge.  Neck:     Vascular: No JVD.  Cardiovascular:     Rate and Rhythm: Normal rate and regular rhythm.     Pulses:          Posterior tibial pulses are 2+ on the right side and 2+ on the left side.     Heart sounds: Normal heart sounds, S1 normal and S2 normal. Heart sounds not distant. No midsystolic click and no opening snap. No murmur heard.   No friction rub.  Pulmonary:     Effort: Pulmonary effort is normal. No respiratory distress.     Breath sounds: Normal breath sounds. No decreased breath sounds, wheezing or rales.  Chest:     Chest wall: No tenderness.  Abdominal:     General: There is no distension.     Palpations: Abdomen is soft.     Tenderness: There is no abdominal tenderness.  Musculoskeletal:     Cervical back: Normal range of motion.     Right lower leg: No edema.     Left lower leg: No edema.  Skin:    General: Skin is warm and dry.     Nails: There is no clubbing.  Neurological:     Mental Status: He is alert and oriented to person, place, and time.  Psychiatric:        Speech: Speech normal.        Behavior: Behavior normal.        Thought Content: Thought content normal.        Judgment: Judgment normal.    Wt Readings from Last 3 Encounters:  04/28/21 220 lb 4 oz (99.9 kg)  02/20/21 160 lb (72.6 kg)  01/18/21 233 lb 0.4 oz (105.7 kg)     ASSESSMENT & PLAN:   CAD status post CABG without angina: He is doing very well from a cardiac perspective without any symptoms concerning for angina.  Continue  secondary prevention and risk factor modification including aspirin, amlodipine, atorvastatin, carvedilol, and isosorbide mononitrate.  No indication for ischemic testing at this time.  HFpEF: He appears euvolemic and well compensated.  His weight is down 11 pounds today when compared to his last clinic visit in 10/2020 with a trend of 231 to 220 pounds.  He remains on furosemide 20 mg daily.  We will check a CMP today to ensure stable renal function, particularly with noted weight loss.  HTN: Blood pressure is well controlled.  Continue current medications as outlined above.  HLD: Last LDL on file of 55.  Check lipid panel.  He is fasting today.  Disposition: F/u with Dr. Kirke Corin or an APP in 6 months.   Medication Adjustments/Labs and Tests Ordered: Current medicines are reviewed at length with the patient today.  Concerns regarding medicines are outlined above. Medication changes, Labs and Tests ordered today are summarized above and listed in the Patient Instructions accessible in Encounters.   Signed, Eula Listen, PA-C 04/28/2021 8:35 AM     [facility] [place] [address] [place], [place] [address] [phone]

## 2021-04-28 ENCOUNTER — Other Ambulatory Visit: Payer: Self-pay

## 2021-04-28 ENCOUNTER — Ambulatory Visit: Payer: Medicare HMO | Admitting: Physician Assistant

## 2021-04-28 ENCOUNTER — Encounter: Payer: Self-pay | Admitting: Physician Assistant

## 2021-04-28 VITALS — BP 128/70 | HR 68 | Ht 72.0 in | Wt 220.2 lb

## 2021-04-28 DIAGNOSIS — E785 Hyperlipidemia, unspecified: Secondary | ICD-10-CM

## 2021-04-28 DIAGNOSIS — I2510 Atherosclerotic heart disease of native coronary artery without angina pectoris: Secondary | ICD-10-CM

## 2021-04-28 DIAGNOSIS — I5032 Chronic diastolic (congestive) heart failure: Secondary | ICD-10-CM

## 2021-04-28 DIAGNOSIS — I10 Essential (primary) hypertension: Secondary | ICD-10-CM | POA: Diagnosis not present

## 2021-04-28 NOTE — Patient Instructions (Signed)
Medication Instructions:  No changes at this time.  *If you need a refill on your cardiac medications before your next appointment, please call your pharmacy*   Lab Work: CMET & Lipid panel  If you have labs (blood work) drawn today and your tests are completely normal, you will receive your results only by: MyChart Message (if you have MyChart) OR A paper copy in the mail If you have any lab test that is abnormal or we need to change your treatment, we will call you to review the results.   Testing/Procedures: None   Follow-Up: At [facility], you and your health needs are our priority.  As part of our continuing mission to provide you with exceptional heart care, we have created designated Provider Care Teams.  These Care Teams include your primary Cardiologist (physician) and Advanced Practice Providers (APPs -  Physician Assistants and Nurse Practitioners) who all work together to provide you with the care you need, when you need it.  We recommend signing up for the patient portal called "MyChart".  Sign up information is provided on this After Visit Summary.  MyChart is used to connect with patients for Virtual Visits (Telemedicine).  Patients are able to view lab/test results, encounter notes, upcoming appointments, etc.  Non-urgent messages can be sent to your provider as well.   To learn more about what you can do with MyChart, go to [email or web address].    Your next appointment:   6 month(s)  The format for your next appointment:   In Person  Provider:   You may see [name], MD or one of the following Advanced Practice Providers on your designated Care Team:   Nicolasa Ducking, NP Eula Listen, PA-C Marisue Ivan, PA-C Cadence Catharine, [place]

## 2021-04-29 LAB — LIPID PANEL
Chol/HDL Ratio: 3.2 ratio (ref 0.0–5.0)
Cholesterol, Total: 111 mg/dL (ref 100–199)
HDL: 35 mg/dL — ABNORMAL LOW (ref >39)
LDL Chol Calc (NIH): 60 mg/dL (ref 0–99)
Triglycerides: 77 mg/dL (ref 0–149)
VLDL Cholesterol Cal: 16 mg/dL (ref 5–40)

## 2021-04-29 LAB — COMPREHENSIVE METABOLIC PANEL
ALT: 11 IU/L (ref 0–44)
AST: 10 IU/L (ref 0–40)
Albumin/Globulin Ratio: 1.1 — ABNORMAL LOW (ref 1.2–2.2)
Albumin: 4 g/dL (ref 3.7–4.7)
Alkaline Phosphatase: 149 IU/L — ABNORMAL HIGH (ref 44–121)
BUN/Creatinine Ratio: 11 (ref 10–24)
BUN: 13 mg/dL (ref 8–27)
Bilirubin Total: 0.3 mg/dL (ref 0.0–1.2)
CO2: 25 mmol/L (ref 20–29)
Calcium: 9.2 mg/dL (ref 8.6–10.2)
Chloride: 101 mmol/L (ref 96–106)
Creatinine, Ser: 1.15 mg/dL (ref 0.76–1.27)
Globulin, Total: 3.5 g/dL (ref 1.5–4.5)
Glucose: 115 mg/dL — ABNORMAL HIGH (ref 70–99)
Potassium: 4.5 mmol/L (ref 3.5–5.2)
Sodium: 140 mmol/L (ref 134–144)
Total Protein: 7.5 g/dL (ref 6.0–8.5)
eGFR: 67 mL/min/{1.73_m2} (ref >59)

## 2021-05-02 ENCOUNTER — Other Ambulatory Visit: Payer: Self-pay

## 2021-05-02 ENCOUNTER — Encounter: Payer: Self-pay | Admitting: Emergency Medicine

## 2021-05-02 ENCOUNTER — Emergency Department: Payer: Medicare Other

## 2021-05-02 DIAGNOSIS — J209 Acute bronchitis, unspecified: Secondary | ICD-10-CM | POA: Diagnosis present

## 2021-05-02 DIAGNOSIS — A419 Sepsis, unspecified organism: Principal | ICD-10-CM | POA: Diagnosis present

## 2021-05-02 DIAGNOSIS — R7303 Prediabetes: Secondary | ICD-10-CM | POA: Diagnosis not present

## 2021-05-02 DIAGNOSIS — K219 Gastro-esophageal reflux disease without esophagitis: Secondary | ICD-10-CM | POA: Diagnosis not present

## 2021-05-02 DIAGNOSIS — Z8249 Family history of ischemic heart disease and other diseases of the circulatory system: Secondary | ICD-10-CM

## 2021-05-02 DIAGNOSIS — Z20822 Contact with and (suspected) exposure to covid-19: Secondary | ICD-10-CM | POA: Diagnosis present

## 2021-05-02 DIAGNOSIS — I2510 Atherosclerotic heart disease of native coronary artery without angina pectoris: Secondary | ICD-10-CM | POA: Diagnosis present

## 2021-05-02 DIAGNOSIS — D631 Anemia in chronic kidney disease: Secondary | ICD-10-CM | POA: Diagnosis not present

## 2021-05-02 DIAGNOSIS — I5032 Chronic diastolic (congestive) heart failure: Secondary | ICD-10-CM | POA: Diagnosis not present

## 2021-05-02 DIAGNOSIS — Z7982 Long term (current) use of aspirin: Secondary | ICD-10-CM | POA: Diagnosis not present

## 2021-05-02 DIAGNOSIS — R6520 Severe sepsis without septic shock: Secondary | ICD-10-CM | POA: Diagnosis not present

## 2021-05-02 DIAGNOSIS — Z800 Family history of malignant neoplasm of digestive organs: Secondary | ICD-10-CM | POA: Diagnosis not present

## 2021-05-02 DIAGNOSIS — R739 Hyperglycemia, unspecified: Secondary | ICD-10-CM | POA: Diagnosis present

## 2021-05-02 DIAGNOSIS — E860 Dehydration: Secondary | ICD-10-CM | POA: Diagnosis present

## 2021-05-02 DIAGNOSIS — N179 Acute kidney failure, unspecified: Secondary | ICD-10-CM | POA: Diagnosis present

## 2021-05-02 DIAGNOSIS — Z951 Presence of aortocoronary bypass graft: Secondary | ICD-10-CM | POA: Diagnosis not present

## 2021-05-02 DIAGNOSIS — Z79899 Other long term (current) drug therapy: Secondary | ICD-10-CM

## 2021-05-02 DIAGNOSIS — E7800 Pure hypercholesterolemia, unspecified: Secondary | ICD-10-CM | POA: Diagnosis present

## 2021-05-02 DIAGNOSIS — I130 Hypertensive heart and chronic kidney disease with heart failure and stage 1 through stage 4 chronic kidney disease, or unspecified chronic kidney disease: Secondary | ICD-10-CM | POA: Diagnosis present

## 2021-05-02 DIAGNOSIS — M170 Bilateral primary osteoarthritis of knee: Secondary | ICD-10-CM | POA: Diagnosis not present

## 2021-05-02 DIAGNOSIS — I248 Other forms of acute ischemic heart disease: Secondary | ICD-10-CM | POA: Diagnosis not present

## 2021-05-02 DIAGNOSIS — R059 Cough, unspecified: Secondary | ICD-10-CM | POA: Diagnosis present

## 2021-05-02 DIAGNOSIS — N1831 Chronic kidney disease, stage 3a: Secondary | ICD-10-CM | POA: Diagnosis not present

## 2021-05-02 DIAGNOSIS — J439 Emphysema, unspecified: Secondary | ICD-10-CM | POA: Diagnosis present

## 2021-05-02 DIAGNOSIS — Z791 Long term (current) use of non-steroidal anti-inflammatories (NSAID): Secondary | ICD-10-CM

## 2021-05-02 LAB — TROPONIN I (HIGH SENSITIVITY)
Troponin I (High Sensitivity): 25 ng/L — ABNORMAL HIGH (ref <18)
Troponin I (High Sensitivity): 28 ng/L — ABNORMAL HIGH (ref <18)

## 2021-05-02 LAB — COMPREHENSIVE METABOLIC PANEL
ALT: 14 U/L (ref 0–44)
AST: 22 U/L (ref 15–41)
Albumin: 3.1 g/dL — ABNORMAL LOW (ref 3.5–5.0)
Alkaline Phosphatase: 93 U/L (ref 38–126)
Anion gap: 9 (ref 5–15)
BUN: 21 mg/dL (ref 8–23)
CO2: 24 mmol/L (ref 22–32)
Calcium: 8.2 mg/dL — ABNORMAL LOW (ref 8.9–10.3)
Chloride: 98 mmol/L (ref 98–111)
Creatinine, Ser: 2.03 mg/dL — ABNORMAL HIGH (ref 0.61–1.24)
GFR, Estimated: 34 mL/min — ABNORMAL LOW (ref >60)
Glucose, Bld: 285 mg/dL — ABNORMAL HIGH (ref 70–99)
Potassium: 3.7 mmol/L (ref 3.5–5.1)
Sodium: 131 mmol/L — ABNORMAL LOW (ref 135–145)
Total Bilirubin: 0.9 mg/dL (ref 0.3–1.2)
Total Protein: 7.3 g/dL (ref 6.5–8.1)

## 2021-05-02 LAB — CBC
HCT: 35.1 % — ABNORMAL LOW (ref 39.0–52.0)
Hemoglobin: 11.2 g/dL — ABNORMAL LOW (ref 13.0–17.0)
MCH: 26.7 pg (ref 26.0–34.0)
MCHC: 31.9 g/dL (ref 30.0–36.0)
MCV: 83.8 fL (ref 80.0–100.0)
Platelets: 201 10*3/uL (ref 150–400)
RBC: 4.19 MIL/uL — ABNORMAL LOW (ref 4.22–5.81)
RDW: 15.4 % (ref 11.5–15.5)
WBC: 19.2 10*3/uL — ABNORMAL HIGH (ref 4.0–10.5)
nRBC: 0 % (ref 0.0–0.2)

## 2021-05-02 LAB — BRAIN NATRIURETIC PEPTIDE: B Natriuretic Peptide: 218.6 pg/mL — ABNORMAL HIGH (ref 0.0–100.0)

## 2021-05-02 NOTE — ED Triage Notes (Signed)
Pt via POV from home. Pt c/o SOB, productive cough, and mid-sternal CP for about a month that has gotten worse. Pt hx triple bypass in 2018. Pt is A&OX4 and NAD.

## 2021-05-02 NOTE — ED Provider Notes (Signed)
Emergency Medicine Provider Triage Evaluation Note  Dale Cook. , a 75 y.o. male  was evaluated in triage.  Pt complains of intermittent chest pain for the past month with shortness of breath and cough. Cough is occasionally productive and worse at night.  Review of Systems  Positive: Chest pain, shortness of breath, cough Negative: Nausea, vomiting, diaphoresis, leg pain  Physical Exam  There were no vitals taken for this visit. Gen:   Awake, no distress   Resp:  Normal effort  MSK:   Moves extremities without difficulty  Other:    Medical Decision Making  Medically screening exam initiated at 5:44 PM.  Appropriate orders placed.  Karo Esaul Dorwart. was informed that the remainder of the evaluation will be completed by another provider, this initial triage assessment does not replace that evaluation, and the importance of remaining in the ED until their evaluation is complete.   Chinita Pester, FNP 05/02/21 1748    Willy Eddy, MD 05/02/21 1907

## 2021-05-03 ENCOUNTER — Emergency Department: Payer: Medicare Other

## 2021-05-03 ENCOUNTER — Inpatient Hospital Stay
Admission: EM | Admit: 2021-05-03 | Discharge: 2021-05-05 | DRG: 872 | Disposition: A | Discharge status: Home Health | Payer: Medicare Other | Attending: Student | Admitting: Student

## 2021-05-03 DIAGNOSIS — R7989 Other specified abnormal findings of blood chemistry: Secondary | ICD-10-CM | POA: Diagnosis present

## 2021-05-03 DIAGNOSIS — R7303 Prediabetes: Secondary | ICD-10-CM | POA: Diagnosis not present

## 2021-05-03 DIAGNOSIS — Z7982 Long term (current) use of aspirin: Secondary | ICD-10-CM | POA: Diagnosis not present

## 2021-05-03 DIAGNOSIS — Z79899 Other long term (current) drug therapy: Secondary | ICD-10-CM | POA: Diagnosis not present

## 2021-05-03 DIAGNOSIS — N1831 Chronic kidney disease, stage 3a: Secondary | ICD-10-CM | POA: Diagnosis not present

## 2021-05-03 DIAGNOSIS — E860 Dehydration: Secondary | ICD-10-CM | POA: Diagnosis not present

## 2021-05-03 DIAGNOSIS — R079 Chest pain, unspecified: Secondary | ICD-10-CM | POA: Diagnosis present

## 2021-05-03 DIAGNOSIS — D72829 Elevated white blood cell count, unspecified: Secondary | ICD-10-CM

## 2021-05-03 DIAGNOSIS — J439 Emphysema, unspecified: Secondary | ICD-10-CM | POA: Diagnosis not present

## 2021-05-03 DIAGNOSIS — R778 Other specified abnormalities of plasma proteins: Secondary | ICD-10-CM | POA: Diagnosis not present

## 2021-05-03 DIAGNOSIS — Z800 Family history of malignant neoplasm of digestive organs: Secondary | ICD-10-CM | POA: Diagnosis not present

## 2021-05-03 DIAGNOSIS — K219 Gastro-esophageal reflux disease without esophagitis: Secondary | ICD-10-CM | POA: Diagnosis not present

## 2021-05-03 DIAGNOSIS — N189 Chronic kidney disease, unspecified: Secondary | ICD-10-CM | POA: Diagnosis not present

## 2021-05-03 DIAGNOSIS — J209 Acute bronchitis, unspecified: Secondary | ICD-10-CM | POA: Diagnosis present

## 2021-05-03 DIAGNOSIS — R739 Hyperglycemia, unspecified: Secondary | ICD-10-CM | POA: Diagnosis not present

## 2021-05-03 DIAGNOSIS — N179 Acute kidney failure, unspecified: Secondary | ICD-10-CM

## 2021-05-03 DIAGNOSIS — I10 Essential (primary) hypertension: Secondary | ICD-10-CM | POA: Diagnosis not present

## 2021-05-03 DIAGNOSIS — I2510 Atherosclerotic heart disease of native coronary artery without angina pectoris: Secondary | ICD-10-CM | POA: Diagnosis present

## 2021-05-03 DIAGNOSIS — I130 Hypertensive heart and chronic kidney disease with heart failure and stage 1 through stage 4 chronic kidney disease, or unspecified chronic kidney disease: Secondary | ICD-10-CM | POA: Diagnosis not present

## 2021-05-03 DIAGNOSIS — Z8249 Family history of ischemic heart disease and other diseases of the circulatory system: Secondary | ICD-10-CM | POA: Diagnosis not present

## 2021-05-03 DIAGNOSIS — E785 Hyperlipidemia, unspecified: Secondary | ICD-10-CM | POA: Diagnosis present

## 2021-05-03 DIAGNOSIS — I5032 Chronic diastolic (congestive) heart failure: Secondary | ICD-10-CM | POA: Diagnosis not present

## 2021-05-03 DIAGNOSIS — A419 Sepsis, unspecified organism: Secondary | ICD-10-CM | POA: Diagnosis not present

## 2021-05-03 DIAGNOSIS — Z20822 Contact with and (suspected) exposure to covid-19: Secondary | ICD-10-CM | POA: Diagnosis not present

## 2021-05-03 DIAGNOSIS — R6520 Severe sepsis without septic shock: Secondary | ICD-10-CM | POA: Diagnosis present

## 2021-05-03 DIAGNOSIS — I248 Other forms of acute ischemic heart disease: Secondary | ICD-10-CM | POA: Diagnosis not present

## 2021-05-03 DIAGNOSIS — Z791 Long term (current) use of non-steroidal anti-inflammatories (NSAID): Secondary | ICD-10-CM | POA: Diagnosis not present

## 2021-05-03 DIAGNOSIS — R052 Subacute cough: Secondary | ICD-10-CM

## 2021-05-03 DIAGNOSIS — D631 Anemia in chronic kidney disease: Secondary | ICD-10-CM | POA: Diagnosis not present

## 2021-05-03 DIAGNOSIS — E7800 Pure hypercholesterolemia, unspecified: Secondary | ICD-10-CM | POA: Diagnosis not present

## 2021-05-03 DIAGNOSIS — Z951 Presence of aortocoronary bypass graft: Secondary | ICD-10-CM | POA: Diagnosis not present

## 2021-05-03 DIAGNOSIS — M170 Bilateral primary osteoarthritis of knee: Secondary | ICD-10-CM | POA: Diagnosis not present

## 2021-05-03 LAB — RESPIRATORY PANEL BY PCR

## 2021-05-03 LAB — LACTIC ACID, PLASMA
Lactic Acid, Venous: 1.8 mmol/L (ref 0.5–1.9)
Lactic Acid, Venous: 2.5 mmol/L (ref 0.5–1.9)

## 2021-05-03 LAB — HEMOGLOBIN A1C
Hgb A1c MFr Bld: 6.6 % — ABNORMAL HIGH (ref 4.8–5.6)
Mean Plasma Glucose: 142.72 mg/dL

## 2021-05-03 LAB — CBG MONITORING, ED
Glucose-Capillary: 165 mg/dL — ABNORMAL HIGH (ref 70–99)
Glucose-Capillary: 190 mg/dL — ABNORMAL HIGH (ref 70–99)
Glucose-Capillary: 221 mg/dL — ABNORMAL HIGH (ref 70–99)

## 2021-05-03 LAB — EXPECTORATED SPUTUM ASSESSMENT W GRAM STAIN, RFLX TO RESP C

## 2021-05-03 LAB — GLUCOSE, CAPILLARY: Glucose-Capillary: 237 mg/dL — ABNORMAL HIGH (ref 70–99)

## 2021-05-03 LAB — PROCALCITONIN: Procalcitonin: 0.19 ng/mL

## 2021-05-03 LAB — RESP PANEL BY RT-PCR (FLU A&B, COVID) ARPGX2
Influenza A by PCR: NEGATIVE
Influenza B by PCR: NEGATIVE
SARS Coronavirus 2 by RT PCR: NEGATIVE

## 2021-05-03 LAB — TROPONIN I (HIGH SENSITIVITY)
Troponin I (High Sensitivity): 23 ng/L — ABNORMAL HIGH (ref <18)
Troponin I (High Sensitivity): 21 ng/L — ABNORMAL HIGH (ref <18)

## 2021-05-03 MED ORDER — METHYLPREDNISOLONE SODIUM SUCC 40 MG IJ SOLR
40.0000 mg | Freq: Two times a day (BID) | INTRAMUSCULAR | Status: DC
  Administered 2021-05-03 – 2021-05-05 (×5): 40 mg via INTRAVENOUS
  Filled 2021-05-03 (×5): qty 1

## 2021-05-03 MED ORDER — PANTOPRAZOLE SODIUM 40 MG PO TBEC
40.0000 mg | DELAYED_RELEASE_TABLET | Freq: Two times a day (BID) | ORAL | Status: DC
  Administered 2021-05-03 – 2021-05-05 (×4): 40 mg via ORAL
  Filled 2021-05-03 (×5): qty 1

## 2021-05-03 MED ORDER — HYDRALAZINE HCL 20 MG/ML IJ SOLN: 5.0000 mg | INTRAMUSCULAR | Status: DC | PRN

## 2021-05-03 MED ORDER — ATORVASTATIN CALCIUM 20 MG PO TABS
40.0000 mg | ORAL_TABLET | Freq: Every day | ORAL | Status: DC
  Administered 2021-05-03 – 2021-05-05 (×3): 40 mg via ORAL
  Filled 2021-05-03 (×3): qty 2

## 2021-05-03 MED ORDER — DM-GUAIFENESIN ER 30-600 MG PO TB12
1.0000 | ORAL_TABLET | Freq: Two times a day (BID) | ORAL | Status: DC | PRN
  Administered 2021-05-03 – 2021-05-04 (×2): 1 via ORAL
  Filled 2021-05-03 (×2): qty 1

## 2021-05-03 MED ORDER — NITROGLYCERIN 0.4 MG SL SUBL: 0.4000 mg | SUBLINGUAL_TABLET | SUBLINGUAL | Status: DC | PRN

## 2021-05-03 MED ORDER — AZITHROMYCIN 250 MG PO TABS
250.0000 mg | ORAL_TABLET | Freq: Every day | ORAL | Status: DC
  Administered 2021-05-04 – 2021-05-05 (×2): 250 mg via ORAL
  Filled 2021-05-03 (×2): qty 1

## 2021-05-03 MED ORDER — IPRATROPIUM-ALBUTEROL 0.5-2.5 (3) MG/3ML IN SOLN
3.0000 mL | RESPIRATORY_TRACT | Status: DC
  Administered 2021-05-03 – 2021-05-04 (×6): 3 mL via RESPIRATORY_TRACT
  Filled 2021-05-03 (×6): qty 3

## 2021-05-03 MED ORDER — ONDANSETRON HCL 4 MG/2ML IJ SOLN: 4.0000 mg | Freq: Three times a day (TID) | INTRAMUSCULAR | Status: DC | PRN

## 2021-05-03 MED ORDER — ISOSORBIDE MONONITRATE ER 60 MG PO TB24
60.0000 mg | ORAL_TABLET | Freq: Every day | ORAL | Status: DC
  Administered 2021-05-03 – 2021-05-05 (×3): 60 mg via ORAL
  Filled 2021-05-03 (×3): qty 1

## 2021-05-03 MED ORDER — AZITHROMYCIN 500 MG PO TABS
500.0000 mg | ORAL_TABLET | Freq: Once | ORAL | Status: AC
  Administered 2021-05-03: 500 mg via ORAL
  Filled 2021-05-03: qty 1

## 2021-05-03 MED ORDER — INSULIN ASPART 100 UNIT/ML IJ SOLN
0.0000 [IU] | Freq: Three times a day (TID) | INTRAMUSCULAR | Status: DC
  Administered 2021-05-03 (×2): 2 [IU] via SUBCUTANEOUS
  Administered 2021-05-03: 3 [IU] via SUBCUTANEOUS
  Administered 2021-05-04 (×2): 2 [IU] via SUBCUTANEOUS
  Administered 2021-05-04: 1 [IU] via SUBCUTANEOUS
  Administered 2021-05-05 (×2): 2 [IU] via SUBCUTANEOUS
  Filled 2021-05-03 (×7): qty 1

## 2021-05-03 MED ORDER — ASPIRIN 81 MG PO CHEW
81.0000 mg | CHEWABLE_TABLET | Freq: Every day | ORAL | Status: DC
  Administered 2021-05-03 – 2021-05-05 (×3): 81 mg via ORAL
  Filled 2021-05-03 (×3): qty 1

## 2021-05-03 MED ORDER — SODIUM CHLORIDE 0.9 % IV BOLUS
1000.0000 mL | Freq: Once | INTRAVENOUS | Status: AC
  Administered 2021-05-03: 1000 mL via INTRAVENOUS

## 2021-05-03 MED ORDER — SODIUM CHLORIDE 0.9 % IV SOLN: INTRAVENOUS | Status: DC

## 2021-05-03 MED ORDER — ACETAMINOPHEN 325 MG PO TABS
650.0000 mg | ORAL_TABLET | Freq: Four times a day (QID) | ORAL | Status: DC | PRN
  Administered 2021-05-04: 650 mg via ORAL
  Filled 2021-05-03: qty 2

## 2021-05-03 MED ORDER — LORATADINE 10 MG PO TABS: 10.0000 mg | ORAL_TABLET | Freq: Every day | ORAL | Status: DC | PRN

## 2021-05-03 MED ORDER — ALBUTEROL SULFATE (2.5 MG/3ML) 0.083% IN NEBU: 2.5000 mg | INHALATION_SOLUTION | RESPIRATORY_TRACT | Status: DC | PRN

## 2021-05-03 MED ORDER — AZITHROMYCIN 250 MG PO TABS: ORAL_TABLET | ORAL | 0 refills | Status: DC

## 2021-05-03 MED ORDER — CARVEDILOL 25 MG PO TABS
25.0000 mg | ORAL_TABLET | Freq: Two times a day (BID) | ORAL | Status: DC
  Administered 2021-05-03 – 2021-05-05 (×4): 25 mg via ORAL
  Filled 2021-05-03 (×4): qty 1

## 2021-05-03 MED ORDER — INSULIN ASPART 100 UNIT/ML IJ SOLN
0.0000 [IU] | Freq: Every day | INTRAMUSCULAR | Status: DC
  Administered 2021-05-03: 2 [IU] via SUBCUTANEOUS
  Filled 2021-05-03: qty 1

## 2021-05-03 MED ORDER — ENOXAPARIN SODIUM 40 MG/0.4ML IJ SOSY
40.0000 mg | PREFILLED_SYRINGE | INTRAMUSCULAR | Status: DC
  Administered 2021-05-03 – 2021-05-04 (×2): 40 mg via SUBCUTANEOUS
  Filled 2021-05-03 (×2): qty 0.4

## 2021-05-03 MED ORDER — AMLODIPINE BESYLATE 10 MG PO TABS
10.0000 mg | ORAL_TABLET | Freq: Every day | ORAL | Status: DC
  Administered 2021-05-03 – 2021-05-05 (×3): 10 mg via ORAL
  Filled 2021-05-03: qty 1
  Filled 2021-05-03: qty 2
  Filled 2021-05-03: qty 1

## 2021-05-03 MED ORDER — IOHEXOL 350 MG/ML SOLN
75.0000 mL | Freq: Once | INTRAVENOUS | Status: AC | PRN
  Administered 2021-05-03: 75 mL via INTRAVENOUS

## 2021-05-03 NOTE — ED Notes (Signed)
RN unable to obtain repeat lab work. Lab called to collect blood specimens

## 2021-05-03 NOTE — ED Notes (Signed)
See triage note, pt reports frequent cough for the past month and chest pain with cough and shob. Able to speak in complete sentences. Alert and oriented. Call bell in reach.

## 2021-05-03 NOTE — H&P (Addendum)
History and Physical    Dale Cook. [id number] DOB: [birth year] DOA: 05/03/2021  Referring MD/NP/PA:   PCP: Albina Billet, MD   Patient coming from:  The patient is coming from home.  At baseline, pt is independent for most of ADL.        Chief Complaint: cough and mild SOB  HPI: Dale Cook. is a 75 y.o. male with medical history significant of hypertension, hyperlipidemia, GERD, CAD, CABG, dCHF, CKD-3A, GERD, who presents with cough and shortness of breath.  Patient states that he has been having cough for several weeks, which has worsened in the past several days.  He coughs up yellow sputum. His coughing is worsened at night.  Patient has mild shortness of breath, no fever or chills.  Patient does not have chest pain on resting, but had some chest pain on coughing earlier, which has resolved.  Currently no active chest pain.  Patient states that occasionally he has wheezing.  No nausea, vomiting, diarrhea or abdominal pain no symptoms of UTI.  No leg edema.  ED Course: pt was found to have WBC 19.2, troponin level 28, 25, BNP 218, negative COVID PCR, worsening renal function, temperature normal, blood pressure 115/65, heart rate 94, RR 32, oxygen saturation 95% on room air.  Chest x-ray negative.  CT angiogram is negative for PE, but showed cardiomegaly and emphysema.  Patient is admitted to progressive bed as inpatient.   Review of Systems:   General: no fevers, chills, no body weight gain, has fatigue HEENT: no blurry vision, hearing changes or sore throat Respiratory: has dyspnea, coughing, wheezing CV: no chest pain, no palpitations GI: no nausea, vomiting, abdominal pain, diarrhea, constipation GU: no dysuria, burning on urination, increased urinary frequency, hematuria  Ext: no leg edema Neuro: no unilateral weakness, numbness, or tingling, no vision change or hearing loss Skin: no rash, no skin tear. MSK: No muscle spasm, no deformity, no limitation  of range of movement in spin Heme: No easy bruising.  Travel history: No recent long distant travel.  Allergy: No Known Allergies  Past Medical History:  Diagnosis Date   Arthritis    "hands, knees" (01/09/2017)   CAD (coronary artery disease)    a. 12/2016 Cath: LM nl, LAD 95p, 32m, 60/90d, LCX 80p/m, 181m, OM1 68m, OM2 90, RCA 99p, Ef 55-65%; b. 12/2016 CABG x 4 (LIMA->mLAD, VG->dLAD, VG->OM1->OM2); c. 11/2018 MV: EF 66%, no ischemia.   Cardiac murmur    Grade II/VI early peaking systolic in aortic area   CHF (congestive heart failure) (HCC)    Essential hypertension    GERD (gastroesophageal reflux disease)    High cholesterol    History of echocardiogram    a. 12/2016 Echo: EF 65-70%.   Left carotid bruit    Wears dentures    partial upper    Past Surgical History:  Procedure Laterality Date   CATARACT EXTRACTION W/PHACO Left 06/12/2020   Procedure: CATARACT EXTRACTION PHACO AND INTRAOCULAR LENS PLACEMENT (IOC) LEFT 4.36 00:51.9 8.4%;  Surgeon: Leandrew Koyanagi, MD;  Location: [place];  Service: Ophthalmology;  Laterality: Left;   COLONOSCOPY WITH PROPOFOL N/A 11/09/2016   Procedure: COLONOSCOPY WITH PROPOFOL;  Surgeon: Robert Bellow, MD;  Location: [facility];  Service: Endoscopy;  Laterality: N/A;   COLONOSCOPY WITH PROPOFOL N/A 11/29/2019   Procedure: COLONOSCOPY WITH PROPOFOL;  Surgeon: Robert Bellow, MD;  Location: [facility];  Service: Endoscopy;  Laterality: N/A;   CORONARY ARTERY BYPASS GRAFT  N/A 01/12/2017   CABG x 4: LIMA to mLAD, SVG to dLAD, sequential SVG to OM1-OM2, EVH via left thigh and leg;  Surgeon: Rexene Alberts, MD;  Location: [place];  Service: Open Heart Surgery;  Laterality: N/A;   INTRAOPERATIVE TRANSESOPHAGEAL ECHOCARDIOGRAM N/A 01/12/2017   Procedure: INTRAOPERATIVE TRANSESOPHAGEAL ECHOCARDIOGRAM;  Surgeon: Rexene Alberts, MD;  Location: [place];  Service: Open Heart Surgery;  Laterality: N/A;   LEFT HEART CATH AND  CORONARY ANGIOGRAPHY Left 01/09/2017   Procedure: Left Heart Cath and Coronary Angiography;  Surgeon: Wellington Hampshire, MD;  Location: [place] [facility];  Service: Cardiovascular;  Laterality: Left;   TOTAL KNEE ARTHROPLASTY Right 01/18/2021   Procedure: TOTAL KNEE ARTHROPLASTY;  Surgeon: Lovell Sheehan, MD;  Location: [facility];  Service: Orthopedics;  Laterality: Right;    Social History:  reports that he has never smoked. He has never used smokeless tobacco. He reports that he does not drink alcohol and does not use drugs.  Family History:  Family History  Problem Relation Age of Onset   Heart attack Mother    Colon cancer Brother      Prior to Admission medications   Medication Sig Start Date End Date Taking? Authorizing Provider  amLODipine (NORVASC) 10 MG tablet TAKE 1 TABLET EVERY DAY 04/19/21  Yes Wellington Hampshire, MD  aspirin 81 MG chewable tablet Chew 1 tablet (81 mg total) by mouth 2 (two) times daily. Patient taking differently: Chew 81 mg by mouth daily. 01/20/21  Yes Carlynn Spry, PA-C  atorvastatin (LIPITOR) 40 MG tablet Take 40 mg by mouth daily.   Yes [provider]  azithromycin (ZITHROMAX) 250 MG tablet Take 1 a day for 4 days 05/03/21  Yes Alfred Levins, [place], MD  carvedilol (COREG) 25 MG tablet TAKE 1 TABLET TWICE DAILY 02/24/21  Yes Wellington Hampshire, MD  dextromethorphan-guaiFENesin (MUCINEX DM) 30-600 MG 12hr tablet Take 1 tablet by mouth 2 (two) times daily as needed.   Yes [provider]  furosemide (LASIX) 20 MG tablet Take 1 tablet (20 mg total) by mouth daily. 12/23/20  Yes Darylene Price A, FNP  isosorbide mononitrate (IMDUR) 60 MG 24 hr tablet Take 1 tablet (60 mg total) by mouth daily. PLEASE SCHEDULE OFFICE VISIT FOR REFILLS. THANK YOU! 04/07/21  Yes Wellington Hampshire, MD  loratadine (CLARITIN) 10 MG tablet Take 10 mg by mouth daily as needed for allergies.   Yes [provider]  moexipril (UNIVASC) 15 MG tablet Take 15 mg by  mouth at bedtime.  10/24/18  Yes [provider]  pantoprazole (PROTONIX) 40 MG tablet TAKE 1 TABLET EVERY DAY 01/12/21  Yes Wellington Hampshire, MD  meloxicam (MOBIC) 15 MG tablet Take 15 mg by mouth at bedtime. Patient not taking: Reported on 05/03/2021 10/05/18   [provider]    Physical Exam: Vitals:   05/03/21 0134 05/03/21 0354 05/03/21 0500 05/03/21 1215  BP: 136/70 (!) 138/52 115/65 138/71  Pulse: 83 77 73 91  Resp: 16 (!) 32 (!) 21 (!) 35  Temp:      TempSrc:      SpO2: 98% 95% 95% 93%  Weight:      Height:       General: Not in acute distress HEENT:       Eyes: PERRL, EOMI, no scleral icterus.       ENT: No discharge from the ears and nose, no pharynx injection, no tonsillar enlargement.  Neck: No JVD, no mass felt. Heme: No neck lymph node enlargement. Cardiac: S1/S2, RRR, No gallops or rubs. Respiratory: Has coarse breathing sound and mild wheezing bilaterally. GI: Soft, nondistended, nontender, no rebound pain, no organomegaly, BS present. GU: No hematuria Ext: No pitting leg edema bilaterally. 1+DP/PT pulse bilaterally. Musculoskeletal: No joint deformities, No joint redness or warmth, no limitation of ROM in spin. Skin: No rashes.  Neuro: Alert, oriented X3, cranial nerves II-XII grossly intact, moves all extremities normally. Psych: Patient is not psychotic, no suicidal or hemocidal ideation.  Labs on Admission: I have personally reviewed following labs and imaging studies  CBC: Recent Labs  Lab 05/02/21 1746  WBC 19.2*  HGB 11.2*  HCT 35.1*  MCV 83.8  PLT [id number]   Basic Metabolic Panel: Recent Labs  Lab 04/28/21 0823 05/02/21 1746  NA 140 131*  K 4.5 3.7  CL 101 98  CO2 25 24  GLUCOSE 115* 285*  BUN 13 21  CREATININE 1.15 2.03*  CALCIUM 9.2 8.2*   GFR: Estimated Creatinine Clearance: 39.7 mL/min (A) (by C-G formula based on SCr of 2.03 mg/dL (H)). Liver Function Tests: Recent Labs  Lab 04/28/21 0823 05/02/21 1746   AST 10 22  ALT 11 14  ALKPHOS 149* 93  BILITOT 0.3 0.9  PROT 7.5 7.3  ALBUMIN 4.0 3.1*   No results for input(s): LIPASE, AMYLASE in the last 168 hours. No results for input(s): AMMONIA in the last 168 hours. Coagulation Profile: No results for input(s): INR, PROTIME in the last 168 hours. Cardiac Enzymes: No results for input(s): CKTOTAL, CKMB, CKMBINDEX, TROPONINI in the last 168 hours. BNP (last 3 results) No results for input(s): PROBNP in the last 8760 hours. HbA1C: Recent Labs    05/03/21 1042  HGBA1C 6.6*   CBG: Recent Labs  Lab 05/03/21 0901 05/03/21 1336  GLUCAP 165* 190*   Lipid Profile: No results for input(s): CHOL, HDL, LDLCALC, TRIG, CHOLHDL, LDLDIRECT in the last 72 hours. Thyroid Function Tests: No results for input(s): TSH, T4TOTAL, FREET4, T3FREE, THYROIDAB in the last 72 hours. Anemia Panel: No results for input(s): VITAMINB12, FOLATE, FERRITIN, TIBC, IRON, RETICCTPCT in the last 72 hours. Urine analysis:    Component Value Date/Time   COLORURINE YELLOW (A) 01/08/2021 1120   APPEARANCEUR CLEAR (A) 01/08/2021 1120   LABSPEC 1.014 01/08/2021 1120   PHURINE 5.0 01/08/2021 1120   GLUCOSEU NEGATIVE 01/08/2021 1120   HGBUR NEGATIVE 01/08/2021 1120   BILIRUBINUR NEGATIVE 01/08/2021 1120   KETONESUR NEGATIVE 01/08/2021 1120   PROTEINUR NEGATIVE 01/08/2021 1120   NITRITE NEGATIVE 01/08/2021 1120   LEUKOCYTESUR NEGATIVE 01/08/2021 1120   Sepsis Labs: @LABRCNTIP (procalcitonin:4,lacticidven:4) ) Recent Results (from the past 240 hour(s))  Resp Panel by RT-PCR (Flu A&B, Covid) Nasopharyngeal Swab     Status: None   Collection Time: 05/03/21  1:24 AM   Specimen: Nasopharyngeal Swab; Nasopharyngeal(NP) swabs in vial transport medium  Result Value Ref Range Status   SARS Coronavirus 2 by RT PCR NEGATIVE NEGATIVE Final    Comment: (NOTE) SARS-CoV-2 target nucleic acids are NOT DETECTED.  The SARS-CoV-2 RNA is generally detectable in upper  respiratory specimens during the acute phase of infection. The lowest concentration of SARS-CoV-2 viral copies this assay can detect is 138 copies/mL. A negative result does not preclude SARS-Cov-2 infection and should not be used as the sole basis for treatment or other patient management decisions. A negative result may occur with  improper specimen collection/handling, submission of specimen other than nasopharyngeal swab, presence  of viral mutation(s) within the areas targeted by this assay, and inadequate number of viral copies(<138 copies/mL). A negative result must be combined with clinical observations, patient history, and epidemiological information. The expected result is Negative.  Fact Sheet for Patients:  EntrepreneurPulse.com.au  Fact Sheet for Healthcare Providers:  IncredibleEmployment.be  This test is no t yet approved or cleared by the [place] FDA and  has been authorized for detection and/or diagnosis of SARS-CoV-2 by FDA under an Emergency Use Authorization (EUA). This EUA will remain  in effect (meaning this test can be used) for the duration of the COVID-19 declaration under Section 564(b)(1) of the Act, 21 U.S.C.section 360bbb-3(b)(1), unless the authorization is terminated  or revoked sooner.       Influenza A by PCR NEGATIVE NEGATIVE Final   Influenza B by PCR NEGATIVE NEGATIVE Final    Comment: (NOTE) The Xpert Xpress SARS-CoV-2/FLU/RSV plus assay is intended as an aid in the diagnosis of influenza from Nasopharyngeal swab specimens and should not be used as a sole basis for treatment. Nasal washings and aspirates are unacceptable for Xpert Xpress SARS-CoV-2/FLU/RSV testing.  Fact Sheet for Patients: EntrepreneurPulse.com.au  Fact Sheet for Healthcare Providers: IncredibleEmployment.be  This test is not yet approved or cleared by the [place] FDA and has been  authorized for detection and/or diagnosis of SARS-CoV-2 by FDA under an Emergency Use Authorization (EUA). This EUA will remain in effect (meaning this test can be used) for the duration of the COVID-19 declaration under Section 564(b)(1) of the Act, 21 U.S.C. section 360bbb-3(b)(1), unless the authorization is terminated or revoked.  Performed at [facility], [address] [place], [place] [address]   Expectorated Sputum Assessment w Gram Stain, Rflx to Resp Cult     Status: None   Collection Time: 05/03/21 12:19 PM   Specimen: Expectorated Sputum  Result Value Ref Range Status   Specimen Description EXPECTORATED SPUTUM  Final   Special Requests NONE  Final   Sputum evaluation   Final    THIS SPECIMEN IS ACCEPTABLE FOR SPUTUM CULTURE Performed at [facility], [address], [place], [place] [address]    Report Status 05/03/2021 FINAL  Final     Radiological Exams on Admission: DG Chest 2 View  Result Date: 05/02/2021 CLINICAL DATA:  Cough and short of breath.  Chest pain EXAM: CHEST - 2 VIEW COMPARISON:  09/19/2020 FINDINGS: Postop CABG. Heart size mildly enlarged. Negative for heart failure. Lungs clear without infiltrate or effusion. IMPRESSION: No active cardiopulmonary disease. Electronically Signed   By: Franchot Gallo M.D.   On: 05/02/2021 18:24   CT Angio Chest PE W and/or Wo Contrast  Result Date: 05/03/2021 CLINICAL DATA:  Cough, chest pain, shortness of breath. High probability suspicion for PE. EXAM: CT ANGIOGRAPHY CHEST WITH CONTRAST TECHNIQUE: Multidetector CT imaging of the chest was performed using the standard protocol during bolus administration of intravenous contrast. Multiplanar CT image reconstructions and MIPs were obtained to evaluate the vascular anatomy. CONTRAST:  67mL OMNIPAQUE IOHEXOL 350 MG/ML SOLN COMPARISON:  CT scan of the chest 09/19/2020 FINDINGS: Cardiovascular: Satisfactory opacification of the pulmonary arteries to the  segmental level. No evidence of pulmonary embolism. The heart is at the upper limits of normal for size. Extensive calcifications throughout the native coronary arteries. Patient is status post median sternotomy for CABG. No pericardial effusion. Mediastinum/Nodes: No enlarged mediastinal, hilar, or axillary lymph nodes. Thyroid gland, trachea, and esophagus demonstrate no significant findings. Lungs/Pleura: Mild paraseptal pulmonary emphysema. Moderate  respiratory motion artifact. No suspicious pulmonary mass or nodule. No evidence of pulmonary edema, pleural effusion or pneumothorax. Mild dependent atelectasis. Upper Abdomen: No acute abnormality. Musculoskeletal: No chest wall abnormality. No acute or significant osseous findings. Review of the MIP images confirms the above findings. IMPRESSION: 1. No evidence of acute pulmonary embolus, pneumonia or other acute cardiopulmonary process. 2. Borderline cardiomegaly. 3. Native coronary artery disease with surgical changes of prior CABG. 4. Mild paraseptal pulmonary emphysema. 5. Mild dependent atelectasis. Aortic Atherosclerosis (ICD10-I70.0) and Emphysema (ICD10-J43.9). Electronically Signed   By: Malachy Moan M.D.   On: 05/03/2021 05:49     EKG: I have personally reviewed.  Sinus rhythm, QTC 403, LAD, low voltage, PVC, poor R wave progression  Assessment/Plan Principal Problem:   Acute bronchitis Active Problems:   Essential hypertension   Hyperlipidemia with target low density lipoprotein (LDL) cholesterol less than 70 mg/dL   Chest pain   Chronic diastolic CHF (congestive heart failure) (HCC)   CAD (coronary artery disease)   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)   Elevated troponin   Hyperglycemia   Severe sepsis (HCC)  Severe sepsis due to possible acute bronchitis: Patient has productive cough, mild shortness of breath, occasional wheezing for several weeks.  Chest x-ray negative.  Potential differential  diagnosis include acute bronchitis versus upper respiratory viral infection.  Patient has leukocytosis with WBC 19.2, heart rate 94, RR 33, meets criteria for severe sepsis, lactic acid 2.5. will start antibiotics.  -will admit to progressive unit as inpatient -Bronchodilators -Solu-Medrol 40 mg IV bid -Azithromycin orally -Mucinex for cough  -Follow up blood culture x2, sputum culture -check RVP -will get Procalcitonin and trend lactic acid levels per sepsis protocol. -IVF: 1L of NS, then NS 75 cc/h, pt has hx of dCHF, limiting aggressive IV fluid treatment  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: Baseline creatinine 1.1-1.3, his creatinine is at 2.03, BUN 21, likely due to dehydration and continuation of Lasix, Mobic and moexipril. -will not Lasix, Mobic and moexipril. -IVF as above  Essential hypertension -Hold lasix and Moexipril due to worsening renal function -IV IV hydralazine as needed -Amlodipine, imdur  Hyperlipidemia with target low density lipoprotein (LDL) cholesterol less than 70 mg/dL -Lipitor  Chest pain, elevated troponin and CAD: s/p of CABG. Patient chest pain has resolved, currently no active chest pain.  Troponin level 28, 25, minimally elevated, likely due to demand ischemia. -Aspirin, Lipitor, Imdur, coreg -prn NTG -Trend troponin -Check A1c, FLP  Chronic diastolic CHF (congestive heart failure) (HCC): 2D echo on 10/15/2020 showed EF of 60 to 65% with grade 1 diastolic dysfunction.  Patient has mildly elevated BNP 218, but no leg edema or JVD.  No pulm edema chest x-ray.  CHF seem to be compensated. -Hold Lasix due to worsening renal function  Hyperglycemia: Patient admitted for prediabetes.  Blood sugar 285 today.  Recent A1c 6.4 on 01/11/2017. -Sliding scale insulin -Follow-up A1c  GERD: -Increase Protonix from 40 mg daily to twice daily since patient has worsening cough at night,           DVT ppx: SQ Lovenox Code Status:  Full code Family Communication:   Yes, patient's  daughter  at bed side Disposition Plan:  Anticipate discharge back to previous environment Consults called:  none Admission status and Level of care: Progressive Cardiac:   as inpt       Status is: Inpatient  Remains inpatient appropriate because: Patient has multiple committees, now presents  with coughing and shortness breath for several weeks, patient likely has acute bronchitis, meets criteria for sepsis, also has elevated troponin, worsening renal function.  Her presentation is highly complicated patient is at high risk of deteriorating.  Will need to be treated in hospital for at least 2 days.          Date of Service 05/03/2021    Ivor Costa [facility]   If 7PM-7AM, please contact night-coverage [email or web address] 05/03/2021, 3:05 PM

## 2021-05-03 NOTE — ED Provider Notes (Signed)
[facility] Emergency Department Provider Note  ____________________________________________  Time seen: Approximately 4:34 AM  I have reviewed the triage vital signs and the nursing notes.   HISTORY  Chief Complaint Chest Pain and Shortness of Breath   HPI Dale Cook. is a 75 y.o. male with history of CAD sp CABG, HFpEF, HTN, HLD who presents for evaluation of cough. Patient reports that over the last month he has a cough productive of green phlegm. He reports that the cough only happens at night when he is laying down and sleeping. The cough wakes him up several times a time. He denies CP, SOB, fever, hemoptysis associated with it. Some times he has wheezing but that clears when he coughs. He feels like he has chest congestion. He denied several times any chest pain or sob associated with it. He reports that as soon as he wakes up in the morning, the cough goers away until the following night.  He denies orthopnea, endorses compliance with his diuretics, denies weight gain, leg swelling.  No prior history of PE or DVT, no recent travel immobilization, no leg pain or swelling, no hemoptysis or exogenous hormones.  Past Medical History:  Diagnosis Date   Arthritis    "hands, knees" (01/09/2017)   CAD (coronary artery disease)    a. 12/2016 Cath: LM nl, LAD 95p, 60m, 60/90d, LCX 80p/m, 161m, OM1 44m, OM2 90, RCA 99p, Ef 55-65%; b. 12/2016 CABG x 4 (LIMA->mLAD, VG->dLAD, VG->OM1->OM2); c. 11/2018 MV: EF 66%, no ischemia.   Cardiac murmur    Grade II/VI early peaking systolic in aortic area   CHF (congestive heart failure) (HCC)    Essential hypertension    GERD (gastroesophageal reflux disease)    High cholesterol    History of echocardiogram    a. 12/2016 Echo: EF 65-70%.   Left carotid bruit    Wears dentures    partial upper    Patient Active Problem List   Diagnosis Date Noted   AKI (acute kidney injury) ([place]) 05/03/2021   History of total  knee arthroplasty, right 01/18/2021   Chest pain 12/07/2018   Hyperlipidemia with target low density lipoprotein (LDL) cholesterol less than 70 mg/dL 01/16/2017   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   Coronary artery disease involving native coronary artery with angina pectoris ([place]) 01/09/2017   Unstable angina ([place])    Essential hypertension    Encounter for screening colonoscopy 09/21/2016    Past Surgical History:  Procedure Laterality Date   CATARACT EXTRACTION W/PHACO Left 06/12/2020   Procedure: CATARACT EXTRACTION PHACO AND INTRAOCULAR LENS PLACEMENT (IOC) LEFT 4.36 00:51.9 8.4%;  Surgeon: Leandrew Koyanagi, MD;  Location: [facility];  Service: Ophthalmology;  Laterality: Left;   COLONOSCOPY WITH PROPOFOL N/A 11/09/2016   Procedure: COLONOSCOPY WITH PROPOFOL;  Surgeon: Robert Bellow, MD;  Location: [facility];  Service: Endoscopy;  Laterality: N/A;   COLONOSCOPY WITH PROPOFOL N/A 11/29/2019   Procedure: COLONOSCOPY WITH PROPOFOL;  Surgeon: Robert Bellow, MD;  Location: [facility];  Service: Endoscopy;  Laterality: N/A;   CORONARY ARTERY BYPASS GRAFT N/A 01/12/2017   CABG x 4: LIMA to mLAD, SVG to dLAD, sequential SVG to OM1-OM2, EVH via left thigh and leg;  Surgeon: Rexene Alberts, MD;  Location: [facility];  Service: Open Heart Surgery;  Laterality: N/A;   INTRAOPERATIVE TRANSESOPHAGEAL ECHOCARDIOGRAM N/A 01/12/2017   Procedure: INTRAOPERATIVE TRANSESOPHAGEAL ECHOCARDIOGRAM;  Surgeon: Rexene Alberts, MD;  Location: [facility];  Service: Open Heart  Surgery;  Laterality: N/A;   LEFT HEART CATH AND CORONARY ANGIOGRAPHY Left 01/09/2017   Procedure: Left Heart Cath and Coronary Angiography;  Surgeon: Iran Ouch, MD;  Location: [facility];  Service: Cardiovascular;  Laterality: Left;   TOTAL KNEE ARTHROPLASTY Right 01/18/2021   Procedure: TOTAL KNEE ARTHROPLASTY;  Surgeon: Lyndle Herrlich, MD;  Location: [facility];  Service: Orthopedics;  Laterality: Right;     Prior to Admission medications   Medication Sig Start Date End Date Taking? Authorizing Provider  amLODipine (NORVASC) 10 MG tablet TAKE 1 TABLET EVERY DAY 04/19/21  Yes Iran Ouch, MD  aspirin 81 MG chewable tablet Chew 1 tablet (81 mg total) by mouth 2 (two) times daily. Patient taking differently: Chew 81 mg by mouth daily. 01/20/21  Yes [name], PA-C  atorvastatin (LIPITOR) 40 MG tablet Take 40 mg by mouth daily.   Yes [provider]  azithromycin (ZITHROMAX) 250 MG tablet Take 1 a day for 4 days 05/03/21  Yes Don Perking, [place], MD  carvedilol (COREG) 25 MG tablet TAKE 1 TABLET TWICE DAILY 02/24/21  Yes Iran Ouch, MD  dextromethorphan-guaiFENesin (MUCINEX DM) 30-600 MG 12hr tablet Take 1 tablet by mouth 2 (two) times daily as needed.   Yes [provider]  furosemide (LASIX) 20 MG tablet Take 1 tablet (20 mg total) by mouth daily. 12/23/20  Yes Clarisa Kindred A, FNP  isosorbide mononitrate (IMDUR) 60 MG 24 hr tablet Take 1 tablet (60 mg total) by mouth daily. PLEASE SCHEDULE OFFICE VISIT FOR REFILLS. THANK YOU! 04/07/21  Yes Iran Ouch, MD  loratadine (CLARITIN) 10 MG tablet Take 10 mg by mouth daily as needed for allergies.   Yes [provider]  moexipril (UNIVASC) 15 MG tablet Take 15 mg by mouth at bedtime.  10/24/18  Yes [provider]  pantoprazole (PROTONIX) 40 MG tablet TAKE 1 TABLET EVERY DAY 01/12/21  Yes Iran Ouch, MD  meloxicam (MOBIC) 15 MG tablet Take 15 mg by mouth at bedtime. Patient not taking: Reported on 05/03/2021 10/05/18   [provider]    Allergies Patient has no known allergies.  Family History  Problem Relation Age of Onset   Heart attack Mother    Colon cancer Brother     Social History Social History   Tobacco Use   Smoking status: Never   Smokeless tobacco: Never  Vaping Use   Vaping Use: Never used  Substance Use Topics   Alcohol use: No   Drug use: No    Review  of Systems  Constitutional: Negative for fever. Eyes: Negative for visual changes. ENT: Negative for sore throat. Neck: No neck pain  Cardiovascular: Negative for chest pain. Respiratory: Negative for shortness of breath. + cough Gastrointestinal: Negative for abdominal pain, vomiting or diarrhea. Genitourinary: Negative for dysuria. Musculoskeletal: Negative for back pain. Skin: Negative for rash. Neurological: Negative for headaches, weakness or numbness. Psych: No SI or HI  ____________________________________________   PHYSICAL EXAM:  VITAL SIGNS: ED Triage Vitals  Enc Vitals Group     BP 05/02/21 1748 138/68     Pulse Rate 05/02/21 1748 94     Resp 05/02/21 1748 20     Temp 05/02/21 1748 100.1 F (37.8 C)     Temp Source 05/02/21 1748 Oral     SpO2 05/02/21 1748 94 %     Weight 05/02/21 1745 220 lb (99.8 kg)     Height 05/02/21 1745 6' (1.829 m)  Head Circumference --      Peak Flow --      Pain Score 05/02/21 1745 8     Pain Loc --      Pain Edu? --      Excl. in [place]? --     Constitutional: Alert and oriented. Well appearing and in no apparent distress. HEENT:      Head: Normocephalic and atraumatic.         Eyes: Conjunctivae are normal. Sclera is non-icteric.       Mouth/Throat: Mucous membranes are moist.       Neck: Supple with no signs of meningismus. Cardiovascular: Regular rate and rhythm. No murmurs, gallops, or rubs. 2+ symmetrical distal pulses are present in all extremities. No JVD. Respiratory: Normal respiratory effort. Lungs are clear to auscultation bilaterally.  Gastrointestinal: Soft, non tender, and non distended with positive bowel sounds. No rebound or guarding. Genitourinary: No CVA tenderness. Musculoskeletal:  No edema, cyanosis, or erythema of extremities. Neurologic: Normal speech and language. Face is symmetric. Moving all extremities. No gross focal neurologic deficits are appreciated. Skin: Skin is warm, dry and intact. No rash  noted. Psychiatric: Mood and affect are normal. Speech and behavior are normal.  ____________________________________________   LABS (all labs ordered are listed, but only abnormal results are displayed)  Labs Reviewed  CBC - Abnormal; Notable for the following components:      Result Value   WBC 19.2 (*)    RBC 4.19 (*)    Hemoglobin 11.2 (*)    HCT 35.1 (*)    All other components within normal limits  COMPREHENSIVE METABOLIC PANEL - Abnormal; Notable for the following components:   Sodium 131 (*)    Glucose, Bld 285 (*)    Creatinine, Ser 2.03 (*)    Calcium 8.2 (*)    Albumin 3.1 (*)    GFR, Estimated 34 (*)    All other components within normal limits  BRAIN NATRIURETIC PEPTIDE - Abnormal; Notable for the following components:   B Natriuretic Peptide 218.6 (*)    All other components within normal limits  TROPONIN I (HIGH SENSITIVITY) - Abnormal; Notable for the following components:   Troponin I (High Sensitivity) 25 (*)    All other components within normal limits  TROPONIN I (HIGH SENSITIVITY) - Abnormal; Notable for the following components:   Troponin I (High Sensitivity) 28 (*)    All other components within normal limits  RESP PANEL BY RT-PCR (FLU A&B, COVID) [id number]   ____________________________________________  EKG  ED ECG REPORT I, Rudene Re, the attending physician, personally viewed and interpreted this ECG.  Sinus rhythm with rate of 91, normal intervals, normal axis, occasional PVCs, inferior Q waves with no ST elevation.  Unchanged from prior. ____________________________________________  RADIOLOGY  I have personally reviewed the images performed during this visit and I agree with the Radiologist's read.   Interpretation by Radiologist:  DG Chest 2 View  Result Date: 05/02/2021 CLINICAL DATA:  Cough and short of breath.  Chest pain EXAM: CHEST - 2 VIEW COMPARISON:  09/19/2020 FINDINGS: Postop CABG. Heart size mildly enlarged. Negative  for heart failure. Lungs clear without infiltrate or effusion. IMPRESSION: No active cardiopulmonary disease. Electronically Signed   By: Franchot Gallo M.D.   On: 05/02/2021 18:24   CT Angio Chest PE W and/or Wo Contrast  Result Date: 05/03/2021 CLINICAL DATA:  Cough, chest pain, shortness of breath. High probability suspicion for PE. EXAM: CT ANGIOGRAPHY CHEST WITH CONTRAST TECHNIQUE: Multidetector  CT imaging of the chest was performed using the standard protocol during bolus administration of intravenous contrast. Multiplanar CT image reconstructions and MIPs were obtained to evaluate the vascular anatomy. CONTRAST:  22mL OMNIPAQUE IOHEXOL 350 MG/ML SOLN COMPARISON:  CT scan of the chest 09/19/2020 FINDINGS: Cardiovascular: Satisfactory opacification of the pulmonary arteries to the segmental level. No evidence of pulmonary embolism. The heart is at the upper limits of normal for size. Extensive calcifications throughout the native coronary arteries. Patient is status post median sternotomy for CABG. No pericardial effusion. Mediastinum/Nodes: No enlarged mediastinal, hilar, or axillary lymph nodes. Thyroid gland, trachea, and esophagus demonstrate no significant findings. Lungs/Pleura: Mild paraseptal pulmonary emphysema. Moderate respiratory motion artifact. No suspicious pulmonary mass or nodule. No evidence of pulmonary edema, pleural effusion or pneumothorax. Mild dependent atelectasis. Upper Abdomen: No acute abnormality. Musculoskeletal: No chest wall abnormality. No acute or significant osseous findings. Review of the MIP images confirms the above findings. IMPRESSION: 1. No evidence of acute pulmonary embolus, pneumonia or other acute cardiopulmonary process. 2. Borderline cardiomegaly. 3. Native coronary artery disease with surgical changes of prior CABG. 4. Mild paraseptal pulmonary emphysema. 5. Mild dependent atelectasis. Aortic Atherosclerosis (ICD10-I70.0) and Emphysema (ICD10-J43.9).  Electronically Signed   By: Jacqulynn Cadet M.D.   On: 05/03/2021 05:49     ____________________________________________   PROCEDURES  Procedure(s) performed:yes .1-3 Lead EKG Interpretation Performed by: Rudene Re, MD Authorized by: Rudene Re, MD     Interpretation: non-specific     ECG rate assessment: normal     Rhythm: sinus rhythm     Ectopy: none     Conduction: normal     Critical Care performed: yes  CRITICAL CARE Performed by: Rudene Re  ?  Total critical care time: 30 min  Critical care time was exclusive of separately billable procedures and treating other patients.  Critical care was necessary to treat or prevent imminent or life-threatening deterioration.  Critical care was time spent personally by me on the following activities: development of treatment plan with patient and/or surrogate as well as nursing, discussions with consultants, evaluation of patient's response to treatment, examination of patient, obtaining history from patient or surrogate, ordering and performing treatments and interventions, ordering and review of laboratory studies, ordering and review of radiographic studies, pulse oximetry and re-evaluation of patient's condition.   ____________________________________________   INITIAL IMPRESSION / ASSESSMENT AND PLAN / ED COURSE  75 y.o. male with history of CAD sp CABG, HFpEF, HTN, HLD who presents for evaluation of cough.  Patient with a month of nocturnal cough productive of green phlegm.  No shortness of breath, fever, or chest pain associated with it.  MSE note states that the patient was having chest pain and shortness of breath.  I ask him several times and he continued to deny same chest pain or shortness of breath.  He reports that he came here today because he is tired of coughing and not being able to sleep well at night because of this.  I also reviewed his note from his recent visit to his cardiologist  just a few days ago where patient stated the same thing to his cardiologist that he felt great with no chest pain or shortness of breath.  Here he is in no respiratory distress, lungs are clear to auscultation.  Imaging studies with no signs of PE, pneumonia, CHF exacerbation.  CT does show signs of emphysema.  Therefore possibly mild bronchitis which I will treat with a Z-Pak especially with a WBC  of 19.  Labs showing mildly elevated BNP which is within patient's baseline, negative COVID and flu.  Creatinine trending up and currently 2.03 (baseline 1.2-1.5). GFR went from 58-60 to 34.  First troponin was slightly positive at 25 with a repeat slightly up to 28.  Therefore based on patient's history, worsening kidney function, positive troponins I will consult the hospitalist for admission.  Patient is on telemetry for close monitoring of cardiorespiratory status.  Note from his recent visit to the cardiologist has been reviewed.     _____________________________________________ Please note:  Patient was evaluated in Emergency Department today for the symptoms described in the history of present illness. Patient was evaluated in the context of the global COVID-19 pandemic, which necessitated consideration that the patient might be at risk for infection with the SARS-CoV-2 virus that causes COVID-19. Institutional protocols and algorithms that pertain to the evaluation of patients at risk for COVID-19 are in a state of rapid change based on information released by regulatory bodies including the CDC and federal and state organizations. These policies and algorithms were followed during the patient's care in the ED.  Some ED evaluations and interventions may be delayed as a result of limited staffing during the pandemic.   [place] Controlled Substance Database was reviewed by me. 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    Nita Sickle, MD 05/03/21 [phone]

## 2021-05-04 DIAGNOSIS — A419 Sepsis, unspecified organism: Secondary | ICD-10-CM | POA: Diagnosis not present

## 2021-05-04 DIAGNOSIS — J209 Acute bronchitis, unspecified: Secondary | ICD-10-CM | POA: Diagnosis not present

## 2021-05-04 LAB — BASIC METABOLIC PANEL
Anion gap: 7 (ref 5–15)
BUN: 20 mg/dL (ref 8–23)
CO2: 25 mmol/L (ref 22–32)
Calcium: 8.1 mg/dL — ABNORMAL LOW (ref 8.9–10.3)
Chloride: 103 mmol/L (ref 98–111)
Creatinine, Ser: 1.14 mg/dL (ref 0.61–1.24)
GFR, Estimated: >60 mL/min (ref >60)
Glucose, Bld: 169 mg/dL — ABNORMAL HIGH (ref 70–99)
Potassium: 4.1 mmol/L (ref 3.5–5.1)
Sodium: 135 mmol/L (ref 135–145)

## 2021-05-04 LAB — LIPID PANEL
Cholesterol: 90 mg/dL (ref 0–200)
HDL: 19 mg/dL — ABNORMAL LOW (ref >40)
LDL Cholesterol: 60 mg/dL (ref 0–99)
Total CHOL/HDL Ratio: 4.7 RATIO
Triglycerides: 56 mg/dL (ref <150)
VLDL: 11 mg/dL (ref 0–40)

## 2021-05-04 LAB — CBC
HCT: 32.4 % — ABNORMAL LOW (ref 39.0–52.0)
Hemoglobin: 10.4 g/dL — ABNORMAL LOW (ref 13.0–17.0)
MCH: 26.7 pg (ref 26.0–34.0)
MCHC: 32.1 g/dL (ref 30.0–36.0)
MCV: 83.3 fL (ref 80.0–100.0)
Platelets: 209 10*3/uL (ref 150–400)
RBC: 3.89 MIL/uL — ABNORMAL LOW (ref 4.22–5.81)
RDW: 15.4 % (ref 11.5–15.5)
WBC: 9.9 10*3/uL (ref 4.0–10.5)
nRBC: 0 % (ref 0.0–0.2)

## 2021-05-04 LAB — GLUCOSE, CAPILLARY
Glucose-Capillary: 153 mg/dL — ABNORMAL HIGH (ref 70–99)
Glucose-Capillary: 141 mg/dL — ABNORMAL HIGH (ref 70–99)
Glucose-Capillary: 174 mg/dL — ABNORMAL HIGH (ref 70–99)
Glucose-Capillary: 165 mg/dL — ABNORMAL HIGH (ref 70–99)

## 2021-05-04 NOTE — Progress Notes (Signed)
[facility] Progress Note  Patient: Dale Cook.    LA:[id number]  DOA: 05/03/2021     Date of Service: the patient was seen and examined on 05/04/2021  Chief Complaint  Patient presents with   Chest Pain   Shortness of Breath   Brief hospital course: Dale Cook. is a 75 y.o. male with medical history significant of hypertension, hyperlipidemia, GERD, CAD, CABG, dCHF, CKD-3A, GERD, who presents with cough and shortness of breath.   Patient states that he has been having cough for several weeks, which has worsened in the past several days.  He coughs up yellow sputum. His coughing is worsened at night.  Patient has mild shortness of breath, no fever or chills.  Patient does not have chest pain on resting, but had some chest pain on coughing earlier, which has resolved.  Currently no active chest pain.  Patient states that occasionally he has wheezing.  No nausea, vomiting, diarrhea or abdominal pain no symptoms of UTI.  No leg edema.   ED Course: pt was found to have WBC 19.2, troponin level 28, 25, BNP 218, negative COVID PCR, worsening renal function, temperature normal, blood pressure 115/65, heart rate 94, RR 32, oxygen saturation 95% on room air.  Chest x-ray negative.  CT angiogram is negative for PE, but showed cardiomegaly and emphysema.  Patient is admitted to progressive bed as inpatient.   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   Chest x-ray negative.  Potential differential diagnosis include acute bronchitis versus upper respiratory viral infection.  Patient has leukocytosis with WBC 19.2, heart rate 94, RR 33, meets criteria for severe sepsis, lactic acid 2.5--1.8 trended down to normal Continue bronchodilators -Continue Solu-Medrol 40 mg IV bid -Azithromycin orally -Mucinex for cough  -Follow up blood culture x2 NGTD,  sputum culture pending -RVP negative - Procalcitonin negative and lactic acid trended down s/p fluid   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: Baseline creatinine 1.1-1.3, his creatinine is at 2.03, BUN 21, likely due to dehydration and continuation of Lasix, Mobic and moexipril. -hold Lasix, Mobic and moexipril. S/p IVF , AKI resolved, creatinine 1.14 today    Essential hypertension -Hold lasix and Moexipril due to worsening renal function - use IV hydralazine as needed -Amlodipine, imdur   Hyperlipidemia with target low density lipoprotein (LDL) cholesterol less than 70 mg/dL -Lipitor   Chest pain, elevated troponin and CAD: s/p of CABG. Patient chest pain has resolved, currently no active chest pain.  Troponin level 28, 25, 21 minimally elevated and trended down, likely due to demand ischemia. -Aspirin, Lipitor, Imdur, coreg -prn NTG -FLP LDL 60   Chronic diastolic CHF (congestive heart failure) ([place]): 2D echo on 10/15/2020 showed EF of 60 to 65% with grade 1 diastolic dysfunction.  Patient has mildly elevated BNP 218, but no leg edema or JVD.  No pulm edema chest x-ray.  CHF seem to be compensated. -Hold Lasix due to worsening renal function   Hyperglycemia: Patient  admitted for prediabetes.  Blood sugar 285 today.  Recent A1c 6.4 on 01/11/2017. -Sliding scale insulin - A1c 6.6 well-controlled   GERD: -Increase Protonix from 40 mg daily to twice daily since patient has worsening cough at night,     Body mass index is 30.01 kg/m.  Interventions:       Diet:  Heart healthy/carb modified DVT Prophylaxis: Subcutaneous Lovenox   Advance goals of care discussion: Full code  Family Communication: family was NOT present at bedside, at the time of interview.  The pt provided permission to discuss medical plan with the family. Opportunity was given to ask question and all questions were answered satisfactorily.   Disposition:  Pt is from Home, admitted with shortness of breath and severe cough, still has cough and shortness of breath, which precludes a safe discharge. Discharge to home, when when stable, most likely tomorrow a.m.  Subjective: No significant overnight events, patient still having cough episodes off and on sometimes severe, shortness of breath is getting better.  Patient denied any other activities, no chest pain palpitations, no abdominal pain, no nausea vomiting or diarrhea.  Physical Exam: General:  alert oriented to time, place, and person.  Appear in mild distress, affect appropriate Eyes: PERRLA ENT: Oral Mucosa Clear, moist  Neck: no JVD,  Cardiovascular: S1 and S2 Present, no Murmur,  Respiratory: good respiratory effort, Bilateral Air entry equal and Decreased, mild Crackles, mild wheezes Abdomen: Bowel Sound present, Soft and no tenderness,  Skin: no rashes Extremities: no Pedal edema, no calf tenderness Neurologic: without any new focal findings Gait not checked due to patient safety concerns  Vitals:   05/04/21 0449 05/04/21 0730 05/04/21 0733 05/04/21 1153  BP: (!) 118/51 (!) 118/57  (!) 116/56  Pulse: (!) 54 60 60 61  Resp: 16 16 14 16   Temp: 97.6 F (36.4 C) 97.6 F (36.4 C)  97.6 F (36.4 C)  TempSrc:  Oral    SpO2: 100% 98% 100% 100%  Weight:      Height:        Intake/Output Summary (Last 24 hours) at 05/04/2021 1429 Last data filed at 05/04/2021 1000 Gross per 24 hour  Intake 1475 ml  Output 400 ml  Net 1075 ml   Filed Weights   05/02/21 1745 05/03/21 2027  Weight: 99.8 kg 100.4 kg    Data  Reviewed: I have personally reviewed and interpreted daily labs, tele strips, imagings as discussed above. I reviewed all nursing notes, pharmacy notes, vitals, pertinent old records I have discussed plan of care as described above with RN and patient/family.  CBC: Recent Labs  Lab 05/02/21 1746 05/04/21 0401  WBC 19.2* 9.9  HGB 11.2* 10.4*  HCT 35.1* 32.4*  MCV 83.8 83.3  PLT 201 209   Basic Metabolic Panel: Recent Labs  Lab 04/28/21 0823 05/02/21 1746 05/04/21 0401  NA 140 131* 135  K 4.5 3.7 4.1  CL 101 98 103  CO2 25 24 25   GLUCOSE 115* 285* 169*  BUN 13 21 20   CREATININE 1.15 2.03* 1.14  CALCIUM 9.2 8.2* 8.1*    Studies: No results found.  Scheduled Meds:  amLODipine  10 mg Oral Daily   aspirin  81 mg Oral Daily   atorvastatin  40 mg Oral Daily   azithromycin  250 mg Oral Daily   carvedilol  25 mg Oral BID   enoxaparin (LOVENOX) injection  40 mg Subcutaneous Q24H   insulin aspart  0-5 Units Subcutaneous QHS   insulin  aspart  0-9 Units Subcutaneous TID WC   isosorbide mononitrate  60 mg Oral Daily   methylPREDNISolone (SOLU-MEDROL) injection  40 mg Intravenous Q12H   pantoprazole  40 mg Oral BID   Continuous Infusions: PRN Meds: acetaminophen, albuterol, dextromethorphan-guaiFENesin, hydrALAZINE, loratadine, nitroGLYCERIN, ondansetron (ZOFRAN) IV  Time spent: 35 minutes  Author: Val Riles. MD [facility] 05/04/2021 2:29 PM  To reach On-call, see care teams to locate the attending and reach out to them via [email or web address]. If 7PM-7AM, please contact night-coverage If you still have difficulty reaching the attending provider, please page the [facility] (Director on Call) for [facility] on amion for assistance.

## 2021-05-05 DIAGNOSIS — A419 Sepsis, unspecified organism: Secondary | ICD-10-CM | POA: Diagnosis not present

## 2021-05-05 DIAGNOSIS — J209 Acute bronchitis, unspecified: Secondary | ICD-10-CM | POA: Diagnosis not present

## 2021-05-05 LAB — CBC
HCT: 34.9 % — ABNORMAL LOW (ref 39.0–52.0)
Hemoglobin: 11.5 g/dL — ABNORMAL LOW (ref 13.0–17.0)
MCH: 26.9 pg (ref 26.0–34.0)
MCHC: 33 g/dL (ref 30.0–36.0)
MCV: 81.7 fL (ref 80.0–100.0)
Platelets: 257 10*3/uL (ref 150–400)
RBC: 4.27 MIL/uL (ref 4.22–5.81)
RDW: 15.4 % (ref 11.5–15.5)
WBC: 15 10*3/uL — ABNORMAL HIGH (ref 4.0–10.5)
nRBC: 0 % (ref 0.0–0.2)

## 2021-05-05 LAB — BASIC METABOLIC PANEL
Anion gap: 7 (ref 5–15)
BUN: 21 mg/dL (ref 8–23)
CO2: 24 mmol/L (ref 22–32)
Calcium: 8.5 mg/dL — ABNORMAL LOW (ref 8.9–10.3)
Chloride: 105 mmol/L (ref 98–111)
Creatinine, Ser: 0.96 mg/dL (ref 0.61–1.24)
GFR, Estimated: >60 mL/min (ref >60)
Glucose, Bld: 180 mg/dL — ABNORMAL HIGH (ref 70–99)
Potassium: 4.3 mmol/L (ref 3.5–5.1)
Sodium: 136 mmol/L (ref 135–145)

## 2021-05-05 LAB — GLUCOSE, CAPILLARY
Glucose-Capillary: 166 mg/dL — ABNORMAL HIGH (ref 70–99)
Glucose-Capillary: 151 mg/dL — ABNORMAL HIGH (ref 70–99)

## 2021-05-05 LAB — MAGNESIUM: Magnesium: 2.5 mg/dL — ABNORMAL HIGH (ref 1.7–2.4)

## 2021-05-05 LAB — PHOSPHORUS: Phosphorus: 2.4 mg/dL — ABNORMAL LOW (ref 2.5–4.6)

## 2021-05-05 MED ORDER — PREDNISONE 50 MG PO TABS: ORAL_TABLET | ORAL | 0 refills | Status: DC

## 2021-05-05 NOTE — TOC Initial Note (Signed)
Transition of Care (TOC) - Initial/Assessment Note    Patient Details  Name: Dale Cook. MRN: [id number] Date of Birth: [birth year]  Transition of Care [facility]) CM/SW Contact:    Maree Krabbe, LCSW Phone Number: 05/05/2021, 2:05 PM  Clinical Narrative:   CSW spoke with pt and pt confirmed he has had HH in the past via [facility] and he would like to continue that. CSW will reach out to [facility].  [facility] will service. Notified MD will need orders.                Expected Discharge Plan: Home w Home Health Services Barriers to Discharge: No Barriers Identified   Patient Goals and CMS Choice Patient states their goals for this hospitalization and ongoing recovery are:: to go home   Choice offered to / list presented to : Patient  Expected Discharge Plan and Services Expected Discharge Plan: Home w Home Health Services     Post Acute Care Choice: Home Health Living arrangements for the past 2 months: Single Family Home Expected Discharge Date: 05/05/21                                    Prior Living Arrangements/Services Living arrangements for the past 2 months: Single Family Home Lives with:: Self Patient language and need for interpreter reviewed:: Yes        Need for Family Participation in Patient Care: Yes (Comment) Care giver support system in place?: Yes (comment)   Criminal Activity/Legal Involvement Pertinent to Current Situation/Hospitalization: No - Comment as needed  Activities of Daily Living Home Assistive Devices/Equipment: None ADL Screening (condition at time of admission) Patient's cognitive ability adequate to safely complete daily activities?: No Is the patient deaf or have difficulty hearing?: No Does the patient have difficulty seeing, even when wearing glasses/contacts?: No Does the patient have difficulty concentrating, remembering, or making decisions?: No Patient able to express need for assistance with ADLs?: No Does  the patient have difficulty dressing or bathing?: No Independently performs ADLs?: Yes (appropriate for developmental age) Does the patient have difficulty walking or climbing stairs?: No Weakness of Legs: None Weakness of Arms/Hands: None  Permission Sought/Granted Permission sought to share information with : Family Supports Permission granted to share information with : Yes, Release of Information Signed  Share Information with NAME: crystal  Permission granted to share info w AGENCY: [facility]  Permission granted to share info w Relationship: daughter     Emotional Assessment Appearance:: Appears stated age Attitude/Demeanor/Rapport: Engaged Affect (typically observed): Accepting Orientation: : Oriented to Place, Oriented to  Time, Oriented to Situation, Oriented to Self Alcohol / Substance Use: Not Applicable Psych Involvement: No (comment)  Admission diagnosis:  Acute bronchitis [J20.9] Elevated troponin [R77.8] AKI (acute kidney injury) (HCC) [N17.9] Subacute cough [R05.2] 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) [N17.9, N18.9] Patient Active Problem List   Diagnosis Date Noted   Chronic diastolic CHF (congestive heart failure) (HCC) 05/03/2021   CAD (coronary artery disease) 05/03/2021   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) 05/03/2021   Elevated troponin 05/03/2021   Hyperglycemia 05/03/2021   Severe sepsis (HCC) 05/03/2021   Acute bronchitis 05/03/2021   History of total knee arthroplasty, right 01/18/2021   Chest pain 12/07/2018   Hyperlipidemia with target low density lipoprotein (LDL) cholesterol less than 70 mg/dL 75/88/3254   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 [place] [address] Phone: [phone] Fax: [phone]  [facility] - [place], [place] - [address] [place] [address] Phone: [phone] Fax: [phone]     Social Determinants of Health (SDOH) Interventions    Readmission Risk Interventions No flowsheet data found.

## 2021-05-05 NOTE — Discharge Summary (Signed)
[facility] Discharge Summary   Patient: Dale DollarOtis Wilbert Flesch Jr. 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 Schedule an appointment as soon as possible for a visit in 2 days.   Specialty: Internal Medicine Contact information: [address]   [place] [id number] [phone]                Diet recommendation: Cardiac diet  Activity: The patient is advised to gradually reintroduce usual activities, as tolerated  Discharge Condition: stable  Code Status: Full code   History of present illness: As per the H and P dictated on admission Hospital Course:  significant of hypertension, hyperlipidemia, GERD, CAD, CABG, dCHF, CKD-3A, GERD, who presents with cough and shortness of breath. Patient states that he has been having cough for several weeks, which has worsened in the past several days.  He coughs up yellow sputum. His coughing is worsened at night.  Patient has mild shortness of breath, no fever or chills.  Patient does not have chest pain on resting, but had some chest pain on coughing earlier, which has resolved.  Currently no active chest pain.  Patient states that occasionally he has wheezing.  No nausea, vomiting, diarrhea or abdominal pain no symptoms of UTI.  No leg edema. ED Course: pt was  found to have WBC 19.2, troponin level 28, 25, BNP 218, negative COVID PCR, worsening renal function, temperature normal, blood pressure 115/65, heart rate 94, RR 32, oxygen saturation 95% on room air.  Chest x-ray negative.  CT angiogram is negative for PE, but showed cardiomegaly and emphysema.  Patient is admitted to progressive bed as inpatient.   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  Chest x-ray negative.  Potential differential diagnosis include acute bronchitis versus upper respiratory viral infection.  Patient has leukocytosis with WBC 19.2, heart rate 94, RR 33, meets criteria for severe sepsis, lactic acid 2.5--1.8 trended down to normal. Continue bronchodilators. S/p Solu-Medrol 40 mg IV bid, transition to oral prednisone 50 mg daily for 3 days. S/p Azithromycin orally, patient was already taking azithromycin which was resumed to complete the course. S/p Mucinex for cough. blood culture x2 NGTD, RVP negative. Procalcitonin negative and lactic acid trended down s/p fluid.  Vital signs remained stable, patient's cough improved.  Patient was feeling to be discharged home. # 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: Baseline creatinine 1.1-1.3, his creatinine is at 2.03, BUN 21, likely due to dehydration and continuation of Lasix, Mobic and moexipril. held Lasix, Mobic and moexipril during hospital stay.  AKI resolved, renal function improved after IV  fluid.  Creatinine 0.96 today.  Patient was advised to continue oral hydration and follow with PCP. # Essential hypertension, amlodipine and Imdur continued, blood pressure remained stable.  Held lasix and Moexipril due to worsening renal function during hospital stay, renal function improved and resumed all home medications on discharge  # Hyperlipidemia with target low density lipoprotein (LDL) cholesterol less than 70 mg/dL Continued Lipitor,  LDL 60  Chest pain, elevated troponin and CAD: s/p of CABG. Patient chest pain has resolved, currently no active chest pain.  Troponin level 28, 25, 21 minimally elevated and trended down, likely due to demand ischemia. Contine Aspirin, Lipitor, Imdur, coreg.   # Chronic diastolic CHF (congestive heart failure): 2D echo on 10/15/2020 showed EF of 60 to 65% with grade 1 diastolic dysfunction.  Patient has mildly elevated BNP 218, but no leg edema or JVD.  No pulm edema chest x-ray.  CHF seem to be compensated. Held Lasix due to worsening renal function, resumed on discharge # Hyperglycemia: Patient admitted for prediabetes.  Blood sugar 285 today.  Recent A1c 6.4 on 01/11/2017. S/p sliding scale insulin.  A1c 6.6 well-controlled.  Continue diabetic diet and follow with PCP. GERD: Increase Protonix from 40 mg daily to twice daily since patient has worsening cough at night,  Resumed home dose on discharge Body mass index is 30.01 kg/m.  Interventions:     On the day of the discharge the patient's vitals were stable, and no other acute medical condition were reported by patient. the patient was felt safe to be discharge at Home with Home health.  Consultants: None Procedures: none  Discharge Exam: General: Appear in no distress, no Rash; Oral Mucosa Clear, moist. Cardiovascular: S1 and S2 Present, no Murmur, Respiratory: normal respiratory effort, Bilateral Air entry present and no Crackles, o wheezes Abdomen: Bowel Sound present, Soft and no tenderness,  no hernia Extremities: no Pedal edema, no calf tenderness Neurology: alert and oriented to time, place, and person affect appropriate.  Filed Weights   05/02/21 1745 05/03/21 2027  Weight: 99.8 kg 100.4 kg   Vitals:   05/05/21 0800 05/05/21 1153  BP: 137/65 140/64  Pulse: 60 60  Resp: 19 19  Temp: 98.4 F (36.9 C) 98 F (36.7 C)  SpO2: 100% 97%    DISCHARGE MEDICATION: Allergies as of 05/05/2021   No Known Allergies      Medication List     TAKE these medications    amLODipine 10 MG tablet Commonly known as: NORVASC TAKE 1 TABLET EVERY DAY   aspirin 81 MG chewable tablet Chew 1 tablet (81 mg total) by mouth 2 (two) times daily. What changed: when to take this   atorvastatin 40 MG tablet Commonly known as: LIPITOR Take 40 mg by mouth daily.   azithromycin 250 MG tablet Commonly known as: ZITHROMAX Take 1 a day for 4 days   carvedilol 25 MG tablet Commonly known as: COREG TAKE 1 TABLET TWICE DAILY   dextromethorphan-guaiFENesin 30-600 MG 12hr tablet Commonly known as: MUCINEX DM Take 1 tablet by mouth 2 (two) times daily as needed.   furosemide 20 MG tablet Commonly known as: LASIX Take 1 tablet (20 mg total)  by mouth daily.   isosorbide mononitrate 60 MG 24 hr tablet Commonly known as: IMDUR Take 1 tablet (60 mg total) by mouth daily. PLEASE SCHEDULE OFFICE VISIT FOR REFILLS. THANK YOU!   loratadine 10 MG tablet Commonly known as: CLARITIN Take 10 mg by mouth daily as needed for allergies.   meloxicam 15 MG tablet Commonly known as: MOBIC Take 15 mg by mouth at bedtime.   moexipril 15 MG tablet Commonly known as: UNIVASC Take 15 mg by mouth at bedtime.   pantoprazole 40 MG tablet Commonly known as: PROTONIX TAKE 1 TABLET EVERY DAY   predniSONE 50 MG tablet Commonly known as: DELTASONE Take with food       No Known Allergies Discharge Instructions     Call MD for:  difficulty breathing, headache or visual disturbances   Complete  by: As directed    Call MD for:  extreme fatigue   Complete by: As directed    Call MD for:  persistant nausea and vomiting   Complete by: As directed    Call MD for:  severe uncontrolled pain   Complete by: As directed    Call MD for:  temperature >100.4   Complete by: As directed    Diet - low sodium heart healthy   Complete by: As directed    Discharge instructions   Complete by: As directed    Follow-up with PCP in 1 week   Increase activity slowly   Complete by: As directed        The results of significant diagnostics from this hospitalization (including imaging, microbiology, ancillary and laboratory) are listed below for reference.    Significant Diagnostic Studies: DG Chest 2 View  Result Date: 05/02/2021 CLINICAL DATA:  Cough and short of breath.  Chest pain EXAM: CHEST - 2 VIEW COMPARISON:  09/19/2020 FINDINGS: Postop CABG. Heart size mildly enlarged. Negative for heart failure. Lungs clear without infiltrate or effusion. IMPRESSION: No active cardiopulmonary disease. Electronically Signed   By: Franchot Gallo M.D.   On: 05/02/2021 18:24   CT Angio Chest PE W and/or Wo Contrast  Result Date: 05/03/2021 CLINICAL DATA:  Cough, chest pain, shortness of breath. High probability suspicion for PE. EXAM: CT ANGIOGRAPHY CHEST WITH CONTRAST TECHNIQUE: Multidetector CT imaging of the chest was performed using the standard protocol during bolus administration of intravenous contrast. Multiplanar CT image reconstructions and MIPs were obtained to evaluate the vascular anatomy. CONTRAST:  1mL OMNIPAQUE IOHEXOL 350 MG/ML SOLN COMPARISON:  CT scan of the chest 09/19/2020 FINDINGS: Cardiovascular: Satisfactory opacification of the pulmonary arteries to the segmental level. No evidence of pulmonary embolism. The heart is at the upper limits of normal for size. Extensive calcifications throughout the native coronary arteries. Patient is status post median sternotomy for CABG. No pericardial  effusion. Mediastinum/Nodes: No enlarged mediastinal, hilar, or axillary lymph nodes. Thyroid gland, trachea, and esophagus demonstrate no significant findings. Lungs/Pleura: Mild paraseptal pulmonary emphysema. Moderate respiratory motion artifact. No suspicious pulmonary mass or nodule. No evidence of pulmonary edema, pleural effusion or pneumothorax. Mild dependent atelectasis. Upper Abdomen: No acute abnormality. Musculoskeletal: No chest wall abnormality. No acute or significant osseous findings. Review of the MIP images confirms the above findings. IMPRESSION: 1. No evidence of acute pulmonary embolus, pneumonia or other acute cardiopulmonary process. 2. Borderline cardiomegaly. 3. Native coronary artery disease with surgical changes of prior CABG. 4. Mild paraseptal pulmonary emphysema. 5. Mild dependent atelectasis. Aortic Atherosclerosis (ICD10-I70.0) and Emphysema (ICD10-J43.9). Electronically Signed   By: Myrle Sheng  Archer Asa M.D.   On: 05/03/2021 05:49    Microbiology: Recent Results (from the past 240 hour(s))  Resp Panel by RT-PCR (Flu A&B, Covid) Nasopharyngeal Swab     Status: None   Collection Time: 05/03/21  1:24 AM   Specimen: Nasopharyngeal Swab; Nasopharyngeal(NP) swabs in vial transport medium  Result Value Ref Range Status   SARS Coronavirus 2 by RT PCR NEGATIVE NEGATIVE Final    Comment: (NOTE) SARS-CoV-2 target nucleic acids are NOT DETECTED.  The SARS-CoV-2 RNA is generally detectable in upper respiratory specimens during the acute phase of infection. The lowest concentration of SARS-CoV-2 viral copies this assay can detect is 138 copies/mL. A negative result does not preclude SARS-Cov-2 infection and should not be used as the sole basis for treatment or other patient management decisions. A negative result may occur with  improper specimen collection/handling, submission of specimen other than nasopharyngeal swab, presence of viral mutation(s) within the areas targeted by  this assay, and inadequate number of viral copies(<138 copies/mL). A negative result must be combined with clinical observations, patient history, and epidemiological information. The expected result is Negative.  Fact Sheet for Patients:  BloggerCourse.com  Fact Sheet for Healthcare Providers:  SeriousBroker.it  This test is no t yet approved or cleared by the [place] FDA and  has been authorized for detection and/or diagnosis of SARS-CoV-2 by FDA under an Emergency Use Authorization (EUA). This EUA will remain  in effect (meaning this test can be used) for the duration of the COVID-19 declaration under Section 564(b)(1) of the Act, 21 U.S.C.section 360bbb-3(b)(1), unless the authorization is terminated  or revoked sooner.       Influenza A by PCR NEGATIVE NEGATIVE Final   Influenza B by PCR NEGATIVE NEGATIVE Final    Comment: (NOTE) The Xpert Xpress SARS-CoV-2/FLU/RSV plus assay is intended as an aid in the diagnosis of influenza from Nasopharyngeal swab specimens and should not be used as a sole basis for treatment. Nasal washings and aspirates are unacceptable for Xpert Xpress SARS-CoV-2/FLU/RSV testing.  Fact Sheet for Patients: BloggerCourse.com  Fact Sheet for Healthcare Providers: SeriousBroker.it  This test is not yet approved or cleared by the [place] FDA and has been authorized for detection and/or diagnosis of SARS-CoV-2 by FDA under an Emergency Use Authorization (EUA). This EUA will remain in effect (meaning this test can be used) for the duration of the COVID-19 declaration under Section 564(b)(1) of the Act, 21 U.S.C. section 360bbb-3(b)(1), unless the authorization is terminated or revoked.  Performed at [facility], [address] [place], [place] [address]   CULTURE, BLOOD (ROUTINE X 2) w Reflex to ID Panel     Status: None  (Preliminary result)   Collection Time: 05/03/21 10:15 AM   Specimen: BLOOD  Result Value Ref Range Status   Specimen Description BLOOD BLOOD LEFT HAND  Final   Special Requests   Final    BOTTLES DRAWN AEROBIC AND ANAEROBIC Blood Culture results may not be optimal due to an inadequate volume of blood received in culture bottles   Culture   Final    NO GROWTH 2 DAYS Performed at [facility], [address] [place], [place] [address]    Report Status PENDING  Incomplete  CULTURE, BLOOD (ROUTINE X 2) w Reflex to ID Panel     Status: None (Preliminary result)   Collection Time: 05/03/21 10:15 AM   Specimen: BLOOD  Result Value Ref Range Status   Specimen Description BLOOD BLOOD LEFT FOREARM  Final   Special Requests   Final    BOTTLES DRAWN AEROBIC AND ANAEROBIC Blood Culture results may not be optimal due to an inadequate volume of blood received in culture bottles   Culture   Final    NO GROWTH 2 DAYS Performed at [facility], [address] [place], [place] [address]    Report Status PENDING  Incomplete  Respiratory (~20 pathogens) panel by PCR     Status: None   Collection Time: 05/03/21 11:15 AM   Specimen: Nasopharyngeal Swab; Respiratory  Result Value Ref Range Status   Adenovirus NOT DETECTED NOT DETECTED Final   Coronavirus 229E NOT DETECTED NOT DETECTED Final    Comment: (NOTE) The Coronavirus on the Respiratory Panel, DOES NOT test for the novel  Coronavirus (2019 nCoV)    Coronavirus HKU1 NOT DETECTED NOT DETECTED Final   Coronavirus NL63 NOT DETECTED NOT DETECTED Final   Coronavirus OC43 NOT DETECTED NOT DETECTED Final   Metapneumovirus NOT DETECTED NOT DETECTED Final   Rhinovirus / Enterovirus NOT DETECTED NOT DETECTED Final   Influenza A NOT DETECTED NOT DETECTED Final   Influenza B NOT DETECTED NOT DETECTED Final   Parainfluenza Virus 1 NOT DETECTED NOT DETECTED Final   Parainfluenza Virus 2 NOT DETECTED NOT DETECTED Final    Parainfluenza Virus 3 NOT DETECTED NOT DETECTED Final   Parainfluenza Virus 4 NOT DETECTED NOT DETECTED Final   Respiratory Syncytial Virus NOT DETECTED NOT DETECTED Final   Bordetella pertussis NOT DETECTED NOT DETECTED Final   Bordetella Parapertussis NOT DETECTED NOT DETECTED Final   Chlamydophila pneumoniae NOT DETECTED NOT DETECTED Final   Mycoplasma pneumoniae NOT DETECTED NOT DETECTED Final    Comment: Performed at [facility], [place]. [address], [place], [place] [address]  Expectorated Sputum Assessment w Gram Stain, Rflx to Resp Cult     Status: None   Collection Time: 05/03/21 12:19 PM   Specimen: Expectorated Sputum  Result Value Ref Range Status   Specimen Description EXPECTORATED SPUTUM  Final   Special Requests NONE  Final   Sputum evaluation   Final    THIS SPECIMEN IS ACCEPTABLE FOR SPUTUM CULTURE Performed at [facility], [address], [place], [place] [address]    Report Status 05/03/2021 FINAL  Final  Culture, Respiratory w Gram Stain     Status: None (Preliminary result)   Collection Time: 05/03/21 12:19 PM  Result Value Ref Range Status   Specimen Description   Final    EXPECTORATED SPUTUM Performed at [facility], [address] [place], [place] [address]    Special Requests   Final    NONE Reflexed from [id number] Performed at [facility], [address] [place], [place] [address]    Gram Stain   Final    FEW SQUAMOUS EPITHELIAL CELLS PRESENT ABUNDANT WBC PRESENT,BOTH PMN AND MONONUCLEAR ABUNDANT GRAM POSITIVE COCCI FEW GRAM NEGATIVE RODS FEW GRAM POSITIVE RODS    Culture   Final    FEW Normal respiratory flora-no Staph aureus or Pseudomonas seen Performed at [facility], [place] [address] [place] [address] [place], [place] [address]    Report Status PENDING  Incomplete     Labs: CBC: Recent Labs  Lab 05/02/21 1746 05/04/21 0401 05/05/21 1119  WBC 19.2* 9.9 15.0*  HGB 11.2* 10.4* 11.5*  HCT 35.1* 32.4* 34.9*  MCV  83.8 83.3 81.7  PLT 201 209 [id number]   Basic Metabolic Panel: Recent Labs  Lab 05/02/21 1746 05/04/21 0401  05/05/21 1119  NA 131* 135 136  K 3.7 4.1 4.3  CL 98 103 105  CO2 24 25 24   GLUCOSE 285* 169* 180*  BUN 21 20 21   CREATININE 2.03* 1.14 0.96  CALCIUM 8.2* 8.1* 8.5*  MG  --   --  2.5*  PHOS  --   --  2.4*   Liver Function Tests: Recent Labs  Lab 05/02/21 1746  AST 22  ALT 14  ALKPHOS 93  BILITOT 0.9  PROT 7.3  ALBUMIN 3.1*   No results for input(s): LIPASE, AMYLASE in the last 168 hours. No results for input(s): AMMONIA in the last 168 hours. Cardiac Enzymes: No results for input(s): CKTOTAL, CKMB, CKMBINDEX, TROPONINI in the last 168 hours. BNP (last 3 results) Recent Labs    09/19/20 2017 05/02/21 2200  BNP 211.7* 218.6*   CBG: Recent Labs  Lab 05/04/21 1151 05/04/21 1531 05/04/21 2033 05/05/21 0758 05/05/21 1152  GLUCAP 141* 174* 165* 166* 151*    Time spent: 35 minutes  Signed:  Val Riles  [facility]  05/05/2021 1:58 PM

## 2021-05-05 NOTE — Progress Notes (Signed)
Dale Cook. to be D/C'd home per MD order. Discussed with the patient and all questions fully answered.  Skin clean, dry and intact without evidence of skin break down, no evidence of skin tears noted.  IV catheter discontinued intact. Site without signs and symptoms of complications. Dressing and pressure applied.  An After Visit Summary was printed and given to the patient.  Patient escorted via WC, and D/C home via private auto.  Jon Gills  05/05/2021 2:48 PM

## 2021-05-06 LAB — CULTURE, RESPIRATORY W GRAM STAIN: Culture: NORMAL

## 2021-05-08 LAB — CULTURE, BLOOD (ROUTINE X 2)
Culture: NO GROWTH
Culture: NO GROWTH

## 2021-06-04 ENCOUNTER — Other Ambulatory Visit: Payer: Self-pay | Admitting: Cardiovascular Disease

## 2021-06-04 MED ORDER — PANTOPRAZOLE SODIUM 40 MG PO TBEC: 40.0000 mg | DELAYED_RELEASE_TABLET | Freq: Every day | ORAL | 3 refills | Status: DC

## 2021-06-04 NOTE — Addendum Note (Signed)
Addended by: Bryna Colander on: 06/04/2021 04:06 PM   Modules accepted: Orders

## 2021-06-09 ENCOUNTER — Other Ambulatory Visit: Payer: Self-pay | Admitting: Family

## 2021-06-09 DIAGNOSIS — I5032 Chronic diastolic (congestive) heart failure: Secondary | ICD-10-CM

## 2021-07-09 ENCOUNTER — Encounter: Payer: Self-pay | Admitting: Physician Assistant

## 2021-07-09 ENCOUNTER — Other Ambulatory Visit: Payer: Self-pay

## 2021-07-09 ENCOUNTER — Ambulatory Visit: Payer: Medicare HMO | Admitting: Physician Assistant

## 2021-07-09 VITALS — BP 148/70 | HR 73 | Ht 72.0 in | Wt 229.0 lb

## 2021-07-09 DIAGNOSIS — I10 Essential (primary) hypertension: Secondary | ICD-10-CM

## 2021-07-09 DIAGNOSIS — R079 Chest pain, unspecified: Secondary | ICD-10-CM

## 2021-07-09 DIAGNOSIS — E785 Hyperlipidemia, unspecified: Secondary | ICD-10-CM | POA: Diagnosis not present

## 2021-07-09 DIAGNOSIS — R011 Cardiac murmur, unspecified: Secondary | ICD-10-CM | POA: Diagnosis not present

## 2021-07-09 MED ORDER — ISOSORBIDE MONONITRATE ER 60 MG PO TB24: 60.0000 mg | ORAL_TABLET | Freq: Every day | ORAL | 3 refills | Status: DC

## 2021-07-09 NOTE — Patient Instructions (Addendum)
Medication Instructions:  No changes at this time.  *If you need a refill on your cardiac medications before your next appointment, please call your pharmacy*   Lab Work: None  If you have labs (blood work) drawn today and your tests are completely normal, you will receive your results only by: [place] (if you have MyChart) OR A paper copy in the mail If you have any lab test that is abnormal or we need to change your treatment, we will call you to review the results.   Testing/Procedures: [place]  Your caregiver has ordered a Stress Test with nuclear imaging. The purpose of this test is to evaluate the blood supply to your heart muscle. This procedure is referred to as a "Non-Invasive Stress Test." This is because other than having an IV started in your vein, nothing is inserted or "invades" your body. Cardiac stress tests are done to find areas of poor blood flow to the heart by determining the extent of coronary artery disease (CAD). Some patients exercise on a treadmill, which naturally increases the blood flow to your heart, while others who are  unable to walk on a treadmill due to physical limitations have a pharmacologic/chemical stress agent called Lexiscan . This medicine will mimic walking on a treadmill by temporarily increasing your coronary blood flow.   Please note: these test may take anywhere between 2-4 hours to complete  PLEASE REPORT TO [place] AT THE FIRST DESK WILL DIRECT YOU WHERE TO GO  Date of Procedure:________________________  Arrival Time for Procedure:______________________________  Instructions regarding medication:   __XX__ : Hold diabetes medication morning of procedure   PLEASE NOTIFY THE OFFICE AT LEAST 24 HOURS IN ADVANCE IF YOU ARE UNABLE TO KEEP YOUR APPOINTMENT.  [phone] AND  PLEASE NOTIFY NUCLEAR MEDICINE AT [facility] AT LEAST 24 HOURS IN ADVANCE IF YOU ARE UNABLE TO KEEP YOUR APPOINTMENT.  [phone]  How to prepare for your Myoview test:  Do not eat or drink after midnight No caffeine for 24 hours prior to test No smoking 24 hours prior to test. Your medication may be taken with water.  If your doctor stopped a medication because of this test, do not take that medication. Ladies, please do not wear dresses.  Skirts or pants are appropriate. Please wear a short sleeve shirt. No perfume, cologne or lotion. Wear comfortable walking shoes. No heels!    Follow-Up: At [facility], you and your health needs are our priority.  As part of our continuing mission to provide you with exceptional heart care, we have created designated Provider Care Teams.  These Care Teams include your primary Cardiologist (physician) and Advanced Practice Providers (APPs -  Physician Assistants and Nurse Practitioners) who all work together to provide you with the care you need, when you need it.  We recommend signing up for the patient portal called "MyChart".  Sign up information is provided on this After Visit Summary.  MyChart is used to connect with patients for Virtual Visits (Telemedicine).  Patients are able to view lab/test results, encounter notes, upcoming appointments, etc.  Non-urgent messages can be sent to your provider as well.   To learn more about what you can do with MyChart, go to [email or web address].    Your next appointment:   1 month(s)  The format for your next appointment:   In Person  Provider:   Kathlyn Sacramento, MD or Christell Faith, PA-C

## 2021-07-09 NOTE — Progress Notes (Signed)
Office Visit    Patient Name: Dale Cook. Date of Encounter: 07/09/2021  PCP:  Albina Billet, MD   [place]  Cardiologist:  Kathlyn Sacramento, MD  Advanced Practice Provider:  No care team member to display Electrophysiologist:  None    HPI:     Dale Cook. is a 76 y.o. male with a hx of CAD s/p CABG in 12/2016, HFpEF, HTN, and HLD last seen 04/28/2021 by Christell Faith, PA-C.   He presented in 2018 with unstable angina with LHC showing severe three-vessel CAD.  He subsequently underwent four-vessel CABG in 12/2016 with LIMA to mid LAD, SVG to distal LAD, jump graft with SVG to OM1 and OM 2.  Carotid ultrasound prior to surgery showed no significant disease.  Echocardiogram showed normal LV systolic function with no significant valvular abnormalities.  He was then admitted 11/2018 with chest pain in the setting of hypertensive urgency and ruled out for MI.  Lexiscan was negative for significant ischemia.  He was seen in the ED in 08/2020 with a several month history of cough with mildly elevated BNP at 211.  COVID was negative.  He was treated with a short course of Lasix.  Echocardiogram April 2022 showed normal LV systolic function grade 1 DD, normal pulmonary pressure, mild MR, and mild AI.  When he was seen in our office May 2022 his furosemide had been resumed due to dyspnea and lower extremity swelling.  He reported feeling better during this visit.  He was evaluated in the [facility] ED in 01/2021 with lower extremity edema involving the right foot with increased pain with weightbearing.  Ultrasound negative for DVT.  He then followed up in our office 04/28/2021 doing well from a cardiac perspective.  His weight was down 11 pounds since his last clinic visit.  He was walking on a daily basis without cardiac limitation.  He then presented to the ED a few days later on 05/05/2021 with several weeks of cough and shortness of breath.  He did have yellow sputum.   He was diagnosed with acute bronchitis versus URI.  BC negative x2.  Troponin was drawn which was minimally elevated 28, 25, 21.  Thought to be due to demand ischemia.  He was stable from a heart failure standpoint with no lower extremity edema or JVD.  His Lasix was held while in the hospital but restarted at discharge.  Today, he is having very classic cardiac chest pain with exertion.  His pain goes away with rest.  He also has shortness of breath with exertion which also subsides with rest.  Back when he had his CABG in 2018 he had the same symptoms.  We discussed a few options Myoview Lexi scan versus coronary CT.  He has to have another procedure done but would like to know if he has coronary disease before going forward with that procedure.  We reviewed his EKG which was stable.  His blood pressure was slightly elevated today 148/70.  He states that at home his blood pressure is in the Q000111Q systolic.  He has been compliant with all his medications and does not need any refills today.  He does have some swelling in his legs after being up with activity but if he wears his compression socks he does not have any swelling.  He denies any dizziness or lightheadedness.  He denies syncope or presyncope.  He did have a recent lipid panel in November 2022  which showed total cholesterol 90, LDL 60, HDL 19, and triglycerides 56.   No edema, orthopnea, PND. Reports no palpitations.     Past Medical History    Past Medical History:  Diagnosis Date   Arthritis    "hands, knees" (01/09/2017)   CAD (coronary artery disease)    a. 12/2016 Cath: LM nl, LAD 95p, 10m, 60/90d, LCX 80p/m, 169m, OM1 63m, OM2 90, RCA 99p, Ef 55-65%; b. 12/2016 CABG x 4 (LIMA->mLAD, VG->dLAD, VG->OM1->OM2); c. 11/2018 MV: EF 66%, no ischemia.   Cardiac murmur    Grade II/VI early peaking systolic in aortic area   CHF (congestive heart failure) (HCC)    Essential hypertension    GERD (gastroesophageal reflux disease)    High  cholesterol    History of echocardiogram    a. 12/2016 Echo: EF 65-70%.   Left carotid bruit    Wears dentures    partial upper   Past Surgical History:  Procedure Laterality Date   CATARACT EXTRACTION W/PHACO Left 06/12/2020   Procedure: CATARACT EXTRACTION PHACO AND INTRAOCULAR LENS PLACEMENT (IOC) LEFT 4.36 00:51.9 8.4%;  Surgeon: Leandrew Koyanagi, MD;  Location: [place];  Service: Ophthalmology;  Laterality: Left;   COLONOSCOPY WITH PROPOFOL N/A 11/09/2016   Procedure: COLONOSCOPY WITH PROPOFOL;  Surgeon: Robert Bellow, MD;  Location: [facility];  Service: Endoscopy;  Laterality: N/A;   COLONOSCOPY WITH PROPOFOL N/A 11/29/2019   Procedure: COLONOSCOPY WITH PROPOFOL;  Surgeon: Robert Bellow, MD;  Location: [facility];  Service: Endoscopy;  Laterality: N/A;   CORONARY ARTERY BYPASS GRAFT N/A 01/12/2017   CABG x 4: LIMA to mLAD, SVG to dLAD, sequential SVG to OM1-OM2, EVH via left thigh and leg;  Surgeon: Rexene Alberts, MD;  Location: [place];  Service: Open Heart Surgery;  Laterality: N/A;   INTRAOPERATIVE TRANSESOPHAGEAL ECHOCARDIOGRAM N/A 01/12/2017   Procedure: INTRAOPERATIVE TRANSESOPHAGEAL ECHOCARDIOGRAM;  Surgeon: Rexene Alberts, MD;  Location: [place];  Service: Open Heart Surgery;  Laterality: N/A;   LEFT HEART CATH AND CORONARY ANGIOGRAPHY Left 01/09/2017   Procedure: Left Heart Cath and Coronary Angiography;  Surgeon: Wellington Hampshire, MD;  Location: [place] [facility];  Service: Cardiovascular;  Laterality: Left;   TOTAL KNEE ARTHROPLASTY Right 01/18/2021   Procedure: TOTAL KNEE ARTHROPLASTY;  Surgeon: Lovell Sheehan, MD;  Location: [facility];  Service: Orthopedics;  Laterality: Right;    Allergies  No Known Allergies  History of Present Illness    Dale Cook. is a 76 y.o. male with a hx of CAD s/p CABG in 12/2016, HFpEF, HTN, and HLD last seen 04/28/2021 by Christell Faith, PA-C.   He presented in 2018 with unstable angina with  LHC showing severe three-vessel CAD.  He subsequently underwent four-vessel CABG in 12/2016 with LIMA to mid LAD, SVG to distal LAD, jump graft with SVG to OM1 and OM 2.  Carotid ultrasound prior to surgery showed no significant disease.  Echocardiogram showed normal LV systolic function with no significant valvular abnormalities.  He was then admitted 11/2018 with chest pain in the setting of hypertensive urgency and ruled out for MI.  Lexiscan was negative for significant ischemia.  He was seen in the ED in 08/2020 with a several month history of cough with mildly elevated BNP at 211.  COVID was negative.  He was treated with a short course of Lasix.  Echocardiogram April 2022 showed normal LV systolic function grade 1 DD, normal pulmonary pressure, mild MR, and mild AI.  When he was seen in our office May 2022 his furosemide had been resumed due to dyspnea and lower extremity swelling.  He reported feeling better during this visit.  He was evaluated in the [facility] ED in 01/2021 with lower extremity edema involving the right foot with increased pain with weightbearing.  Ultrasound negative for DVT.  He then followed up in our office 04/28/2021 doing well from a cardiac perspective.  His weight was down 11 pounds since his last clinic visit.  He was walking on a daily basis without cardiac limitation.  He then presented to the ED a few days later on 05/05/2021 with several weeks of cough and shortness of breath.  He did have yellow sputum.  He was diagnosed with acute bronchitis versus URI.  BC negative x2.  Troponin was drawn which was minimally elevated 28, 25, 21.  Thought to be due to demand ischemia.  He was stable from a heart failure standpoint with no lower extremity edema or JVD.  His Lasix was held while in the hospital but restarted at discharge.  Today, he is having very classic cardiac chest pain with exertion.  His pain goes away with rest.  He also has shortness of breath with exertion which also subsides  with rest.  Back when he had his CABG in 2018 he had the same symptoms.  We discussed a few options Myoview Lexi scan versus coronary CT.  He has to have another procedure done but would like to know if he has coronary disease before going forward with that procedure.  We reviewed his EKG which was stable.  His blood pressure was slightly elevated today 148/70.  He states that at home his blood pressure is in the Q000111Q systolic.  He has been compliant with all his medications and does not need any refills today.  He does have some swelling in his legs after being up with activity but if he wears his compression socks he does not have any swelling.  He denies any dizziness or lightheadedness.  He denies syncope or presyncope.  He did have a recent lipid panel in November 2022 which showed total cholesterol 90, LDL 60, HDL 19, and triglycerides 56.   No edema, orthopnea, PND. Reports no palpitations.     EKGs/Labs/Other Studies Reviewed:    Echocardiogram 10/15/2020 IMPRESSIONS     1. Left ventricular ejection fraction, by estimation, is 60 to 65%. The  left ventricle has normal function. The left ventricle has no regional  wall motion abnormalities. Left ventricular diastolic parameters are  consistent with Grade I diastolic  dysfunction (impaired relaxation).   2. Right ventricular systolic function is normal. The right ventricular  size is normal. There is normal pulmonary artery systolic pressure. The  estimated right ventricular systolic pressure is 0000000 mmHg.   3. Left atrial size was mildly dilated.   4. The mitral valve is normal in structure. Mild mitral valve  regurgitation.   5. The aortic valve is normal in structure. Aortic valve regurgitation is  mild.   FINDINGS   Left Ventricle: Left ventricular ejection fraction, by estimation, is 60  to 65%. The left ventricle has normal function. The left ventricle has no  regional wall motion abnormalities. The left ventricular internal  cavity  size was normal in size. There is   no left ventricular hypertrophy. Left ventricular diastolic parameters  are consistent with Grade I diastolic dysfunction (impaired relaxation).   Right Ventricle: The right ventricular size is normal. No increase  in  right ventricular wall thickness. Right ventricular systolic function is  normal. There is normal pulmonary artery systolic pressure. The tricuspid  regurgitant velocity is 2.48 m/s, and   with an assumed right atrial pressure of 3 mmHg, the estimated right  ventricular systolic pressure is 0000000 mmHg.   Left Atrium: Left atrial size was mildly dilated.   Right Atrium: Right atrial size was normal in size.   Pericardium: There is no evidence of pericardial effusion.   Mitral Valve: The mitral valve is normal in structure. Mild mitral valve  regurgitation. No evidence of mitral valve stenosis.   Tricuspid Valve: The tricuspid valve is normal in structure. Tricuspid  valve regurgitation is mild . No evidence of tricuspid stenosis.   Aortic Valve: The aortic valve is normal in structure. Aortic valve  regurgitation is mild. Aortic regurgitation PHT measures 374 msec. No  aortic stenosis is present. Aortic valve mean gradient measures 8.0 mmHg.  Aortic valve peak gradient measures 14.0  mmHg. Aortic valve area, by VTI measures 1.18 cm.   Pulmonic Valve: The pulmonic valve was normal in structure. Pulmonic valve  regurgitation is not visualized. No evidence of pulmonic stenosis.   Aorta: The aortic root is normal in size and structure.   Venous: The inferior vena cava is normal in size with greater than 50%  respiratory variability, suggesting right atrial pressure of 3 mmHg.   IAS/Shunts: No atrial level shunt detected by color flow Doppler.     CT ANGIOGRAPHY CHEST WITH CONTRAST 05/03/2021     TECHNIQUE: Multidetector CT imaging of the chest was performed using the standard protocol during bolus administration of  intravenous contrast. Multiplanar CT image reconstructions and MIPs were obtained to evaluate the vascular anatomy.   CONTRAST:  38mL OMNIPAQUE IOHEXOL 350 MG/ML SOLN   COMPARISON:  CT scan of the chest 09/19/2020   FINDINGS: Cardiovascular: Satisfactory opacification of the pulmonary arteries to the segmental level. No evidence of pulmonary embolism. The heart is at the upper limits of normal for size. Extensive calcifications throughout the native coronary arteries. Patient is status post median sternotomy for CABG. No pericardial effusion.   Mediastinum/Nodes: No enlarged mediastinal, hilar, or axillary lymph nodes. Thyroid gland, trachea, and esophagus demonstrate no significant findings.   Lungs/Pleura: Mild paraseptal pulmonary emphysema. Moderate respiratory motion artifact. No suspicious pulmonary mass or nodule. No evidence of pulmonary edema, pleural effusion or pneumothorax. Mild dependent atelectasis.   Upper Abdomen: No acute abnormality.   Musculoskeletal: No chest wall abnormality. No acute or significant osseous findings.   Review of the MIP images confirms the above findings.   IMPRESSION: 1. No evidence of acute pulmonary embolus, pneumonia or other acute cardiopulmonary process. 2. Borderline cardiomegaly. 3. Native coronary artery disease with surgical changes of prior CABG. 4. Mild paraseptal pulmonary emphysema. 5. Mild dependent atelectasis.   Aortic Atherosclerosis (ICD10-I70.0) and Emphysema (ICD10-J43.9).  EKG:  EKG is  ordered today.  The ekg ordered today demonstrates normal sinus rhythm, rate 73 bpm, stable compared to previous EKGs.  Recent Labs: 05/02/2021: ALT 14; B Natriuretic Peptide 218.6 05/05/2021: BUN 21; Creatinine, Ser 0.96; Hemoglobin 11.5; Magnesium 2.5; Platelets 257; Potassium 4.3; Sodium 136  Recent Lipid Panel    Component Value Date/Time   CHOL 90 05/04/2021 0401   CHOL 111 04/28/2021 0823   TRIG 56 05/04/2021 0401    HDL 19 (L) 05/04/2021 0401   HDL 35 (L) 04/28/2021 0823   CHOLHDL 4.7 05/04/2021 0401   VLDL 11 05/04/2021 0401  LDLCALC 60 05/04/2021 0401   LDLCALC 60 04/28/2021 0823     Home Medications   Current Meds  Medication Sig   amLODipine (NORVASC) 10 MG tablet TAKE 1 TABLET EVERY DAY   aspirin 81 MG chewable tablet Chew 1 tablet (81 mg total) by mouth 2 (two) times daily. (Patient taking differently: Chew 81 mg by mouth daily.)   atorvastatin (LIPITOR) 40 MG tablet Take 40 mg by mouth daily.   carvedilol (COREG) 25 MG tablet TAKE 1 TABLET TWICE DAILY   dextromethorphan-guaiFENesin (MUCINEX DM) 30-600 MG 12hr tablet Take 1 tablet by mouth 2 (two) times daily as needed.   furosemide (LASIX) 20 MG tablet Take 1 tablet (20 mg total) by mouth daily.   isosorbide mononitrate (IMDUR) 60 MG 24 hr tablet Take 1 tablet (60 mg total) by mouth daily. PLEASE SCHEDULE OFFICE VISIT FOR REFILLS. THANK YOU!   loratadine (CLARITIN) 10 MG tablet Take 10 mg by mouth daily as needed for allergies.   meloxicam (MOBIC) 15 MG tablet Take 15 mg by mouth at bedtime.   moexipril (UNIVASC) 15 MG tablet Take 15 mg by mouth at bedtime.    pantoprazole (PROTONIX) 40 MG tablet Take 1 tablet (40 mg total) by mouth daily.   predniSONE (DELTASONE) 50 MG tablet Take with food     Review of Systems      All other systems reviewed and are otherwise negative except as noted above.  Physical Exam    VS:  BP (!) 148/70 (BP Location: Left Arm, Patient Position: Sitting, Cuff Size: Normal)    Pulse 73    Ht 6' (1.829 m)    Wt 229 lb (103.9 kg)    SpO2 97%    BMI 31.06 kg/m  , BMI Body mass index is 31.06 kg/m.  Wt Readings from Last 3 Encounters:  07/09/21 229 lb (103.9 kg)  05/03/21 221 lb 4.8 oz (100.4 kg)  04/28/21 220 lb 4 oz (99.9 kg)     GEN: Well nourished, well developed, in no acute distress. HEENT: normal. Neck: Supple, no JVD, carotid bruits, or masses. Cardiac: RRR, soft systolic murmur heard best along  the right sternal boarder, rubs, or gallops. No clubbing, cyanosis, edema.  Radials/PT 2+ and equal bilaterally.  Respiratory:  Respirations regular and unlabored, clear to auscultation bilaterally. GI: Soft, nontender, nondistended. MS: No deformity or atrophy. Skin: Warm and dry, no rash. Neuro:  Strength and sensation are intact. Psych: Normal affect.  Assessment & Plan    Hypertension -148/70, today which is higher than normal for him -at home its lower Q000111Q systolic  -He is encouraged to continue his current blood pressure regimen with Norvasc 10 mg, Coreg 25 mg, and Imdur 60 mg  CAD s/p CABG -Exertional anginal symptoms with associated shortness of breath -We discussed coronary CTA versus Lexiscan Myoview, and it was determined that since he had prior CABG he will be difficult to see the anastomosis with coronary CTA.  The patient was scheduled for [facility]. -Refill for Imdur sent today  Hyperlipidemia -Recent lipid panel 11/22 showed total cholesterol 90, LDL 60, HDL 19, triglycerides 56 -Continue Lipitor 40 mg daily  4. Soft systolic murmur -mild MR, mild AI, and mild TR on echo 09/2019 -Asymptomatic   Disposition: Follow up continue his current with Kathlyn Sacramento, MD or APP.  Signed, Elgie Collard, PA-C 07/09/2021, 3:26 PM [facility]

## 2021-07-13 ENCOUNTER — Ambulatory Visit
Admission: RE | Admit: 2021-07-13 | Discharge: 2021-07-13 | Disposition: A | Discharge status: Home / Self Care | Payer: Medicare HMO | Source: Ambulatory Visit | Attending: Physician Assistant | Admitting: Physician Assistant

## 2021-07-13 ENCOUNTER — Other Ambulatory Visit: Payer: Self-pay

## 2021-07-13 DIAGNOSIS — R079 Chest pain, unspecified: Secondary | ICD-10-CM | POA: Diagnosis present

## 2021-07-13 LAB — NM MYOCAR MULTI W/SPECT W/WALL MOTION / EF
LV dias vol: 96 mL (ref 62–150)
LV sys vol: 37 mL
MPHR: 147 {beats}/min
Nuc Stress EF: 61 %
Peak HR: 76 {beats}/min
Percent HR: 51 %
Rest HR: 65 {beats}/min
Rest Nuclear Isotope Dose: 10 mCi
SDS: 1
SRS: 5
SSS: 6
ST Depression (mm): 0 mm
Stress Nuclear Isotope Dose: 31.5 mCi
TID: 1.03

## 2021-07-13 MED ORDER — TECHNETIUM TC 99M TETROFOSMIN IV KIT
10.0400 | PACK | Freq: Once | INTRAVENOUS | Status: AC | PRN
  Administered 2021-07-13: 10.04 via INTRAVENOUS

## 2021-07-13 MED ORDER — REGADENOSON 0.4 MG/5ML IV SOLN
0.4000 mg | Freq: Once | INTRAVENOUS | Status: AC
  Administered 2021-07-13: 0.4 mg via INTRAVENOUS
  Filled 2021-07-13: qty 5

## 2021-07-13 MED ORDER — TECHNETIUM TC 99M TETROFOSMIN IV KIT
30.0000 | PACK | Freq: Once | INTRAVENOUS | Status: AC | PRN
  Administered 2021-07-13: 31.5 via INTRAVENOUS

## 2021-07-15 ENCOUNTER — Other Ambulatory Visit: Payer: Medicare HMO

## 2021-07-15 ENCOUNTER — Telehealth (HOSPITAL_BASED_OUTPATIENT_CLINIC_OR_DEPARTMENT_OTHER): Payer: Self-pay | Admitting: Physician Assistant

## 2021-07-15 NOTE — Telephone Encounter (Signed)
Telephone note  Attempted to call patient to discuss abnormal Myoview. Left message for the patient to call back. Case discussed with Dr. Fletcher Anon. If still with chest pain/dyspnea plan to proceed with cardiac cath.   Elgie Collard, PA-C

## 2021-07-15 NOTE — Telephone Encounter (Signed)
Able to reach pt regarding Mr.Munoz recent Myoview. Johann Capers, PA-C had a chance to review his results and advised   "Telephone note   Attempted to call patient to discuss abnormal Myoview. Left message for the patient to call back. Case discussed with Dr. Fletcher Anon. If still with chest pain/dyspnea plan to proceed with cardiac cath.    Elgie Collard, PA-C"  Mr. Laverne very thankful for the phone call of his results, still having increase SOBOE and intermittent CP, advised to come in for an appt to review results in person and discuss option for possible need for heart cath. Pt agrees to plan. Pt also reports has eye sx on 1/30, wants to know if okay to proceed with sx. Advised will need to discuss with provider at upcoming appt. Mr. Helmes verbalized understanding. Otherwise all questions and concerns were address with nothing further at this time. Will see at next schedule f/u appt on 1/25 at 10:05 am with Cadence Furth, PA-C.

## 2021-07-21 ENCOUNTER — Other Ambulatory Visit: Payer: Self-pay

## 2021-07-21 ENCOUNTER — Ambulatory Visit: Payer: Medicare HMO | Admitting: Medical

## 2021-07-21 ENCOUNTER — Encounter: Payer: Self-pay | Admitting: Medical

## 2021-07-21 VITALS — BP 110/80 | HR 78 | Ht 66.0 in | Wt 217.0 lb

## 2021-07-21 DIAGNOSIS — I10 Essential (primary) hypertension: Secondary | ICD-10-CM

## 2021-07-21 DIAGNOSIS — R079 Chest pain, unspecified: Secondary | ICD-10-CM | POA: Diagnosis not present

## 2021-07-21 DIAGNOSIS — R011 Cardiac murmur, unspecified: Secondary | ICD-10-CM | POA: Diagnosis not present

## 2021-07-21 DIAGNOSIS — I2510 Atherosclerotic heart disease of native coronary artery without angina pectoris: Secondary | ICD-10-CM | POA: Diagnosis not present

## 2021-07-21 NOTE — Patient Instructions (Signed)
Medication Instructions:  Your physician recommends that you continue on your current medications as directed. Please refer to the Current Medication list given to you today.   *If you need a refill on your cardiac medications before your next appointment, please call your pharmacy*   Lab Work:  Your provider has ordered lab work (BMET, CBC) to be drawn today before you leave.   If you have labs (blood work) drawn today and your tests are completely normal, you will receive your results only by: MyChart Message (if you have MyChart) OR A paper copy in the mail If you have any lab test that is abnormal or we need to change your treatment, we will call you to review the results.   Testing/Procedures:  [facility] CARDIOVASCULAR DIVISION [facility] [address] [place] [address] Dept: [phone] Loc: [phone]  Dale Cook.  07/21/2021  You are scheduled for a Cardiac Catheterization on Monday, January 30 with Dr. Lorine Cook.  1. Please arrive at the Medical Mall at [facility] [address] [place], [place], [place] [address] 8:30 AM (This time is two hours before your procedure to ensure your preparation). Free valet parking service is available.   Special note: Every effort is made to have your procedure done on time. Please understand that emergencies sometimes delay scheduled procedures.  2. Diet: Do not eat solid foods after midnight.  The patient may have clear liquids until 5am upon the day of the procedure.  3. Labs: You will need to have blood drawn on Wednesday, January 25   4. Medication instructions in preparation for your procedure:   Contrast Allergy: No  On the morning of your procedure, take your Aspirin and any morning medicines NOT listed above.  You may use sips of water.  5. Plan for one night stay--bring personal belongings. 6. Bring a current list of your  medications and current insurance cards. 7. You MUST have a responsible person to drive you home. 8. Someone MUST be with you the first 24 hours after you arrive home or your discharge will be delayed. 9. Please wear clothes that are easy to get on and off and wear slip-on shoes.  Thank you for allowing [place] to care for you!   -- [facility]    Follow-Up: At [facility], you and your health needs are our priority.  As part of our continuing mission to provide you with exceptional heart care, we have created designated Provider Care Teams.  These Care Teams include your primary Cardiologist (physician) and Advanced Practice Providers (APPs -  Physician Assistants and Nurse Practitioners) who all work together to provide you with the care you need, when you need it.  We recommend signing up for the patient portal called "MyChart".  Sign up information is provided on this After Visit Summary.  MyChart is used to connect with patients for Virtual Visits (Telemedicine).  Patients are able to view lab/test results, encounter notes, upcoming appointments, etc.  Non-urgent messages can be sent to your provider as well.   To learn more about what you can do with MyChart, go to [email or web address].    Your next appointment:   2-3 week(s)  The format for your next appointment:   In Person  Provider:   You may see Dale Bears, MD or one of the following Advanced Practice Providers on your designated Care Team:   Nicolasa Ducking, NP Eula Listen,  PA-C Cadence Fransico Michael, PA-C :1}    Other Instructions N/A

## 2021-07-21 NOTE — Progress Notes (Signed)
Cardiology Office Note:    Date:  07/21/2021   ID:  Dale Coon., DOB 07/20/1945, MRN [id number]  PCP:  Albina Billet, MD  [facility] Cardiologist:  Kathlyn Sacramento, MD  [facility] Electrophysiologist:  None   Referring MD: Albina Billet, MD   Chief Complaint: Abnormal Myoview  History of Present Illness:    Dale Allender. is a 76 y.o. male with a hx of with history of CAD status post CABG in 12/2016, HFpEF, HTN, and HLD who presents for follow-up of his CAD and HFpEF.   He presented in 2018 with unstable angina with LHC showing severe three-vessel CAD.  He subsequently underwent four-vessel CABG in 12/2016 with LIMA to mid LAD, SVG to distal LAD, jump graft with SVG to OM1 and OM 2.  Carotid artery ultrasound prior to surgery showed no significant disease.  Echo showed normal LV systolic function with no significant valvular abnormalities.  He was admitted in 11/2018 with chest pain in the setting of hypertensive urgency and ruled out for MI.  Lexiscan MPI was negative for significant ischemia.  He was seen in the ED in 08/2020 with a several month history of cough with a mildly elevated BNP at 211.  COVID was negative.  He was treated with a short course of Lasix.  Echo in 09/2020 showed normal LV systolic function, grade 1 diastolic dysfunction, normal pulmonary pressure, mild mitral regurgitation, and mild aortic insufficiency.  He was last seen in our office in 10/2020 with notation that furosemide 20 mg daily had been resumed by the CHF clinic given dyspnea and lower extremity swelling.  At his visit in our office, he reported feeling much better while on furosemide.  He was felt to be euvolemic.   He underwent right TKA in 12/2020.   He was evaluated in the [facility] ED on 02/20/2021 with lower extremity swelling involving the right foot with increased pain with weightbearing.  Right lower extremity ultrasound was negative for DVT.  He was discharged to outpatient follow-up  with PCP.  He then followed up in our office 04/28/2021 doing well from a cardiac perspective.  His weight was down 11 pounds since his last clinic visit.  He was walking on a daily basis without cardiac limitation.  He then presented to the ED a few days later on 05/05/2021 with several weeks of cough and shortness of breath.  He did have yellow sputum.  He was diagnosed with acute bronchitis versus URI.  BC negative x2.  Troponin was drawn which was minimally elevated 28, 25, 21.  Thought to be due to demand ischemia.  He was stable from a heart failure standpoint with no lower extremity edema or JVD.  His Lasix was held while in the hospital but restarted at discharge.  Last seen in the office 07/09/2021 reported chest pain with exertion.  A Lexiscan Myoview was ordered.  Test was abnormal, probably low risk pharmacological myocardial perfusion test was small in size, moderate to severe, partially reversible defect in the apical inferior and mid inferolateral segments most consistent with scar and peri-infarct ischemia, though an element of artifact cannot be excluded.  LVEF 60 to 65%.  Today,Myoview was abnormal. It was discussed in detail. The patient reports he has intermittent chest pain. It mainly occurs when he walks. Also has SOB with long walks. Patient scheduled for eye surgery for droopy left eye skin, 2 hour surgery. Also supposed to have left knee total replacement, will see  MD in march.  ° °Past Medical History:  °Diagnosis Date  ° Arthritis   ° "hands, knees" (01/09/2017)  ° CAD (coronary artery disease)   ° a. 12/2016 Cath: LM nl, LAD 95p, 60m, 60/90d, LCX 80p/m, 100m, OM1 60m, OM2 90, RCA 99p, Ef 55-65%; b. 12/2016 CABG x 4 (LIMA->mLAD, VG->dLAD, VG->OM1->OM2); c. 11/2018 MV: EF 66%, no ischemia.  ° Cardiac murmur   ° Grade II/VI early peaking systolic in aortic area  ° CHF (congestive heart failure) (HCC)   ° Essential hypertension   ° GERD (gastroesophageal reflux disease)   ° High cholesterol   °  History of echocardiogram   ° a. 12/2016 Echo: EF 65-70%.  ° Left carotid bruit   ° Wears dentures   ° partial upper  ° ° °Past Surgical History:  °Procedure Laterality Date  ° CATARACT EXTRACTION W/PHACO Left 06/12/2020  ° Procedure: CATARACT EXTRACTION PHACO AND INTRAOCULAR LENS PLACEMENT (IOC) LEFT 4.36 00:51.9 8.4%;  Surgeon: Brasington, Chadwick, MD;  Location: [facility];  Service: Ophthalmology;  Laterality: Left;  ° COLONOSCOPY WITH PROPOFOL N/A 11/09/2016  ° Procedure: COLONOSCOPY WITH PROPOFOL;  Surgeon: Byrnett, Jeffrey W, MD;  Location: [facility] ENDOSCOPY;  Service: Endoscopy;  Laterality: N/A;  ° COLONOSCOPY WITH PROPOFOL N/A 11/29/2019  ° Procedure: COLONOSCOPY WITH PROPOFOL;  Surgeon: Byrnett, Jeffrey W, MD;  Location: [facility] ENDOSCOPY;  Service: Endoscopy;  Laterality: N/A;  ° CORONARY ARTERY BYPASS GRAFT N/A 01/12/2017  ° CABG x 4: LIMA to mLAD, SVG to dLAD, sequential SVG to OM1-OM2, EVH via left thigh and leg;  Surgeon: Owen, Clarence H, MD;  Location: [facility] OR;  Service: Open Heart Surgery;  Laterality: N/A;  ° INTRAOPERATIVE TRANSESOPHAGEAL ECHOCARDIOGRAM N/A 01/12/2017  ° Procedure: INTRAOPERATIVE TRANSESOPHAGEAL ECHOCARDIOGRAM;  Surgeon: Owen, Clarence H, MD;  Location: [facility] OR;  Service: Open Heart Surgery;  Laterality: N/A;  ° LEFT HEART CATH AND CORONARY ANGIOGRAPHY Left 01/09/2017  ° Procedure: Left Heart Cath and Coronary Angiography;  Surgeon: Arida, Muhammad A, MD;  Location: [facility] INVASIVE CV LAB;  Service: Cardiovascular;  Laterality: Left;  ° TOTAL KNEE ARTHROPLASTY Right 01/18/2021  ° Procedure: TOTAL KNEE ARTHROPLASTY;  Surgeon: Bowers, James R, MD;  Location: [facility] ORS;  Service: Orthopedics;  Laterality: Right;  ° ° °Current Medications: °Current Meds  °Medication Sig  ° amLODipine (NORVASC) 10 MG tablet TAKE 1 TABLET EVERY DAY  ° aspirin 81 MG chewable tablet Chew 81 mg by mouth daily.  ° atorvastatin (LIPITOR) 40 MG tablet Take 40 mg by mouth daily.  ° carvedilol (COREG) 25 MG tablet  TAKE 1 TABLET TWICE DAILY  ° dextromethorphan-guaiFENesin (MUCINEX DM) 30-600 MG 12hr tablet Take 1 tablet by mouth 2 (two) times daily as needed.  ° furosemide (LASIX) 20 MG tablet Take 1 tablet (20 mg total) by mouth daily.  ° loratadine (CLARITIN) 10 MG tablet Take 10 mg by mouth daily as needed for allergies.  ° meloxicam (MOBIC) 15 MG tablet Take 15 mg by mouth at bedtime.  ° moexipril (UNIVASC) 15 MG tablet Take 15 mg by mouth at bedtime.   ° pantoprazole (PROTONIX) 40 MG tablet Take 1 tablet (40 mg total) by mouth daily.  ° predniSONE (DELTASONE) 50 MG tablet Take 50 mg by mouth daily with breakfast.  ° [DISCONTINUED] predniSONE (DELTASONE) 50 MG tablet Take with food (Patient taking differently: Take 50 mg by mouth daily with breakfast. Take with food)  °  ° °Allergies:   Patient has no known allergies.  ° °Social History  ° °  Socioeconomic History   Marital status: Divorced    Spouse name: Not on file   Number of children: 3   Years of education: Not on file   Highest education level: Not on file  Occupational History   Not on file  Tobacco Use   Smoking status: Never   Smokeless tobacco: Never  Vaping Use   Vaping Use: Never used  Substance and Sexual Activity   Alcohol use: No   Drug use: No   Sexual activity: Not Currently  Other Topics Concern   Not on file  Social History Narrative   Not on file   Social Determinants of Health   Financial Resource Strain: Not on file  Food Insecurity: Not on file  Transportation Needs: Not on file  Physical Activity: Sufficiently Active   Days of Exercise per Week: 7 days   Minutes of Exercise per Session: 120 min  Stress: Not on file  Social Connections: Not on file     Family History: The patient's family history includes Colon cancer in his brother; Heart attack in his mother.  ROS:   Please see the history of present illness.     All other systems reviewed and are negative.  EKGs/Labs/Other Studies Reviewed:    The  following studies were reviewed today:  Myoview Lexiscan 07/13/21   Abnormal, probably low risk pharmacologic myocardial perfusion stress test.   There is a small in size, moderate to severe, partially reversible defect involving the apical inferior and mid inferolateral segments most consistent with scar and peri-infarct ischemia, though an element of artifact cannot be excluded.   Left ventricular function is normal (LVEF 60-65%).   Attenuation correction CT demonstrates post CABG findings, coronary artery calcification, and aortic atherosclerosis.   Compared to the prior study of 12/07/2018, mid/apical inferior/inferolateral defect is new.    Echo 10/15/20  1. Left ventricular ejection fraction, by estimation, is 60 to 65%. The  left ventricle has normal function. The left ventricle has no regional  wall motion abnormalities. Left ventricular diastolic parameters are  consistent with Grade I diastolic  dysfunction (impaired relaxation).   2. Right ventricular systolic function is normal. The right ventricular  size is normal. There is normal pulmonary artery systolic pressure. The  estimated right ventricular systolic pressure is 0000000 mmHg.   3. Left atrial size was mildly dilated.   4. The mitral valve is normal in structure. Mild mitral valve  regurgitation.   5. The aortic valve is normal in structure. Aortic valve regurgitation is  mild.   Cardiac cath 12/2016 Prox Cx to Mid Cx lesion, 80 %stenosed. Ost 1st Mrg to 1st Mrg lesion, 60 %stenosed. Mid Cx lesion, 100 %stenosed. Ost LAD to Prox LAD lesion, 95 %stenosed. Mid LAD lesion, 60 %stenosed. Dist LAD-2 lesion, 60 %stenosed. Dist LAD-1 lesion, 90 %stenosed. Ost 2nd Mrg to 2nd Mrg lesion, 90 %stenosed. Prox RCA to Mid RCA lesion, 99 %stenosed. The left ventricular systolic function is normal. LV end diastolic pressure is normal. The left ventricular ejection fraction is 55-65% by visual estimate.   1. Significant  three-vessel coronary artery disease. Left dominant system. 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 2. Normal LV systolic function and high normal left ventricular end-diastolic pressure.   Recommendations: Overall difficult revascularization options given that the diffuse disease. PCI will require long segments of stenting especially the LAD. Also the left circumflex disease is a bifurcation stenosis involving 2 large OM branches. CABG is limited by significant distal LAD disease. Given  severity of disease, the patient will need to be hospitalized and started on a heparin drip 8 hours after sheath pull. I switched diltiazem to metoprolol. Continue atorvastatin. Transferr to [facility] for a heart team decision and consultation with CVTS. It might be possible to place a graft on both mid and distal LAD.  EKG:  EKG is not ordered today.    Recent Labs: 05/02/2021: ALT 14; B Natriuretic Peptide 218.6 05/05/2021: BUN 21; Creatinine, Ser 0.96; Hemoglobin 11.5; Magnesium 2.5; Platelets 257; Potassium 4.3; Sodium 136  Recent Lipid Panel    Component Value Date/Time   CHOL 90 05/04/2021 0401   CHOL 111 04/28/2021 0823   TRIG 56 05/04/2021 0401   HDL 19 (L) 05/04/2021 0401   HDL 35 (L) 04/28/2021 0823   CHOLHDL 4.7 05/04/2021 0401   VLDL 11 05/04/2021 0401   LDLCALC 60 05/04/2021 0401   LDLCALC 60 04/28/2021 0823    Physical Exam:    VS:  BP 110/80 (BP Location: Left Arm, Patient Position: Sitting, Cuff Size: Normal)    Pulse 78    Ht 5\' 6"  (1.676 m)    Wt 217 lb (98.4 kg)    SpO2 98%    BMI 35.02 kg/m     Wt Readings from Last 3 Encounters:  07/21/21 217 lb (98.4 kg)  07/09/21 229 lb (103.9 kg)  05/03/21 221 lb 4.8 oz (100.4 kg)     GEN:  Well nourished, well developed in no acute distress HEENT: Normal NECK: No JVD; No carotid bruits LYMPHATICS: No lymphadenopathy CARDIAC: RRR, no murmurs, rubs, gallops RESPIRATORY:  Clear to auscultation without rales, wheezing or rhonchi  ABDOMEN:  Soft, non-tender, non-distended MUSCULOSKELETAL:  No edema; No deformity  SKIN: Warm and dry NEUROLOGIC:  Alert and oriented x 3 PSYCHIATRIC:  Normal affect   ASSESSMENT:    1. Chest pain, unspecified type   2. Coronary artery disease involving native coronary artery of native heart without angina pectoris   3. Essential hypertension   4. Murmur    PLAN:    In order of problems listed above:  CAD s/p prior CABG in 2018 Patient had abnormal Myoview with new defect, normal EF (report above). Patient reports persistent exertional chest pain and shortness of breath. Was previously on Imdur, but this was stopped for good BP. Blood pressure today on the softer side. We will set him up for left cardiac catheterization. He reports he is needing elective eye surgery and elective L knee replacement, however this will have to wait, patient understands this. Continue Aspirin, Lipitor, and Coreg. We will see him back after cath.  HTN BP good. Continue current medication.   HLD LDL 60 04/2021. Continue statin.  Murmur Echo 09/2020 showed normal LVSF, G1DD, mild MR, mild AI.   Disposition: Follow up in 1 month with MD/APP   Shared Decision Making/Informed Consent   Shared Decision Making/Informed Consent The risks [stroke (1 in 1000), death (1 in 1000), kidney failure [usually temporary] (1 in 500), bleeding (1 in 200), allergic reaction [possibly serious] (1 in 200)], benefits (diagnostic support and management of coronary artery disease) and alternatives of a cardiac catheterization were discussed in detail with Dale Cook and he is willing to proceed.    Signed, Takeisha Cianci Ninfa Meeker, PA-C  07/21/2021 10:09 AM    [facility]

## 2021-07-21 NOTE — H&P (View-Only) (Signed)
Cardiology Office Note:    Date:  07/21/2021   ID:  Dale Coon., DOB November 30, 1945, MRN [id number]  PCP:  Albina Billet, MD  [facility] Cardiologist:  Kathlyn Sacramento, MD  [facility] Electrophysiologist:  None   Referring MD: Albina Billet, MD   Chief Complaint: Abnormal Myoview  History of Present Illness:    Dale Plona. is a 76 y.o. male with a hx of with history of CAD status post CABG in 12/2016, HFpEF, HTN, and HLD who presents for follow-up of his CAD and HFpEF.   He presented in 2018 with unstable angina with LHC showing severe three-vessel CAD.  He subsequently underwent four-vessel CABG in 12/2016 with LIMA to mid LAD, SVG to distal LAD, jump graft with SVG to OM1 and OM 2.  Carotid artery ultrasound prior to surgery showed no significant disease.  Echo showed normal LV systolic function with no significant valvular abnormalities.  He was admitted in 11/2018 with chest pain in the setting of hypertensive urgency and ruled out for MI.  Lexiscan MPI was negative for significant ischemia.  He was seen in the ED in 08/2020 with a several month history of cough with a mildly elevated BNP at 211.  COVID was negative.  He was treated with a short course of Lasix.  Echo in 09/2020 showed normal LV systolic function, grade 1 diastolic dysfunction, normal pulmonary pressure, mild mitral regurgitation, and mild aortic insufficiency.  He was last seen in our office in 10/2020 with notation that furosemide 20 mg daily had been resumed by the CHF clinic given dyspnea and lower extremity swelling.  At his visit in our office, he reported feeling much better while on furosemide.  He was felt to be euvolemic.   He underwent right TKA in 12/2020.   He was evaluated in the [facility] ED on 02/20/2021 with lower extremity swelling involving the right foot with increased pain with weightbearing.  Right lower extremity ultrasound was negative for DVT.  He was discharged to outpatient follow-up  with PCP.  He then followed up in our office 04/28/2021 doing well from a cardiac perspective.  His weight was down 11 pounds since his last clinic visit.  He was walking on a daily basis without cardiac limitation.  He then presented to the ED a few days later on 05/05/2021 with several weeks of cough and shortness of breath.  He did have yellow sputum.  He was diagnosed with acute bronchitis versus URI.  BC negative x2.  Troponin was drawn which was minimally elevated 28, 25, 21.  Thought to be due to demand ischemia.  He was stable from a heart failure standpoint with no lower extremity edema or JVD.  His Lasix was held while in the hospital but restarted at discharge.  Last seen in the office 07/09/2021 reported chest pain with exertion.  A Lexiscan Myoview was ordered.  Test was abnormal, probably low risk pharmacological myocardial perfusion test was small in size, moderate to severe, partially reversible defect in the apical inferior and mid inferolateral segments most consistent with scar and peri-infarct ischemia, though an element of artifact cannot be excluded.  LVEF 60 to 65%.  Today,Myoview was abnormal. It was discussed in detail. The patient reports he has intermittent chest pain. It mainly occurs when he walks. Also has SOB with long walks. Patient scheduled for eye surgery for droopy left eye skin, 2 hour surgery. Also supposed to have left knee total replacement, will see  MD in march.   Past Medical History:  Diagnosis Date   Arthritis    "hands, knees" (01/09/2017)   CAD (coronary artery disease)    a. 12/2016 Cath: LM nl, LAD 95p, 39m, 60/90d, LCX 80p/m, 171m, OM1 9m, OM2 90, RCA 99p, Ef 55-65%; b. 12/2016 CABG x 4 (LIMA->mLAD, VG->dLAD, VG->OM1->OM2); c. 11/2018 MV: EF 66%, no ischemia.   Cardiac murmur    Grade II/VI early peaking systolic in aortic area   CHF (congestive heart failure) (HCC)    Essential hypertension    GERD (gastroesophageal reflux disease)    High cholesterol     History of echocardiogram    a. 12/2016 Echo: EF 65-70%.   Left carotid bruit    Wears dentures    partial upper    Past Surgical History:  Procedure Laterality Date   CATARACT EXTRACTION W/PHACO Left 06/12/2020   Procedure: CATARACT EXTRACTION PHACO AND INTRAOCULAR LENS PLACEMENT (IOC) LEFT 4.36 00:51.9 8.4%;  Surgeon: Lockie Mola, MD;  Location: [facility];  Service: Ophthalmology;  Laterality: Left;   COLONOSCOPY WITH PROPOFOL N/A 11/09/2016   Procedure: COLONOSCOPY WITH PROPOFOL;  Surgeon: Earline Mayotte, MD;  Location: [facility];  Service: Endoscopy;  Laterality: N/A;   COLONOSCOPY WITH PROPOFOL N/A 11/29/2019   Procedure: COLONOSCOPY WITH PROPOFOL;  Surgeon: Earline Mayotte, MD;  Location: [facility];  Service: Endoscopy;  Laterality: N/A;   CORONARY ARTERY BYPASS GRAFT N/A 01/12/2017   CABG x 4: LIMA to mLAD, SVG to dLAD, sequential SVG to OM1-OM2, EVH via left thigh and leg;  Surgeon: Purcell Nails, MD;  Location: [facility] OR;  Service: Open Heart Surgery;  Laterality: N/A;   INTRAOPERATIVE TRANSESOPHAGEAL ECHOCARDIOGRAM N/A 01/12/2017   Procedure: INTRAOPERATIVE TRANSESOPHAGEAL ECHOCARDIOGRAM;  Surgeon: Purcell Nails, MD;  Location: [facility] OR;  Service: Open Heart Surgery;  Laterality: N/A;   LEFT HEART CATH AND CORONARY ANGIOGRAPHY Left 01/09/2017   Procedure: Left Heart Cath and Coronary Angiography;  Surgeon: Iran Ouch, MD;  Location: [facility];  Service: Cardiovascular;  Laterality: Left;   TOTAL KNEE ARTHROPLASTY Right 01/18/2021   Procedure: TOTAL KNEE ARTHROPLASTY;  Surgeon: Lyndle Herrlich, MD;  Location: [facility] ORS;  Service: Orthopedics;  Laterality: Right;    Current Medications: Current Meds  Medication Sig   amLODipine (NORVASC) 10 MG tablet TAKE 1 TABLET EVERY DAY   aspirin 81 MG chewable tablet Chew 81 mg by mouth daily.   atorvastatin (LIPITOR) 40 MG tablet Take 40 mg by mouth daily.   carvedilol (COREG) 25 MG tablet  TAKE 1 TABLET TWICE DAILY   dextromethorphan-guaiFENesin (MUCINEX DM) 30-600 MG 12hr tablet Take 1 tablet by mouth 2 (two) times daily as needed.   furosemide (LASIX) 20 MG tablet Take 1 tablet (20 mg total) by mouth daily.   loratadine (CLARITIN) 10 MG tablet Take 10 mg by mouth daily as needed for allergies.   meloxicam (MOBIC) 15 MG tablet Take 15 mg by mouth at bedtime.   moexipril (UNIVASC) 15 MG tablet Take 15 mg by mouth at bedtime.    pantoprazole (PROTONIX) 40 MG tablet Take 1 tablet (40 mg total) by mouth daily.   predniSONE (DELTASONE) 50 MG tablet Take 50 mg by mouth daily with breakfast.   [DISCONTINUED] predniSONE (DELTASONE) 50 MG tablet Take with food (Patient taking differently: Take 50 mg by mouth daily with breakfast. Take with food)     Allergies:   Patient has no known allergies.   Social History  Socioeconomic History   Marital status: Divorced    Spouse name: Not on file   Number of children: 3   Years of education: Not on file   Highest education level: Not on file  Occupational History   Not on file  Tobacco Use   Smoking status: Never   Smokeless tobacco: Never  Vaping Use   Vaping Use: Never used  Substance and Sexual Activity   Alcohol use: No   Drug use: No   Sexual activity: Not Currently  Other Topics Concern   Not on file  Social History Narrative   Not on file   Social Determinants of Health   Financial Resource Strain: Not on file  Food Insecurity: Not on file  Transportation Needs: Not on file  Physical Activity: Sufficiently Active   Days of Exercise per Week: 7 days   Minutes of Exercise per Session: 120 min  Stress: Not on file  Social Connections: Not on file     Family History: The patient's family history includes Colon cancer in his brother; Heart attack in his mother.  ROS:   Please see the history of present illness.     All other systems reviewed and are negative.  EKGs/Labs/Other Studies Reviewed:    The  following studies were reviewed today:  Myoview Lexiscan 07/13/21   Abnormal, probably low risk pharmacologic myocardial perfusion stress test.   There is a small in size, moderate to severe, partially reversible defect involving the apical inferior and mid inferolateral segments most consistent with scar and peri-infarct ischemia, though an element of artifact cannot be excluded.   Left ventricular function is normal (LVEF 60-65%).   Attenuation correction CT demonstrates post CABG findings, coronary artery calcification, and aortic atherosclerosis.   Compared to the prior study of 12/07/2018, mid/apical inferior/inferolateral defect is new.    Echo 10/15/20  1. Left ventricular ejection fraction, by estimation, is 60 to 65%. The  left ventricle has normal function. The left ventricle has no regional  wall motion abnormalities. Left ventricular diastolic parameters are  consistent with Grade I diastolic  dysfunction (impaired relaxation).   2. Right ventricular systolic function is normal. The right ventricular  size is normal. There is normal pulmonary artery systolic pressure. The  estimated right ventricular systolic pressure is 0000000 mmHg.   3. Left atrial size was mildly dilated.   4. The mitral valve is normal in structure. Mild mitral valve  regurgitation.   5. The aortic valve is normal in structure. Aortic valve regurgitation is  mild.   Cardiac cath 12/2016 Prox Cx to Mid Cx lesion, 80 %stenosed. Ost 1st Mrg to 1st Mrg lesion, 60 %stenosed. Mid Cx lesion, 100 %stenosed. Ost LAD to Prox LAD lesion, 95 %stenosed. Mid LAD lesion, 60 %stenosed. Dist LAD-2 lesion, 60 %stenosed. Dist LAD-1 lesion, 90 %stenosed. Ost 2nd Mrg to 2nd Mrg lesion, 90 %stenosed. Prox RCA to Mid RCA lesion, 99 %stenosed. The left ventricular systolic function is normal. LV end diastolic pressure is normal. The left ventricular ejection fraction is 55-65% by visual estimate.   1. Significant  three-vessel coronary artery disease. Left dominant system. 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 2. Normal LV systolic function and high normal left ventricular end-diastolic pressure.   Recommendations: Overall difficult revascularization options given that the diffuse disease. PCI will require long segments of stenting especially the LAD. Also the left circumflex disease is a bifurcation stenosis involving 2 large OM branches. CABG is limited by significant distal LAD disease. Given  severity of disease, the patient will need to be hospitalized and started on a heparin drip 8 hours after sheath pull. I switched diltiazem to metoprolol. Continue atorvastatin. Transferr to [facility] for a heart team decision and consultation with CVTS. It might be possible to place a graft on both mid and distal LAD.  EKG:  EKG is not ordered today.    Recent Labs: 05/02/2021: ALT 14; B Natriuretic Peptide 218.6 05/05/2021: BUN 21; Creatinine, Ser 0.96; Hemoglobin 11.5; Magnesium 2.5; Platelets 257; Potassium 4.3; Sodium 136  Recent Lipid Panel    Component Value Date/Time   CHOL 90 05/04/2021 0401   CHOL 111 04/28/2021 0823   TRIG 56 05/04/2021 0401   HDL 19 (L) 05/04/2021 0401   HDL 35 (L) 04/28/2021 0823   CHOLHDL 4.7 05/04/2021 0401   VLDL 11 05/04/2021 0401   LDLCALC 60 05/04/2021 0401   LDLCALC 60 04/28/2021 0823    Physical Exam:    VS:  BP 110/80 (BP Location: Left Arm, Patient Position: Sitting, Cuff Size: Normal)    Pulse 78    Ht 5\' 6"  (1.676 m)    Wt 217 lb (98.4 kg)    SpO2 98%    BMI 35.02 kg/m     Wt Readings from Last 3 Encounters:  07/21/21 217 lb (98.4 kg)  07/09/21 229 lb (103.9 kg)  05/03/21 221 lb 4.8 oz (100.4 kg)     GEN:  Well nourished, well developed in no acute distress HEENT: Normal NECK: No JVD; No carotid bruits LYMPHATICS: No lymphadenopathy CARDIAC: RRR, no murmurs, rubs, gallops RESPIRATORY:  Clear to auscultation without rales, wheezing or rhonchi  ABDOMEN:  Soft, non-tender, non-distended MUSCULOSKELETAL:  No edema; No deformity  SKIN: Warm and dry NEUROLOGIC:  Alert and oriented x 3 PSYCHIATRIC:  Normal affect   ASSESSMENT:    1. Chest pain, unspecified type   2. Coronary artery disease involving native coronary artery of native heart without angina pectoris   3. Essential hypertension   4. Murmur    PLAN:    In order of problems listed above:  CAD s/p prior CABG in 2018 Patient had abnormal Myoview with new defect, normal EF (report above). Patient reports persistent exertional chest pain and shortness of breath. Was previously on Imdur, but this was stopped for good BP. Blood pressure today on the softer side. We will set him up for left cardiac catheterization. He reports he is needing elective eye surgery and elective L knee replacement, however this will have to wait, patient understands this. Continue Aspirin, Lipitor, and Coreg. We will see him back after cath.  HTN BP good. Continue current medication.   HLD LDL 60 04/2021. Continue statin.  Murmur Echo 09/2020 showed normal LVSF, G1DD, mild MR, mild AI.   Disposition: Follow up in 1 month with MD/APP   Shared Decision Making/Informed Consent   Shared Decision Making/Informed Consent The risks [stroke (1 in 1000), death (1 in 1000), kidney failure [usually temporary] (1 in 500), bleeding (1 in 200), allergic reaction [possibly serious] (1 in 200)], benefits (diagnostic support and management of coronary artery disease) and alternatives of a cardiac catheterization were discussed in detail with Mr. Mayle and he is willing to proceed.    Signed, Jailynne Opperman Ninfa Meeker, PA-C  07/21/2021 10:09 AM    [facility]

## 2021-07-22 LAB — BASIC METABOLIC PANEL
BUN/Creatinine Ratio: 15 (ref 10–24)
BUN: 19 mg/dL (ref 8–27)
CO2: 23 mmol/L (ref 20–29)
Calcium: 9.1 mg/dL (ref 8.6–10.2)
Chloride: 98 mmol/L (ref 96–106)
Creatinine, Ser: 1.26 mg/dL (ref 0.76–1.27)
Glucose: 163 mg/dL — ABNORMAL HIGH (ref 70–99)
Potassium: 4.6 mmol/L (ref 3.5–5.2)
Sodium: 135 mmol/L (ref 134–144)
eGFR: 60 mL/min/{1.73_m2} (ref >59)

## 2021-07-22 LAB — CBC
Hematocrit: 42.6 % (ref 37.5–51.0)
Hemoglobin: 13.8 g/dL (ref 13.0–17.7)
MCH: 26 pg — ABNORMAL LOW (ref 26.6–33.0)
MCHC: 32.4 g/dL (ref 31.5–35.7)
MCV: 80 fL (ref 79–97)
Platelets: 367 10*3/uL (ref 150–450)
RBC: 5.31 x10E6/uL (ref 4.14–5.80)
RDW: 14.1 % (ref 11.6–15.4)
WBC: 16.1 10*3/uL — ABNORMAL HIGH (ref 3.4–10.8)

## 2021-07-26 ENCOUNTER — Encounter: Payer: Self-pay | Admitting: Cardiovascular Disease

## 2021-07-26 ENCOUNTER — Ambulatory Visit
Admission: RE | Admit: 2021-07-26 | Discharge: 2021-07-26 | Disposition: A | Discharge status: Home / Self Care | Payer: Medicare HMO | Attending: Cardiovascular Disease | Admitting: Cardiovascular Disease

## 2021-07-26 ENCOUNTER — Other Ambulatory Visit: Payer: Self-pay

## 2021-07-26 ENCOUNTER — Encounter
Admission: RE | Disposition: A | Discharge status: Home / Self Care | Payer: Self-pay | Source: Home / Self Care | Attending: Cardiovascular Disease

## 2021-07-26 DIAGNOSIS — E785 Hyperlipidemia, unspecified: Secondary | ICD-10-CM | POA: Insufficient documentation

## 2021-07-26 DIAGNOSIS — R0789 Other chest pain: Secondary | ICD-10-CM | POA: Insufficient documentation

## 2021-07-26 DIAGNOSIS — I25810 Atherosclerosis of coronary artery bypass graft(s) without angina pectoris: Secondary | ICD-10-CM | POA: Diagnosis present

## 2021-07-26 DIAGNOSIS — R011 Cardiac murmur, unspecified: Secondary | ICD-10-CM | POA: Diagnosis not present

## 2021-07-26 DIAGNOSIS — I25118 Atherosclerotic heart disease of native coronary artery with other forms of angina pectoris: Secondary | ICD-10-CM

## 2021-07-26 DIAGNOSIS — R079 Chest pain, unspecified: Secondary | ICD-10-CM

## 2021-07-26 DIAGNOSIS — I509 Heart failure, unspecified: Secondary | ICD-10-CM | POA: Insufficient documentation

## 2021-07-26 DIAGNOSIS — Z7982 Long term (current) use of aspirin: Secondary | ICD-10-CM | POA: Insufficient documentation

## 2021-07-26 DIAGNOSIS — R9439 Abnormal result of other cardiovascular function study: Secondary | ICD-10-CM

## 2021-07-26 DIAGNOSIS — I2510 Atherosclerotic heart disease of native coronary artery without angina pectoris: Secondary | ICD-10-CM

## 2021-07-26 DIAGNOSIS — I110 Hypertensive heart disease with heart failure: Secondary | ICD-10-CM | POA: Insufficient documentation

## 2021-07-26 DIAGNOSIS — Z79899 Other long term (current) drug therapy: Secondary | ICD-10-CM | POA: Diagnosis not present

## 2021-07-26 HISTORY — PX: LEFT HEART CATH AND CORS/GRAFTS ANGIOGRAPHY: CATH118250

## 2021-07-26 LAB — GLUCOSE, CAPILLARY: Glucose-Capillary: 121 mg/dL — ABNORMAL HIGH (ref 70–99)

## 2021-07-26 SURGERY — LEFT HEART CATH AND CORS/GRAFTS ANGIOGRAPHY
Anesthesia: Moderate Sedation

## 2021-07-26 MED ORDER — ASPIRIN 81 MG PO CHEW
81.0000 mg | CHEWABLE_TABLET | ORAL | Status: AC
  Administered 2021-07-26: 81 mg via ORAL

## 2021-07-26 MED ORDER — HEPARIN (PORCINE) IN NACL 1000-0.9 UT/500ML-% IV SOLN
INTRAVENOUS | Status: AC
  Filled 2021-07-26: qty 1000

## 2021-07-26 MED ORDER — SODIUM CHLORIDE 0.9% FLUSH: 3.0000 mL | INTRAVENOUS | Status: DC | PRN

## 2021-07-26 MED ORDER — MIDAZOLAM HCL 2 MG/2ML IJ SOLN
INTRAMUSCULAR | Status: DC | PRN
  Administered 2021-07-26: 1 mg via INTRAVENOUS

## 2021-07-26 MED ORDER — FENTANYL CITRATE (PF) 100 MCG/2ML IJ SOLN
INTRAMUSCULAR | Status: DC | PRN
  Administered 2021-07-26: 50 ug via INTRAVENOUS

## 2021-07-26 MED ORDER — SODIUM CHLORIDE 0.9 % IV SOLN: 250.0000 mL | INTRAVENOUS | Status: DC | PRN

## 2021-07-26 MED ORDER — SODIUM CHLORIDE 0.9% FLUSH: 3.0000 mL | Freq: Two times a day (BID) | INTRAVENOUS | Status: DC

## 2021-07-26 MED ORDER — ACETAMINOPHEN 325 MG PO TABS: 650.0000 mg | ORAL_TABLET | ORAL | Status: DC | PRN

## 2021-07-26 MED ORDER — LIDOCAINE HCL 1 % IJ SOLN
INTRAMUSCULAR | Status: AC
  Filled 2021-07-26: qty 20

## 2021-07-26 MED ORDER — HEPARIN SODIUM (PORCINE) 1000 UNIT/ML IJ SOLN
INTRAMUSCULAR | Status: DC | PRN
  Administered 2021-07-26: 4500 [IU] via INTRAVENOUS

## 2021-07-26 MED ORDER — VERAPAMIL HCL 2.5 MG/ML IV SOLN
INTRAVENOUS | Status: AC
  Filled 2021-07-26: qty 2

## 2021-07-26 MED ORDER — MIDAZOLAM HCL 2 MG/2ML IJ SOLN
INTRAMUSCULAR | Status: AC
  Filled 2021-07-26: qty 2

## 2021-07-26 MED ORDER — HEPARIN (PORCINE) IN NACL 1000-0.9 UT/500ML-% IV SOLN
INTRAVENOUS | Status: DC | PRN
  Administered 2021-07-26 (×2): 500 mL

## 2021-07-26 MED ORDER — LIDOCAINE HCL (PF) 1 % IJ SOLN
INTRAMUSCULAR | Status: DC | PRN
  Administered 2021-07-26: 2 mL

## 2021-07-26 MED ORDER — SODIUM CHLORIDE 0.9 % IV SOLN: INTRAVENOUS | Status: DC

## 2021-07-26 MED ORDER — HEPARIN SODIUM (PORCINE) 1000 UNIT/ML IJ SOLN
INTRAMUSCULAR | Status: AC
  Filled 2021-07-26: qty 10

## 2021-07-26 MED ORDER — VERAPAMIL HCL 2.5 MG/ML IV SOLN
INTRAVENOUS | Status: DC | PRN
  Administered 2021-07-26: 2.5 mg via INTRA_ARTERIAL

## 2021-07-26 MED ORDER — FENTANYL CITRATE (PF) 100 MCG/2ML IJ SOLN
INTRAMUSCULAR | Status: AC
  Filled 2021-07-26: qty 2

## 2021-07-26 MED ORDER — SODIUM CHLORIDE 0.9 % WEIGHT BASED INFUSION: 1.0000 mL/kg/h | INTRAVENOUS | Status: DC

## 2021-07-26 MED ORDER — SODIUM CHLORIDE 0.9 % WEIGHT BASED INFUSION
3.0000 mL/kg/h | INTRAVENOUS | Status: DC
  Administered 2021-07-26: 3 mL/kg/h via INTRAVENOUS

## 2021-07-26 MED ORDER — CLOPIDOGREL BISULFATE 75 MG PO TABS: 75.0000 mg | ORAL_TABLET | Freq: Every day | ORAL | 11 refills | Status: DC

## 2021-07-26 MED ORDER — ONDANSETRON HCL 4 MG/2ML IJ SOLN: 4.0000 mg | Freq: Four times a day (QID) | INTRAMUSCULAR | Status: DC | PRN

## 2021-07-26 MED ORDER — IOHEXOL 300 MG/ML  SOLN
INTRAMUSCULAR | Status: DC | PRN
  Administered 2021-07-26: 114 mL

## 2021-07-26 SURGICAL SUPPLY — 14 items
CATH 5F 110X4 TIG (CATHETERS) ×1 IMPLANT
CATH INFINITI 5 FR IM (CATHETERS) ×1 IMPLANT
CATH INFINITI 5FR ANG PIGTAIL (CATHETERS) ×1 IMPLANT
CATH INFINITI JR4 5F (CATHETERS) ×1 IMPLANT
DEVICE RAD TR BAND REGULAR (VASCULAR PRODUCTS) ×1 IMPLANT
DRAPE BRACHIAL (DRAPES) ×1 IMPLANT
GLIDESHEATH SLEND SS 6F .021 (SHEATH) ×1 IMPLANT
GUIDEWIRE INQWIRE 1.5J.035X260 (WIRE) IMPLANT
INQWIRE 1.5J .035X260CM (WIRE) ×2
KIT SYRINGE INJ CVI SPIKEX1 (MISCELLANEOUS) ×1 IMPLANT
PACK CARDIAC CATH (CUSTOM PROCEDURE TRAY) ×2 IMPLANT
PROTECTION STATION PRESSURIZED (MISCELLANEOUS) ×2
SET ATX SIMPLICITY (MISCELLANEOUS) ×1 IMPLANT
STATION PROTECTION PRESSURIZED (MISCELLANEOUS) IMPLANT

## 2021-07-26 NOTE — Interval H&P Note (Signed)
Cath Lab Visit (complete for each Cath Lab visit)  Clinical Evaluation Leading to the Procedure:   ACS: No.  Non-ACS:    Anginal Classification: CCS III  Anti-ischemic medical therapy: Maximal Therapy (2 or more classes of medications)  Non-Invasive Test Results: Intermediate-risk stress test findings: cardiac mortality 1-3%/year  Prior CABG: Previous CABG      History and Physical Interval Note:  07/26/2021 9:37 AM  Dale Cook.  has presented today for surgery, with the diagnosis of LT Heart Cath   CAD   Abnormal Myoview.  The various methods of treatment have been discussed with the patient and family. After consideration of risks, benefits and other options for treatment, the patient has consented to  Procedure(s): LEFT HEART CATH AND CORONARY ANGIOGRAPHY (N/A) as a surgical intervention.  The patient's history has been reviewed, patient examined, no change in status, stable for surgery.  I have reviewed the patient's chart and labs.  Questions were answered to the patient's satisfaction.     [name]

## 2021-08-05 ENCOUNTER — Emergency Department: Payer: Medicare HMO

## 2021-08-05 ENCOUNTER — Observation Stay
Admission: EM | Admit: 2021-08-05 | Discharge: 2021-08-06 | Disposition: A | Discharge status: Home / Self Care | Payer: Medicare HMO | Attending: Internal Medicine | Admitting: Internal Medicine

## 2021-08-05 ENCOUNTER — Encounter: Payer: Self-pay | Admitting: Emergency Medicine

## 2021-08-05 ENCOUNTER — Other Ambulatory Visit: Payer: Self-pay

## 2021-08-05 ENCOUNTER — Telehealth: Payer: Self-pay | Admitting: Cardiovascular Disease

## 2021-08-05 DIAGNOSIS — R262 Difficulty in walking, not elsewhere classified: Secondary | ICD-10-CM | POA: Diagnosis not present

## 2021-08-05 DIAGNOSIS — I25110 Atherosclerotic heart disease of native coronary artery with unstable angina pectoris: Secondary | ICD-10-CM

## 2021-08-05 DIAGNOSIS — N179 Acute kidney failure, unspecified: Secondary | ICD-10-CM | POA: Diagnosis not present

## 2021-08-05 DIAGNOSIS — E785 Hyperlipidemia, unspecified: Secondary | ICD-10-CM | POA: Diagnosis not present

## 2021-08-05 DIAGNOSIS — Z20822 Contact with and (suspected) exposure to covid-19: Secondary | ICD-10-CM | POA: Diagnosis not present

## 2021-08-05 DIAGNOSIS — I2510 Atherosclerotic heart disease of native coronary artery without angina pectoris: Secondary | ICD-10-CM | POA: Diagnosis not present

## 2021-08-05 DIAGNOSIS — R0602 Shortness of breath: Secondary | ICD-10-CM | POA: Diagnosis present

## 2021-08-05 DIAGNOSIS — I25708 Atherosclerosis of coronary artery bypass graft(s), unspecified, with other forms of angina pectoris: Secondary | ICD-10-CM

## 2021-08-05 DIAGNOSIS — R079 Chest pain, unspecified: Secondary | ICD-10-CM

## 2021-08-05 DIAGNOSIS — I200 Unstable angina: Secondary | ICD-10-CM

## 2021-08-05 LAB — CBC
HCT: 38.2 % — ABNORMAL LOW (ref 39.0–52.0)
Hemoglobin: 12 g/dL — ABNORMAL LOW (ref 13.0–17.0)
MCH: 25.9 pg — ABNORMAL LOW (ref 26.0–34.0)
MCHC: 31.4 g/dL (ref 30.0–36.0)
MCV: 82.5 fL (ref 80.0–100.0)
Platelets: 249 10*3/uL (ref 150–400)
RBC: 4.63 MIL/uL (ref 4.22–5.81)
RDW: 14.6 % (ref 11.5–15.5)
WBC: 7.4 10*3/uL (ref 4.0–10.5)
nRBC: 0 % (ref 0.0–0.2)

## 2021-08-05 LAB — RESP PANEL BY RT-PCR (FLU A&B, COVID) ARPGX2
Influenza A by PCR: NEGATIVE
Influenza B by PCR: NEGATIVE
SARS Coronavirus 2 by RT PCR: NEGATIVE

## 2021-08-05 LAB — BASIC METABOLIC PANEL
Anion gap: 8 (ref 5–15)
BUN: 34 mg/dL — ABNORMAL HIGH (ref 8–23)
CO2: 23 mmol/L (ref 22–32)
Calcium: 8.7 mg/dL — ABNORMAL LOW (ref 8.9–10.3)
Chloride: 98 mmol/L (ref 98–111)
Creatinine, Ser: 2.5 mg/dL — ABNORMAL HIGH (ref 0.61–1.24)
GFR, Estimated: 26 mL/min — ABNORMAL LOW (ref >60)
Glucose, Bld: 132 mg/dL — ABNORMAL HIGH (ref 70–99)
Potassium: 5.2 mmol/L — ABNORMAL HIGH (ref 3.5–5.1)
Sodium: 129 mmol/L — ABNORMAL LOW (ref 135–145)

## 2021-08-05 LAB — TROPONIN I (HIGH SENSITIVITY)
Troponin I (High Sensitivity): 6 ng/L (ref <18)
Troponin I (High Sensitivity): 5 ng/L

## 2021-08-05 MED ORDER — LORATADINE 10 MG PO TABS: 10.0000 mg | ORAL_TABLET | Freq: Every day | ORAL | Status: DC | PRN

## 2021-08-05 MED ORDER — CLOPIDOGREL BISULFATE 75 MG PO TABS
75.0000 mg | ORAL_TABLET | Freq: Every day | ORAL | Status: DC
  Administered 2021-08-06: 75 mg via ORAL
  Filled 2021-08-05: qty 1

## 2021-08-05 MED ORDER — ENOXAPARIN SODIUM 40 MG/0.4ML IJ SOSY
40.0000 mg | PREFILLED_SYRINGE | INTRAMUSCULAR | Status: DC
  Administered 2021-08-05: 40 mg via SUBCUTANEOUS
  Filled 2021-08-05: qty 0.4

## 2021-08-05 MED ORDER — TRAZODONE HCL 50 MG PO TABS: 25.0000 mg | ORAL_TABLET | Freq: Every evening | ORAL | Status: DC | PRN

## 2021-08-05 MED ORDER — ATORVASTATIN CALCIUM 20 MG PO TABS
40.0000 mg | ORAL_TABLET | Freq: Every day | ORAL | Status: DC
  Administered 2021-08-06: 40 mg via ORAL
  Filled 2021-08-05: qty 2

## 2021-08-05 MED ORDER — CARVEDILOL 6.25 MG PO TABS
25.0000 mg | ORAL_TABLET | Freq: Two times a day (BID) | ORAL | Status: DC
  Administered 2021-08-05 – 2021-08-06 (×2): 25 mg via ORAL
  Filled 2021-08-05: qty 4
  Filled 2021-08-05: qty 1

## 2021-08-05 MED ORDER — ACETAMINOPHEN 650 MG RE SUPP: 650.0000 mg | Freq: Four times a day (QID) | RECTAL | Status: DC | PRN

## 2021-08-05 MED ORDER — GUAIFENESIN ER 600 MG PO TB12: 600.0000 mg | ORAL_TABLET | Freq: Two times a day (BID) | ORAL | Status: DC | PRN

## 2021-08-05 MED ORDER — SODIUM CHLORIDE 0.9 % IV SOLN: INTRAVENOUS | Status: DC

## 2021-08-05 MED ORDER — MAGNESIUM HYDROXIDE 400 MG/5ML PO SUSP: 30.0000 mL | Freq: Every day | ORAL | Status: DC | PRN

## 2021-08-05 MED ORDER — PANTOPRAZOLE SODIUM 40 MG PO TBEC
40.0000 mg | DELAYED_RELEASE_TABLET | Freq: Every day | ORAL | Status: DC
  Administered 2021-08-06: 40 mg via ORAL
  Filled 2021-08-05: qty 1

## 2021-08-05 MED ORDER — ASPIRIN 81 MG PO CHEW
324.0000 mg | CHEWABLE_TABLET | Freq: Once | ORAL | Status: AC
  Administered 2021-08-05: 324 mg via ORAL
  Filled 2021-08-05: qty 4

## 2021-08-05 MED ORDER — ASPIRIN 81 MG PO CHEW
81.0000 mg | CHEWABLE_TABLET | Freq: Every day | ORAL | Status: DC
  Administered 2021-08-06: 81 mg via ORAL
  Filled 2021-08-05: qty 1

## 2021-08-05 MED ORDER — ONDANSETRON HCL 4 MG PO TABS: 4.0000 mg | ORAL_TABLET | Freq: Four times a day (QID) | ORAL | Status: DC | PRN

## 2021-08-05 MED ORDER — SODIUM CHLORIDE 0.9 % IV BOLUS
500.0000 mL | Freq: Once | INTRAVENOUS | Status: AC
Start: 2021-08-05 — End: 2021-08-05
  Administered 2021-08-05: 500 mL via INTRAVENOUS

## 2021-08-05 MED ORDER — AMLODIPINE BESYLATE 5 MG PO TABS
10.0000 mg | ORAL_TABLET | Freq: Every day | ORAL | Status: DC
  Administered 2021-08-06: 10 mg via ORAL
  Filled 2021-08-05: qty 2

## 2021-08-05 MED ORDER — ACETAMINOPHEN 325 MG PO TABS: 650.0000 mg | ORAL_TABLET | Freq: Four times a day (QID) | ORAL | Status: DC | PRN

## 2021-08-05 MED ORDER — ONDANSETRON HCL 4 MG/2ML IJ SOLN: 4.0000 mg | Freq: Four times a day (QID) | INTRAMUSCULAR | Status: DC | PRN

## 2021-08-05 NOTE — ED Triage Notes (Signed)
Pt comes into the ED via POV c/o SHOB and chest tightness.  Pt states the sHOB is worse with exertion, and patient does present labored in breathing.  Pt states he had 3 stents placed on 07/26/21 for 3 blockages.

## 2021-08-05 NOTE — H&P (Addendum)
[place]   PATIENT NAME: Dale Cook    MR#:  [id number]  DATE OF BIRTH:  Aug 10, 1945  DATE OF ADMISSION:  08/05/2021  PRIMARY CARE PHYSICIAN: Albina Billet, MD   Patient is coming from: Home  REQUESTING/REFERRING PHYSICIAN: Merlyn Lot, MD  CHIEF COMPLAINT:   Chief Complaint  Patient presents with   Shortness of Breath    HISTORY OF PRESENT ILLNESS:  Stepfan Brining. is a 76 y.o. African-American male with medical history significant for coronary artery disease, status post four-vessel CABG, status post PCI and stent, on 1/30 by Dr. Fletcher Anon, CHF, hypertension, GERD and dyslipidemia, who presented to the ER with onset of chest tightness mild in intensity and without radiation and dyspnea started today.  He had admits to dyspnea on exertion since his  recent PCI.  He denies any cough or wheezing.  No nausea or vomiting or abdominal pain.  He admits to urinary hesitancy and dysuria without frequency or urgency or hematuria or flank pain.  No fever or chills.  ED Course: When the patient came to the ER, vital signs were within normal.  Labs revealed mild hyponatremia 129 and hyperkalemia 5.2.  BUN was 34 and creatinine 2.5, previously normal.  High-sensitivity troponin was 6 and later 5.  CBC showed hemoglobin of 12 and hematocrit 38.2 slightly lower than previous levels last month.  EKG as reviewed by me : EKG shows normal sinus rhythm with a rate of 75 with Q waves inferiorly and T wave inversion anteroseptally and laterally Imaging: 2 view chest x-ray showed no acute cardiopulmonary disease.  The patient was given 4 baby aspirin and 500 mL IV normal saline bolus.  He will be admitted to a cardiac telemetry bed for further evaluation and management.   PAST MEDICAL HISTORY:   Past Medical History:  Diagnosis Date   Arthritis    "hands, knees" (01/09/2017)   CAD (coronary artery disease)    a. 12/2016 Cath: LM nl, LAD 95p, 55m, 60/90d, LCX 80p/m, 161m, OM1 36m,  OM2 90, RCA 99p, Ef 55-65%; b. 12/2016 CABG x 4 (LIMA->mLAD, VG->dLAD, VG->OM1->OM2); c. 11/2018 MV: EF 66%, no ischemia.   Cardiac murmur    Grade II/VI early peaking systolic in aortic area   CHF (congestive heart failure) (HCC)    Essential hypertension    GERD (gastroesophageal reflux disease)    High cholesterol    History of echocardiogram    a. 12/2016 Echo: EF 65-70%.   Left carotid bruit    Wears dentures    partial upper    PAST SURGICAL HISTORY:   Past Surgical History:  Procedure Laterality Date   CATARACT EXTRACTION W/PHACO Left 06/12/2020   Procedure: CATARACT EXTRACTION PHACO AND INTRAOCULAR LENS PLACEMENT (IOC) LEFT 4.36 00:51.9 8.4%;  Surgeon: Leandrew Koyanagi, MD;  Location: [facility];  Service: Ophthalmology;  Laterality: Left;   COLONOSCOPY WITH PROPOFOL N/A 11/09/2016   Procedure: COLONOSCOPY WITH PROPOFOL;  Surgeon: Robert Bellow, MD;  Location: [facility];  Service: Endoscopy;  Laterality: N/A;   COLONOSCOPY WITH PROPOFOL N/A 11/29/2019   Procedure: COLONOSCOPY WITH PROPOFOL;  Surgeon: Robert Bellow, MD;  Location: [facility];  Service: Endoscopy;  Laterality: N/A;   CORONARY ARTERY BYPASS GRAFT N/A 01/12/2017   CABG x 4: LIMA to mLAD, SVG to dLAD, sequential SVG to OM1-OM2, EVH via left thigh and leg;  Surgeon: Rexene Alberts, MD;  Location: [facility];  Service: Open Heart Surgery;  Laterality: N/A;  INTRAOPERATIVE TRANSESOPHAGEAL ECHOCARDIOGRAM N/A 01/12/2017   Procedure: INTRAOPERATIVE TRANSESOPHAGEAL ECHOCARDIOGRAM;  Surgeon: Rexene Alberts, MD;  Location: [facility];  Service: Open Heart Surgery;  Laterality: N/A;   LEFT HEART CATH AND CORONARY ANGIOGRAPHY Left 01/09/2017   Procedure: Left Heart Cath and Coronary Angiography;  Surgeon: Wellington Hampshire, MD;  Location: [facility] [facility];  Service: Cardiovascular;  Laterality: Left;   LEFT HEART CATH AND CORS/GRAFTS ANGIOGRAPHY N/A 07/26/2021   Procedure: LEFT HEART CATH AND  CORS/GRAFTS ANGIOGRAPHY;  Surgeon: Wellington Hampshire, MD;  Location: [facility];  Service: Cardiovascular;  Laterality: N/A;   TOTAL KNEE ARTHROPLASTY Right 01/18/2021   Procedure: TOTAL KNEE ARTHROPLASTY;  Surgeon: Lovell Sheehan, MD;  Location: [facility];  Service: Orthopedics;  Laterality: Right;    SOCIAL HISTORY:   Social History   Tobacco Use   Smoking status: Never   Smokeless tobacco: Never  Substance Use Topics   Alcohol use: No    FAMILY HISTORY:   Family History  Problem Relation Age of Onset   Heart attack Mother    Colon cancer Brother     DRUG ALLERGIES:  No Known Allergies  REVIEW OF SYSTEMS:   ROS As per history of present illness. All pertinent systems were reviewed above. Constitutional, HEENT, cardiovascular, respiratory, GI, GU, musculoskeletal, neuro, psychiatric, endocrine, integumentary and hematologic systems were reviewed and are otherwise negative/unremarkable except for positive findings mentioned above in the HPI.   MEDICATIONS AT HOME:   Prior to Admission medications   Medication Sig Start Date End Date Taking? Authorizing Provider  amLODipine (NORVASC) 10 MG tablet TAKE 1 TABLET EVERY DAY 04/19/21   Wellington Hampshire, MD  aspirin 81 MG chewable tablet Chew 81 mg by mouth daily.    [provider]  atorvastatin (LIPITOR) 40 MG tablet Take 40 mg by mouth daily.    [provider]  carvedilol (COREG) 25 MG tablet TAKE 1 TABLET TWICE DAILY 02/24/21   Wellington Hampshire, MD  clopidogrel (PLAVIX) 75 MG tablet Take 1 tablet (75 mg total) by mouth daily. 07/26/21 07/26/22  Wellington Hampshire, MD  furosemide (LASIX) 20 MG tablet Take 1 tablet (20 mg total) by mouth daily. 12/23/20   Alisa Graff, FNP  guaiFENesin (MUCINEX) 600 MG 12 hr tablet Take 600 mg by mouth 2 (two) times daily as needed (congestion).    [provider]  loratadine (CLARITIN) 10 MG tablet Take 10 mg by mouth daily as needed for allergies.     [provider]  meloxicam (MOBIC) 15 MG tablet Take 15 mg by mouth daily as needed for pain. 10/05/18   [provider]  moexipril (UNIVASC) 15 MG tablet Take 15 mg by mouth at bedtime.  10/24/18   [provider]  pantoprazole (PROTONIX) 40 MG tablet Take 1 tablet (40 mg total) by mouth daily. 06/04/21   Dunn, Areta Haber, PA-C  sulfamethoxazole-trimethoprim (BACTRIM DS) 800-160 MG tablet Take 1 tablet by mouth 2 (two) times daily. 07/28/21   [provider]  tamsulosin (FLOMAX) 0.4 MG CAPS capsule Take 0.4 mg by mouth daily. 07/28/21   [provider]      VITAL SIGNS:  Blood pressure 103/75, pulse 65, temperature 98.4 F (36.9 C), temperature source Oral, resp. rate 17, height 6' (1.829 m), weight 93.4 kg, SpO2 100 %.  PHYSICAL EXAMINATION:  Physical Exam  GENERAL:  76 y.o.-year-old African-American male patient lying in the bed with no acute distress.  EYES: Pupils  equal, round, reactive to light and accommodation. No scleral icterus. Extraocular muscles intact.  HEENT: Head atraumatic, normocephalic. Oropharynx and nasopharynx clear.  NECK:  Supple, no jugular venous distention. No thyroid enlargement, no tenderness.  LUNGS: Normal breath sounds bilaterally, no wheezing, rales,rhonchi or crepitation. No use of accessory muscles of respiration.  CARDIOVASCULAR: Regular rate and rhythm, S1, S2 normal. No murmurs, rubs, or gallops.  ABDOMEN: Soft, nondistended, nontender. Bowel sounds present. No organomegaly or mass.  EXTREMITIES: No pedal edema, cyanosis, or clubbing.  NEUROLOGIC: Cranial nerves II through XII are intact. Muscle strength 5/5 in all extremities. Sensation intact. Gait not checked.  PSYCHIATRIC: The patient is alert and oriented x 3.  Normal affect and good eye contact. SKIN: No obvious rash, lesion, or ulcer.   LABORATORY PANEL:   CBC Recent Labs  Lab 08/05/21 1831  WBC 7.4  HGB 12.0*  HCT 38.2*  PLT 249    ------------------------------------------------------------------------------------------------------------------  Chemistries  Recent Labs  Lab 08/05/21 1831  NA 129*  K 5.2*  CL 98  CO2 23  GLUCOSE 132*  BUN 34*  CREATININE 2.50*  CALCIUM 8.7*   ------------------------------------------------------------------------------------------------------------------  Cardiac Enzymes No results for input(s): TROPONINI in the last 168 hours. ------------------------------------------------------------------------------------------------------------------  RADIOLOGY:  DG Chest 2 View  Result Date: 08/05/2021 CLINICAL DATA:  Shortness of breath EXAM: CHEST - 2 VIEW COMPARISON:  05/02/2021 FINDINGS: Post sternotomy changes. No focal opacity or pleural effusion. Stable cardiomediastinal silhouette. No pneumothorax. IMPRESSION: No active cardiopulmonary disease. Electronically Signed   By: Jasmine Pang M.D.   On: 08/05/2021 19:08      IMPRESSION AND PLAN:  Principal Problem:   AKI (acute kidney injury) (HCC)  1.  Chest pain, rule out acute coronary syndrome with history of coronary artery disease, status post CABG, status post PCI and stent.  This is concerning for unstable angina. - The patient will be admitted to a cardiac telemetry bed. - We will follow serial troponins. - He will be placed on aspirin as well as as needed sublingual nitroglycerin and morphine sulfate for pain. - Cardiology consult will be obtained. - We will follow serial troponins. - I notified Dr.  Gala Romney about the patient. - We will continue statin therapy and beta-blocker therapy.  2.  Acute kidney injury likely prerenal with associated hyponatremia, likely hypovolemic. - The patient will be hydrated with IV normal saline we will follow BMP. - We will avoid nephrotoxins.  3.  Dyslipidemia. - We will continue statin therapy.  4.  GERD. - We will continue PPI therapy.  5.  Essential hypertension. -  We will continue Coreg and amlodipine. - We will hold Univasc given acute kidney injury.  6.  BPH. - We will continue Flomax.  DVT prophylaxis: Lovenox. Advanced Care Planning:  Code Status: full code. Family Communication:  The plan of care was discussed in details with the patient (and family). I answered all questions. The patient agreed to proceed with the above mentioned plan. Further management will depend upon hospital course. Disposition Plan: Back to previous home environment Consults called: Cardiology.  All the records are reviewed and case discussed with ED provider.  Status is: Inpatient  At the time of the admission, it appears that the appropriate admission status for this patient is inpatient.  This is judged to be reasonable and necessary in order to provide the required intensity of service to ensure the patient's safety given the presenting symptoms, physical exam findings and initial radiographic and laboratory data in the context  of comorbid conditions.  The patient requires inpatient status due to high intensity of service, high risk of further deterioration and high frequency of surveillance required.  I certify that at the time of admission, it is my clinical judgment that the patient will require inpatient hospital care extending more than 2 midnights.                            Dispo: The patient is from: Home              Anticipated d/c is to: Home              Patient currently is not medically stable to d/c.              Difficult to place patient: No  Christel Mormon M.D on 08/05/2021 at 9:35 PM  [facility]   From 7 PM-7 AM, contact night-coverage [email or web address]  CC: Primary care physician; Albina Billet, MD

## 2021-08-05 NOTE — Telephone Encounter (Signed)
Iran Ouch, MD  Sondra Barges, PA-C; Parris-Godley, Monia Sabal, RN Ryan,  This patient is going to see you on the 15th.  Please check with him to see when he wants to have his left circumflex PCI.  He mentioned that he needed to have eye surgery and it might make sense to let him go through eye surgery before stent placement so we do not interrupt dual antiplatelet therapy.

## 2021-08-05 NOTE — ED Provider Notes (Addendum)
[facility] Provider Note    Event Date/Time   First MD Initiated Contact with Patient 08/05/21 1909     (approximate)   History   Shortness of Breath   HPI  Dale Marso. is a 76 y.o. male recent PCI and stent placement on 30 January here by Dr. Mariea Clonts with recent echo showing preserved EF 60 to 65% presents to the ER for evaluation of chest tightness and shortness of breath and dyspnea.  Since discharge after PCI he has been very compliant with his medications but is having progressively worsening exertional dyspnea to the point now where he is feeling dyspnea even at rest.  Denies any lower extremity swelling.  No pain with deep inspiration.  He denies any chest pain cough or fevers.  Just feels like he cannot catch his breath.     Physical Exam   Triage Vital Signs: ED Triage Vitals  Enc Vitals Group     BP 08/05/21 1829 118/73     Pulse Rate 08/05/21 1829 73     Resp 08/05/21 1829 20     Temp 08/05/21 1829 98.4 F (36.9 C)     Temp Source 08/05/21 1829 Oral     SpO2 08/05/21 1829 100 %     Weight 08/05/21 1829 206 lb (93.4 kg)     Height 08/05/21 1829 6' (1.829 m)     Head Circumference --      Peak Flow --      Pain Score 08/05/21 1829 7     Pain Loc --      Pain Edu? --      Excl. in [place]? --     Most recent vital signs: Vitals:   08/05/21 1930 08/05/21 2000  BP: 112/67 103/75  Pulse: 68 65  Resp: 19 17  Temp:    SpO2: 100% 100%     Constitutional: Alert  Eyes: Conjunctivae are normal.  Head: Atraumatic. Nose: No congestion/rhinnorhea. Mouth/Throat: Mucous membranes are moist.   Neck: Painless ROM.  Cardiovascular:   Good peripheral circulation. Respiratory: Normal respiratory effort.  No retractions.  Gastrointestinal: Soft and nontender.  Musculoskeletal:  no deformity Neurologic:  MAE spontaneously. No gross focal neurologic deficits are appreciated.  Skin:  Skin is warm, dry and intact. No rash  noted. Psychiatric: Mood and affect are normal. Speech and behavior are normal.    ED Results / Procedures / Treatments   Labs (all labs ordered are listed, but only abnormal results are displayed) Labs Reviewed  BASIC METABOLIC PANEL - Abnormal; Notable for the following components:      Result Value   Sodium 129 (*)    Potassium 5.2 (*)    Glucose, Bld 132 (*)    BUN 34 (*)    Creatinine, Ser 2.50 (*)    Calcium 8.7 (*)    GFR, Estimated 26 (*)    All other components within normal limits  CBC - Abnormal; Notable for the following components:   Hemoglobin 12.0 (*)    HCT 38.2 (*)    MCH 25.9 (*)    All other components within normal limits  RESP PANEL BY RT-PCR (FLU A&B, COVID) ARPGX2  TROPONIN I (HIGH SENSITIVITY)  TROPONIN I (HIGH SENSITIVITY)     EKG  ED ECG REPORT I, Merlyn Lot, the attending physician, personally viewed and interpreted this ECG.   Date: 08/05/2021  EKG Time: 18:30  Rate: 75  Rhythm: sinus  Axis: normal  Intervals: normal  intervvals  ST&T Change: no stemi, nonspecific st abn,    RADIOLOGY Please see ED Course for my review and interpretation.  I personally reviewed all radiographic images ordered to evaluate for the above acute complaints and reviewed radiology reports and findings.  These findings were personally discussed with the patient.  Please see medical record for radiology report.    PROCEDURES:  Critical Care performed: Yes, see critical care procedure note(s)  .Critical Care Performed by: Merlyn Lot, MD Authorized by: Merlyn Lot, MD   Critical care provider statement:    Critical care time (minutes):  35   Critical care was necessary to treat or prevent imminent or life-threatening deterioration of the following conditions: unstable angina.   Critical care was time spent personally by me on the following activities:  Ordering and performing treatments and interventions, ordering and review of  laboratory studies, ordering and review of radiographic studies, pulse oximetry, re-evaluation of patient's condition, review of old charts, obtaining history from patient or surrogate, examination of patient, evaluation of patient's response to treatment, discussions with primary provider, discussions with consultants and development of treatment plan with patient or surrogate   MEDICATIONS ORDERED IN ED: Medications  aspirin chewable tablet 324 mg (has no administration in time range)  sodium chloride 0.9 % bolus 500 mL (500 mLs Intravenous New Bag/Given 08/05/21 2011)     IMPRESSION / MDM / [place] / ED COURSE  I reviewed the triage vital signs and the nursing notes.                              Differential diagnosis includes, but is not limited to, ACS, pericarditis, esophagitis, boerhaaves, pe, dissection, pna, bronchitis, costochondritis   Patient with quite extensive and complicated CAD history with recent PCI the 30th of last month presents to the ER with symptoms as described above.  His EKG with nonspecific T wave abnormality no STEMI criteria his initial troponin is negative.  We will check blood work.  The patient will be placed on continuous pulse oximetry and telemetry for monitoring.  Laboratory evaluation will be sent to evaluate for the above complaints.      Clinical Course as of 08/05/21 2057  Thu Aug 05, 2021  1926 Chest x-ray by my review without any evidence of pneumothorax.  Patient does have evidence of acute AKI.  Stable hemoglobin troponin negative. [PR]  2035 Patient reassessed.  Still having some mild chest tightness.  Will give gentle IV hydration given AKI.  I will heparinize.  Will give aspirin.  Will discuss with hospitalist for admission. [PR]  2056 Case discussed in consultation with hospitalist who is excepted patient to their service. [PR]    Clinical Course User Index [PR] Merlyn Lot, MD     FINAL CLINICAL IMPRESSION(S) / ED  DIAGNOSES   Final diagnoses:  Unstable angina ([place])     Rx / DC Orders   ED Discharge Orders     None        Note:  This document was prepared using Dragon voice recognition software and may include unintentional dictation errors.    Merlyn Lot, MD 08/05/21 [phone]

## 2021-08-05 NOTE — ED Notes (Signed)
Pt presents with SOB. Had a cardiac catheterization on the 30th. Findings were blockages in two vessels. Intermittent chest pain but biggest complaint is SOB. A&Ox4. Skin p/w/d. RR even and nonlabored. Winded with talking in Short sentences.

## 2021-08-05 NOTE — Telephone Encounter (Signed)
Patient states he had his cath and would like to know the next steps. He states he was told he had 3 blockages. Please call to discuss.

## 2021-08-06 DIAGNOSIS — N179 Acute kidney failure, unspecified: Secondary | ICD-10-CM | POA: Diagnosis not present

## 2021-08-06 DIAGNOSIS — I200 Unstable angina: Secondary | ICD-10-CM | POA: Diagnosis not present

## 2021-08-06 LAB — BASIC METABOLIC PANEL
Anion gap: 5 (ref 5–15)
BUN: 27 mg/dL — ABNORMAL HIGH (ref 8–23)
CO2: 22 mmol/L (ref 22–32)
Calcium: 8.4 mg/dL — ABNORMAL LOW (ref 8.9–10.3)
Chloride: 104 mmol/L (ref 98–111)
Creatinine, Ser: 1.67 mg/dL — ABNORMAL HIGH (ref 0.61–1.24)
GFR, Estimated: 43 mL/min — ABNORMAL LOW (ref >60)
Glucose, Bld: 102 mg/dL — ABNORMAL HIGH (ref 70–99)
Potassium: 4.9 mmol/L (ref 3.5–5.1)
Sodium: 131 mmol/L — ABNORMAL LOW (ref 135–145)

## 2021-08-06 LAB — CBC
HCT: 35.6 % — ABNORMAL LOW (ref 39.0–52.0)
Hemoglobin: 11.4 g/dL — ABNORMAL LOW (ref 13.0–17.0)
MCH: 25.7 pg — ABNORMAL LOW (ref 26.0–34.0)
MCHC: 32 g/dL (ref 30.0–36.0)
MCV: 80.4 fL (ref 80.0–100.0)
Platelets: 219 10*3/uL (ref 150–400)
RBC: 4.43 MIL/uL (ref 4.22–5.81)
RDW: 14.6 % (ref 11.5–15.5)
WBC: 6.2 10*3/uL (ref 4.0–10.5)
nRBC: 0 % (ref 0.0–0.2)

## 2021-08-06 LAB — TROPONIN I (HIGH SENSITIVITY): Troponin I (High Sensitivity): 6 ng/L (ref <18)

## 2021-08-06 MED ORDER — MORPHINE SULFATE (PF) 2 MG/ML IV SOLN: 2.0000 mg | INTRAVENOUS | Status: DC | PRN

## 2021-08-06 MED ORDER — ISOSORBIDE MONONITRATE ER 60 MG PO TB24
30.0000 mg | ORAL_TABLET | Freq: Every day | ORAL | Status: DC
  Administered 2021-08-06: 30 mg via ORAL
  Filled 2021-08-06: qty 1

## 2021-08-06 MED ORDER — ISOSORBIDE MONONITRATE ER 30 MG PO TB24: 30.0000 mg | ORAL_TABLET | Freq: Every day | ORAL | 0 refills | Status: DC

## 2021-08-06 MED ORDER — NITROGLYCERIN 0.4 MG SL SUBL: 0.4000 mg | SUBLINGUAL_TABLET | SUBLINGUAL | Status: DC | PRN

## 2021-08-06 NOTE — ED Notes (Signed)
Patient is resting comfortably. - Sleeping

## 2021-08-06 NOTE — Consult Note (Signed)
Cardiology Consultation:   Patient ID: Dale Cook.; CR:[id number]; 1945-08-28   Admit date: 08/05/2021 Date of Consult: 08/06/2021  Primary Care Provider: Albina Billet, MD Primary Cardiologist: Fletcher Anon Primary Electrophysiologist:  None   Patient Profile:   Dale Cook. is a 76 y.o. male with a hx of CAD status post CABG in 12/2016, HFpEF, HTN, and HLD who is being seen today for the evaluation of chest pain at the request of Dr. Sidney Ace.  History of Present Illness:   [name] presented in 2018 with unstable angina with LHC showing severe three-vessel CAD.  He subsequently underwent four-vessel CABG in 12/2016 with LIMA to mid LAD, SVG to distal LAD, jump graft with SVG to OM1 and OM 2.  Carotid artery ultrasound prior to surgery showed no significant disease.  Echo showed normal LV systolic function with no significant valvular abnormalities.  Lexiscan MPI on 07/13/2021 showed a small in size, moderate in severity, partially reversible defect involving the apical inferior and mid inferolateral segments most consistent with scar and peri-infarct ischemia.  Compared to prior study in 2020, the mid/apical inferior.inferolateral defect was new. LVEF normal. Due to persistent symptoms, he underwent LHC on 1/302/2023, that showed significant underlying 3-vessel CAD with patent LIMA to mid LAD, and occluded SVG to distal LAD and SVG to OM1/OM2. The RCA was not injected and known to be a small nondominant vessel and occluded in the mid segment.  There was normal LVSF and mildly elevated LVEDP. The LAD was significantly diseased after the LIMA anastomosis and likely not suitable for PCI given the extension of disease to the apical LAD. The LCx could be treated with PCI, but at the bifurcation lesion would require a long segment stenting with recommendation for staged [facility] at [facility].    Since undergoing the above cath, he has noted progressive exertional dyspnea without frank angina.  No lower extremity swelling, abdominal distension, or orthopnea. His appetite has been down and he has not been drinking much water. He reports adherence to his medications. No fever, chills, or cough. No dizziness, presyncope, or syncope. No palpitations. Work up in the ED has shown largely stable vitals with relative hypotension noted. EKG demonstrated sinus rhythm with nonspecific st/t changes as below. CXR showed no acute process. High sensitivity troponin normal x 3. Covid and influenza negative. WBC 6.2, HGB 12, potassium 5.2 trending to 4.9, BUN/SCr 34/2.5 trending to 27/1.67 with a baseline around 0.8 to 1.2. He was given aspirin 324 mg and IV fluids in the ED. Upon admission, he was continued on PTA medications and cardiology was asked to see. Currently, asymptomatic.    Past Medical History:  Diagnosis Date   Arthritis    "hands, knees" (01/09/2017)   CAD (coronary artery disease)    a. 12/2016 Cath: LM nl, LAD 95p, 43m, 60/90d, LCX 80p/m, 125m, OM1 1m, OM2 90, RCA 99p, Ef 55-65%; b. 12/2016 CABG x 4 (LIMA->mLAD, VG->dLAD, VG->OM1->OM2); c. 11/2018 MV: EF 66%, no ischemia.   Cardiac murmur    Grade II/VI early peaking systolic in aortic area   CHF (congestive heart failure) (HCC)    Essential hypertension    GERD (gastroesophageal reflux disease)    High cholesterol    History of echocardiogram    a. 12/2016 Echo: EF 65-70%.   Left carotid bruit    Wears dentures    partial upper    Past Surgical History:  Procedure Laterality Date   CATARACT EXTRACTION W/PHACO  Left 06/12/2020   Procedure: CATARACT EXTRACTION PHACO AND INTRAOCULAR LENS PLACEMENT (IOC) LEFT 4.36 00:51.9 8.4%;  Surgeon: Leandrew Koyanagi, MD;  Location: [place];  Service: Ophthalmology;  Laterality: Left;   COLONOSCOPY WITH PROPOFOL N/A 11/09/2016   Procedure: COLONOSCOPY WITH PROPOFOL;  Surgeon: Robert Bellow, MD;  Location: [facility];  Service: Endoscopy;  Laterality: N/A;   COLONOSCOPY WITH  PROPOFOL N/A 11/29/2019   Procedure: COLONOSCOPY WITH PROPOFOL;  Surgeon: Robert Bellow, MD;  Location: [facility];  Service: Endoscopy;  Laterality: N/A;   CORONARY ARTERY BYPASS GRAFT N/A 01/12/2017   CABG x 4: LIMA to mLAD, SVG to dLAD, sequential SVG to OM1-OM2, EVH via left thigh and leg;  Surgeon: Rexene Alberts, MD;  Location: [place];  Service: Open Heart Surgery;  Laterality: N/A;   INTRAOPERATIVE TRANSESOPHAGEAL ECHOCARDIOGRAM N/A 01/12/2017   Procedure: INTRAOPERATIVE TRANSESOPHAGEAL ECHOCARDIOGRAM;  Surgeon: Rexene Alberts, MD;  Location: [place];  Service: Open Heart Surgery;  Laterality: N/A;   LEFT HEART CATH AND CORONARY ANGIOGRAPHY Left 01/09/2017   Procedure: Left Heart Cath and Coronary Angiography;  Surgeon: Wellington Hampshire, MD;  Location: [place] [facility];  Service: Cardiovascular;  Laterality: Left;   LEFT HEART CATH AND CORS/GRAFTS ANGIOGRAPHY N/A 07/26/2021   Procedure: LEFT HEART CATH AND CORS/GRAFTS ANGIOGRAPHY;  Surgeon: Wellington Hampshire, MD;  Location: [place] [facility];  Service: Cardiovascular;  Laterality: N/A;   TOTAL KNEE ARTHROPLASTY Right 01/18/2021   Procedure: TOTAL KNEE ARTHROPLASTY;  Surgeon: Lovell Sheehan, MD;  Location: [facility];  Service: Orthopedics;  Laterality: Right;     Home Meds: Prior to Admission medications   Medication Sig Start Date End Date Taking? Authorizing Provider  amLODipine (NORVASC) 10 MG tablet TAKE 1 TABLET EVERY DAY 04/19/21  Yes Wellington Hampshire, MD  aspirin 81 MG chewable tablet Chew 81 mg by mouth daily.   Yes [provider]  atorvastatin (LIPITOR) 40 MG tablet Take 40 mg by mouth daily.   Yes [provider]  carvedilol (COREG) 25 MG tablet TAKE 1 TABLET TWICE DAILY 02/24/21  Yes Wellington Hampshire, MD  clopidogrel (PLAVIX) 75 MG tablet Take 1 tablet (75 mg total) by mouth daily. 07/26/21 07/26/22 Yes Wellington Hampshire, MD  furosemide (LASIX) 20 MG tablet Take 1 tablet (20 mg total) by mouth  daily. 12/23/20  Yes Hackney, Tina A, FNP  moexipril (UNIVASC) 15 MG tablet Take 15 mg by mouth at bedtime.  10/24/18  Yes [provider]  pantoprazole (PROTONIX) 40 MG tablet Take 1 tablet (40 mg total) by mouth daily. 06/04/21  Yes Shadrack Brummitt, Areta Haber, PA-C  sulfamethoxazole-trimethoprim (BACTRIM DS) 800-160 MG tablet Take 1 tablet by mouth 2 (two) times daily. 07/28/21  Yes [provider]  tamsulosin (FLOMAX) 0.4 MG CAPS capsule Take 0.4 mg by mouth daily. 07/28/21  Yes [provider]  guaiFENesin (MUCINEX) 600 MG 12 hr tablet Take 600 mg by mouth 2 (two) times daily as needed (congestion).    [provider]  loratadine (CLARITIN) 10 MG tablet Take 10 mg by mouth daily as needed for allergies.    [provider]  meloxicam (MOBIC) 15 MG tablet Take 15 mg by mouth daily as needed for pain. 10/05/18   [provider]    Inpatient Medications: Scheduled Meds:  amLODipine  10 mg Oral Daily   aspirin  81 mg Oral Daily   atorvastatin  40 mg Oral Daily   carvedilol  25 mg  Oral BID   clopidogrel  75 mg Oral Daily   enoxaparin (LOVENOX) injection  40 mg Subcutaneous Q24H   pantoprazole  40 mg Oral Daily   Continuous Infusions:  sodium chloride 100 mL/hr at 08/06/21 0245   PRN Meds: acetaminophen **OR** acetaminophen, guaiFENesin, loratadine, magnesium hydroxide, morphine injection, nitroGLYCERIN, ondansetron **OR** ondansetron (ZOFRAN) IV, traZODone  Allergies:  No Known Allergies  Social History:   Social History   Socioeconomic History   Marital status: Divorced    Spouse name: Not on file   Number of children: 3   Years of education: Not on file   Highest education level: Not on file  Occupational History   Not on file  Tobacco Use   Smoking status: Never   Smokeless tobacco: Never  Vaping Use   Vaping Use: Never used  Substance and Sexual Activity   Alcohol use: No   Drug use: No   Sexual activity: Not Currently  Other Topics  Concern   Not on file  Social History Narrative   Not on file   Social Determinants of Health   Financial Resource Strain: Not on file  Food Insecurity: Not on file  Transportation Needs: Not on file  Physical Activity: Sufficiently Active   Days of Exercise per Week: 7 days   Minutes of Exercise per Session: 120 min  Stress: Not on file  Social Connections: Not on file  Intimate Partner Violence: Not on file     Family History:   Family History  Problem Relation Age of Onset   Heart attack Mother    Colon cancer Brother     ROS:  Review of Systems  Constitutional:  Positive for malaise/fatigue. Negative for chills, diaphoresis, fever and weight loss.  HENT:  Negative for congestion.   Eyes:  Negative for discharge and redness.  Respiratory:  Positive for shortness of breath. Negative for cough, sputum production and wheezing.   Cardiovascular:  Negative for chest pain, palpitations, orthopnea, claudication, leg swelling and PND.  Gastrointestinal:  Negative for abdominal pain, blood in stool, heartburn, melena, nausea and vomiting.  Musculoskeletal:  Negative for falls and myalgias.  Skin:  Negative for rash.  Neurological:  Negative for dizziness, tingling, tremors, sensory change, speech change, focal weakness, loss of consciousness and weakness.  Endo/Heme/Allergies:  Does not bruise/bleed easily.  Psychiatric/Behavioral:  Negative for substance abuse. The patient is not nervous/anxious.   All other systems reviewed and are negative.    Physical Exam/Data:   Vitals:   08/06/21 0400 08/06/21 0430 08/06/21 0511 08/06/21 0530  BP: 109/60 (!) 110/56 116/62 (!) 109/59  Pulse: 62 70 68 68  Resp:   18 18  Temp:      TempSrc:      SpO2: 91% 97% 94% 96%  Weight:      Height:        Intake/Output Summary (Last 24 hours) at 08/06/2021 0715 Last data filed at 08/06/2021 0657 Gross per 24 hour  Intake 990 ml  Output 1400 ml  Net -410 ml   Filed Weights   08/05/21  1829  Weight: 93.4 kg   Body mass index is 27.94 kg/m.   Physical Exam: General: Well developed, well nourished, in no acute distress. Head: Normocephalic, atraumatic, sclera non-icteric, no xanthomas, nares without discharge.  Neck: Negative for carotid bruits. JVD not elevated. Lungs: Clear bilaterally to auscultation without wheezes, rales, or rhonchi. Breathing is unlabored. Heart: RRR with S1 S2. No murmurs, rubs, or gallops appreciated. Abdomen: Soft,  non-tender, non-distended with normoactive bowel sounds. No hepatomegaly. No rebound/guarding. No obvious abdominal masses. Msk:  Strength and tone appear normal for age. Extremities: No clubbing or cyanosis. No edema. Distal pedal pulses are 2+ and equal bilaterally. Neuro: Alert and oriented X 3. No facial asymmetry. No focal deficit. Moves all extremities spontaneously. Psych:  Responds to questions appropriately with a normal affect.   EKG:  The EKG was personally reviewed and demonstrates: NSR, 75 bpm, prior inferior and anterior infarct, baseline wandering, nonspecific st/t changes Telemetry:  Telemetry was personally reviewed and demonstrates: SR  Weights: [employer]   08/05/21 1829  Weight: 93.4 kg    Relevant CV Studies:  LHC 07/26/2021:     LPAV lesion is 100% stenosed.   Prox Cx to Mid Cx lesion is 90% stenosed.   Prox LAD to Mid LAD lesion is 95% stenosed.   1st Mrg lesion is 70% stenosed.   Origin to Prox Graft lesion is 100% stenosed.   Prox RCA to Mid RCA lesion is 100% stenosed.   Origin to Prox Graft lesion is 100% stenosed.   Mid LAD to Dist LAD lesion is 80% stenosed.   Dist LAD lesion is 60% stenosed.   SVG graft was visualized by angiography.   LIMA and is normal in caliber.   The graft exhibits no disease.   The left ventricular systolic function is normal.   LV end diastolic pressure is mildly elevated.   The left ventricular ejection fraction is 55-65% by visual estimate.   1.  Significant  underlying three-vessel coronary artery disease with patent LIMA to mid LAD and occluded SVG to distal LAD and SVG to OM1/OM 2.  RCA was not injected and known to be small nondominant and occluded in the midsegment. 2.  Normal LV systolic function mildly elevated left ventricular end-diastolic pressure.   Recommendations: Unfortunately, the LAD is significantly diseased after the LIMA anastomosis and likely not suitable for PCI given extension of disease to the apical LAD.  Left circumflex can be treated with PCI but at the bifurcation lesion will long segment of disease.  We will place the patient on clopidogrel and planned staged PCI at [facility]. __________  Carlton Adam MPI 07/13/2021:   Abnormal, probably low risk pharmacologic myocardial perfusion stress test.   There is a small in size, moderate to severe, partially reversible defect involving the apical inferior and mid inferolateral segments most consistent with scar and peri-infarct ischemia, though an element of artifact cannot be excluded.   Left ventricular function is normal (LVEF 60-65%).   Attenuation correction CT demonstrates post CABG findings, coronary artery calcification, and aortic atherosclerosis.   Compared to the prior study of 12/07/2018, mid/apical inferior/inferolateral defect is new. __________  2D echo 09/2020: 1. Left ventricular ejection fraction, by estimation, is 60 to 65%. The  left ventricle has normal function. The left ventricle has no regional  wall motion abnormalities. Left ventricular diastolic parameters are  consistent with Grade I diastolic  dysfunction (impaired relaxation).   2. Right ventricular systolic function is normal. The right ventricular  size is normal. There is normal pulmonary artery systolic pressure. The  estimated right ventricular systolic pressure is 0000000 mmHg.   3. Left atrial size was mildly dilated.   4. The mitral valve is normal in structure. Mild mitral valve  regurgitation.    5. The aortic valve is normal in structure. Aortic valve regurgitation is  mild.  __________   Carlton Adam MPI 11/2018: Pharmacological myocardial perfusion imaging  study with no significant  ischemia Normal wall motion, EF estimated at 66% No EKG changes concerning for ischemia at peak stress or in recovery. Nonspecific T wave ABN on resting EKG V3 to V6 Low risk scan    Laboratory Data:  Chemistry Recent Labs  Lab 08/05/21 1831 08/06/21 0614  NA 129* 131*  K 5.2* 4.9  CL 98 104  CO2 23 22  GLUCOSE 132* 102*  BUN 34* 27*  CREATININE 2.50* 1.67*  CALCIUM 8.7* 8.4*  GFRNONAA 26* 43*  ANIONGAP 8 5    No results for input(s): PROT, ALBUMIN, AST, ALT, ALKPHOS, BILITOT in the last 168 hours. Hematology Recent Labs  Lab 08/05/21 1831 08/06/21 0614  WBC 7.4 6.2  RBC 4.63 4.43  HGB 12.0* 11.4*  HCT 38.2* 35.6*  MCV 82.5 80.4  MCH 25.9* 25.7*  MCHC 31.4 32.0  RDW 14.6 14.6  PLT 249 219   Cardiac EnzymesNo results for input(s): TROPONINI in the last 168 hours. No results for input(s): TROPIPOC in the last 168 hours.  BNPNo results for input(s): BNP, PROBNP in the last 168 hours.  DDimer No results for input(s): DDIMER in the last 168 hours.  Radiology/Studies:  DG Chest 2 View  Result Date: 08/05/2021 IMPRESSION: No active cardiopulmonary disease. Electronically Signed   By: Donavan Foil M.D.   On: 08/05/2021 19:08    Assessment and Plan:   1. CAD s/p CABG with unstable angina: -Currently asymptomatic at rest -He has ruled out -Unfortunately, he does have AKI at this time, making repeat LHC not ideal as he is at increased risk for contrast-induced AKI -Will discuss timing of LHC, to be performed at [facility] with Dr. Fletcher Anon, likely early to mid next week -Start Imdur 30 mg -Continue PTA ASA, Plavix, Coreg, and Lipitor -If he remains asymptomatic, can plan for discharge today   2. AKI: -Poor fluid intake at home -Improving with IV hydration -Recommend BMP  early next week  3. HFpEF: -He is volume depleted with AKI -IV hydration as above -Lasix on hold  4. HTNL -BP well controlled -Continue Coreg and amlodipine -Imdur added as above  5. HLD: -Lipitor         For questions or updates, please contact [place] Please consult [email or web address] for contact info under Cardiology/STEMI.   Signed, Christell Faith, PA-C [place] Pager: [phone] 08/06/2021, 7:15 AM

## 2021-08-06 NOTE — Telephone Encounter (Signed)
Patient calling to discuss below .

## 2021-08-06 NOTE — TOC Initial Note (Signed)
Transition of Care (TOC) - Initial/Assessment Note    Patient Details  Name: Maleki Hippe. MRN: [id number] Date of Birth: [birth year]  Transition of [facility]) CM/SW Contact:    Allayne Butcher, RN Phone Number: 08/06/2021, 12:19 PM  Clinical Narrative:                   Transition of Care [facility]) Screening Note   Patient Details  Name: Neldon Shepard. Date of Birth: 1945-07-16   Transition of Care [facility]) CM/SW Contact:    Allayne Butcher, RN Phone Number: 08/06/2021, 12:19 PM    Transition of Care Department [facility]) has reviewed patient and no TOC needs have been identified at this time. We will continue to monitor patient advancement through interdisciplinary progression rounds. If new patient transition needs arise, please place a TOC consult.         Patient Goals and CMS Choice        Expected Discharge Plan and Services           Expected Discharge Date: 08/06/21                                    Prior Living Arrangements/Services                       Activities of Daily Living      Permission Sought/Granted                  Emotional Assessment              Admission diagnosis:  AKI (acute kidney injury) (HCC) [N17.9] Patient Active Problem List   Diagnosis Date Noted   AKI (acute kidney injury) (HCC) 08/05/2021   Abnormal nuclear stress test    Chronic diastolic CHF (congestive heart failure) (HCC) 05/03/2021   CAD (coronary artery disease) 05/03/2021   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) 05/03/2021   Elevated troponin 05/03/2021   Hyperglycemia 05/03/2021   Severe sepsis (HCC) 05/03/2021   Acute bronchitis 05/03/2021   History of total knee arthroplasty, right 01/18/2021   Chest pain 12/07/2018   Hyperlipidemia with target low density lipoprotein (LDL) cholesterol less than 70 mg/dL 76/81/1572   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   Coronary artery disease involving native  coronary artery with angina pectoris (HCC) 01/09/2017   Unstable angina (HCC)    Essential hypertension    Encounter for screening colonoscopy 09/21/2016   PCP:  [name], MD Pharmacy:   [phone], [place] - [address] [place] [address] Phone: [phone] Fax: [phone]  [facility] - [place], [place] - [address] [place] [address] Phone: [phone] Fax: [phone]     Social Determinants of Health (SDOH) Interventions    Readmission Risk Interventions No flowsheet data found.

## 2021-08-06 NOTE — Discharge Summary (Signed)
Physician Discharge Summary  Dale Cook. [id number] DOB: [birth year] DOA: 08/05/2021  PCP: Albina Billet, MD  Admit date: 08/05/2021 Discharge date: 08/06/2021  Admitted From: Home Disposition: Home  Recommendations for Outpatient Follow-up:  Follow up with PCP in 1-2 weeks Plan for cardiac catheterization at [facility] on Monday 2/13  Home Health: No Equipment/Devices: None  Discharge Condition: Stable CODE STATUS: Full Diet recommendation: Cardiac  Brief/Interim Summary: 76 y.o. African-American male with medical history significant for coronary artery disease, status post four-vessel CABG, status post PCI and stent, on 1/30 by Dr. Fletcher Anon, CHF, hypertension, GERD and dyslipidemia, who presented to the ER with onset of chest tightness mild in intensity and without radiation and dyspnea started today.  He had admits to dyspnea on exertion since his  recent PCI.  He denies any cough or wheezing.  No nausea or vomiting or abdominal pain.  He admits to urinary hesitancy and dysuria without frequency or urgency or hematuria or flank pain.  No fever or chills.  NSTEMI ruled out.  Patient maintained on telemetry monitor during course of admission.  No ischemic changes noted.  High-sensitivity troponin remained flat.  Seen in consultation by cardiology.  Dose of Imdur 30 mg daily added to medication regimen.  No other changes recommended.  Patient had some AKI presumably.  No azotemia.  Per discussion with cardiology we will hold diuretics until patient arrives at [facility] on Monday 2/13 for PCI evaluation.    Discharge Diagnoses:  Principal Problem:   AKI (acute kidney injury) ([place])  Chest pain CAD status post CABG and PCI Unclear etiology.  Presentation consistent with unstable angina.  Troponins flat.  Chest pain resolved at time of discharge.  Seen in consultation by cardiology.  Add Imdur 30 mg daily to home medication regimen.  Also recommend  discontinuation of Lasix in setting of AKI until patient is seen at [facility] on Monday 2/13 for PCI evaluation.  Discharged home in stable condition.  Acute kidney injury Suspect prerenal as it was associated with hyponatremia.  Likely hypovolemic.  Patient states he has not been drinking enough water.  Did receive normal saline during hospital admission.  Recovery of kidney function had been noted.  At time of discharge will hold home Lasix until seen in [facility] [place] on Monday 2/13.  Discharge Instructions  Discharge Instructions     Diet - low sodium heart healthy   Complete by: As directed    Increase activity slowly   Complete by: As directed       Allergies as of 08/06/2021   No Known Allergies      Medication List     STOP taking these medications    furosemide 20 MG tablet Commonly known as: LASIX       TAKE these medications    amLODipine 10 MG tablet Commonly known as: NORVASC TAKE 1 TABLET EVERY DAY   aspirin 81 MG chewable tablet Chew 81 mg by mouth daily.   atorvastatin 40 MG tablet Commonly known as: LIPITOR Take 40 mg by mouth daily.   carvedilol 25 MG tablet Commonly known as: COREG TAKE 1 TABLET TWICE DAILY   clopidogrel 75 MG tablet Commonly known as: Plavix Take 1 tablet (75 mg total) by mouth daily.   guaiFENesin 600 MG 12 hr tablet Commonly known as: MUCINEX Take 600 mg by mouth 2 (two) times daily as needed (congestion).   isosorbide mononitrate 30 MG 24 hr tablet Commonly known  as: IMDUR Take 1 tablet (30 mg total) by mouth daily. Start taking on: August 07, 2021   loratadine 10 MG tablet Commonly known as: CLARITIN Take 10 mg by mouth daily as needed for allergies.   meloxicam 15 MG tablet Commonly known as: MOBIC Take 15 mg by mouth daily as needed for pain.   moexipril 15 MG tablet Commonly known as: UNIVASC Take 15 mg by mouth at bedtime.   pantoprazole 40 MG tablet Commonly known as:  PROTONIX Take 1 tablet (40 mg total) by mouth daily.   sulfamethoxazole-trimethoprim 800-160 MG tablet Commonly known as: BACTRIM DS Take 1 tablet by mouth 2 (two) times daily.   tamsulosin 0.4 MG Caps capsule Commonly known as: FLOMAX Take 0.4 mg by mouth daily.        No Known Allergies  Consultations: Cardiology-[facility]   Procedures/Studies: DG Chest 2 View  Result Date: 08/05/2021 CLINICAL DATA:  Shortness of breath EXAM: CHEST - 2 VIEW COMPARISON:  05/02/2021 FINDINGS: Post sternotomy changes. No focal opacity or pleural effusion. Stable cardiomediastinal silhouette. No pneumothorax. IMPRESSION: No active cardiopulmonary disease. Electronically Signed   By: Donavan Foil M.D.   On: 08/05/2021 19:08   CARDIAC CATHETERIZATION  Result Date: 07/26/2021   LPAV lesion is 100% stenosed.   Prox Cx to Mid Cx lesion is 90% stenosed.   Prox LAD to Mid LAD lesion is 95% stenosed.   1st Mrg lesion is 70% stenosed.   Origin to Prox Graft lesion is 100% stenosed.   Prox RCA to Mid RCA lesion is 100% stenosed.   Origin to Prox Graft lesion is 100% stenosed.   Mid LAD to Dist LAD lesion is 80% stenosed.   Dist LAD lesion is 60% stenosed.   SVG graft was visualized by angiography.   LIMA and is normal in caliber.   The graft exhibits no disease.   The left ventricular systolic function is normal.   LV end diastolic pressure is mildly elevated.   The left ventricular ejection fraction is 55-65% by visual estimate. 1.  Significant underlying three-vessel coronary artery disease with patent LIMA to mid LAD and occluded SVG to distal LAD and SVG to OM1/OM 2.  RCA was not injected and known to be small nondominant and occluded in the midsegment. 2.  Normal LV systolic function mildly elevated left ventricular end-diastolic pressure. Recommendations: Unfortunately, the LAD is significantly diseased after the LIMA anastomosis and likely not suitable for PCI given extension of disease to the apical LAD.  Left  circumflex can be treated with PCI but at the bifurcation lesion will long segment of disease.  We will place the patient on clopidogrel and planned staged PCI at [facility].   NM Myocar Multi W/Spect W/Wall Motion / EF  Result Date: 07/13/2021   Abnormal, probably low risk pharmacologic myocardial perfusion stress test.   There is a small in size, moderate to severe, partially reversible defect involving the apical inferior and mid inferolateral segments most consistent with scar and peri-infarct ischemia, though an element of artifact cannot be excluded.   Left ventricular function is normal (LVEF 60-65%).   Attenuation correction CT demonstrates post CABG findings, coronary artery calcification, and aortic atherosclerosis.   Compared to the prior study of 12/07/2018, mid/apical inferior/inferolateral defect is new.      Subjective: Seen and examined at time of discharge.  Stable no distress.  Chest pain-free.  Stable for discharge home.  Discharge Exam: Vitals:   08/06/21 0845 08/06/21 1000  BP:  127/64 106/63  Pulse: 69 69  Resp: 18 20  Temp:  98.7 F (37.1 C)  SpO2: 96% 97%   Vitals:   08/06/21 0530 08/06/21 0700 08/06/21 0845 08/06/21 1000  BP: (!) 109/59 121/71 127/64 106/63  Pulse: 68 90 69 69  Resp: 18  18 20   Temp:  98.2 F (36.8 C)  98.7 F (37.1 C)  TempSrc:  Oral  Oral  SpO2: 96% 100% 96% 97%  Weight:      Height:        General: Pt is alert, awake, not in acute distress Cardiovascular: RRR, S1/S2 +, no rubs, no gallops Respiratory: CTA bilaterally, no wheezing, no rhonchi Abdominal: Soft, NT, ND, bowel sounds + Extremities: no edema, no cyanosis    The results of significant diagnostics from this hospitalization (including imaging, microbiology, ancillary and laboratory) are listed below for reference.     Microbiology: Recent Results (from the past 240 hour(s))  Resp Panel by RT-PCR (Flu A&B, Covid) Nasopharyngeal Swab     Status: None   Collection Time:  08/05/21  9:47 PM   Specimen: Nasopharyngeal Swab; Nasopharyngeal(NP) swabs in vial transport medium  Result Value Ref Range Status   SARS Coronavirus 2 by RT PCR NEGATIVE NEGATIVE Final    Comment: (NOTE) SARS-CoV-2 target nucleic acids are NOT DETECTED.  The SARS-CoV-2 RNA is generally detectable in upper respiratory specimens during the acute phase of infection. The lowest concentration of SARS-CoV-2 viral copies this assay can detect is 138 copies/mL. A negative result does not preclude SARS-Cov-2 infection and should not be used as the sole basis for treatment or other patient management decisions. A negative result may occur with  improper specimen collection/handling, submission of specimen other than nasopharyngeal swab, presence of viral mutation(s) within the areas targeted by this assay, and inadequate number of viral copies(<138 copies/mL). A negative result must be combined with clinical observations, patient history, and epidemiological information. The expected result is Negative.  Fact Sheet for Patients:  EntrepreneurPulse.com.au  Fact Sheet for Healthcare Providers:  IncredibleEmployment.be  This test is no t yet approved or cleared by the [place] FDA and  has been authorized for detection and/or diagnosis of SARS-CoV-2 by FDA under an Emergency Use Authorization (EUA). This EUA will remain  in effect (meaning this test can be used) for the duration of the COVID-19 declaration under Section 564(b)(1) of the Act, 21 U.S.C.section 360bbb-3(b)(1), unless the authorization is terminated  or revoked sooner.       Influenza A by PCR NEGATIVE NEGATIVE Final   Influenza B by PCR NEGATIVE NEGATIVE Final    Comment: (NOTE) The Xpert Xpress SARS-CoV-2/FLU/RSV plus assay is intended as an aid in the diagnosis of influenza from Nasopharyngeal swab specimens and should not be used as a sole basis for treatment. Nasal washings  and aspirates are unacceptable for Xpert Xpress SARS-CoV-2/FLU/RSV testing.  Fact Sheet for Patients: EntrepreneurPulse.com.au  Fact Sheet for Healthcare Providers: IncredibleEmployment.be  This test is not yet approved or cleared by the [place] FDA and has been authorized for detection and/or diagnosis of SARS-CoV-2 by FDA under an Emergency Use Authorization (EUA). This EUA will remain in effect (meaning this test can be used) for the duration of the COVID-19 declaration under Section 564(b)(1) of the Act, 21 U.S.C. section 360bbb-3(b)(1), unless the authorization is terminated or revoked.  Performed at [facility], [address] [place], [place] [address]      Labs: BNP (last 3 results) Recent Labs  09/19/20 2017 05/02/21 2200  BNP 211.7* [id number]   Basic Metabolic Panel: Recent Labs  Lab 08/05/21 1831 08/06/21 0614  NA 129* 131*  K 5.2* 4.9  CL 98 104  CO2 23 22  GLUCOSE 132* 102*  BUN 34* 27*  CREATININE 2.50* 1.67*  CALCIUM 8.7* 8.4*   Liver Function Tests: No results for input(s): AST, ALT, ALKPHOS, BILITOT, PROT, ALBUMIN in the last 168 hours. No results for input(s): LIPASE, AMYLASE in the last 168 hours. No results for input(s): AMMONIA in the last 168 hours. CBC: Recent Labs  Lab 08/05/21 1831 08/06/21 0614  WBC 7.4 6.2  HGB 12.0* 11.4*  HCT 38.2* 35.6*  MCV 82.5 80.4  PLT 249 219   Cardiac Enzymes: No results for input(s): CKTOTAL, CKMB, CKMBINDEX, TROPONINI in the last 168 hours. BNP: Invalid input(s): POCBNP CBG: No results for input(s): GLUCAP in the last 168 hours. D-Dimer No results for input(s): DDIMER in the last 72 hours. Hgb A1c No results for input(s): HGBA1C in the last 72 hours. Lipid Profile No results for input(s): CHOL, HDL, LDLCALC, TRIG, CHOLHDL, LDLDIRECT in the last 72 hours. Thyroid function studies No results for input(s): TSH, T4TOTAL, T3FREE, THYROIDAB in  the last 72 hours.  Invalid input(s): FREET3 Anemia work up No results for input(s): VITAMINB12, FOLATE, FERRITIN, TIBC, IRON, RETICCTPCT in the last 72 hours. Urinalysis    Component Value Date/Time   COLORURINE YELLOW (A) 01/08/2021 1120   APPEARANCEUR CLEAR (A) 01/08/2021 1120   LABSPEC 1.014 01/08/2021 1120   PHURINE 5.0 01/08/2021 1120   GLUCOSEU NEGATIVE 01/08/2021 1120   HGBUR NEGATIVE 01/08/2021 1120   BILIRUBINUR NEGATIVE 01/08/2021 1120   KETONESUR NEGATIVE 01/08/2021 1120   PROTEINUR NEGATIVE 01/08/2021 1120   NITRITE NEGATIVE 01/08/2021 1120   LEUKOCYTESUR NEGATIVE 01/08/2021 1120   Sepsis Labs Invalid input(s): PROCALCITONIN,  WBC,  LACTICIDVEN Microbiology Recent Results (from the past 240 hour(s))  Resp Panel by RT-PCR (Flu A&B, Covid) Nasopharyngeal Swab     Status: None   Collection Time: 08/05/21  9:47 PM   Specimen: Nasopharyngeal Swab; Nasopharyngeal(NP) swabs in vial transport medium  Result Value Ref Range Status   SARS Coronavirus 2 by RT PCR NEGATIVE NEGATIVE Final    Comment: (NOTE) SARS-CoV-2 target nucleic acids are NOT DETECTED.  The SARS-CoV-2 RNA is generally detectable in upper respiratory specimens during the acute phase of infection. The lowest concentration of SARS-CoV-2 viral copies this assay can detect is 138 copies/mL. A negative result does not preclude SARS-Cov-2 infection and should not be used as the sole basis for treatment or other patient management decisions. A negative result may occur with  improper specimen collection/handling, submission of specimen other than nasopharyngeal swab, presence of viral mutation(s) within the areas targeted by this assay, and inadequate number of viral copies(<138 copies/mL). A negative result must be combined with clinical observations, patient history, and epidemiological information. The expected result is Negative.  Fact Sheet for Patients:   EntrepreneurPulse.com.au  Fact Sheet for Healthcare Providers:  IncredibleEmployment.be  This test is no t yet approved or cleared by the [place] FDA and  has been authorized for detection and/or diagnosis of SARS-CoV-2 by FDA under an Emergency Use Authorization (EUA). This EUA will remain  in effect (meaning this test can be used) for the duration of the COVID-19 declaration under Section 564(b)(1) of the Act, 21 U.S.C.section 360bbb-3(b)(1), unless the authorization is terminated  or revoked sooner.       Influenza A by PCR NEGATIVE  NEGATIVE Final   Influenza B by PCR NEGATIVE NEGATIVE Final    Comment: (NOTE) The Xpert Xpress SARS-CoV-2/FLU/RSV plus assay is intended as an aid in the diagnosis of influenza from Nasopharyngeal swab specimens and should not be used as a sole basis for treatment. Nasal washings and aspirates are unacceptable for Xpert Xpress SARS-CoV-2/FLU/RSV testing.  Fact Sheet for Patients: EntrepreneurPulse.com.au  Fact Sheet for Healthcare Providers: IncredibleEmployment.be  This test is not yet approved or cleared by the [place] FDA and has been authorized for detection and/or diagnosis of SARS-CoV-2 by FDA under an Emergency Use Authorization (EUA). This EUA will remain in effect (meaning this test can be used) for the duration of the COVID-19 declaration under Section 564(b)(1) of the Act, 21 U.S.C. section 360bbb-3(b)(1), unless the authorization is terminated or revoked.  Performed at [facility], [address], [place], [place] [address]      Time coordinating discharge: Over 30 minutes  SIGNED:   Sidney Ace, MD  [facility] 08/06/2021, 2:33 PM Pager   If 7PM-7AM, please contact night-coverage

## 2021-08-06 NOTE — H&P (View-Only) (Signed)
Cardiology Consultation:   Patient ID: Dale Cook.; XO:[id number]; 04-03-1946   Admit date: 08/05/2021 Date of Consult: 08/06/2021  Primary Care Provider: Albina Billet, MD Primary Cardiologist: Fletcher Anon Primary Electrophysiologist:  None   Patient Profile:   Dale Coon. is a 76 y.o. male with a hx of CAD status post CABG in 12/2016, HFpEF, HTN, and HLD who is being seen today for the evaluation of chest pain at the request of Dr. Sidney Ace.  History of Present Illness:   Dale Cook presented in 2018 with unstable angina with LHC showing severe three-vessel CAD.  He subsequently underwent four-vessel CABG in 12/2016 with LIMA to mid LAD, SVG to distal LAD, jump graft with SVG to OM1 and OM 2.  Carotid artery ultrasound prior to surgery showed no significant disease.  Echo showed normal LV systolic function with no significant valvular abnormalities.  Lexiscan MPI on 07/13/2021 showed a small in size, moderate in severity, partially reversible defect involving the apical inferior and mid inferolateral segments most consistent with scar and peri-infarct ischemia.  Compared to prior study in 2020, the mid/apical inferior.inferolateral defect was new. LVEF normal. Due to persistent symptoms, he underwent LHC on 1/302/2023, that showed significant underlying 3-vessel CAD with patent LIMA to mid LAD, and occluded SVG to distal LAD and SVG to OM1/OM2. The RCA was not injected and known to be a small nondominant vessel and occluded in the mid segment.  There was normal LVSF and mildly elevated LVEDP. The LAD was significantly diseased after the LIMA anastomosis and likely not suitable for PCI given the extension of disease to the apical LAD. The LCx could be treated with PCI, but at the bifurcation lesion would require a long segment stenting with recommendation for staged PCI at [facility].    Since undergoing the above cath, he has noted progressive exertional dyspnea without frank angina.  No lower extremity swelling, abdominal distension, or orthopnea. His appetite has been down and he has not been drinking much water. He reports adherence to his medications. No fever, chills, or cough. No dizziness, presyncope, or syncope. No palpitations. Work up in the ED has shown largely stable vitals with relative hypotension noted. EKG demonstrated sinus rhythm with nonspecific st/t changes as below. CXR showed no acute process. High sensitivity troponin normal x 3. Covid and influenza negative. WBC 6.2, HGB 12, potassium 5.2 trending to 4.9, BUN/SCr 34/2.5 trending to 27/1.67 with a baseline around 0.8 to 1.2. He was given aspirin 324 mg and IV fluids in the ED. Upon admission, he was continued on PTA medications and cardiology was asked to see. Currently, asymptomatic.    Past Medical History:  Diagnosis Date   Arthritis    "hands, knees" (01/09/2017)   CAD (coronary artery disease)    a. 12/2016 Cath: LM nl, LAD 95p, 48m, 60/90d, LCX 80p/m, 179m, OM1 62m, OM2 90, RCA 99p, Ef 55-65%; b. 12/2016 CABG x 4 (LIMA->mLAD, VG->dLAD, VG->OM1->OM2); c. 11/2018 MV: EF 66%, no ischemia.   Cardiac murmur    Grade II/VI early peaking systolic in aortic area   CHF (congestive heart failure) (HCC)    Essential hypertension    GERD (gastroesophageal reflux disease)    High cholesterol    History of echocardiogram    a. 12/2016 Echo: EF 65-70%.   Left carotid bruit    Wears dentures    partial upper    Past Surgical History:  Procedure Laterality Date   CATARACT EXTRACTION W/PHACO  Left 06/12/2020   Procedure: CATARACT EXTRACTION PHACO AND INTRAOCULAR LENS PLACEMENT (IOC) LEFT 4.36 00:51.9 8.4%;  Surgeon: Leandrew Koyanagi, MD;  Location: [place];  Service: Ophthalmology;  Laterality: Left;   COLONOSCOPY WITH PROPOFOL N/A 11/09/2016   Procedure: COLONOSCOPY WITH PROPOFOL;  Surgeon: Robert Bellow, MD;  Location: [facility];  Service: Endoscopy;  Laterality: N/A;   COLONOSCOPY WITH  PROPOFOL N/A 11/29/2019   Procedure: COLONOSCOPY WITH PROPOFOL;  Surgeon: Robert Bellow, MD;  Location: [facility];  Service: Endoscopy;  Laterality: N/A;   CORONARY ARTERY BYPASS GRAFT N/A 01/12/2017   CABG x 4: LIMA to mLAD, SVG to dLAD, sequential SVG to OM1-OM2, EVH via left thigh and leg;  Surgeon: Rexene Alberts, MD;  Location: [place];  Service: Open Heart Surgery;  Laterality: N/A;   INTRAOPERATIVE TRANSESOPHAGEAL ECHOCARDIOGRAM N/A 01/12/2017   Procedure: INTRAOPERATIVE TRANSESOPHAGEAL ECHOCARDIOGRAM;  Surgeon: Rexene Alberts, MD;  Location: [place];  Service: Open Heart Surgery;  Laterality: N/A;   LEFT HEART CATH AND CORONARY ANGIOGRAPHY Left 01/09/2017   Procedure: Left Heart Cath and Coronary Angiography;  Surgeon: Wellington Hampshire, MD;  Location: [place] [facility];  Service: Cardiovascular;  Laterality: Left;   LEFT HEART CATH AND CORS/GRAFTS ANGIOGRAPHY N/A 07/26/2021   Procedure: LEFT HEART CATH AND CORS/GRAFTS ANGIOGRAPHY;  Surgeon: Wellington Hampshire, MD;  Location: [place] [facility];  Service: Cardiovascular;  Laterality: N/A;   TOTAL KNEE ARTHROPLASTY Right 01/18/2021   Procedure: TOTAL KNEE ARTHROPLASTY;  Surgeon: Lovell Sheehan, MD;  Location: [facility];  Service: Orthopedics;  Laterality: Right;     Home Meds: Prior to Admission medications   Medication Sig Start Date End Date Taking? Authorizing Provider  amLODipine (NORVASC) 10 MG tablet TAKE 1 TABLET EVERY DAY 04/19/21  Yes Wellington Hampshire, MD  aspirin 81 MG chewable tablet Chew 81 mg by mouth daily.   Yes [provider]  atorvastatin (LIPITOR) 40 MG tablet Take 40 mg by mouth daily.   Yes [provider]  carvedilol (COREG) 25 MG tablet TAKE 1 TABLET TWICE DAILY 02/24/21  Yes Wellington Hampshire, MD  clopidogrel (PLAVIX) 75 MG tablet Take 1 tablet (75 mg total) by mouth daily. 07/26/21 07/26/22 Yes Wellington Hampshire, MD  furosemide (LASIX) 20 MG tablet Take 1 tablet (20 mg total) by mouth  daily. 12/23/20  Yes Hackney, Tina A, FNP  moexipril (UNIVASC) 15 MG tablet Take 15 mg by mouth at bedtime.  10/24/18  Yes [provider]  pantoprazole (PROTONIX) 40 MG tablet Take 1 tablet (40 mg total) by mouth daily. 06/04/21  Yes Darric Plante, Areta Haber, PA-C  sulfamethoxazole-trimethoprim (BACTRIM DS) 800-160 MG tablet Take 1 tablet by mouth 2 (two) times daily. 07/28/21  Yes [provider]  tamsulosin (FLOMAX) 0.4 MG CAPS capsule Take 0.4 mg by mouth daily. 07/28/21  Yes [provider]  guaiFENesin (MUCINEX) 600 MG 12 hr tablet Take 600 mg by mouth 2 (two) times daily as needed (congestion).    [provider]  loratadine (CLARITIN) 10 MG tablet Take 10 mg by mouth daily as needed for allergies.    [provider]  meloxicam (MOBIC) 15 MG tablet Take 15 mg by mouth daily as needed for pain. 10/05/18   [provider]    Inpatient Medications: Scheduled Meds:  amLODipine  10 mg Oral Daily   aspirin  81 mg Oral Daily   atorvastatin  40 mg Oral Daily   carvedilol  25 mg  Oral BID   clopidogrel  75 mg Oral Daily   enoxaparin (LOVENOX) injection  40 mg Subcutaneous Q24H   pantoprazole  40 mg Oral Daily   Continuous Infusions:  sodium chloride 100 mL/hr at 08/06/21 0245   PRN Meds: acetaminophen **OR** acetaminophen, guaiFENesin, loratadine, magnesium hydroxide, morphine injection, nitroGLYCERIN, ondansetron **OR** ondansetron (ZOFRAN) IV, traZODone  Allergies:  No Known Allergies  Social History:   Social History   Socioeconomic History   Marital status: Divorced    Spouse name: Not on file   Number of children: 3   Years of education: Not on file   Highest education level: Not on file  Occupational History   Not on file  Tobacco Use   Smoking status: Never   Smokeless tobacco: Never  Vaping Use   Vaping Use: Never used  Substance and Sexual Activity   Alcohol use: No   Drug use: No   Sexual activity: Not Currently  Other Topics  Concern   Not on file  Social History Narrative   Not on file   Social Determinants of Health   Financial Resource Strain: Not on file  Food Insecurity: Not on file  Transportation Needs: Not on file  Physical Activity: Sufficiently Active   Days of Exercise per Week: 7 days   Minutes of Exercise per Session: 120 min  Stress: Not on file  Social Connections: Not on file  Intimate Partner Violence: Not on file     Family History:   Family History  Problem Relation Age of Onset   Heart attack Mother    Colon cancer Brother     ROS:  Review of Systems  Constitutional:  Positive for malaise/fatigue. Negative for chills, diaphoresis, fever and weight loss.  HENT:  Negative for congestion.   Eyes:  Negative for discharge and redness.  Respiratory:  Positive for shortness of breath. Negative for cough, sputum production and wheezing.   Cardiovascular:  Negative for chest pain, palpitations, orthopnea, claudication, leg swelling and PND.  Gastrointestinal:  Negative for abdominal pain, blood in stool, heartburn, melena, nausea and vomiting.  Musculoskeletal:  Negative for falls and myalgias.  Skin:  Negative for rash.  Neurological:  Negative for dizziness, tingling, tremors, sensory change, speech change, focal weakness, loss of consciousness and weakness.  Endo/Heme/Allergies:  Does not bruise/bleed easily.  Psychiatric/Behavioral:  Negative for substance abuse. The patient is not nervous/anxious.   All other systems reviewed and are negative.    Physical Exam/Data:   Vitals:   08/06/21 0400 08/06/21 0430 08/06/21 0511 08/06/21 0530  BP: 109/60 (!) 110/56 116/62 (!) 109/59  Pulse: 62 70 68 68  Resp:   18 18  Temp:      TempSrc:      SpO2: 91% 97% 94% 96%  Weight:      Height:        Intake/Output Summary (Last 24 hours) at 08/06/2021 0715 Last data filed at 08/06/2021 0657 Gross per 24 hour  Intake 990 ml  Output 1400 ml  Net -410 ml   Filed Weights   08/05/21  1829  Weight: 93.4 kg   Body mass index is 27.94 kg/m.   Physical Exam: General: Well developed, well nourished, in no acute distress. Head: Normocephalic, atraumatic, sclera non-icteric, no xanthomas, nares without discharge.  Neck: Negative for carotid bruits. JVD not elevated. Lungs: Clear bilaterally to auscultation without wheezes, rales, or rhonchi. Breathing is unlabored. Heart: RRR with S1 S2. No murmurs, rubs, or gallops appreciated. Abdomen: Soft,  non-tender, non-distended with normoactive bowel sounds. No hepatomegaly. No rebound/guarding. No obvious abdominal masses. Msk:  Strength and tone appear normal for age. Extremities: No clubbing or cyanosis. No edema. Distal pedal pulses are 2+ and equal bilaterally. Neuro: Alert and oriented X 3. No facial asymmetry. No focal deficit. Moves all extremities spontaneously. Psych:  Responds to questions appropriately with a normal affect.   EKG:  The EKG was personally reviewed and demonstrates: NSR, 75 bpm, prior inferior and anterior infarct, baseline wandering, nonspecific st/t changes Telemetry:  Telemetry was personally reviewed and demonstrates: SR  Weights: [employer]   08/05/21 1829  Weight: 93.4 kg    Relevant CV Studies:  LHC 07/26/2021:     LPAV lesion is 100% stenosed.   Prox Cx to Mid Cx lesion is 90% stenosed.   Prox LAD to Mid LAD lesion is 95% stenosed.   1st Mrg lesion is 70% stenosed.   Origin to Prox Graft lesion is 100% stenosed.   Prox RCA to Mid RCA lesion is 100% stenosed.   Origin to Prox Graft lesion is 100% stenosed.   Mid LAD to Dist LAD lesion is 80% stenosed.   Dist LAD lesion is 60% stenosed.   SVG graft was visualized by angiography.   LIMA and is normal in caliber.   The graft exhibits no disease.   The left ventricular systolic function is normal.   LV end diastolic pressure is mildly elevated.   The left ventricular ejection fraction is 55-65% by visual estimate.   1.  Significant  underlying three-vessel coronary artery disease with patent LIMA to mid LAD and occluded SVG to distal LAD and SVG to OM1/OM 2.  RCA was not injected and known to be small nondominant and occluded in the midsegment. 2.  Normal LV systolic function mildly elevated left ventricular end-diastolic pressure.   Recommendations: Unfortunately, the LAD is significantly diseased after the LIMA anastomosis and likely not suitable for PCI given extension of disease to the apical LAD.  Left circumflex can be treated with PCI but at the bifurcation lesion will long segment of disease.  We will place the patient on clopidogrel and planned staged PCI at [facility]. __________  Carlton Adam MPI 07/13/2021:   Abnormal, probably low risk pharmacologic myocardial perfusion stress test.   There is a small in size, moderate to severe, partially reversible defect involving the apical inferior and mid inferolateral segments most consistent with scar and peri-infarct ischemia, though an element of artifact cannot be excluded.   Left ventricular function is normal (LVEF 60-65%).   Attenuation correction CT demonstrates post CABG findings, coronary artery calcification, and aortic atherosclerosis.   Compared to the prior study of 12/07/2018, mid/apical inferior/inferolateral defect is new. __________  2D echo 09/2020: 1. Left ventricular ejection fraction, by estimation, is 60 to 65%. The  left ventricle has normal function. The left ventricle has no regional  wall motion abnormalities. Left ventricular diastolic parameters are  consistent with Grade I diastolic  dysfunction (impaired relaxation).   2. Right ventricular systolic function is normal. The right ventricular  size is normal. There is normal pulmonary artery systolic pressure. The  estimated right ventricular systolic pressure is 0000000 mmHg.   3. Left atrial size was mildly dilated.   4. The mitral valve is normal in structure. Mild mitral valve  regurgitation.    5. The aortic valve is normal in structure. Aortic valve regurgitation is  mild.  __________   Carlton Adam MPI 11/2018: Pharmacological myocardial perfusion imaging  study with no significant  ischemia Normal wall motion, EF estimated at 66% No EKG changes concerning for ischemia at peak stress or in recovery. Nonspecific T wave ABN on resting EKG V3 to V6 Low risk scan    Laboratory Data:  Chemistry Recent Labs  Lab 08/05/21 1831 08/06/21 0614  NA 129* 131*  K 5.2* 4.9  CL 98 104  CO2 23 22  GLUCOSE 132* 102*  BUN 34* 27*  CREATININE 2.50* 1.67*  CALCIUM 8.7* 8.4*  GFRNONAA 26* 43*  ANIONGAP 8 5    No results for input(s): PROT, ALBUMIN, AST, ALT, ALKPHOS, BILITOT in the last 168 hours. Hematology Recent Labs  Lab 08/05/21 1831 08/06/21 0614  WBC 7.4 6.2  RBC 4.63 4.43  HGB 12.0* 11.4*  HCT 38.2* 35.6*  MCV 82.5 80.4  MCH 25.9* 25.7*  MCHC 31.4 32.0  RDW 14.6 14.6  PLT 249 219   Cardiac EnzymesNo results for input(s): TROPONINI in the last 168 hours. No results for input(s): TROPIPOC in the last 168 hours.  BNPNo results for input(s): BNP, PROBNP in the last 168 hours.  DDimer No results for input(s): DDIMER in the last 168 hours.  Radiology/Studies:  DG Chest 2 View  Result Date: 08/05/2021 IMPRESSION: No active cardiopulmonary disease. Electronically Signed   By: Donavan Foil M.D.   On: 08/05/2021 19:08    Assessment and Plan:   1. CAD s/p CABG with unstable angina: -Currently asymptomatic at rest -He has ruled out -Unfortunately, he does have AKI at this time, making repeat LHC not ideal as he is at increased risk for contrast-induced AKI -Will discuss timing of LHC, to be performed at [facility] with Dr. Fletcher Anon, likely early to mid next week -Start Imdur 30 mg -Continue PTA ASA, Plavix, Coreg, and Lipitor -If he remains asymptomatic, can plan for discharge today   2. AKI: -Poor fluid intake at home -Improving with IV hydration -Recommend BMP  early next week  3. HFpEF: -He is volume depleted with AKI -IV hydration as above -Lasix on hold  4. HTNL -BP well controlled -Continue Coreg and amlodipine -Imdur added as above  5. HLD: -Lipitor         For questions or updates, please contact [place] Please consult [email or web address] for contact info under Cardiology/STEMI.   Signed, Christell Faith, PA-C [place] Pager: [phone] 08/06/2021, 7:15 AM

## 2021-08-06 NOTE — Telephone Encounter (Signed)
Secure chat  Dr. Okey Dupre: Dale Cook, you cathed Dale Cook in late January and were thinking of staged PCI to the LCx/OM at [facility].  He came in overnight with worsening shortness of breath that he thinks is his anginal equivalent.  He does not look to be volume up and actually had some AKI from possible dehydration.  His Tn are negative.  If you are okay with it, our plan is to add Imdur and set him up for PCI with you at [facility] next week.  We would repeat a BMP on Monday to make sure his creatinine is back at baseline.  Let us know what you think.  Thanks.  Dr. Kirke Corin: Yes, that sounds good.  I can do on Wednesday.

## 2021-08-06 NOTE — Care Management Obs Status (Signed)
MEDICARE OBSERVATION STATUS NOTIFICATION   Patient Details  Name: Dale Cook. MRN: [id number] Date of Birth: 01-Jul-1945   Medicare Observation Status Notification Given:  Yes    Allayne Butcher, RN 08/06/2021, 12:13 PM

## 2021-08-06 NOTE — Telephone Encounter (Signed)
Patients PCI scheduled on 08/11/20 @ 12:30 at [facility] with Dr. Fletcher Anon. Patient will come to the [facility] on 08/09/20 for repeat BMP. Adv the patient that based on the results he may need 4 hours of IV hydration prior to the procedure. Adv the patient that he will be called to confirm a arrival time  Patient give verbal pre-procedure instructions with verbalized understanding.   [name], [name] Dept: [phone] Loc: [phone]  Dale Cook.  08/06/2021  You are scheduled for a PCI on Wednesday, February 15 with Dr. Kathlyn [place].  1. Please arrive at the [facility] (Main Entrance A) at [facility]: [address] [place], [place] [address] at 12:30 PM (This time is two hours before your procedure to ensure your preparation). Free valet parking service is available.   Special note: Every effort is made to have your procedure done on time. Please understand that emergencies sometimes delay scheduled procedures.  2. Diet: Do not eat solid foods after midnight.  The patient may have clear liquids until 5am upon the day of the procedure.  3. Labs: You will need to have blood drawn on Monday, February 13 at [facility], Go to 1st desk on your right to register.  Address: [address] [place], [place] [address]  Open: 8am - 5pm  Phone: [phone]. You do not need to be fasting.  4. Medication instructions in preparation for your procedure:   Contrast Allergy: No    On the morning of your procedure, take your Aspirin  and any morning medicines NOT listed above.  You may use sips of water.  5. Plan for one night stay--bring personal belongings. 6. Bring a current list of your medications and current insurance cards. 7. You MUST have a responsible person to drive you home. 8. Someone MUST be with you the first  24 hours after you arrive home or your discharge will be delayed. 9. Please wear clothes that are easy to get on and off and wear slip-on shoes.  Thank you for allowing [place] to care for you!   -- [facility]

## 2021-08-06 NOTE — Care Management CC44 (Signed)
Condition Code 44 Documentation Completed  Patient Details  Name: Dale Cook. MRN: [id number] Date of Birth: [birth year]   Condition Code 44 given:  Yes Patient signature on Condition Code 44 notice:  Yes Documentation of 2 MD's agreement:  Yes Code 44 added to claim:  Yes    Allayne Butcher, RN 08/06/2021, 12:13 PM

## 2021-08-06 NOTE — Evaluation (Signed)
Physical Therapy Evaluation Patient Details Name: Dale Cook. MRN: [id number] DOB: 1945/09/22 Today's Date: 08/06/2021  History of Present Illness  Pt is a 76 y.o. African-American male with medical history significant for coronary artery disease, status post four-vessel CABG, status post PCI and stent, CHF, hypertension, GERD and dyslipidemia, who presented to the ER with onset of chest tightness mild in intensity and DOE.  MD assessement includes: chest pain concerning for unstable angina, AKI, and HTN. Per cardiology pt with significant underlying three-vessel coronary artery disease and is now scheduled for LHC at [facility]   Clinical Impression  Pt was pleasant and motivated to participate during the session and put forth good effort throughout. Pt Ind with good control and stability with all functional tasks.  Pt ambulated 150 feet and went up/down large box step x 4 with no rail available to hold demonstrating good control throughout.  Pt on room air without adverse symptoms with SpO2 and HR WNL.  Pt subjectively reported feeling much better with ambulation than yesterday in regards to SOB.  Pt reported no SOB with activity this date compared to feeling significantly SOB with ambulation yesterday.  No skilled PT needs identified with pt also stating that he feels that he is at his baseline functionally with no PT needs.  Will complete PT orders at this time but will reassess pt pending a change in status upon receipt of new PT orders.         Recommendations for follow up therapy are one component of a multi-disciplinary discharge planning process, led by the attending physician.  Recommendations may be updated based on patient status, additional functional criteria and insurance authorization.  Follow Up Recommendations No PT follow up    Assistance Recommended at Discharge None  Patient can return home with the following  Assist for transportation    Equipment  Recommendations None recommended by PT  Recommendations for Other Services       Functional Status Assessment Patient has not had a recent decline in their functional status     Precautions / Restrictions Precautions Precautions: Fall Restrictions Weight Bearing Restrictions: No      Mobility  Bed Mobility Overal bed mobility: Independent             General bed mobility comments: good speed and effort    Transfers Overall transfer level: Independent Equipment used: None               General transfer comment: Good control and stability    Ambulation/Gait Ambulation/Gait assistance: Independent Gait Distance (Feet): 150 Feet Assistive device: None Gait Pattern/deviations: Antalgic Gait velocity: decreased     General Gait Details: Mildly antalgic on the RLE with 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; pt steady with no LOB and with SpO2 and HR WNL  Stairs Stairs: Yes Stairs assistance: Modified independent (Device/Increase time)   Number of Stairs: 4 General stair comments: Pt able to ascend/descend box step in ED hall x 4 with hand on wall for support with good stability  Wheelchair Mobility    Modified Erhardt (Stroke Patients Only)       Balance Overall balance assessment: No apparent balance deficits (not formally assessed)                                           Pertinent Vitals/Pain Pain Assessment  Pain Assessment: No/denies pain    Home Living Family/patient expects to be discharged to:: Private residence Living Arrangements: Alone Available Help at Discharge: Family;Available PRN/intermittently Type of Home: Apartment Home Access: Stairs to enter Entrance Stairs-Rails: Can reach both;Left;Right Entrance Stairs-Number of Steps: 17   Home Layout: One level Home Equipment: Cane - single Librarian, academic (2 wheels) Additional Comments: Son can assist PRN    Prior Function Prior  Level of Function : Independent/Modified Independent             Mobility Comments: Ind amb community distances without an AD, no fall history ADLs Comments: Ind with ADLs     Hand Dominance   Dominant Hand: Right    Extremity/Trunk Assessment   Upper Extremity Assessment Upper Extremity Assessment: Overall WFL for tasks assessed    Lower Extremity Assessment Lower Extremity Assessment: Overall WFL for tasks assessed       Communication   Communication: No difficulties  Cognition Arousal/Alertness: Awake/alert Behavior During Therapy: WFL for tasks assessed/performed Overall Cognitive Status: Within Functional Limits for tasks assessed                                          General Comments      Exercises     Assessment/Plan    PT Assessment Patient does not need any further PT services  PT Problem List         PT Treatment Interventions      PT Goals (Current goals can be found in the Care Plan section)  Acute Rehab PT Goals PT Goal Formulation: All assessment and education complete, DC therapy    Frequency       Co-evaluation               AM-PAC PT "6 Clicks" Mobility  Outcome Measure Help needed turning from your back to your side while in a flat bed without using bedrails?: None Help needed moving from lying on your back to sitting on the side of a flat bed without using bedrails?: None Help needed moving to and from a bed to a chair (including a wheelchair)?: None Help needed standing up from a chair using your arms (e.g., wheelchair or bedside chair)?: None Help needed to walk in hospital room?: None Help needed climbing 3-5 steps with a railing? : None 6 Click Score: 24    End of Session Equipment Utilized During Treatment: Gait belt Activity Tolerance: Patient tolerated treatment well Patient left: in bed;with call bell/phone within reach Nurse Communication: Mobility status PT Visit Diagnosis: Difficulty in  walking, not elsewhere classified (R26.2)    Time: 8768-1157 PT Time Calculation (min) (ACUTE ONLY): 21 min   Charges:   PT Evaluation $PT Eval Low Complexity: 1 Low          D. Scott Saidi Santacroce PT, DPT 08/06/21, 10:22 AM

## 2021-08-06 NOTE — Telephone Encounter (Signed)
Called the patient. Lmtcb. 

## 2021-08-06 NOTE — Telephone Encounter (Signed)
Patient returning call.

## 2021-08-09 ENCOUNTER — Other Ambulatory Visit
Admission: RE | Admit: 2021-08-09 | Discharge: 2021-08-09 | Disposition: A | Discharge status: Home / Self Care | Payer: Medicare HMO | Source: Ambulatory Visit | Attending: Cardiovascular Disease | Admitting: Cardiovascular Disease

## 2021-08-09 DIAGNOSIS — I25708 Atherosclerosis of coronary artery bypass graft(s), unspecified, with other forms of angina pectoris: Secondary | ICD-10-CM | POA: Insufficient documentation

## 2021-08-09 LAB — BASIC METABOLIC PANEL
Anion gap: 5 (ref 5–15)
BUN: 24 mg/dL — ABNORMAL HIGH (ref 8–23)
CO2: 22 mmol/L (ref 22–32)
Calcium: 8.8 mg/dL — ABNORMAL LOW (ref 8.9–10.3)
Chloride: 104 mmol/L (ref 98–111)
Creatinine, Ser: 1.79 mg/dL — ABNORMAL HIGH (ref 0.61–1.24)
GFR, Estimated: 40 mL/min — ABNORMAL LOW (ref >60)
Glucose, Bld: 122 mg/dL — ABNORMAL HIGH (ref 70–99)
Potassium: 4.9 mmol/L (ref 3.5–5.1)
Sodium: 131 mmol/L — ABNORMAL LOW (ref 135–145)

## 2021-08-10 ENCOUNTER — Other Ambulatory Visit: Payer: Medicare HMO

## 2021-08-10 NOTE — Telephone Encounter (Addendum)
Patient made aware of lab results and Dr. Jari Sportsman recommendation. Patient will stop his moexipril and increase his fluid intake. Adv the patient that he is to arrive at [facility] tomorrow 08/11/21 @ 7:30 am to allow for 4 full hours of hydration prior to his scheduled 12:30pm procedure. Adv the patient that the previous given pre-procedure instructions still apply. Patient verbalized understanding and has no  questions or concerns at this time.

## 2021-08-10 NOTE — Telephone Encounter (Signed)
-----   Message from Iran Ouch, MD sent at 08/09/2021  5:31 PM EST ----- His creatinine is still elevated.  Recommend stopping Moexipril, increase fluid intake and arrange for 4 hours of hydration before PCI on Wednesday.

## 2021-08-11 ENCOUNTER — Ambulatory Visit (HOSPITAL_COMMUNITY)
Admission: RE | Admit: 2021-08-11 | Discharge: 2021-08-12 | Disposition: A | Discharge status: Home / Self Care | Payer: Medicare HMO | Attending: Cardiovascular Disease | Admitting: Cardiovascular Disease

## 2021-08-11 ENCOUNTER — Encounter (HOSPITAL_COMMUNITY)
Admission: RE | Disposition: A | Discharge status: Home / Self Care | Payer: Self-pay | Source: Home / Self Care | Attending: Cardiovascular Disease

## 2021-08-11 ENCOUNTER — Encounter (HOSPITAL_COMMUNITY): Payer: Self-pay | Admitting: Cardiovascular Disease

## 2021-08-11 ENCOUNTER — Other Ambulatory Visit: Payer: Self-pay

## 2021-08-11 ENCOUNTER — Ambulatory Visit: Payer: Medicare HMO | Admitting: Physician Assistant

## 2021-08-11 DIAGNOSIS — Z951 Presence of aortocoronary bypass graft: Secondary | ICD-10-CM | POA: Diagnosis not present

## 2021-08-11 DIAGNOSIS — N179 Acute kidney failure, unspecified: Secondary | ICD-10-CM | POA: Insufficient documentation

## 2021-08-11 DIAGNOSIS — I110 Hypertensive heart disease with heart failure: Secondary | ICD-10-CM | POA: Insufficient documentation

## 2021-08-11 DIAGNOSIS — Z20822 Contact with and (suspected) exposure to covid-19: Secondary | ICD-10-CM | POA: Insufficient documentation

## 2021-08-11 DIAGNOSIS — I208 Other forms of angina pectoris: Secondary | ICD-10-CM | POA: Diagnosis present

## 2021-08-11 DIAGNOSIS — I25118 Atherosclerotic heart disease of native coronary artery with other forms of angina pectoris: Secondary | ICD-10-CM | POA: Insufficient documentation

## 2021-08-11 DIAGNOSIS — I5030 Unspecified diastolic (congestive) heart failure: Secondary | ICD-10-CM | POA: Diagnosis not present

## 2021-08-11 DIAGNOSIS — E7800 Pure hypercholesterolemia, unspecified: Secondary | ICD-10-CM | POA: Diagnosis not present

## 2021-08-11 DIAGNOSIS — Z7902 Long term (current) use of antithrombotics/antiplatelets: Secondary | ICD-10-CM | POA: Diagnosis not present

## 2021-08-11 DIAGNOSIS — Z955 Presence of coronary angioplasty implant and graft: Secondary | ICD-10-CM

## 2021-08-11 DIAGNOSIS — Z79899 Other long term (current) drug therapy: Secondary | ICD-10-CM | POA: Insufficient documentation

## 2021-08-11 HISTORY — PX: INTRAVASCULAR ULTRASOUND/IVUS: CATH118244

## 2021-08-11 HISTORY — PX: CORONARY STENT INTERVENTION: CATH118234

## 2021-08-11 LAB — SARS CORONAVIRUS 2 (TAT 6-24 HRS): SARS Coronavirus 2: NEGATIVE

## 2021-08-11 LAB — POCT ACTIVATED CLOTTING TIME: Activated Clotting Time: 335 seconds

## 2021-08-11 SURGERY — CORONARY STENT INTERVENTION
Anesthesia: LOCAL

## 2021-08-11 MED ORDER — SODIUM CHLORIDE 0.9 % IV SOLN: 250.0000 mL | INTRAVENOUS | Status: DC | PRN

## 2021-08-11 MED ORDER — ASPIRIN 81 MG PO CHEW
81.0000 mg | CHEWABLE_TABLET | Freq: Every day | ORAL | Status: DC
  Administered 2021-08-12: 81 mg via ORAL
  Filled 2021-08-11: qty 1

## 2021-08-11 MED ORDER — CLOPIDOGREL BISULFATE 75 MG PO TABS
75.0000 mg | ORAL_TABLET | Freq: Every day | ORAL | Status: DC
  Administered 2021-08-12: 75 mg via ORAL
  Filled 2021-08-11: qty 1

## 2021-08-11 MED ORDER — PANTOPRAZOLE SODIUM 40 MG PO TBEC
40.0000 mg | DELAYED_RELEASE_TABLET | Freq: Every day | ORAL | Status: DC
  Administered 2021-08-12: 40 mg via ORAL
  Filled 2021-08-11: qty 1

## 2021-08-11 MED ORDER — TAMSULOSIN HCL 0.4 MG PO CAPS
0.4000 mg | ORAL_CAPSULE | Freq: Every day | ORAL | Status: DC
  Administered 2021-08-12: 0.4 mg via ORAL
  Filled 2021-08-11: qty 1

## 2021-08-11 MED ORDER — ASPIRIN 81 MG PO CHEW: 81.0000 mg | CHEWABLE_TABLET | ORAL | Status: DC

## 2021-08-11 MED ORDER — SODIUM CHLORIDE 0.9% FLUSH: 3.0000 mL | INTRAVENOUS | Status: DC | PRN

## 2021-08-11 MED ORDER — HEPARIN (PORCINE) IN NACL 1000-0.9 UT/500ML-% IV SOLN
INTRAVENOUS | Status: DC | PRN
  Administered 2021-08-11 (×2): 500 mL

## 2021-08-11 MED ORDER — SODIUM CHLORIDE 0.9 % WEIGHT BASED INFUSION: 1.0000 mL/kg/h | INTRAVENOUS | Status: DC

## 2021-08-11 MED ORDER — HEPARIN (PORCINE) IN NACL 1000-0.9 UT/500ML-% IV SOLN
INTRAVENOUS | Status: AC
  Filled 2021-08-11: qty 500

## 2021-08-11 MED ORDER — VERAPAMIL HCL 2.5 MG/ML IV SOLN
INTRAVENOUS | Status: AC
  Filled 2021-08-11: qty 2

## 2021-08-11 MED ORDER — HEPARIN SODIUM (PORCINE) 1000 UNIT/ML IJ SOLN
INTRAMUSCULAR | Status: AC
  Filled 2021-08-11: qty 10

## 2021-08-11 MED ORDER — CLOPIDOGREL BISULFATE 300 MG PO TABS
ORAL_TABLET | ORAL | Status: DC | PRN
  Administered 2021-08-11: 300 mg via ORAL

## 2021-08-11 MED ORDER — NITROGLYCERIN 1 MG/10 ML FOR IR/CATH LAB
INTRA_ARTERIAL | Status: AC
  Filled 2021-08-11: qty 10

## 2021-08-11 MED ORDER — GUAIFENESIN ER 600 MG PO TB12: 600.0000 mg | ORAL_TABLET | Freq: Two times a day (BID) | ORAL | Status: DC | PRN

## 2021-08-11 MED ORDER — ACETAMINOPHEN 325 MG PO TABS: 650.0000 mg | ORAL_TABLET | ORAL | Status: DC | PRN

## 2021-08-11 MED ORDER — ONDANSETRON HCL 4 MG/2ML IJ SOLN: 4.0000 mg | Freq: Four times a day (QID) | INTRAMUSCULAR | Status: DC | PRN

## 2021-08-11 MED ORDER — FENTANYL CITRATE (PF) 100 MCG/2ML IJ SOLN
INTRAMUSCULAR | Status: DC | PRN
  Administered 2021-08-11: 50 ug via INTRAVENOUS

## 2021-08-11 MED ORDER — NITROGLYCERIN 1 MG/10 ML FOR IR/CATH LAB
INTRA_ARTERIAL | Status: DC | PRN
  Administered 2021-08-11: 200 ug via INTRACORONARY

## 2021-08-11 MED ORDER — LIDOCAINE HCL (PF) 1 % IJ SOLN
INTRAMUSCULAR | Status: DC | PRN
  Administered 2021-08-11: 2 mL

## 2021-08-11 MED ORDER — HEPARIN SODIUM (PORCINE) 1000 UNIT/ML IJ SOLN
INTRAMUSCULAR | Status: DC | PRN
  Administered 2021-08-11: 9000 [IU] via INTRAVENOUS

## 2021-08-11 MED ORDER — SODIUM CHLORIDE 0.9% FLUSH
3.0000 mL | Freq: Two times a day (BID) | INTRAVENOUS | Status: DC
  Administered 2021-08-11 (×2): 3 mL via INTRAVENOUS

## 2021-08-11 MED ORDER — MIDAZOLAM HCL 2 MG/2ML IJ SOLN
INTRAMUSCULAR | Status: DC | PRN
  Administered 2021-08-11: 1 mg via INTRAVENOUS

## 2021-08-11 MED ORDER — SODIUM CHLORIDE 0.9% FLUSH
3.0000 mL | Freq: Two times a day (BID) | INTRAVENOUS | Status: DC
  Administered 2021-08-11: 3 mL via INTRAVENOUS

## 2021-08-11 MED ORDER — AMLODIPINE BESYLATE 10 MG PO TABS
10.0000 mg | ORAL_TABLET | Freq: Every day | ORAL | Status: DC
  Administered 2021-08-12: 10 mg via ORAL
  Filled 2021-08-11: qty 1

## 2021-08-11 MED ORDER — LABETALOL HCL 5 MG/ML IV SOLN: 10.0000 mg | INTRAVENOUS | Status: AC | PRN

## 2021-08-11 MED ORDER — CARVEDILOL 25 MG PO TABS
25.0000 mg | ORAL_TABLET | Freq: Two times a day (BID) | ORAL | Status: DC
  Administered 2021-08-11 – 2021-08-12 (×2): 25 mg via ORAL
  Filled 2021-08-11 (×2): qty 1

## 2021-08-11 MED ORDER — LIDOCAINE HCL (PF) 1 % IJ SOLN
INTRAMUSCULAR | Status: AC
  Filled 2021-08-11: qty 30

## 2021-08-11 MED ORDER — CLOPIDOGREL BISULFATE 75 MG PO TABS: 75.0000 mg | ORAL_TABLET | ORAL | Status: DC

## 2021-08-11 MED ORDER — MIDAZOLAM HCL 2 MG/2ML IJ SOLN
INTRAMUSCULAR | Status: AC
  Filled 2021-08-11: qty 2

## 2021-08-11 MED ORDER — LORATADINE 10 MG PO TABS: 10.0000 mg | ORAL_TABLET | Freq: Every day | ORAL | Status: DC | PRN

## 2021-08-11 MED ORDER — SODIUM CHLORIDE 0.9 % WEIGHT BASED INFUSION
3.0000 mL/kg/h | INTRAVENOUS | Status: DC
  Administered 2021-08-11: 3 mL/kg/h via INTRAVENOUS

## 2021-08-11 MED ORDER — IOHEXOL 350 MG/ML SOLN
INTRAVENOUS | Status: DC | PRN
  Administered 2021-08-11: 95 mL

## 2021-08-11 MED ORDER — FENTANYL CITRATE (PF) 100 MCG/2ML IJ SOLN
INTRAMUSCULAR | Status: AC
  Filled 2021-08-11: qty 2

## 2021-08-11 MED ORDER — VERAPAMIL HCL 2.5 MG/ML IV SOLN
INTRAVENOUS | Status: DC | PRN
  Administered 2021-08-11: 10 mL via INTRA_ARTERIAL

## 2021-08-11 MED ORDER — ISOSORBIDE MONONITRATE ER 30 MG PO TB24
30.0000 mg | ORAL_TABLET | Freq: Every day | ORAL | Status: DC
  Administered 2021-08-12: 30 mg via ORAL
  Filled 2021-08-11: qty 1

## 2021-08-11 MED ORDER — SODIUM CHLORIDE 0.9 % IV SOLN: INTRAVENOUS | Status: AC

## 2021-08-11 MED ORDER — CLOPIDOGREL BISULFATE 300 MG PO TABS
ORAL_TABLET | ORAL | Status: AC
  Filled 2021-08-11: qty 1

## 2021-08-11 MED ORDER — ATORVASTATIN CALCIUM 40 MG PO TABS
40.0000 mg | ORAL_TABLET | Freq: Every day | ORAL | Status: DC
  Administered 2021-08-12: 40 mg via ORAL
  Filled 2021-08-11: qty 1

## 2021-08-11 SURGICAL SUPPLY — 22 items
BALL SAPPHIRE NC24 2.75X22 (BALLOONS) ×2
BALLN SAPPHIRE 2.5X20 (BALLOONS) ×2
BALLN SAPPHIRE ~~LOC~~ 3.5X12 (BALLOONS) ×1 IMPLANT
BALLOON SAPPHIRE 2.5X20 (BALLOONS) IMPLANT
BALLOON SAPPHIRE NC24 2.75X22 (BALLOONS) IMPLANT
CATH LAUNCHER 6FR EBU3.5 (CATHETERS) ×1 IMPLANT
CATH OPTICROSS HD (CATHETERS) ×1 IMPLANT
DEVICE RAD COMP TR BAND LRG (VASCULAR PRODUCTS) ×1 IMPLANT
GLIDESHEATH SLEND SS 6F .021 (SHEATH) ×1 IMPLANT
GUIDEWIRE INQWIRE 1.5J.035X260 (WIRE) IMPLANT
INQWIRE 1.5J .035X260CM (WIRE) ×2
KIT ENCORE 26 ADVANTAGE (KITS) ×1 IMPLANT
KIT HEART LEFT (KITS) ×2 IMPLANT
PACK CARDIAC CATHETERIZATION (CUSTOM PROCEDURE TRAY) ×2 IMPLANT
SLED PULL BACK IVUS (MISCELLANEOUS) ×1 IMPLANT
STENT SYNERGY XD 2.50X48 (Permanent Stent) IMPLANT
SYNERGY XD 2.50X48 (Permanent Stent) ×2 IMPLANT
TRANSDUCER W/STOPCOCK (MISCELLANEOUS) ×2 IMPLANT
TUBING ART PRESS 72  MALE/FEM (TUBING) ×1
TUBING ART PRESS 72 MALE/FEM (TUBING) IMPLANT
TUBING CIL FLEX 10 FLL-RA (TUBING) ×2 IMPLANT
WIRE RUNTHROUGH .014X180CM (WIRE) ×1 IMPLANT

## 2021-08-11 NOTE — Interval H&P Note (Signed)
Cath Lab Visit (complete for each Cath Lab visit)  Clinical Evaluation Leading to the Procedure:   ACS: No.  Non-ACS:    Anginal Classification: CCS III  Anti-ischemic medical therapy: Maximal Therapy (2 or more classes of medications)  Non-Invasive Test Results: No non-invasive testing performed  Prior CABG: Previous CABG      History and Physical Interval Note:  08/11/2021 1:16 PM  Dale Cook.  has presented today for surgery, with the diagnosis of CAD.  The various methods of treatment have been discussed with the patient and family. After consideration of risks, benefits and other options for treatment, the patient has consented to  Procedure(s): CORONARY STENT INTERVENTION (N/A) as a surgical intervention.  The patient's history has been reviewed, patient examined, no change in status, stable for surgery.  I have reviewed the patient's chart and labs.  Questions were answered to the patient's satisfaction.     Kathlyn Sacramento

## 2021-08-12 ENCOUNTER — Encounter (HOSPITAL_COMMUNITY): Payer: Self-pay | Admitting: Cardiovascular Disease

## 2021-08-12 DIAGNOSIS — I110 Hypertensive heart disease with heart failure: Secondary | ICD-10-CM | POA: Diagnosis not present

## 2021-08-12 DIAGNOSIS — Z20822 Contact with and (suspected) exposure to covid-19: Secondary | ICD-10-CM | POA: Diagnosis not present

## 2021-08-12 DIAGNOSIS — I25118 Atherosclerotic heart disease of native coronary artery with other forms of angina pectoris: Secondary | ICD-10-CM | POA: Diagnosis not present

## 2021-08-12 DIAGNOSIS — I208 Other forms of angina pectoris: Secondary | ICD-10-CM | POA: Diagnosis not present

## 2021-08-12 DIAGNOSIS — I5030 Unspecified diastolic (congestive) heart failure: Secondary | ICD-10-CM | POA: Diagnosis not present

## 2021-08-12 LAB — CBC
HCT: 36.5 % — ABNORMAL LOW (ref 39.0–52.0)
Hemoglobin: 11.7 g/dL — ABNORMAL LOW (ref 13.0–17.0)
MCH: 26.1 pg (ref 26.0–34.0)
MCHC: 32.1 g/dL (ref 30.0–36.0)
MCV: 81.5 fL (ref 80.0–100.0)
Platelets: 227 10*3/uL (ref 150–400)
RBC: 4.48 MIL/uL (ref 4.22–5.81)
RDW: 14.8 % (ref 11.5–15.5)
WBC: 6.9 10*3/uL (ref 4.0–10.5)
nRBC: 0 % (ref 0.0–0.2)

## 2021-08-12 LAB — BASIC METABOLIC PANEL
Anion gap: 8 (ref 5–15)
BUN: 16 mg/dL (ref 8–23)
CO2: 22 mmol/L (ref 22–32)
Calcium: 8.8 mg/dL — ABNORMAL LOW (ref 8.9–10.3)
Chloride: 104 mmol/L (ref 98–111)
Creatinine, Ser: 1.23 mg/dL (ref 0.61–1.24)
GFR, Estimated: >60 mL/min (ref >60)
Glucose, Bld: 101 mg/dL — ABNORMAL HIGH (ref 70–99)
Potassium: 4.7 mmol/L (ref 3.5–5.1)
Sodium: 134 mmol/L — ABNORMAL LOW (ref 135–145)

## 2021-08-12 MED ORDER — ACETAMINOPHEN 325 MG PO TABS: 650.0000 mg | ORAL_TABLET | ORAL | Status: AC | PRN

## 2021-08-12 NOTE — Progress Notes (Addendum)
Progress Note  Patient Name: Dale Cook. Date of Encounter: 08/12/2021  [facility] Cardiologist: Kathlyn Sacramento, MD [facility])  Subjective   No chest pain or shortness of breath, did well with ambulation.  Needs a knee replacement  Inpatient Medications    Scheduled Meds:  amLODipine  10 mg Oral Daily   aspirin  81 mg Oral Daily   atorvastatin  40 mg Oral Daily   carvedilol  25 mg Oral BID   clopidogrel  75 mg Oral Daily   isosorbide mononitrate  30 mg Oral Daily   pantoprazole  40 mg Oral Daily   sodium chloride flush  3 mL Intravenous Q12H   sodium chloride flush  3 mL Intravenous Q12H   tamsulosin  0.4 mg Oral Daily   Continuous Infusions:  sodium chloride     PRN Meds: sodium chloride, acetaminophen, guaiFENesin, loratadine, ondansetron (ZOFRAN) IV, sodium chloride flush   Vital Signs    Vitals:   08/11/21 2201 08/11/21 2355 08/12/21 0352 08/12/21 0747  BP: 109/82 (!) 137/57 129/66 135/69  Pulse: (!) 59 65 61 (!) 59  Resp:    16  Temp: 97.6 F (36.4 C) 98.6 F (37 C) 98.6 F (37 C) 97.9 F (36.6 C)  TempSrc: Axillary Axillary Axillary Oral  SpO2: 98% 96% 99% 99%  Weight:      Height:        Intake/Output Summary (Last 24 hours) at 08/12/2021 0754 Last data filed at 08/12/2021 0726 Gross per 24 hour  Intake 1574.87 ml  Output 3030 ml  Net -1455.13 ml   Last 3 Weights 08/11/2021 08/05/2021 07/26/2021  Weight (lbs) 206 lb 206 lb 206 lb  Weight (kg) 93.441 kg 93.441 kg 93.441 kg      Telemetry    Sinus rhythm- Personally Reviewed  ECG    02/16 ECG is SR, HR 62 inf Q waves were seen on prev ECG - Personally Reviewed  Physical Exam   GEN: No acute distress.   Neck: No JVD Cardiac: RRR, soft murmur, no rubs, or gallops.  Respiratory: Clear to auscultation bilaterally. GI: Soft, nontender, non-distended  MS: No edema; No deformity.  Right radial cath site without ecchymosis, hematoma Neuro:  Nonfocal  Psych: Normal affect   Labs     High Sensitivity Troponin:   Recent Labs  Lab 08/05/21 1831 08/05/21 2031 08/06/21 0614  TROPONINIHS 6 5 6      Chemistry Recent Labs  Lab 08/06/21 0614 08/09/21 0738 08/12/21 0414  NA 131* 131* 134*  K 4.9 4.9 4.7  CL 104 104 104  CO2 22 22 22   GLUCOSE 102* 122* 101*  BUN 27* 24* 16  CREATININE 1.67* 1.79* 1.23  CALCIUM 8.4* 8.8* 8.8*  GFRNONAA 43* 40* >60  ANIONGAP 5 5 8     Lipids No results for input(s): CHOL, TRIG, HDL, LABVLDL, LDLCALC, CHOLHDL in the last 168 hours.  Hematology Recent Labs  Lab 08/05/21 1831 08/06/21 0614 08/12/21 0414  WBC 7.4 6.2 6.9  RBC 4.63 4.43 4.48  HGB 12.0* 11.4* 11.7*  HCT 38.2* 35.6* 36.5*  MCV 82.5 80.4 81.5  MCH 25.9* 25.7* 26.1  MCHC 31.4 32.0 32.1  RDW 14.6 14.6 14.8  PLT 249 219 227   Thyroid No results for input(s): TSH, FREET4 in the last 168 hours.  BNPNo results for input(s): BNP, PROBNP in the last 168 hours.  DDimer No results for input(s): DDIMER in the last 168 hours.   Radiology    CARDIAC CATHETERIZATION  Result Date: 08/11/2021   LPAV lesion is 100% stenosed.   Prox LAD to Mid LAD lesion is 95% stenosed.   Mid Cx to Dist Cx lesion is 95% stenosed.   2nd Mrg lesion is 60% stenosed.   A drug-eluting stent was successfully placed using a SYNERGY XD 2.50X48.   Post intervention, there is a 0% residual stenosis. Successful IVUS guided angioplasty and drug-eluting stent placement to the left circumflex using 1 long stent.  Significant vessel size mismatch between the distal and the proximal area.  Distal segment was 2.5 mm in diameter in the proximal area was 3.5 mm in diameter.  The stent was postdilated distally with a 2.75 noncompliant balloon and proximally to 3.5 noncompliant balloon to the maximal stent ID which is 3.5 mm. OM2 has borderline disease proximally but is supplied by good flow from the Y graft from OM 3. Recommendations: Continue dual antiplatelet therapy for at least 6 months. Aggressive treatment of  risk factors. Given underlying chronic kidney disease, will hydrate overnight and recheck renal function in the morning.    Cardiac Studies   PCI: 08/11/2021   LPAV lesion is 100% stenosed.   Prox LAD to Mid LAD lesion is 95% stenosed.   Mid Cx to Dist Cx lesion is 95% stenosed.   2nd Mrg lesion is 60% stenosed.   A drug-eluting stent was successfully placed using a SYNERGY XD 2.50X48.   Post intervention, there is a 0% residual stenosis.   Successful IVUS guided angioplasty and drug-eluting stent placement to the left circumflex using 1 long stent.  Significant vessel size mismatch between the distal and the proximal area.  Distal segment was 2.5 mm in diameter in the proximal area was 3.5 mm in diameter.  The stent was postdilated distally with a 2.75 noncompliant balloon and proximally to 3.5 noncompliant balloon to the maximal stent ID which is 3.5 mm. OM2 has borderline disease proximally but is supplied by good flow from the Y graft from OM 3.   Recommendations: Continue dual antiplatelet therapy for at least 6 months. Aggressive treatment of risk factors. Given underlying chronic kidney disease, will hydrate overnight and recheck renal function in the morning.  Intervention   Patient Profile     76 y.o. male  presented in 2018 with unstable angina with LHC showing severe three-vessel CAD.  He subsequently underwent four-vessel CABG in 12/2016 with LIMA to mid LAD, SVG to distal LAD, jump graft with SVG to OM1 and OM 2.    Due to persistent symptoms, he underwent LHC on 1/302/2023, that showed significant underlying 3-vessel CAD with patent LIMA to mid LAD, and occluded SVG to distal LAD and SVG to OM1/OM2. The RCA was not injected and known to be a small nondominant vessel and occluded in the mid segment.  There was normal LVSF and mildly elevated LVEDP. The LAD was significantly diseased after the LIMA anastomosis and likely not suitable for PCI given the extension of disease to the  apical LAD. The LCx could be treated with PCI, but at the bifurcation lesion would require a long segment stenting with recommendation for staged PCI at [facility].   He continued to have progressive exertional dyspnea. He came to the ER at [facility] on 02/10 for increasing sx, K+ 5.2, BUN/Cr 34/2.5 >> 27/1.67 w/ hydration. Seen by Cards and plan for outpt PCI w/ pt to increase fluid intake at home. Lasix held.  Per 02/10 note from Dr End:  I agree that the best  target for PCI would be his LCx/OM territory.  He also has severe disease in the LAD beyond the LIMA anastomosis.  While the disease is fairly diffuse, I suspect that the most critical stenosis just after the bypass anastomosis could be treated percutaneously if Mr. Vandongen has continued symptoms.  I think it would be reasonable to optimize his antianginal therapy and proceed with staged PCI with Dr. Fletcher Anon at [facility] next week.  We will add isosorbide mononitrate 30 mg daily while continuing home doses of carvedilol and amlodipine.  Dual antiplatelet therapy with aspirin and clopidogrel will also be maintained.   Assessment & Plan    Unstable angina - s/p PCI CFX, complicated procedure, results above - Patient tolerated well - Borderline sinus bradycardia at times, but otherwise vital signs are stable - If rehab is seen, okay for DC today  2.  AKI on CKD - at [facility] on 2/09, creatinine was 2.50, well above his baseline - He was felt to be prerenal as it was associated with hyponatremia and he was felt to be hypovolemic  -He was hydrated - In discharge from [facility], creatinine 1.67, BUN 27 -Lasix 20 mg daily was held at discharge -He has no signs or symptoms of volume overload -Discuss with MD making the Lasix as needed only - EF has been normal with grade 1 diastolic dysfunction by echo 09/2020  Plan: DC today  For questions or updates, please contact [facility] Please consult [email or web address] for contact info under         Signed, Rosaria Ferries, PA-C  08/12/2021, 7:54 AM     I have seen and examined the patient along with Rosaria Ferries, PA-C .  I have reviewed the chart, notes and new data.  I agree with PA's note.  Key new complaints: Doing well, no complications at cardiac cath site.  No cardiovascular complaints.  Walked briskly with nurse. Key examination changes: Healthy right radial access site.  Clinically euvolemic. Key new findings / data: Markedly improved renal function.  PLAN: Use NSAIDs (meloxicam), very sparingly due to risk of GI bleeding and volume retention. Does not seem to need furosemide as a daily medication, can use as needed for swelling/weight gain. Appointment with Christell Faith 08/25/2021  Sanda Klein, MD, [place] [phone] 08/12/2021, 10:15 AM

## 2021-08-12 NOTE — Discharge Summary (Signed)
Discharge Summary    Patient ID: Dale Cook. MRN: [id number]; DOB: 06/29/1945  Admit date: 08/11/2021 Discharge date: 08/12/2021  PCP:  Albina Billet, MD   [facility] Providers Cardiologist:  Kathlyn Sacramento, MD        Discharge Diagnoses    Principal Problem:   Effort angina [facility])    Diagnostic Studies/Procedures    PCI: 08/11/2021   LPAV lesion is 100% stenosed.   Prox LAD to Mid LAD lesion is 95% stenosed.   Mid Cx to Dist Cx lesion is 95% stenosed.   2nd Mrg lesion is 60% stenosed.   A drug-eluting stent was successfully placed using a SYNERGY XD 2.50X48.   Post intervention, there is a 0% residual stenosis.   Successful IVUS guided angioplasty and drug-eluting stent placement to the left circumflex using 1 long stent.  Significant vessel size mismatch between the distal and the proximal area.  Distal segment was 2.5 mm in diameter in the proximal area was 3.5 mm in diameter.  The stent was postdilated distally with a 2.75 noncompliant balloon and proximally to 3.5 noncompliant balloon to the maximal stent ID which is 3.5 mm. OM2 has borderline disease proximally but is supplied by good flow from the Y graft from OM 3.   Recommendations: Continue dual antiplatelet therapy for at least 6 months. Aggressive treatment of risk factors. Given underlying chronic kidney disease, will hydrate overnight and recheck renal function in the morning.  Intervention    _____________   History of Present Illness     Dale Cook. is a 76 y.o. male who  presented in 2018 with unstable angina with LHC showing severe three-vessel CAD.  He subsequently underwent four-vessel CABG in 12/2016 with LIMA to mid LAD, SVG to distal LAD, jump graft with SVG to OM1 and OM 2.     Due to persistent symptoms, he underwent LHC on 07/26/2021, that showed significant underlying 3-vessel CAD with patent LIMA to mid LAD, and occluded SVG to distal LAD and SVG to OM1/OM2. The RCA was  not injected and known to be a small nondominant vessel and occluded in the mid segment.  There was normal LVSF and mildly elevated LVEDP. The LAD was significantly diseased after the LIMA anastomosis and likely not suitable for PCI given the extension of disease to the apical LAD. The LCx could be treated with PCI, but at the bifurcation lesion would require a long segment stenting with recommendation for staged PCI at [facility].    He continued to have progressive exertional dyspnea. He came to the ER at [facility] on 02/10 for increasing sx, K+ 5.2, BUN/Cr 34/2.5 >> 27/1.67 w/ hydration. Seen by Cards and plan for outpt PCI w/ pt to increase fluid intake at home. Lasix held.   Per 02/10 note from Dr End:  I agree that the best target for PCI would be his LCx/OM territory.  He also has severe disease in the LAD beyond the LIMA anastomosis.  While the disease is fairly diffuse, I suspect that the most critical stenosis just after the bypass anastomosis could be treated percutaneously if Mr. Ramos has continued symptoms.  I think it would be reasonable to optimize his antianginal therapy and proceed with staged PCI with Dr. Fletcher Anon at [facility] next week.  We will add isosorbide mononitrate 30 mg daily while continuing home doses of carvedilol and amlodipine.  Dual antiplatelet therapy with aspirin and clopidogrel will also be maintained.   Hospital Course  Consultants: None   Unstable angina - s/p PCI CFX, complicated procedure, results above - Patient tolerated well - Borderline sinus bradycardia at times, but otherwise vital signs are stable - Cardiac rehab has seen, okay for DC today - Has follow-up arranged in [place]  2.  AKI on CKD - at [facility] on 2/09, creatinine was 2.50, well above his baseline - He was felt to be prerenal as it was associated with hyponatremia and he was felt to be hypovolemic  -He was hydrated - at discharge from [facility], creatinine 1.67, BUN 27 -Lasix 20 mg daily was  held at discharge, okay to restart as as needed medication -He is still on meloxicam, will stop this, use Tylenol for pain -He has no signs or symptoms of volume overload - EF has been normal with grade 1 diastolic dysfunction by echo 09/2020  Did the patient have an acute coronary syndrome (MI, NSTEMI, STEMI, etc) this admission?:  No                               Did the patient have a percutaneous coronary intervention (stent / angioplasty)?:  Yes.     Cath/PCI Registry Performance & Quality Measures: Aspirin prescribed? - Yes ADP Receptor Inhibitor (Plavix/Clopidogrel, Brilinta/Ticagrelor or Effient/Prasugrel) prescribed (includes medically managed patients)? - Yes High Intensity Statin (Lipitor 40-80mg  or Crestor 20-40mg ) prescribed? - Yes For EF <40%, was ACEI/ARB prescribed? - Not Applicable (EF >/= [id number]) For EF <40%, Aldosterone Antagonist (Spironolactone or Eplerenone) prescribed? - Not Applicable (EF >/= [id number]) Cardiac Rehab Phase II ordered? - Yes     _____________  Discharge Vitals Blood pressure 135/69, pulse (!) 59, temperature 97.9 F (36.6 C), temperature source Oral, resp. rate 16, height 6' (1.829 m), weight 93.4 kg, SpO2 99 %.  Filed Weights   08/11/21 0730  Weight: 93.4 kg    Labs & Radiologic Studies    CBC Recent Labs    08/12/21 0414  WBC 6.9  HGB 11.7*  HCT 36.5*  MCV 81.5  PLT [id number]   Basic Metabolic Panel Recent Labs    08/12/21 0414  NA 134*  K 4.7  CL 104  CO2 22  GLUCOSE 101*  BUN 16  CREATININE 1.23  CALCIUM 8.8*   Liver Function Tests No results for input(s): AST, ALT, ALKPHOS, BILITOT, PROT, ALBUMIN in the last 72 hours. No results for input(s): LIPASE, AMYLASE in the last 72 hours. High Sensitivity Troponin:   Recent Labs  Lab 08/05/21 1831 08/05/21 2031 08/06/21 0614  TROPONINIHS 6 5 6     BNP Invalid input(s): POCBNP D-Dimer No results for input(s): DDIMER in the last 72 hours. Hemoglobin A1C No results for input(s):  HGBA1C in the last 72 hours. Fasting Lipid Panel No results for input(s): CHOL, HDL, LDLCALC, TRIG, CHOLHDL, LDLDIRECT in the last 72 hours. Thyroid Function Tests No results for input(s): TSH, T4TOTAL, T3FREE, THYROIDAB in the last 72 hours.  Invalid input(s): FREET3 _____________  DG Chest 2 View  Result Date: 08/05/2021 CLINICAL DATA:  Shortness of breath EXAM: CHEST - 2 VIEW COMPARISON:  05/02/2021 FINDINGS: Post sternotomy changes. No focal opacity or pleural effusion. Stable cardiomediastinal silhouette. No pneumothorax. IMPRESSION: No active cardiopulmonary disease. Electronically Signed   By: Donavan Foil M.D.   On: 08/05/2021 19:08   CARDIAC CATHETERIZATION  Result Date: 08/11/2021   LPAV lesion is 100% stenosed.   Prox LAD to Mid LAD lesion is 95% stenosed.  Mid Cx to Dist Cx lesion is 95% stenosed.   2nd Mrg lesion is 60% stenosed.   A drug-eluting stent was successfully placed using a SYNERGY XD 2.50X48.   Post intervention, there is a 0% residual stenosis. Successful IVUS guided angioplasty and drug-eluting stent placement to the left circumflex using 1 long stent.  Significant vessel size mismatch between the distal and the proximal area.  Distal segment was 2.5 mm in diameter in the proximal area was 3.5 mm in diameter.  The stent was postdilated distally with a 2.75 noncompliant balloon and proximally to 3.5 noncompliant balloon to the maximal stent ID which is 3.5 mm. OM2 has borderline disease proximally but is supplied by good flow from the Y graft from OM 3. Recommendations: Continue dual antiplatelet therapy for at least 6 months. Aggressive treatment of risk factors. Given underlying chronic kidney disease, will hydrate overnight and recheck renal function in the morning.   CARDIAC CATHETERIZATION  Result Date: 07/26/2021   LPAV lesion is 100% stenosed.   Prox Cx to Mid Cx lesion is 90% stenosed.   Prox LAD to Mid LAD lesion is 95% stenosed.   1st Mrg lesion is 70% stenosed.    Origin to Prox Graft lesion is 100% stenosed.   Prox RCA to Mid RCA lesion is 100% stenosed.   Origin to Prox Graft lesion is 100% stenosed.   Mid LAD to Dist LAD lesion is 80% stenosed.   Dist LAD lesion is 60% stenosed.   SVG graft was visualized by angiography.   LIMA and is normal in caliber.   The graft exhibits no disease.   The left ventricular systolic function is normal.   LV end diastolic pressure is mildly elevated.   The left ventricular ejection fraction is 55-65% by visual estimate. 1.  Significant underlying three-vessel coronary artery disease with patent LIMA to mid LAD and occluded SVG to distal LAD and SVG to OM1/OM 2.  RCA was not injected and known to be small nondominant and occluded in the midsegment. 2.  Normal LV systolic function mildly elevated left ventricular end-diastolic pressure. Recommendations: Unfortunately, the LAD is significantly diseased after the LIMA anastomosis and likely not suitable for PCI given extension of disease to the apical LAD.  Left circumflex can be treated with PCI but at the bifurcation lesion will long segment of disease.  We will place the patient on clopidogrel and planned staged PCI at [facility].   NM Myocar Multi W/Spect W/Wall Motion / EF  Result Date: 07/13/2021   Abnormal, probably low risk pharmacologic myocardial perfusion stress test.   There is a small in size, moderate to severe, partially reversible defect involving the apical inferior and mid inferolateral segments most consistent with scar and peri-infarct ischemia, though an element of artifact cannot be excluded.   Left ventricular function is normal (LVEF 60-65%).   Attenuation correction CT demonstrates post CABG findings, coronary artery calcification, and aortic atherosclerosis.   Compared to the prior study of 12/07/2018, mid/apical inferior/inferolateral defect is new.   Disposition   Pt is being discharged home today in good condition.  Follow-up Plans & Appointments      Discharge Instructions     Amb Referral to Cardiac Rehabilitation   Complete by: As directed    Diagnosis:  Coronary Stents PTCA     After initial evaluation and assessments completed: Virtual Based Care may be provided alone or in conjunction with Phase 2 Cardiac Rehab based on patient barriers.: Yes  Diet - low sodium heart healthy   Complete by: As directed    Increase activity slowly   Complete by: As directed        Discharge Medications   Allergies as of 08/12/2021   No Known Allergies      Medication List     TAKE these medications    acetaminophen 325 MG tablet Commonly known as: TYLENOL Take 2 tablets (650 mg total) by mouth every 4 (four) hours as needed for headache or mild pain.   amLODipine 10 MG tablet Commonly known as: NORVASC TAKE 1 TABLET EVERY DAY   aspirin 81 MG chewable tablet Chew 81 mg by mouth daily.   atorvastatin 40 MG tablet Commonly known as: LIPITOR Take 40 mg by mouth daily.   carvedilol 25 MG tablet Commonly known as: COREG TAKE 1 TABLET TWICE DAILY   clopidogrel 75 MG tablet Commonly known as: Plavix Take 1 tablet (75 mg total) by mouth daily.   guaiFENesin 600 MG 12 hr tablet Commonly known as: MUCINEX Take 600 mg by mouth 2 (two) times daily as needed (congestion).   isosorbide mononitrate 30 MG 24 hr tablet Commonly known as: IMDUR Take 1 tablet (30 mg total) by mouth daily.   loratadine 10 MG tablet Commonly known as: CLARITIN Take 10 mg by mouth daily as needed for allergies.   meloxicam 15 MG tablet Commonly known as: MOBIC Take 15 mg by mouth daily as needed for pain. Notes to patient: Take as little as possible, possibly only 1/2 tablet, to minimize bleeding risk while on aspirin and Plavix.   pantoprazole 40 MG tablet Commonly known as: PROTONIX Take 1 tablet (40 mg total) by mouth daily.   sulfamethoxazole-trimethoprim 800-160 MG tablet Commonly known as: BACTRIM DS Take 1 tablet by mouth 2  (two) times daily.   tamsulosin 0.4 MG Caps capsule Commonly known as: FLOMAX Take 0.4 mg by mouth daily.           Outstanding Labs/Studies   None  Duration of Discharge Encounter   Greater than 30 minutes including physician time.  Signed, Rosaria Ferries, PA-C 08/12/2021, 10:32 AM

## 2021-08-12 NOTE — Progress Notes (Signed)
CARDIAC REHAB PHASE I   PRE:  Rate/Rhythm: 75 SR    BP: sitting 135/69    SaO2:   MODE:  Ambulation: 400 ft   POST:  Rate/Rhythm: 101 ST    BP: sitting 140/76     SaO2:   Pt ambulated at quick pace, no SOB. Feels well. Discussed stent, Plavix, restrictions, diet, exercise, NTG and CRPII. Pt receptive. Will refer to [facility] CRPII.  [id number]   Harriet Masson CES, ACSM 08/12/2021 8:50 AM

## 2021-08-12 NOTE — Discharge Instructions (Signed)
WEIGH DAILY, every am, wearing the same amount of clothing Record weights, and bring it to office appointments Take a furosemide/Lasix tablet for weight gain of 3 lbs in a day or 5 lbs in a week Limit sodium to 500 mg per meal, total 2000 mg per day Limit all liquids to 1.5-2 liters/quarts per day Contact [name], MD if the furosemide/Lasix does not improve your symptoms.  PLEASE REMEMBER TO BRING ALL OF YOUR MEDICATIONS TO EACH OF YOUR FOLLOW-UP OFFICE VISITS.  PLEASE ATTEND ALL SCHEDULED FOLLOW-UP APPOINTMENTS.   Activity: Increase activity slowly as tolerated. You may shower, but no soaking baths (or swimming) for 1 week. No driving for 2 days. No lifting over 5 lbs for 1 week. No sexual activity for 1 week.   You May Return to Work: in 1 week (if applicable)  Wound Care: You may wash cath site gently with soap and water. Keep cath site clean and dry. If you notice pain, swelling, bleeding or pus at your cath site, please call [phone].    Cardiac Cath Site Care Refer to this sheet in the next few weeks. These instructions provide you with information on caring for yourself after your procedure. Your caregiver may also give you more specific instructions. Your treatment has been planned according to current medical practices, but problems sometimes occur. Call your caregiver if you have any problems or questions after your procedure. HOME CARE INSTRUCTIONS You may shower 24 hours after the procedure. Remove the bandage (dressing) and gently wash the site with plain soap and water. Gently pat the site dry.  Do not apply powder or lotion to the site.  Do not sit in a bathtub, swimming pool, or whirlpool for 5 to 7 days.  No bending, squatting, or lifting anything over 10 pounds (4.5 kg) as directed by your caregiver.  Inspect the site at least twice daily.  Do not drive home if you are discharged the same day of the procedure. Have someone else drive you.  You may drive 24  hours after the procedure unless otherwise instructed by your caregiver.  What to expect: Any bruising will usually fade within 1 to 2 weeks.  Blood that collects in the tissue (hematoma) may be painful to the touch. It should usually decrease in size and tenderness within 1 to 2 weeks.  SEEK IMMEDIATE MEDICAL CARE IF: You have unusual pain at the site or down the affected limb.  You have redness, warmth, swelling, or pain at the site.  You have drainage (other than a small amount of blood on the dressing).  You have chills.  You have a fever or persistent symptoms for more than 72 hours.  You have a fever and your symptoms suddenly get worse.  Your leg becomes pale, cool, tingly, or numb.  You have heavy bleeding from the site. Hold pressure on the site.  Document Released: 07/16/2010 Document Revised: 06/02/2011 Document Reviewed: 07/16/2010 [facility] Patient Information 2012 [place], [place].

## 2021-08-24 NOTE — Progress Notes (Signed)
Cardiology Office Note    Date:  08/25/2021   ID:  Dale Nida Demontrey Davydov., DOB 1945-12-28, MRN [id number]  PCP:  Dale Billet, MD  Cardiologist:  Dale Sacramento, MD  Electrophysiologist:  None   Chief Complaint: Follow-up  History of Present Illness:   Dale Mcmanigal. is a 76 y.o. male with history of CAD status post CABG in 12/2016 s/p PCI/DES to the LCx on 08/11/2021, HFpEF, HTN, and HLD who presents for hospital follow up as outlined below.   He presented in 2018 with unstable angina with LHC showing severe three-vessel CAD.  He subsequently underwent four-vessel CABG in 12/2016 with LIMA to mid LAD, SVG to distal LAD, jump graft with SVG to OM1 and OM 2.  Carotid artery ultrasound prior to surgery showed no significant disease.  Echo showed normal LV systolic function with no significant valvular abnormalities.  Lexiscan MPI on 07/13/2021 showed a small in size, moderate in severity, partially reversible defect involving the apical inferior and mid inferolateral segments most consistent with scar and peri-infarct ischemia.  Compared to prior study in 2020, the mid/apical inferior.inferolateral defect was new. LVEF normal. Due to persistent symptoms, he underwent LHC on 1/302/2023, that showed significant underlying 3-vessel CAD with patent LIMA to mid LAD, and occluded SVG to distal LAD and SVG to OM1/OM2. The RCA was not injected and known to be a small nondominant vessel and occluded in the mid segment.  There was normal LVSF and mildly elevated LVEDP. The LAD was significantly diseased after the LIMA anastomosis and likely not suitable for PCI given the extension of disease to the apical LAD. The LCx could be treated with PCI, but at the bifurcation lesion would require a long segment stenting with recommendation for staged PCI at [facility].    He was admitted to [facility] from 2/9 to 2/10 with dyspnea concerning for anginal equivalent along with volume depletion. Imdur was added and  ultimately, the decision was made to move forward with planned LHC with Dr. Fletcher Anon at [facility] the following week.   He underwent planned repeat LHC at [facility] on 08/11/2021 with successful IVUS-guide PCI/DES to the LCx using 1 long stent (2.5x48). There was significant vessel size mismatch between the distal and proximal area with the distal segment 2.5 mm in diameter and the proximal area 3.5 mm in diameter. The stent was post-dilated to the maximal stent ID, 3.5 mm. The OM2 had borderline disease proximally, though was supplied by good flow from the Y graft from OM3.    He comes in doing very well from a cardiac perspective.  Since undergoing PCI to the LCx he has noted a significant improvement in his exertional dyspnea.  No frank angina.  Functional status is improving.  He is largely limited by knee discomfort at this time.  He has not had any falls, hematochezia, or melena.  He has not missed any doses of DAPT.  He has continued to take furosemide on a daily basis following his discharge from [facility].  He is no longer taking meloxicam.  He will be needing to undergo eyelid surgery later this year.  Overall, he is very pleased with his improvement and does not have any issues or concerns at this time.   Labs independently reviewed: 07/2021 - Hgb 11.7, PLT 227, potassium 4.7, BUN 16, serum creatinine 1.23 04/2021 - magnesium 2.5, TC 90, TG 56, HDL 19, LDL 60, A1c 6.6, albumin 3.1, AST/ALT normal 11/2018 - TSH  normal   Past Medical History:  Diagnosis Date   Arthritis    "hands, knees" (01/09/2017)   CAD (coronary artery disease)    a. 12/2016 Cath: LM nl, LAD 95p, 31m, 60/90d, LCX 80p/m, 148m, OM1 68m, OM2 90, RCA 99p, Ef 55-65%; b. 12/2016 CABG x 4 (LIMA->mLAD, VG->dLAD, VG->OM1->OM2); c. 11/2018 MV: EF 66%, no ischemia.   Cardiac murmur    Grade II/VI early peaking systolic in aortic area   CHF (congestive heart failure) (HCC)    Essential hypertension    GERD (gastroesophageal reflux  disease)    High cholesterol    History of echocardiogram    a. 12/2016 Echo: EF 65-70%.   Left carotid bruit    Wears dentures    partial upper    Past Surgical History:  Procedure Laterality Date   CATARACT EXTRACTION W/PHACO Left 06/12/2020   Procedure: CATARACT EXTRACTION PHACO AND INTRAOCULAR LENS PLACEMENT (IOC) LEFT 4.36 00:51.9 8.4%;  Surgeon: Lockie Mola, MD;  Location[facility];  Service: Ophthalmology;  Laterality: Left;   COLONOSCOPY WITH PROPOFOL N/A 11/09/2016   Procedure: COLONOSCOPY WITH PROPOFOL;  Surgeon: Earline Mayotte, MD;  Location: [facility] ENDOSCOPY;  Service: Endoscopy;  Laterality: N/A;   COLONOSCOPY WITH PROPOFOL N/A 11/29/2019   Procedure: COLONOSCOPY WITH PROPOFOL;  Surgeon: Earline Mayotte, MD;  Location: [facility] ENDOSCOPY;  Service: Endoscopy;  Laterality: N/A;   CORONARY ARTERY BYPASS GRAFT N/A 01/12/2017   CABG x 4: LIMA to mLAD, SVG to dLAD, sequential SVG to OM1-OM2, EVH via left thigh and leg;  Surgeon: Purcell Nails, MD;  Location: [facility] OR;  Service: Open Heart Surgery;  Laterality: N/A;   CORONARY STENT INTERVENTION N/A 08/11/2021   Procedure: CORONARY STENT INTERVENTION;  Surgeon: Iran Ouch, MD;  Location: [facility] INVASIVE CV LAB;  Service: Cardiovascular;  Laterality: N/A;   INTRAOPERATIVE TRANSESOPHAGEAL ECHOCARDIOGRAM N/A 01/12/2017   Procedure: INTRAOPERATIVE TRANSESOPHAGEAL ECHOCARDIOGRAM;  Surgeon: Purcell Nails, MD;  Location: [facility] OR;  Service: Open Heart Surgery;  Laterality: N/A;   INTRAVASCULAR ULTRASOUND/IVUS N/A 08/11/2021   Procedure: Intravascular Ultrasound/IVUS;  Surgeon: Iran Ouch, MD;  Location: [facility] INVASIVE CV LAB;  Service: Cardiovascular;  Laterality: N/A;   LEFT HEART CATH AND CORONARY ANGIOGRAPHY Left 01/09/2017   Procedure: Left Heart Cath and Coronary Angiography;  Surgeon: Iran Ouch, MD;  Location: [facility] INVASIVE CV LAB;  Service: Cardiovascular;  Laterality: Left;   LEFT HEART CATH AND  CORS/GRAFTS ANGIOGRAPHY N/A 07/26/2021   Procedure: LEFT HEART CATH AND CORS/GRAFTS ANGIOGRAPHY;  Surgeon: Iran Ouch, MD;  Location: [facility] INVASIVE CV LAB;  Service: Cardiovascular;  Laterality: N/A;   TOTAL KNEE ARTHROPLASTY Right 01/18/2021   Procedure: TOTAL KNEE ARTHROPLASTY;  Surgeon: Lyndle Herrlich, MD;  Location: [facility] ORS;  Service: Orthopedics;  Laterality: Right;    Current Medications: Current Meds  Medication Sig   acetaminophen (TYLENOL) 325 MG tablet Take 2 tablets (650 mg total) by mouth every 4 (four) hours as needed for headache or mild pain.   amLODipine (NORVASC) 10 MG tablet TAKE 1 TABLET EVERY DAY   aspirin 81 MG chewable tablet Chew 81 mg by mouth daily.   atorvastatin (LIPITOR) 40 MG tablet Take 40 mg by mouth daily.   carvedilol (COREG) 25 MG tablet TAKE 1 TABLET TWICE DAILY   clopidogrel (PLAVIX) 75 MG tablet Take 1 tablet (75 mg total) by mouth daily.   furosemide (LASIX) 20 MG tablet Take 1 tablet (20 mg) by mouth EVERY OTHER day  guaiFENesin (MUCINEX) 600 MG 12 hr tablet Take 600 mg by mouth 2 (two) times daily as needed (congestion).   isosorbide mononitrate (IMDUR) 30 MG 24 hr tablet Take 1 tablet (30 mg total) by mouth daily.   loratadine (CLARITIN) 10 MG tablet Take 10 mg by mouth daily as needed for allergies.   pantoprazole (PROTONIX) 40 MG tablet Take 1 tablet (40 mg total) by mouth daily.   tamsulosin (FLOMAX) 0.4 MG CAPS capsule Take 0.4 mg by mouth daily.    Allergies:   Patient has no known allergies.   Social History   Socioeconomic History   Marital status: Divorced    Spouse name: Not on file   Number of children: 3   Years of education: Not on file   Highest education level: Not on file  Occupational History   Not on file  Tobacco Use   Smoking status: Never   Smokeless tobacco: Never  Vaping Use   Vaping Use: Never used  Substance and Sexual Activity   Alcohol use: No   Drug use: No   Sexual activity: Not Currently  Other  Topics Concern   Not on file  Social History Narrative   Not on file   Social Determinants of Health   Financial Resource Strain: Not on file  Food Insecurity: Not on file  Transportation Needs: Not on file  Physical Activity: Sufficiently Active   Days of Exercise per Week: 7 days   Minutes of Exercise per Session: 120 min  Stress: Not on file  Social Connections: Not on file     Family History:  The patient's family history includes Colon cancer in his brother; Heart attack in his mother.  ROS:   Review of Systems  Constitutional:  Negative for chills, diaphoresis, fever, malaise/fatigue and weight loss.  HENT:  Negative for congestion.   Eyes:  Negative for discharge and redness.  Respiratory:  Negative for cough, sputum production, shortness of breath and wheezing.   Cardiovascular:  Negative for chest pain, palpitations, orthopnea, claudication, leg swelling and PND.  Gastrointestinal:  Negative for abdominal pain, blood in stool, heartburn, melena, nausea and vomiting.  Musculoskeletal:  Positive for joint pain. Negative for falls and myalgias.  Skin:  Negative for rash.  Neurological:  Negative for dizziness, tingling, tremors, sensory change, speech change, focal weakness, loss of consciousness and weakness.  Endo/Heme/Allergies:  Does not bruise/bleed easily.  Psychiatric/Behavioral:  Negative for substance abuse. The patient is not nervous/anxious.     EKGs/Labs/Other Studies Reviewed:    Studies reviewed were summarized above. The additional studies were reviewed today:  LHC 08/11/2021:   LPAV lesion is 100% stenosed.   Prox LAD to Mid LAD lesion is 95% stenosed.   Mid Cx to Dist Cx lesion is 95% stenosed.   2nd Mrg lesion is 60% stenosed.   A drug-eluting stent was successfully placed using a SYNERGY XD 2.50X48.   Post intervention, there is a 0% residual stenosis.   Successful IVUS guided angioplasty and drug-eluting stent placement to the left circumflex  using 1 long stent.  Significant vessel size mismatch between the distal and the proximal area.  Distal segment was 2.5 mm in diameter in the proximal area was 3.5 mm in diameter.  The stent was postdilated distally with a 2.75 noncompliant balloon and proximally to 3.5 noncompliant balloon to the maximal stent ID which is 3.5 mm. OM2 has borderline disease proximally but is supplied by good flow from the Y graft from OM 3.  Recommendations: Continue dual antiplatelet therapy for at least 6 months. Aggressive treatment of risk factors. Given underlying chronic kidney disease, will hydrate overnight and recheck renal function in the morning. __________  [facility] 07/26/2021:   LPAV lesion is 100% stenosed.   Prox Cx to Mid Cx lesion is 90% stenosed.   Prox LAD to Mid LAD lesion is 95% stenosed.   1st Mrg lesion is 70% stenosed.   Origin to Prox Graft lesion is 100% stenosed.   Prox RCA to Mid RCA lesion is 100% stenosed.   Origin to Prox Graft lesion is 100% stenosed.   Mid LAD to Dist LAD lesion is 80% stenosed.   Dist LAD lesion is 60% stenosed.   SVG graft was visualized by angiography.   LIMA and is normal in caliber.   The graft exhibits no disease.   The left ventricular systolic function is normal.   LV end diastolic pressure is mildly elevated.   The left ventricular ejection fraction is 55-65% by visual estimate.   1.  Significant underlying three-vessel coronary artery disease with patent LIMA to mid LAD and occluded SVG to distal LAD and SVG to OM1/OM 2.  RCA was not injected and known to be small nondominant and occluded in the midsegment. 2.  Normal LV systolic function mildly elevated left ventricular end-diastolic pressure.   Recommendations: Unfortunately, the LAD is significantly diseased after the LIMA anastomosis and likely not suitable for PCI given extension of disease to the apical LAD.  Left circumflex can be treated with PCI but at the bifurcation lesion will long  segment of disease.  We will place the patient on clopidogrel and planned staged PCI at [facility]. __________  Carlton Adam MPI 06/2021:   Abnormal, probably low risk pharmacologic myocardial perfusion stress test.   There is a small in size, moderate to severe, partially reversible defect involving the apical inferior and mid inferolateral segments most consistent with scar and peri-infarct ischemia, though an element of artifact cannot be excluded.   Left ventricular function is normal (LVEF 60-65%).   Attenuation correction CT demonstrates post CABG findings, coronary artery calcification, and aortic atherosclerosis.   Compared to the prior study of 12/07/2018, mid/apical inferior/inferolateral defect is new. __________  2D echo 09/2020: 1. Left ventricular ejection fraction, by estimation, is 60 to 65%. The  left ventricle has normal function. The left ventricle has no regional  wall motion abnormalities. Left ventricular diastolic parameters are  consistent with Grade I diastolic  dysfunction (impaired relaxation).   2. Right ventricular systolic function is normal. The right ventricular  size is normal. There is normal pulmonary artery systolic pressure. The  estimated right ventricular systolic pressure is 0000000 mmHg.   3. Left atrial size was mildly dilated.   4. The mitral valve is normal in structure. Mild mitral valve  regurgitation.   5. The aortic valve is normal in structure. Aortic valve regurgitation is  mild.  __________   Carlton Adam MPI 11/2018: Pharmacological myocardial perfusion imaging study with no significant  ischemia Normal wall motion, EF estimated at 66% No EKG changes concerning for ischemia at peak stress or in recovery. Nonspecific T wave ABN on resting EKG V3 to V6 Low risk scan   EKG:  EKG is ordered today.  The EKG ordered today demonstrates NSR, 85 bpm, LVH, nonspecific ST-T changes improved from prior tracing  Recent Labs: 05/02/2021: ALT 14; B  Natriuretic Peptide 218.6 05/05/2021: Magnesium 2.5 08/12/2021: BUN 16; Creatinine, Ser 1.23; Hemoglobin 11.7; Platelets 227;  Potassium 4.7; Sodium 134  Recent Lipid Panel    Component Value Date/Time   CHOL 90 05/04/2021 0401   CHOL 111 04/28/2021 0823   TRIG 56 05/04/2021 0401   HDL 19 (L) 05/04/2021 0401   HDL 35 (L) 04/28/2021 0823   CHOLHDL 4.7 05/04/2021 0401   VLDL 11 05/04/2021 0401   LDLCALC 60 05/04/2021 0401   LDLCALC 60 04/28/2021 0823    PHYSICAL EXAM:    VS:  BP 126/68 (BP Location: Left Arm, Patient Position: Sitting, Cuff Size: Normal)    Pulse 85    Ht 6' (1.829 m)    Wt 216 lb 6 oz (98.1 kg)    SpO2 95%    BMI 29.35 kg/m   BMI: Body mass index is 29.35 kg/m.  Physical Exam Constitutional:      Appearance: He is well-developed.  HENT:     Head: Normocephalic and atraumatic.  Eyes:     General:        Right eye: No discharge.        Left eye: No discharge.  Neck:     Vascular: No JVD.  Cardiovascular:     Rate and Rhythm: Normal rate and regular rhythm.     Pulses:          Posterior tibial pulses are 2+ on the right side and 2+ on the left side.     Heart sounds: Normal heart sounds, S1 normal and S2 normal. Heart sounds not distant. No midsystolic click and no opening snap. No murmur heard.   No friction rub.     Comments: Right radial arteriotomy site is well-healed without active bleeding, bruising, swelling, warmth, erythema, or tenderness to palpation. Pulmonary:     Effort: Pulmonary effort is normal. No respiratory distress.     Breath sounds: Normal breath sounds. No decreased breath sounds, wheezing or rales.  Chest:     Chest wall: No tenderness.  Abdominal:     General: There is no distension.     Palpations: Abdomen is soft.     Tenderness: There is no abdominal tenderness.  Musculoskeletal:     Cervical back: Normal range of motion.     Right lower leg: No edema.     Left lower leg: No edema.  Skin:    General: Skin is warm and dry.      Nails: There is no clubbing.  Neurological:     Mental Status: He is alert and oriented to person, place, and time.  Psychiatric:        Speech: Speech normal.        Behavior: Behavior normal.        Thought Content: Thought content normal.        Judgment: Judgment normal.    Wt Readings from Last 3 Encounters:  08/25/21 216 lb 6 oz (98.1 kg)  08/11/21 206 lb (93.4 kg)  08/05/21 206 lb (93.4 kg)     ASSESSMENT & PLAN:   CAD status post CABG status post PCI without angina: He comes in doing very well and is without any symptoms concerning for angina.  Continue DAPT with aspirin and clopidogrel without interruption for at least 6 months dating back to date of PCI (08/11/2021).  Check CBC.  He will otherwise continue aggressive risk factor modification and secondary prevention including amlodipine, atorvastatin, carvedilol, and Imdur.  He will be enrolling in cardiac rehab.  He does note he would like to undergo elective eyelid surgery later this year.  I have advised him we would need to wait at least a minimum of 6 months from date of PCI prior to considering interruption of DAPT.  He understands and is agreeable to this.  Cardiac risk stratification can be undertaken formally at that time.  Currently, no indication for further ischemic testing.  HFpEF: He appears euvolemic and well compensated.  He has continued to take furosemide 20 mg on a daily basis following his discharge from [facility].  I have advised him for now we will decrease the frequency of this to every other day.   HTN: Blood pressure is well controlled in the office today.  Continue current medical therapy including amlodipine, carvedilol, and Imdur.  HLD: LDL 60 in 04/2021.  He remains on atorvastatin.  AKI: Resolved following IV hydration.  Check BMP.   Disposition: F/u with Dr. Fletcher Anon or an APP in 3 months.   Medication Adjustments/Labs and Tests Ordered: Current medicines are reviewed at length with the  patient today.  Concerns regarding medicines are outlined above. Medication changes, Labs and Tests ordered today are summarized above and listed in the Patient Instructions accessible in Encounters.   Signed, Christell Faith, PA-C 08/25/2021 4:01 PM     [facility] [address] [place] [address] [place], [place] [address] [phone]

## 2021-08-25 ENCOUNTER — Ambulatory Visit: Payer: Medicare HMO | Admitting: Physician Assistant

## 2021-08-25 ENCOUNTER — Other Ambulatory Visit: Payer: Self-pay

## 2021-08-25 VITALS — BP 126/68 | HR 85 | Ht 72.0 in | Wt 216.4 lb

## 2021-08-25 DIAGNOSIS — I25810 Atherosclerosis of coronary artery bypass graft(s) without angina pectoris: Secondary | ICD-10-CM

## 2021-08-25 DIAGNOSIS — N179 Acute kidney failure, unspecified: Secondary | ICD-10-CM

## 2021-08-25 DIAGNOSIS — I10 Essential (primary) hypertension: Secondary | ICD-10-CM | POA: Diagnosis not present

## 2021-08-25 DIAGNOSIS — Z79899 Other long term (current) drug therapy: Secondary | ICD-10-CM

## 2021-08-25 DIAGNOSIS — E785 Hyperlipidemia, unspecified: Secondary | ICD-10-CM | POA: Diagnosis not present

## 2021-08-25 DIAGNOSIS — I25708 Atherosclerosis of coronary artery bypass graft(s), unspecified, with other forms of angina pectoris: Secondary | ICD-10-CM | POA: Diagnosis not present

## 2021-08-25 DIAGNOSIS — I5032 Chronic diastolic (congestive) heart failure: Secondary | ICD-10-CM | POA: Diagnosis not present

## 2021-08-25 NOTE — Patient Instructions (Signed)
Medication Instructions:  ?- Your physician has recommended you make the following change in your medication:  ? ?1) DECREASE lasix (furosemide) 20 mg: ?- take 1 tablet by mouth EVERY OTHER day ? ?*If you need a refill on your cardiac medications before your next appointment, please call your pharmacy* ? ? ?Lab Work: ?- Your physician recommends that you have lab work today:  BMP/ CBC ? ?If you have labs (blood work) drawn today and your tests are completely normal, you will receive your results only by: ?MyChart Message (if you have MyChart) OR ?A paper copy in the mail ?If you have any lab test that is abnormal or we need to change your treatment, we will call you to review the results. ? ? ?Testing/Procedures: ?- none ordered ? ? ?Follow-Up: ?At [facility], you and your health needs are our priority.  As part of our continuing mission to provide you with exceptional heart care, we have created designated Provider Care Teams.  These Care Teams include your primary Cardiologist (physician) and Advanced Practice Providers (APPs -  Physician Assistants and Nurse Practitioners) who all work together to provide you with the care you need, when you need it. ? ?We recommend signing up for the patient portal called "MyChart".  Sign up information is provided on this After Visit Summary.  MyChart is used to connect with patients for Virtual Visits (Telemedicine).  Patients are able to view lab/test results, encounter notes, upcoming appointments, etc.  Non-urgent messages can be sent to your provider as well.   ?To learn more about what you can do with MyChart, go to [email or web address].   ? ?Your next appointment:   ?3 month(s) ? ?The format for your next appointment:   ?In Person ? ?Provider:   ?You may see Kathlyn Sacramento, MD or one of the following Advanced Practice Providers on your designated Care Team:   ? ?Christell Faith, PA-C ? ? ? ?Other Instructions ?N/a ? ?

## 2021-08-26 ENCOUNTER — Telehealth: Payer: Self-pay

## 2021-08-26 LAB — CBC
Hematocrit: 36.3 % — ABNORMAL LOW (ref 37.5–51.0)
Hemoglobin: 11.8 g/dL — ABNORMAL LOW (ref 13.0–17.7)
MCH: 25.9 pg — ABNORMAL LOW (ref 26.6–33.0)
MCHC: 32.5 g/dL (ref 31.5–35.7)
MCV: 80 fL (ref 79–97)
Platelets: 258 10*3/uL (ref 150–450)
RBC: 4.55 x10E6/uL (ref 4.14–5.80)
RDW: 15.1 % (ref 11.6–15.4)
WBC: 11.4 10*3/uL — ABNORMAL HIGH (ref 3.4–10.8)

## 2021-08-26 LAB — BASIC METABOLIC PANEL
BUN/Creatinine Ratio: 15 (ref 10–24)
BUN: 19 mg/dL (ref 8–27)
CO2: 19 mmol/L — ABNORMAL LOW (ref 20–29)
Calcium: 8.9 mg/dL (ref 8.6–10.2)
Chloride: 96 mmol/L (ref 96–106)
Creatinine, Ser: 1.24 mg/dL (ref 0.76–1.27)
Glucose: 128 mg/dL — ABNORMAL HIGH (ref 70–99)
Potassium: 4.3 mmol/L (ref 3.5–5.2)
Sodium: 133 mmol/L — ABNORMAL LOW (ref 134–144)
eGFR: 61 mL/min/{1.73_m2} (ref >59)

## 2021-08-26 NOTE — Addendum Note (Signed)
Addended by: Theola Sequin on: 08/26/2021 02:59 PM ? ? Modules accepted: Orders ? ?

## 2021-08-26 NOTE — Telephone Encounter (Signed)
Able to reach pt regarding his recent lab work, Ward Givens, NP had a chance to review their results and advised  ? ?"Kidney function and electrolytes are stable.  Blood counts are stable, though WBC mildly elevated, which in the absence of complaints yesterday, is likely nonspecific, but if he's having symptoms of infection, he may need to be evaluated by his PCP. " ? ?Mr. Dale Cook reports he will call PCP, Dr. Arlana Pouch with concerns for elevation in WBC, otherwise all questions or concerns were address and no additional concerns at this time.  ?

## 2021-09-13 ENCOUNTER — Other Ambulatory Visit: Payer: Self-pay | Admitting: Cardiovascular Disease

## 2021-09-20 ENCOUNTER — Telehealth: Payer: Self-pay | Admitting: Physician Assistant

## 2021-09-20 MED ORDER — ISOSORBIDE MONONITRATE ER 30 MG PO TB24: 30.0000 mg | ORAL_TABLET | Freq: Every day | ORAL | 1 refills | Status: AC

## 2021-09-20 NOTE — Telephone Encounter (Signed)
Patient has a question regarding his Isosobide doseage. He states the pharmacy filled 60 mg. Please call.

## 2021-09-20 NOTE — Telephone Encounter (Signed)
Spoke with the patient. ?Confirmed with the patient that he should be taking Imdur 30 mg daily. ?Patient has been taking Imdur 30 mg daily and it is what is listed on the patients current med list. ?Patient sts that he picked an refill and was given 60 mg tablets. ?Patient rqst refill with an ol Rx number. ?Konrad Saha, PA was listed as the prescriber. ? ?Adv the patient that he can cut the 60 mg tablets in half and take a half of a tablet daily. ?Refill for Imdur 30 mg tablets sent to the patients pharmacy with a msg that the patient will contact the pharmacy when the refill is needed. ? ?

## 2021-09-29 ENCOUNTER — Other Ambulatory Visit: Payer: Self-pay | Admitting: Cardiovascular Disease

## 2021-10-13 ENCOUNTER — Other Ambulatory Visit: Payer: Self-pay | Admitting: Cardiovascular Disease

## 2021-10-20 ENCOUNTER — Other Ambulatory Visit: Payer: Self-pay | Admitting: Cardiovascular Disease

## 2021-11-02 ENCOUNTER — Other Ambulatory Visit: Payer: Self-pay | Admitting: Cardiovascular Disease

## 2021-11-04 ENCOUNTER — Other Ambulatory Visit: Payer: Self-pay

## 2021-11-04 ENCOUNTER — Emergency Department
Admission: EM | Admit: 2021-11-04 | Discharge: 2021-11-04 | Disposition: A | Discharge status: Home / Self Care | Payer: Medicare HMO | Attending: Emergency Medicine | Admitting: Emergency Medicine

## 2021-11-04 ENCOUNTER — Emergency Department: Payer: Medicare HMO

## 2021-11-04 DIAGNOSIS — R600 Localized edema: Secondary | ICD-10-CM | POA: Diagnosis not present

## 2021-11-04 DIAGNOSIS — M7989 Other specified soft tissue disorders: Secondary | ICD-10-CM | POA: Diagnosis not present

## 2021-11-04 DIAGNOSIS — I509 Heart failure, unspecified: Secondary | ICD-10-CM | POA: Diagnosis not present

## 2021-11-04 DIAGNOSIS — N189 Chronic kidney disease, unspecified: Secondary | ICD-10-CM | POA: Insufficient documentation

## 2021-11-04 DIAGNOSIS — R0602 Shortness of breath: Secondary | ICD-10-CM | POA: Diagnosis present

## 2021-11-04 LAB — BASIC METABOLIC PANEL
Anion gap: 7 (ref 5–15)
BUN: 14 mg/dL (ref 8–23)
CO2: 23 mmol/L (ref 22–32)
Calcium: 8.5 mg/dL — ABNORMAL LOW (ref 8.9–10.3)
Chloride: 105 mmol/L (ref 98–111)
Creatinine, Ser: 1.27 mg/dL — ABNORMAL HIGH (ref 0.61–1.24)
GFR, Estimated: 60 mL/min — ABNORMAL LOW (ref >60)
Glucose, Bld: 180 mg/dL — ABNORMAL HIGH (ref 70–99)
Potassium: 3.9 mmol/L (ref 3.5–5.1)
Sodium: 135 mmol/L (ref 135–145)

## 2021-11-04 LAB — CBC
HCT: 40.5 % (ref 39.0–52.0)
Hemoglobin: 12.6 g/dL — ABNORMAL LOW (ref 13.0–17.0)
MCH: 25.3 pg — ABNORMAL LOW (ref 26.0–34.0)
MCHC: 31.1 g/dL (ref 30.0–36.0)
MCV: 81.3 fL (ref 80.0–100.0)
Platelets: 237 10*3/uL (ref 150–400)
RBC: 4.98 MIL/uL (ref 4.22–5.81)
RDW: 16.4 % — ABNORMAL HIGH (ref 11.5–15.5)
WBC: 10 10*3/uL (ref 4.0–10.5)
nRBC: 0 % (ref 0.0–0.2)

## 2021-11-04 LAB — TROPONIN I (HIGH SENSITIVITY): Troponin I (High Sensitivity): 7 ng/L (ref <18)

## 2021-11-04 LAB — BRAIN NATRIURETIC PEPTIDE: B Natriuretic Peptide: 180 pg/mL — ABNORMAL HIGH (ref 0.0–100.0)

## 2021-11-04 MED ORDER — POTASSIUM CHLORIDE 20 MEQ PO PACK
40.0000 meq | PACK | ORAL | Status: AC
  Administered 2021-11-04: 40 meq via ORAL
  Filled 2021-11-04: qty 2

## 2021-11-04 MED ORDER — FUROSEMIDE 10 MG/ML IJ SOLN
40.0000 mg | Freq: Once | INTRAMUSCULAR | Status: AC
  Administered 2021-11-04: 40 mg via INTRAVENOUS
  Filled 2021-11-04: qty 4

## 2021-11-04 NOTE — ED Notes (Signed)
Dr. Quale at bedside.  

## 2021-11-04 NOTE — ED Notes (Signed)
Pt taken for xray

## 2021-11-04 NOTE — Discharge Instructions (Signed)
Follow-up tomorrow at 8:30 AM with Clarisa Kindred at the [facility] regional heart failure clinic. ? ? ?

## 2021-11-04 NOTE — Progress Notes (Signed)
? Patient ID: Zisha Montjoy., male    DOB: 01-Jun-1946, 76 y.o.   MRN: [id number] ? ?HPI ? ?Mr Broberg is a 76 y/o male with a history of CAD, hyperlipidemia, HTN, arthritis and chronic heart failure.  ? ?Echo report from 10/15/20 reviewed and showed an EF of 60-65% along with normal PA Pressure, mild LAE and mild MR.  ? ?LHC done 01/09/17 and showed: ?Prox Cx to Mid Cx lesion, 80 %stenosed. ?Ost 1st Mrg to 1st Mrg lesion, 60 %stenosed. ?Mid Cx lesion, 100 %stenosed. ?Ost LAD to Prox LAD lesion, 95 %stenosed. ?Mid LAD lesion, 60 %stenosed. ?Dist LAD-2 lesion, 60 %stenosed. ?Dist LAD-1 lesion, 90 %stenosed. ?Ost 2nd Mrg to 2nd Mrg lesion, 90 %stenosed. ?Prox RCA to Mid RCA lesion, 99 %stenosed. ?The left ventricular systolic function is normal. ?LV end diastolic pressure is normal. ?The left ventricular ejection fraction is 55-65% by visual estimate. ?  ?1. 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 ?2. Normal LV systolic function and high normal left ventricular end-diastolic pressure. ? ?Was in the ED 11/03/21 due to acute on chronic heart failure. Gave a dose of IV lasix and he was released. Was in the ED 09/22/21 due to acute bilateral foot pain. Xray of bilateral ankle reveals subacute to chronic nondisplaced fracture of the distal tip of both medial malleoli. 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 Released with orthopaedic f/u.  ? ?He presents today for a follow-up visit with a chief complaint of minimal shortness of breath with moderate exertion. Describes this as chronic in nature. He has associated pedal edema (left lower leg), fingertip numbness (chronic) and left foot pain along with this. He denies any difficulty sleeping, dizziness, abdominal distention, palpitations, chest pain, cough, fatigue or weight gain.  ? ?Has had intermittent edema in left lower leg for the last couple of months. Is drinking ~ 80-100 ounces  of fluid daily. Takes his diuretic QOD due to some renal issues a few months ago.  ? ?Past Medical History:  ?Diagnosis Date  ? Arthritis   ? "hands, knees" (01/09/2017)  ? CAD (coronary artery disease)   ? a. 12/2016 Cath: LM nl, LAD 95p, 64m, 60/90d, LCX 80p/m, 179m, OM1 81m, OM2 90, RCA 99p, Ef 55-65%; b. 12/2016 CABG x 4 (LIMA->mLAD, VG->dLAD, VG->OM1->OM2); c. 11/2018 MV: EF 66%, no ischemia.  ? Cardiac murmur   ? Grade II/VI early peaking systolic in aortic area  ? CHF (congestive heart failure) ([place])   ? Essential hypertension   ? GERD (gastroesophageal reflux disease)   ? High cholesterol   ? History of echocardiogram   ? a. 12/2016 Echo: EF 65-70%.  ? Left carotid bruit   ? Wears dentures   ? partial upper  ? ?Past Surgical History:  ?Procedure Laterality Date  ? CATARACT EXTRACTION W/PHACO Left 06/12/2020  ? Procedure: CATARACT EXTRACTION PHACO AND INTRAOCULAR LENS PLACEMENT (IOC) LEFT 4.36 00:51.9 8.4%;  Surgeon: Leandrew Koyanagi, MD;  Location: [facility];  Service: Ophthalmology;  Laterality: Left;  ? COLONOSCOPY WITH PROPOFOL N/A 11/09/2016  ? Procedure: COLONOSCOPY WITH PROPOFOL;  Surgeon: Robert Bellow, MD;  Location: [facility] ENDOSCOPY;  Service: Endoscopy;  Laterality: N/A;  ? COLONOSCOPY WITH PROPOFOL N/A 11/29/2019  ? Procedure: COLONOSCOPY WITH PROPOFOL;  Surgeon: Robert Bellow, MD;  Location: [facility] ENDOSCOPY;  Service: Endoscopy;  Laterality: N/A;  ? CORONARY ARTERY BYPASS GRAFT N/A 01/12/2017  ? CABG x 4: LIMA to mLAD, SVG  to dLAD, sequential SVG to OM1-OM2, EVH via left thigh and leg;  Surgeon: Rexene Alberts, MD;  Location: [place];  Service: Open Heart Surgery;  Laterality: N/A;  ? CORONARY STENT INTERVENTION N/A 08/11/2021  ? Procedure: CORONARY STENT INTERVENTION;  Surgeon: Wellington Hampshire, MD;  Location: [place] [facility];  Service: Cardiovascular;  Laterality: N/A;  ? INTRAOPERATIVE TRANSESOPHAGEAL ECHOCARDIOGRAM N/A 01/12/2017  ? Procedure: INTRAOPERATIVE TRANSESOPHAGEAL  ECHOCARDIOGRAM;  Surgeon: Rexene Alberts, MD;  Location: [place];  Service: Open Heart Surgery;  Laterality: N/A;  ? INTRAVASCULAR ULTRASOUND/IVUS N/A 08/11/2021  ? Procedure: Intravascular Ultrasound/IVUS;  Surgeon: Wellington Hampshire, MD;  Location: [place] [facility];  Service: Cardiovascular;  Laterality: N/A;  ? LEFT HEART CATH AND CORONARY ANGIOGRAPHY Left 01/09/2017  ? Procedure: Left Heart Cath and Coronary Angiography;  Surgeon: Wellington Hampshire, MD;  Location: [place] [facility];  Service: Cardiovascular;  Laterality: Left;  ? LEFT HEART CATH AND CORS/GRAFTS ANGIOGRAPHY N/A 07/26/2021  ? Procedure: LEFT HEART CATH AND CORS/GRAFTS ANGIOGRAPHY;  Surgeon: Wellington Hampshire, MD;  Location: [place] [facility];  Service: Cardiovascular;  Laterality: N/A;  ? TOTAL KNEE ARTHROPLASTY Right 01/18/2021  ? Procedure: TOTAL KNEE ARTHROPLASTY;  Surgeon: Lovell Sheehan, MD;  Location: [facility];  Service: Orthopedics;  Laterality: Right;  ? ?Family History  ?Problem Relation Age of Onset  ? Heart attack Mother   ? Colon cancer Brother   ? ?Social History  ? ?Tobacco Use  ? Smoking status: Never  ? Smokeless tobacco: Never  ?Substance Use Topics  ? Alcohol use: No  ? ?No Known Allergies ? ?Prior to Admission medications   ?Medication Sig Start Date End Date Taking? Authorizing Provider  ?acetaminophen (TYLENOL) 325 MG tablet Take 2 tablets (650 mg total) by mouth every 4 (four) hours as needed for headache or mild pain. 08/12/21  Yes Barrett, Evelene Croon, PA-C  ?amLODipine (NORVASC) 10 MG tablet TAKE 1 TABLET EVERY DAY 10/21/21  Yes Wellington Hampshire, MD  ?aspirin 81 MG chewable tablet Chew 81 mg by mouth daily.   Yes [provider]  ?atorvastatin (LIPITOR) 40 MG tablet Take 40 mg by mouth daily.   Yes [provider]  ?carvedilol (COREG) 25 MG tablet TAKE 1 TABLET TWICE DAILY 10/13/21  Yes Wellington Hampshire, MD  ?clopidogrel (PLAVIX) 75 MG tablet Take 1 tablet (75 mg total) by mouth daily. 07/26/21 07/26/22  Yes Wellington Hampshire, MD  ?furosemide (LASIX) 20 MG tablet Take 1 tablet (20 mg) by mouth EVERY OTHER day   Yes [provider]  ?guaiFENesin (MUCINEX) 600 MG 12 hr tablet Take 600 mg by mouth 2 (two) times daily as needed (congestion).   Yes [provider]  ?loratadine (CLARITIN) 10 MG tablet Take 10 mg by mouth daily as needed for allergies.   Yes [provider]  ?pantoprazole (PROTONIX) 40 MG tablet Take 1 tablet (40 mg total) by mouth daily. 06/04/21  Yes Dunn, Areta Haber, PA-C  ?isosorbide mononitrate (IMDUR) 30 MG 24 hr tablet Take 1 tablet (30 mg total) by mouth daily. 09/20/21 10/20/21  Rise Mu, PA-C  ?tamsulosin (FLOMAX) 0.4 MG CAPS capsule Take 0.4 mg by mouth daily. ?Patient not taking: Reported on 11/05/2021 07/28/21   [provider]  ? ?Review of Systems  ?Constitutional:  Negative for appetite change and fatigue.  ?HENT:  Negative for congestion, postnasal drip and sore throat.   ?Eyes: Negative.   ?Respiratory:  Positive for shortness  of breath (with moderate exertion). Negative for cough.   ?Cardiovascular:  Positive for leg swelling (left knee to foot at times). Negative for chest pain and palpitations.  ?Gastrointestinal:  Negative for abdominal distention and abdominal pain.  ?Endocrine: Negative.   ?Genitourinary: Negative.   ?Musculoskeletal:  Positive for arthralgias (left foot). Negative for back pain.  ?Skin: Negative.   ?Allergic/Immunologic: Negative.   ?Neurological:  Positive for numbness (in fingertips). Negative for dizziness and light-headedness.  ?Hematological:  Negative for adenopathy. Does not bruise/bleed easily.  ?Psychiatric/Behavioral:  Negative for dysphoric mood and sleep disturbance (sleeping on 1 pillow). The patient is not nervous/anxious.   ? ?Vitals:  ? 11/05/21 0820  ?BP: 119/73  ?Pulse: 77  ?Resp: 20  ?SpO2: 100%  ?Weight: 217 lb (98.4 kg)  ?Height: 6' (1.829 m)  ? ?Wt Readings from Last 3 Encounters:  ?11/05/21 217 lb (98.4 kg)   ?11/04/21 212 lb (96.2 kg)  ?08/25/21 216 lb 6 oz (98.1 kg)  ? ?Lab Results  ?Component Value Date  ? CREATININE 1.27 (H) 11/04/2021  ? CREATININE 1.24 08/25/2021  ? CREATININE 1.23 08/12/2021  ? ? ?Physi

## 2021-11-04 NOTE — ED Triage Notes (Signed)
Pt c/o BL LE swelling with SOB for the past week, states his fluid pill helped some but for the past week is worsened, denies chest pain.Marland Kitchen  ?

## 2021-11-04 NOTE — ED Provider Notes (Signed)
? ?[facility] ?Provider Note ? ? ? Event Date/Time  ? First MD Initiated Contact with Patient 11/04/21 [phone]   ?  (approximate) ? ? ?History  ? ?Shortness of Breath and Leg Swelling ? ? ?HPI ?Dale DollarOtis Wilbert Alton Jr. is a 76 y.o. male who on review of discharge summary from February 15 of this year has a history of unstable angina, previous PCI, acute kidney injury on CKD and congestive heart failure ? ?Patient reports in the past he has had swelling in both of his legs, usually more severe, and it gets better with treatment with Lasix.  In the past though he had an episode where he had a problem with his kidneys because he had taken too much Lasix.  Now he takes 20 mg of Lasix daily every other day instead of daily ? ?He is not any chest pain.  Reports he does not even feel short of breath just perhaps a little bit winded with exertion.  He has noticed that he started having swelling in both of his ankles and some of both of his legs below his knees for about the last 3 days.  He took a dose of Lasix yesterday and reports actually looks a little bit better today.  He follows with Dr. Kary KosArita as well as has seen Inetta Fermoina at the heart failure clinic in the past ? ?No cough.  No fever.  No infection that he is aware of.  No abdominal pain no chest pain.  Feels like he may have a little too much fluid ? ?  ? ? ?Physical Exam  ? ?Triage Vital Signs: ?ED Triage Vitals  ?Enc Vitals Group  ?   BP 11/04/21 0853 130/64  ?   Pulse Rate 11/04/21 0853 67  ?   Resp 11/04/21 0853 18  ?   Temp 11/04/21 0854 98.1 ?F (36.7 ?C)  ?   Temp Source 11/04/21 0853 Oral  ?   SpO2 11/04/21 0853 95 %  ?   Weight 11/04/21 0847 212 lb (96.2 kg)  ?   Height 11/04/21 0847 6' (1.829 m)  ?   Head Circumference --   ?   Peak Flow --   ?   Pain Score --   ?   Pain Loc --   ?   Pain Edu? --   ?   Excl. in GC? --   ? ? ?Most recent vital signs: ?Vitals:  ? 11/04/21 1100 11/04/21 1130  ?BP: 132/68 (!) 118/58  ?Pulse: 66 67  ?Resp: (!) 21  (!) 23  ?Temp:    ?SpO2: 99% 95%  ? ? ? ?General: Awake, no distress.  Sitting up pleasantly.  On room air saturation 95%.  No respiratory distress evident ?CV:  Good peripheral perfusion.  Normal heart tones. ?Resp:  Normal effort.  Clear lungs bilaterally.  No rales or crackles.  Speaks in full clear sentences. ?Abd:  No distention.  Soft nontender nondistended ?Other:  Mild to trace 1+ peripheral edema from about the level of the lower tibia down across the top of the foot.  Bilateral.  No calf tenderness no unilateral swelling ? ? ?ED Results / Procedures / Treatments  ? ?Labs ?(all labs ordered are listed, but only abnormal results are displayed) ?Labs Reviewed  ?BASIC METABOLIC PANEL - Abnormal; Notable for the following components:  ?    Result Value  ? Glucose, Bld 180 (*)   ? Creatinine, Ser 1.27 (*)   ? Calcium 8.5 (*)   ?  GFR, Estimated 60 (*)   ? All other components within normal limits  ?CBC - Abnormal; Notable for the following components:  ? Hemoglobin 12.6 (*)   ? MCH 25.3 (*)   ? RDW 16.4 (*)   ? All other components within normal limits  ?BRAIN NATRIURETIC PEPTIDE - Abnormal; Notable for the following components:  ? B Natriuretic Peptide 180.0 (*)   ? All other components within normal limits  ?TROPONIN I (HIGH SENSITIVITY)  ?TROPONIN I (HIGH SENSITIVITY)  ? ? ? ?EKG ? ?Reviewed at 9 AM ?Heart rate 79 QRS 100 ?QTc 420 ?Normal sinus rhythm no evidence of acute ischemia.  Possible old inferior infarct ? ? ?RADIOLOGY ? ?Chest x-ray personally interpreted by me negative for acute infiltrate or abnormality ? ? ?PROCEDURES: ? ?Critical Care performed: No ? ?Procedures ? ? ?MEDICATIONS ORDERED IN ED: ?Medications  ?furosemide (LASIX) injection 40 mg (40 mg Intravenous Given 11/04/21 1033)  ?potassium chloride (KLOR-CON) packet 40 mEq (40 mEq Oral Given 11/04/21 1035)  ? ? ? ?IMPRESSION / MDM / ASSESSMENT AND PLAN / ED COURSE  ?I reviewed the triage vital signs and the nursing notes. ?             ?                ? ?Differential diagnosis includes, but is not limited to, volume overload mild CHF exacerbation, peripheral edema secondary to other cause such as low albumin, heat exposure etc.  No clinical signs or symptoms suggest DVT or PE.  No associated chest pain or evidence of ACS.  Labs reviewed notable for normal troponin slightly elevated BNP which is in keeping with his close baseline, and I do not think indicates a clear severe exacerbation.  Creatinine mildly elevated but again in keeping with his baseline without significant increase or AKI.  Chest x-ray clear.  EKG without ischemia ? ?He has no infectious symptoms.  No evidence of pneumonia COPD or reactive airway disease.  Clinical symptoms seem most suggestive of mild CHF exacerbation with peripheral edema and no pulmonary edema at this time ? ?The patient is on the cardiac monitor to evaluate for evidence of arrhythmia and/or significant heart rate changes. ? ?Discussed with patient.  Will give dose of IV Lasix and reassess.  Discussed with the patient he would be agreeable to follow-up with cardiology or CHF clinic.  Patient will continue on his home Lasix regimen from here forward.  Plan to follow-up with CHF clinic ? ?Return precautions and treatment recommendations and follow-up discussed with the patient who is agreeable with the plan. ? ? ?  ?----------------------------------------- ?11:39 AM on 11/04/2021 ?----------------------------------------- ?Communicated with Clarisa Kindred via texting system and [facility].  She advised she has appointment available tomorrow at 8:30 AM, discussed this with the patient and he is agreeable to follow-up tomorrow at 8:30 AM with Clarisa Kindred at the CHF clinic.  Reports he feels well at this time no shortness of breath he is urinated a couple of times and feels well and ready to go home. ? ?Vitals:  ? 11/04/21 1100 11/04/21 1130  ?BP: 132/68 (!) 118/58  ?Pulse: 66 67  ?Resp: (!) 21 (!) 23  ?Temp:    ?SpO2: 99% 95%   ? ? ?Return precautions and treatment recommendations and follow-up discussed with the patient who is agreeable with the plan. ? ? ?FINAL CLINICAL IMPRESSION(S) / ED DIAGNOSES  ? ?Final diagnoses:  ?Acute on chronic congestive heart failure, unspecified heart failure type (  HCC)  ? ? ? ?Rx / DC Orders  ? ?ED Discharge Orders   ? ?      Ordered  ?  AMB referral to CHF clinic       ?Comments: ED follow-up, chf  ? 11/04/21 1113  ? ?  ?  ? ?  ? ? ? ?Note:  This document was prepared using Dragon voice recognition software and may include unintentional dictation errors. ?  Sharyn Creamer, MD ?11/04/21 1139 ? ?

## 2021-11-05 ENCOUNTER — Ambulatory Visit: Payer: Medicare HMO | Attending: Family | Admitting: Family

## 2021-11-05 ENCOUNTER — Encounter: Payer: Self-pay | Admitting: Family

## 2021-11-05 VITALS — BP 119/73 | HR 77 | Resp 20 | Ht 72.0 in | Wt 217.0 lb

## 2021-11-05 DIAGNOSIS — I5032 Chronic diastolic (congestive) heart failure: Secondary | ICD-10-CM | POA: Insufficient documentation

## 2021-11-05 DIAGNOSIS — I25810 Atherosclerosis of coronary artery bypass graft(s) without angina pectoris: Secondary | ICD-10-CM | POA: Diagnosis not present

## 2021-11-05 DIAGNOSIS — Z951 Presence of aortocoronary bypass graft: Secondary | ICD-10-CM | POA: Diagnosis not present

## 2021-11-05 DIAGNOSIS — I2510 Atherosclerotic heart disease of native coronary artery without angina pectoris: Secondary | ICD-10-CM | POA: Insufficient documentation

## 2021-11-05 DIAGNOSIS — I10 Essential (primary) hypertension: Secondary | ICD-10-CM

## 2021-11-05 DIAGNOSIS — E785 Hyperlipidemia, unspecified: Secondary | ICD-10-CM | POA: Insufficient documentation

## 2021-11-05 DIAGNOSIS — I110 Hypertensive heart disease with heart failure: Secondary | ICD-10-CM | POA: Diagnosis not present

## 2021-11-05 DIAGNOSIS — M1990 Unspecified osteoarthritis, unspecified site: Secondary | ICD-10-CM | POA: Diagnosis not present

## 2021-11-05 NOTE — Patient Instructions (Addendum)
Continue weighing daily and call for an overnight weight gain of 3 pounds or more or a weekly weight gain of more than 5 pounds. ? ? ?If you have voicemail, please make sure your mailbox is cleaned out so that we may leave a message and please make sure to listen to any voicemails.  ? ? ?Drink 64 ounces of fluid daily ?

## 2021-11-24 NOTE — Progress Notes (Unsigned)
Cardiology Office Note    Date:  11/25/2021   ID:  Dale Cook., DOB 1946/06/24, MRN [id number]  PCP:  Albina Billet, MD  Cardiologist:  Kathlyn Sacramento, MD  Electrophysiologist:  None   Chief Complaint: Follow-up  History of Present Illness:   Dale Cook. is a 76 y.o. male with history of CAD status post CABG in 12/2016 s/p PCI/DES to the LCx on 08/11/2021, HFpEF, HTN, and HLD who presents for follow up of CAD and HFpEF.    He presented in 2018 with unstable angina with LHC showing severe three-vessel CAD.  He subsequently underwent four-vessel CABG in 12/2016 with LIMA to mid LAD, SVG to distal LAD, jump graft with SVG to OM1 and OM 2.  Carotid artery ultrasound prior to surgery showed no significant disease.  Echo showed normal LV systolic function with no significant valvular abnormalities.  Lexiscan MPI on 07/13/2021 showed a small in size, moderate in severity, partially reversible defect involving the apical inferior and mid inferolateral segments most consistent with scar and peri-infarct ischemia.  Compared to prior study in 2020, the mid/apical inferior, inferolateral defect was new. LVEF normal. Due to persistent symptoms, he underwent LHC on 07/26/2021, that showed significant underlying 3-vessel CAD with patent LIMA to mid LAD, and occluded SVG to distal LAD and SVG to OM1/OM2. The RCA was not injected and known to be a small nondominant vessel and occluded in the mid segment.  There was normal LVSF and mildly elevated LVEDP. The LAD was significantly diseased after the LIMA anastomosis and likely not suitable for PCI given the extension of disease to the apical LAD. The LCx could be treated with PCI, but at the bifurcation lesion would require a long segment stenting with recommendation for staged PCI at [facility]     He was admitted to [facility] from 2/9 to 08/06/2021 with dyspnea concerning for anginal equivalent along with volume depletion. Imdur was added and  ultimately, the decision was made to move forward with planned LHC with Dr. Fletcher Anon at [facility] the following week.    He underwent planned repeat LHC at [facility] on 08/11/2021 with successful IVUS-guide PCI/DES to the LCx using 1 long stent ([id number]). There was significant vessel size mismatch between the distal and proximal area with the distal segment 2.5 mm in diameter and the proximal area 3.5 mm in diameter. The stent was post-dilated to the maximal stent ID, 3.5 mm. The OM2 had borderline disease proximally, though was supplied by good flow from the Y graft from OM3.    He was last seen in the office on 08/25/2021 and was without symptoms of angina or decompensation.  Since undergoing PCI to the LCx, he noted a significant improvement in his exertional dyspnea.  His functional status was improving.   He was seen in the ED on 09/22/2021 with intermittent ankle and feet swelling.  BNP 131.  CRP and sed rate elevated.  X-ray revealed subacute to chronic nondisplaced fracture of the distal tip of both medial malleoli along with osteoarthritis noted bilaterally.  Symptoms were felt to be orthopedic in etiology.  He was seen in the ED on 11/04/2021 with shortness of breath and intermittent lower extremity swelling.  High-sensitivity troponin negative.  BNP 180.  He was given IV Lasix in the ED with recommendation for outpatient follow-up.  He comes in doing well from a cardiac perspective.  He has been without symptoms of angina or decompensation.  He does continue  to note intermittent bilateral ankle/pedal edema.  He notes significant bilateral knee and foot pain which have been attributed to osteoarthritis.  He does wear compression stockings and try to elevate his legs when he can.  No significant dyspnea, palpitations, dizziness, presyncope, or syncope.  No falls, hematochezia, or melena.  He has not missed any doses of DAPT.  His weight is down 4 pounds today when compared to his last clinic visit.  He  does watch his salt intake though is drinking greater than 2 L of liquid per day.   Labs independently reviewed: 10/2021 - Hgb 12.6, PLT 237, potassium 3.9, BUN 14, serum creatinine 1.27 04/2021 - magnesium 2.5, TC 90, TG 56, HDL 19, LDL 60, A1c 6.6, albumin 3.1, AST/ALT normal 11/2018 - TSH normal   Past Medical History:  Diagnosis Date   Arthritis    "hands, knees" (01/09/2017)   CAD (coronary artery disease)    a. 12/2016 Cath: LM nl, LAD 95p, 49m, 60/90d, LCX 80p/m, 188m, OM1 52m, OM2 90, RCA 99p, Ef 55-65%; b. 12/2016 CABG x 4 (LIMA->mLAD, VG->dLAD, VG->OM1->OM2); c. 11/2018 MV: EF 66%, no ischemia.   Cardiac murmur    Grade II/VI early peaking systolic in aortic area   CHF (congestive heart failure) (HCC)    Essential hypertension    GERD (gastroesophageal reflux disease)    High cholesterol    History of echocardiogram    a. 12/2016 Echo: EF 65-70%.   Left carotid bruit    Wears dentures    partial upper    Past Surgical History:  Procedure Laterality Date   CATARACT EXTRACTION W/PHACO Left 06/12/2020   Procedure: CATARACT EXTRACTION PHACO AND INTRAOCULAR LENS PLACEMENT (IOC) LEFT 4.36 00:51.9 8.4%;  Surgeon: Leandrew Koyanagi, MD;  Location: [place];  Service: Ophthalmology;  Laterality: Left;   COLONOSCOPY WITH PROPOFOL N/A 11/09/2016   Procedure: COLONOSCOPY WITH PROPOFOL;  Surgeon: Robert Bellow, MD;  Location: [facility];  Service: Endoscopy;  Laterality: N/A;   COLONOSCOPY WITH PROPOFOL N/A 11/29/2019   Procedure: COLONOSCOPY WITH PROPOFOL;  Surgeon: Robert Bellow, MD;  Location: [facility];  Service: Endoscopy;  Laterality: N/A;   CORONARY ARTERY BYPASS GRAFT N/A 01/12/2017   CABG x 4: LIMA to mLAD, SVG to dLAD, sequential SVG to OM1-OM2, EVH via left thigh and leg;  Surgeon: Rexene Alberts, MD;  Location: [place];  Service: Open Heart Surgery;  Laterality: N/A;   CORONARY STENT INTERVENTION N/A 08/11/2021   Procedure: CORONARY STENT  INTERVENTION;  Surgeon: Wellington Hampshire, MD;  Location: [place] CV [facility];  Service: Cardiovascular;  Laterality: N/A;   INTRAOPERATIVE TRANSESOPHAGEAL ECHOCARDIOGRAM N/A 01/12/2017   Procedure: INTRAOPERATIVE TRANSESOPHAGEAL ECHOCARDIOGRAM;  Surgeon: Rexene Alberts, MD;  Location: [place];  Service: Open Heart Surgery;  Laterality: N/A;   INTRAVASCULAR ULTRASOUND/IVUS N/A 08/11/2021   Procedure: Intravascular Ultrasound/IVUS;  Surgeon: Wellington Hampshire, MD;  Location: [place] [facility];  Service: Cardiovascular;  Laterality: N/A;   LEFT HEART CATH AND CORONARY ANGIOGRAPHY Left 01/09/2017   Procedure: Left Heart Cath and Coronary Angiography;  Surgeon: Wellington Hampshire, MD;  Location: [place] [facility];  Service: Cardiovascular;  Laterality: Left;   LEFT HEART CATH AND CORS/GRAFTS ANGIOGRAPHY N/A 07/26/2021   Procedure: LEFT HEART CATH AND CORS/GRAFTS ANGIOGRAPHY;  Surgeon: Wellington Hampshire, MD;  Location: [place] [facility];  Service: Cardiovascular;  Laterality: N/A;   TOTAL KNEE ARTHROPLASTY Right 01/18/2021   Procedure: TOTAL KNEE ARTHROPLASTY;  Surgeon: Lovell Sheehan,  MD;  Location: [facility];  Service: Orthopedics;  Laterality: Right;    Current Medications: Current Meds  Medication Sig   acetaminophen (TYLENOL) 325 MG tablet Take 2 tablets (650 mg total) by mouth every 4 (four) hours as needed for headache or mild pain.   aspirin 81 MG chewable tablet Chew 81 mg by mouth daily.   atorvastatin (LIPITOR) 40 MG tablet Take 40 mg by mouth daily.   carvedilol (COREG) 25 MG tablet TAKE 1 TABLET TWICE DAILY   clopidogrel (PLAVIX) 75 MG tablet Take 1 tablet (75 mg total) by mouth daily.   furosemide (LASIX) 20 MG tablet Take 1 tablet (20 mg) by mouth EVERY OTHER day   guaiFENesin (MUCINEX) 600 MG 12 hr tablet Take 600 mg by mouth 2 (two) times daily as needed (congestion).   isosorbide mononitrate (IMDUR) 30 MG 24 hr tablet Take 1 tablet (30 mg total) by mouth daily.   loratadine  (CLARITIN) 10 MG tablet Take 10 mg by mouth daily as needed for allergies.   pantoprazole (PROTONIX) 40 MG tablet Take 1 tablet (40 mg total) by mouth daily.   sacubitril-valsartan (ENTRESTO) 24-26 MG Take 1 tablet by mouth 2 (two) times daily.   tamsulosin (FLOMAX) 0.4 MG CAPS capsule Take 0.4 mg by mouth daily.   [DISCONTINUED] amLODipine (NORVASC) 10 MG tablet TAKE 1 TABLET EVERY DAY    Allergies:   Patient has no known allergies.   Social History   Socioeconomic History   Marital status: Divorced    Spouse name: Not on file   Number of children: 3   Years of education: Not on file   Highest education level: Not on file  Occupational History   Not on file  Tobacco Use   Smoking status: Never   Smokeless tobacco: Never  Vaping Use   Vaping Use: Never used  Substance and Sexual Activity   Alcohol use: No   Drug use: No   Sexual activity: Not Currently  Other Topics Concern   Not on file  Social History Narrative   Not on file   Social Determinants of Health   Financial Resource Strain: Not on file  Food Insecurity: Not on file  Transportation Needs: Not on file  Physical Activity: Sufficiently Active   Days of Exercise per Week: 7 days   Minutes of Exercise per Session: 120 min  Stress: Not on file  Social Connections: Not on file     Family History:  The patient's family history includes Colon cancer in his brother; Heart attack in his mother.  ROS:   12-point review of systems is negative unless otherwise noted in the HPI.   EKGs/Labs/Other Studies Reviewed:    Studies reviewed were summarized above. The additional studies were reviewed today:  LHC 08/11/2021:   LPAV lesion is 100% stenosed.   Prox LAD to Mid LAD lesion is 95% stenosed.   Mid Cx to Dist Cx lesion is 95% stenosed.   2nd Mrg lesion is 60% stenosed.   A drug-eluting stent was successfully placed using a SYNERGY XD 2.50X48.   Post intervention, there is a 0% residual stenosis.    Successful IVUS guided angioplasty and drug-eluting stent placement to the left circumflex using 1 long stent.  Significant vessel size mismatch between the distal and the proximal area.  Distal segment was 2.5 mm in diameter in the proximal area was 3.5 mm in diameter.  The stent was postdilated distally with a 2.75 noncompliant balloon and proximally to 3.5  noncompliant balloon to the maximal stent ID which is 3.5 mm. OM2 has borderline disease proximally but is supplied by good flow from the Y graft from OM 3.   Recommendations: Continue dual antiplatelet therapy for at least 6 months. Aggressive treatment of risk factors. Given underlying chronic kidney disease, will hydrate overnight and recheck renal function in the morning. __________   [facility] 07/26/2021:   LPAV lesion is 100% stenosed.   Prox Cx to Mid Cx lesion is 90% stenosed.   Prox LAD to Mid LAD lesion is 95% stenosed.   1st Mrg lesion is 70% stenosed.   Origin to Prox Graft lesion is 100% stenosed.   Prox RCA to Mid RCA lesion is 100% stenosed.   Origin to Prox Graft lesion is 100% stenosed.   Mid LAD to Dist LAD lesion is 80% stenosed.   Dist LAD lesion is 60% stenosed.   SVG graft was visualized by angiography.   LIMA and is normal in caliber.   The graft exhibits no disease.   The left ventricular systolic function is normal.   LV end diastolic pressure is mildly elevated.   The left ventricular ejection fraction is 55-65% by visual estimate.   1.  Significant underlying three-vessel coronary artery disease with patent LIMA to mid LAD and occluded SVG to distal LAD and SVG to OM1/OM 2.  RCA was not injected and known to be small nondominant and occluded in the midsegment. 2.  Normal LV systolic function mildly elevated left ventricular end-diastolic pressure.   Recommendations: Unfortunately, the LAD is significantly diseased after the LIMA anastomosis and likely not suitable for PCI given extension of disease to the  apical LAD.  Left circumflex can be treated with PCI but at the bifurcation lesion will long segment of disease.  We will place the patient on clopidogrel and planned staged PCI at [facility]. __________   Carlton Adam MPI 06/2021:   Abnormal, probably low risk pharmacologic myocardial perfusion stress test.   There is a small in size, moderate to severe, partially reversible defect involving the apical inferior and mid inferolateral segments most consistent with scar and peri-infarct ischemia, though an element of artifact cannot be excluded.   Left ventricular function is normal (LVEF 60-65%).   Attenuation correction CT demonstrates post CABG findings, coronary artery calcification, and aortic atherosclerosis.   Compared to the prior study of 12/07/2018, mid/apical inferior/inferolateral defect is new. __________   2D echo 09/2020: 1. Left ventricular ejection fraction, by estimation, is 60 to 65%. The  left ventricle has normal function. The left ventricle has no regional  wall motion abnormalities. Left ventricular diastolic parameters are  consistent with Grade I diastolic  dysfunction (impaired relaxation).   2. Right ventricular systolic function is normal. The right ventricular  size is normal. There is normal pulmonary artery systolic pressure. The  estimated right ventricular systolic pressure is 0000000 mmHg.   3. Left atrial size was mildly dilated.   4. The mitral valve is normal in structure. Mild mitral valve  regurgitation.   5. The aortic valve is normal in structure. Aortic valve regurgitation is  mild.  __________   Carlton Adam MPI 11/2018: Pharmacological myocardial perfusion imaging study with no significant  ischemia Normal wall motion, EF estimated at 66% No EKG changes concerning for ischemia at peak stress or in recovery. Nonspecific T wave ABN on resting EKG V3 to V6 Low risk scan   EKG:  EKG is ordered today.  The EKG ordered today demonstrates NSR,  64 bpm, prior  inferior infarct, anterior T wave inversion consistent with prior tracings  Recent Labs: 05/02/2021: ALT 14 05/05/2021: Magnesium 2.5 11/04/2021: B Natriuretic Peptide 180.0; BUN 14; Creatinine, Ser 1.27; Hemoglobin 12.6; Platelets 237; Potassium 3.9; Sodium 135  Recent Lipid Panel    Component Value Date/Time   CHOL 90 05/04/2021 0401   CHOL 111 04/28/2021 0823   TRIG 56 05/04/2021 0401   HDL 19 (L) 05/04/2021 0401   HDL 35 (L) 04/28/2021 0823   CHOLHDL 4.7 05/04/2021 0401   VLDL 11 05/04/2021 0401   LDLCALC 60 05/04/2021 0401   LDLCALC 60 04/28/2021 0823    PHYSICAL EXAM:    VS:  BP 116/64 (BP Location: Left Arm, Patient Position: Sitting, Cuff Size: Normal)   Pulse 64   Ht 6' (1.829 m)   Wt 212 lb (96.2 kg)   BMI 28.75 kg/m   BMI: Body mass index is 28.75 kg/m.  Physical Exam Vitals reviewed.  Constitutional:      Appearance: He is well-developed.  HENT:     Head: Normocephalic and atraumatic.  Eyes:     General:        Right eye: No discharge.        Left eye: No discharge.  Neck:     Vascular: No JVD.  Cardiovascular:     Rate and Rhythm: Normal rate and regular rhythm.     Pulses:          Posterior tibial pulses are 2+ on the right side and 2+ on the left side.     Heart sounds: Normal heart sounds, S1 normal and S2 normal. Heart sounds not distant. No midsystolic click and no opening snap. No murmur heard.   No friction rub.  Pulmonary:     Effort: Pulmonary effort is normal. No respiratory distress.     Breath sounds: Normal breath sounds. No decreased breath sounds, wheezing or rales.  Chest:     Chest wall: No tenderness.  Abdominal:     General: There is no distension.     Palpations: Abdomen is soft.     Tenderness: There is no abdominal tenderness.  Musculoskeletal:     Cervical back: Normal range of motion.     Right lower leg: Edema present.     Left lower leg: Edema present.     Comments: Trivial bilateral ankle edema.  Skin:    General:  Skin is warm and dry.     Nails: There is no clubbing.  Neurological:     Mental Status: He is alert and oriented to person, place, and time.  Psychiatric:        Speech: Speech normal.        Behavior: Behavior normal.        Thought Content: Thought content normal.        Judgment: Judgment normal.    Wt Readings from Last 3 Encounters:  11/25/21 212 lb (96.2 kg)  11/05/21 217 lb (98.4 kg)  11/04/21 212 lb (96.2 kg)     ASSESSMENT & PLAN:   CAD status post CABG status post PCI without angina: He continues to do well without any symptoms concerning for angina or decompensation.  Continue DAPT with aspirin and clopidogrel without interruption for at least 6 months dating back to date of PCI (08/11/2021).  Aggressive secondary prevention and risk factor modification including continuation of carvedilol, atorvastatin, and isosorbide mononitrate.  No indication for further ischemic testing at this time.  HFpEF: He appears  euvolemic and well compensated with NYHA class II symptoms.  His weight is down 4 pounds today when compared to his last clinic visit.  I suspect his pedal edema is multifactorial including underlying osteoarthritis and vasodilatation in the context of amlodipine.  In an effort to maximize GDMT, we will discontinue amlodipine and initiate Entresto 24/26 mg twice daily.  Follow-up BMP 1 week after initiating ARNI.  Moving forward, would look to add SGLT2 inhibitor as vital signs and labs allow.  He remains on low-dose furosemide 20 mg every other day.  He was encouraged to drink approximately 2 L of liquid per day.  HTN: Blood pressure is well controlled in the office today.  Medical therapy as outlined above.  HLD: LDL 60 in 04/2021.  He remains on atorvastatin.    Disposition: F/u with Dr. Fletcher Anon or an APP in 2 months.   Medication Adjustments/Labs and Tests Ordered: Current medicines are reviewed at length with the patient today.  Concerns regarding medicines are  outlined above. Medication changes, Labs and Tests ordered today are summarized above and listed in the Patient Instructions accessible in Encounters.   Signed, Christell Faith, PA-C 11/25/2021 1:52 PM     [place] [address], [place] [address] [phone]

## 2021-11-25 ENCOUNTER — Encounter: Payer: Self-pay | Admitting: Physician Assistant

## 2021-11-25 ENCOUNTER — Ambulatory Visit: Payer: Medicare HMO | Admitting: Physician Assistant

## 2021-11-25 VITALS — BP 116/64 | HR 64 | Ht 72.0 in | Wt 212.0 lb

## 2021-11-25 DIAGNOSIS — I25708 Atherosclerosis of coronary artery bypass graft(s), unspecified, with other forms of angina pectoris: Secondary | ICD-10-CM

## 2021-11-25 DIAGNOSIS — I5032 Chronic diastolic (congestive) heart failure: Secondary | ICD-10-CM

## 2021-11-25 DIAGNOSIS — I10 Essential (primary) hypertension: Secondary | ICD-10-CM

## 2021-11-25 DIAGNOSIS — E785 Hyperlipidemia, unspecified: Secondary | ICD-10-CM

## 2021-11-25 MED ORDER — ENTRESTO 24-26 MG PO TABS: 1.0000 | ORAL_TABLET | Freq: Two times a day (BID) | ORAL | 1 refills | Status: AC

## 2021-11-25 NOTE — Patient Instructions (Addendum)
Medication Instructions:  Your physician has recommended you make the following change in your medication:   STOP Amlodipine   START (TOMORROW 11/26/21) Entresto 24-26 mg TWICE daily - Samples given today in office and new Rx has been sent to your pharmacy  *If you need a refill on your cardiac medications before your next appointment, please call your pharmacy*   Lab Work:  Your physician recommends that you return to the Medical Mall at [facility] or lab work in: 1 WEEK  BMET - You do NOT have to fast for this lab  -  Please go to the Medical Mall Entrance at [facility] -  Check in at the Registration Desk: 1st desk to the right, past the screening table -  Valet Parking is offered if needed -  No appointment needed -  Lab hours: Monday- Friday (7:30 am- 5:30 pm)    Testing/Procedures:  None ordered   Follow-Up: At [facility], you and your health needs are our priority.  As part of our continuing mission to provide you with exceptional heart care, we have created designated Provider Care Teams.  These Care Teams include your primary Cardiologist (physician) and Advanced Practice Providers (APPs -  Physician Assistants and Nurse Practitioners) who all work together to provide you with the care you need, when you need it.  We recommend signing up for the patient portal called "MyChart".  Sign up information is provided on this After Visit Summary.  MyChart is used to connect with patients for Virtual Visits (Telemedicine).  Patients are able to view lab/test results, encounter notes, upcoming appointments, etc.  Non-urgent messages can be sent to your provider as well.   To learn more about what you can do with MyChart, go to [email or web address].    Your next appointment:   2 month(s)  The format for your next appointment:   In Person  Provider:   You may see Eula Listen, PA-C    Important Information About Sugar

## 2021-12-02 ENCOUNTER — Other Ambulatory Visit
Admission: RE | Admit: 2021-12-02 | Discharge: 2021-12-02 | Disposition: A | Discharge status: Home / Self Care | Payer: Medicare HMO | Attending: Physician Assistant | Admitting: Physician Assistant

## 2021-12-02 DIAGNOSIS — I5032 Chronic diastolic (congestive) heart failure: Secondary | ICD-10-CM | POA: Diagnosis present

## 2021-12-02 DIAGNOSIS — I25708 Atherosclerosis of coronary artery bypass graft(s), unspecified, with other forms of angina pectoris: Secondary | ICD-10-CM | POA: Diagnosis present

## 2021-12-02 LAB — BASIC METABOLIC PANEL
Anion gap: 5 (ref 5–15)
BUN: 13 mg/dL (ref 8–23)
CO2: 26 mmol/L (ref 22–32)
Calcium: 8.7 mg/dL — ABNORMAL LOW (ref 8.9–10.3)
Chloride: 106 mmol/L (ref 98–111)
Creatinine, Ser: 1.2 mg/dL (ref 0.61–1.24)
GFR, Estimated: >60 mL/min (ref >60)
Glucose, Bld: 123 mg/dL — ABNORMAL HIGH (ref 70–99)
Potassium: 4 mmol/L (ref 3.5–5.1)
Sodium: 137 mmol/L (ref 135–145)

## 2022-01-05 ENCOUNTER — Other Ambulatory Visit: Payer: Self-pay | Admitting: Family

## 2022-01-16 NOTE — Progress Notes (Signed)
Patient ID: Dale Swigert., male    DOB: Jan 29, 1946, 76 y.o.   MRN: [id number]  HPI  Dale Cook is a 76 y/o male with a history of CAD, hyperlipidemia, HTN, arthritis and chronic heart failure.   Echo report from 10/15/20 reviewed and showed an EF of 60-65% along with normal PA Pressure, mild LAE and mild Dale.   LHC done 01/09/17 and showed: Prox Cx to Mid Cx lesion, 80 %stenosed. Ost 1st Mrg to 1st Mrg lesion, 60 %stenosed. Mid Cx lesion, 100 %stenosed. Ost LAD to Prox LAD lesion, 95 %stenosed. Mid LAD lesion, 60 %stenosed. Dist LAD-2 lesion, 60 %stenosed. Dist LAD-1 lesion, 90 %stenosed. Ost 2nd Mrg to 2nd Mrg lesion, 90 %stenosed. Prox RCA to Mid RCA lesion, 99 %stenosed. The left ventricular systolic function is normal. LV end diastolic pressure is normal. The left ventricular ejection fraction is 55-65% by visual estimate.   1. 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 2. Normal LV systolic function and high normal left ventricular end-diastolic pressure.  Was in the ED 11/03/21 due to acute on chronic heart failure. Gave a dose of IV lasix and he was released. Was in the ED 09/22/21 due to acute bilateral foot pain. Xray of bilateral ankle reveals subacute to chronic nondisplaced fracture of the distal tip of both medial malleoli. 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 Released with orthopaedic f/u.   He presents today for a follow-up visit with a chief complaint of minimal shortness of breath with moderate exertion. Describes this as chronic in nature. Has associated dry cough, fingertip numbness, left knee pain and gradual weight gain along with this. He denies any difficulty sleeping, dizziness, abdominal distention, palpitations, pedal edema, chest pain or fatigue.   He says that he's concerned about his blood pressure as it's getting lower. Says that he checks it at home and before his  medications, the top number is ~ 120's but then after he takes his medications, it drops to low 100's. No dizziness to report.   Feels like his cough may be due to allergies because when he clears his throat, the cough improves. Has been taking OTC claritin for "awhile" and previously took zyrtec.   Past Medical History:  Diagnosis Date   Arthritis    "hands, knees" (01/09/2017)   CAD (coronary artery disease)    a. 12/2016 Cath: LM nl, LAD 95p, 24m, 60/90d, LCX 80p/m, 154m, OM1 23m, OM2 90, RCA 99p, Ef 55-65%; b. 12/2016 CABG x 4 (LIMA->mLAD, VG->dLAD, VG->OM1->OM2); c. 11/2018 MV: EF 66%, no ischemia.   Cardiac murmur    Grade II/VI early peaking systolic in aortic area   CHF (congestive heart failure) (HCC)    Essential hypertension    GERD (gastroesophageal reflux disease)    High cholesterol    History of echocardiogram    a. 12/2016 Echo: EF 65-70%.   Left carotid bruit    Wears dentures    partial upper   Past Surgical History:  Procedure Laterality Date   CATARACT EXTRACTION W/PHACO Left 06/12/2020   Procedure: CATARACT EXTRACTION PHACO AND INTRAOCULAR LENS PLACEMENT (IOC) LEFT 4.36 00:51.9 8.4%;  Surgeon: Lockie Mola, MD;  Location: [facility];  Service: Ophthalmology;  Laterality: Left;   COLONOSCOPY WITH PROPOFOL N/A 11/09/2016   Procedure: COLONOSCOPY WITH PROPOFOL;  Surgeon: Earline Mayotte, MD;  Location: [facility];  Service: Endoscopy;  Laterality: N/A;   COLONOSCOPY WITH PROPOFOL N/A 11/29/2019  Procedure: COLONOSCOPY WITH PROPOFOL;  Surgeon: Robert Bellow, MD;  Location: [facility];  Service: Endoscopy;  Laterality: N/A;   CORONARY ARTERY BYPASS GRAFT N/A 01/12/2017   CABG x 4: LIMA to mLAD, SVG to dLAD, sequential SVG to OM1-OM2, EVH via left thigh and leg;  Surgeon: Rexene Alberts, MD;  Location: [place];  Service: Open Heart Surgery;  Laterality: N/A;   CORONARY STENT INTERVENTION N/A 08/11/2021   Procedure: CORONARY STENT  INTERVENTION;  Surgeon: Wellington Hampshire, MD;  Location: [place] [facility];  Service: Cardiovascular;  Laterality: N/A;   INTRAOPERATIVE TRANSESOPHAGEAL ECHOCARDIOGRAM N/A 01/12/2017   Procedure: INTRAOPERATIVE TRANSESOPHAGEAL ECHOCARDIOGRAM;  Surgeon: Rexene Alberts, MD;  Location: [place];  Service: Open Heart Surgery;  Laterality: N/A;   INTRAVASCULAR ULTRASOUND/IVUS N/A 08/11/2021   Procedure: Intravascular Ultrasound/IVUS;  Surgeon: Wellington Hampshire, MD;  Location: [place] [facility];  Service: Cardiovascular;  Laterality: N/A;   LEFT HEART CATH AND CORONARY ANGIOGRAPHY Left 01/09/2017   Procedure: Left Heart Cath and Coronary Angiography;  Surgeon: Wellington Hampshire, MD;  Location: [place] [facility];  Service: Cardiovascular;  Laterality: Left;   LEFT HEART CATH AND CORS/GRAFTS ANGIOGRAPHY N/A 07/26/2021   Procedure: LEFT HEART CATH AND CORS/GRAFTS ANGIOGRAPHY;  Surgeon: Wellington Hampshire, MD;  Location: [place] [facility];  Service: Cardiovascular;  Laterality: N/A;   TOTAL KNEE ARTHROPLASTY Right 01/18/2021   Procedure: TOTAL KNEE ARTHROPLASTY;  Surgeon: Lovell Sheehan, MD;  Location: [facility];  Service: Orthopedics;  Laterality: Right;   Family History  Problem Relation Age of Onset   Heart attack Mother    Colon cancer Brother    Social History   Tobacco Use   Smoking status: Never   Smokeless tobacco: Never  Substance Use Topics   Alcohol use: No   No Known Allergies  Prior to Admission medications   Medication Sig Start Date End Date Taking? Authorizing Provider  aspirin 81 MG chewable tablet Chew 81 mg by mouth daily.   Yes [provider]  atorvastatin (LIPITOR) 40 MG tablet Take 40 mg by mouth daily.   Yes [provider]  carvedilol (COREG) 25 MG tablet TAKE 1 TABLET TWICE DAILY 10/13/21  Yes Wellington Hampshire, MD  clopidogrel (PLAVIX) 75 MG tablet Take 1 tablet (75 mg total) by mouth daily. 07/26/21 07/26/22 Yes Wellington Hampshire, MD  furosemide  (LASIX) 20 MG tablet TAKE 1 TABLET EVERY DAY 01/05/22  Yes Darylene Price A, FNP  isosorbide mononitrate (IMDUR) 30 MG 24 hr tablet Take 1 tablet (30 mg total) by mouth daily. 09/20/21  Yes Dunn, Areta Haber, PA-C  loratadine (CLARITIN) 10 MG tablet Take 10 mg by mouth daily as needed for allergies.   Yes [provider]  pantoprazole (PROTONIX) 40 MG tablet Take 1 tablet (40 mg total) by mouth daily. 06/04/21  Yes Dunn, Ryan M, PA-C  sacubitril-valsartan (ENTRESTO) 24-26 MG Take 1 tablet by mouth 2 (two) times daily. 11/25/21 01/24/22 Yes Dunn, Areta Haber, PA-C  acetaminophen (TYLENOL) 325 MG tablet Take 2 tablets (650 mg total) by mouth every 4 (four) hours as needed for headache or mild pain. Patient not taking: Reported on 01/17/2022 08/12/21   Barrett, Evelene Croon, PA-C    Review of Systems  Constitutional:  Negative for appetite change and fatigue.  HENT:  Negative for congestion, postnasal drip and sore throat.   Eyes: Negative.   Respiratory:  Positive for cough and shortness of breath (with moderate exertion).  Cardiovascular:  Negative for chest pain, palpitations and leg swelling.  Gastrointestinal:  Negative for abdominal distention and abdominal pain.  Endocrine: Negative.   Genitourinary: Negative.   Musculoskeletal:  Positive for arthralgias (left knee). Negative for back pain.  Skin: Negative.   Allergic/Immunologic: Negative.   Neurological:  Positive for numbness (in right fingertips ever since bypass). Negative for dizziness and light-headedness.  Hematological:  Negative for adenopathy. Does not bruise/bleed easily.  Psychiatric/Behavioral:  Negative for dysphoric mood and sleep disturbance (sleeping on 1 pillow). The patient is not nervous/anxious.    Vitals:   01/17/22 0940  BP: 105/61  Pulse: 73  Resp: 18  SpO2: 99%  Weight: 223 lb (101.2 kg)  Height: 6' (1.829 m)   Wt Readings from Last 3 Encounters:  01/17/22 223 lb (101.2 kg)  11/25/21 212 lb (96.2 kg)  11/05/21  217 lb (98.4 kg)   Lab Results  Component Value Date   CREATININE 1.20 12/02/2021   CREATININE 1.27 (H) 11/04/2021   CREATININE 1.24 08/25/2021   Physical Exam Vitals and nursing note reviewed.  Constitutional:      Appearance: Normal appearance.  HENT:     Head: Normocephalic and atraumatic.  Cardiovascular:     Rate and Rhythm: Normal rate and regular rhythm.  Pulmonary:     Effort: Pulmonary effort is normal. No respiratory distress.     Breath sounds: No wheezing or rales.  Abdominal:     General: There is no distension.     Palpations: Abdomen is soft.  Musculoskeletal:        General: No tenderness.     Cervical back: Normal range of motion and neck supple.     Right lower leg: Edema (trace pitting) present.     Left lower leg: Edema (trace pitting) present.  Skin:    General: Skin is warm and dry.  Neurological:     General: No focal deficit present.     Mental Status: He is alert and oriented to person, place, and time.  Psychiatric:        Mood and Affect: Mood normal.        Behavior: Behavior normal.        Thought Content: Thought content normal.    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 (mild LVH)- - NYHA class II - euvolemic today - weighing daily and he notes gradual weight gain; reminded to call for an overnight weight gain of > 2 pounds or a weekly weight gain of > 5 pounds - weight up 6 pounds from last visit 2 months ago   - pedal edema improved since amlodipine stopped - not adding salt and has been reading food labels for sodium content; reviewed the importance of closely following a 2000mg  sodium diet  - on GDMT of entresto - BP may be too low for SGLT2 without decreasing diuretic; hesitant to decrease diuretic today since his weight is up and he has a cough - currently taking furosemide every other day - reminded to keep daily fluid intake to 60-64 ounces daily; he says that he  knows he's probably drinking too much fluids in addition to watermelon etc; reviewed the importance of decreasing oral fluids if he eats a lot of watermelon or other juicy fruits - he can try switching antihistamine to zyrtec, allergra or xyzal and see if that helps his cough - BNP 11/04/21 was 180  2: HTN- - BP looks good (105/61) -  saw PCP Hall Busing) April 2023 & returns 02/04/22 - BMP 12/02/21 reviewed and showed sodium 137, potassium 4.0, creatinine 1.2 and GFR 60  3: CAD- - has had quadruple CABG years ago - on aspirin and atorvastatin  - saw cardiology (Dunn) 11/25/21   Medication bottles reviewed.   Return in 3 months, sooner if needed.

## 2022-01-17 ENCOUNTER — Ambulatory Visit: Payer: Medicare HMO | Attending: Family | Admitting: Family

## 2022-01-17 ENCOUNTER — Encounter: Payer: Self-pay | Admitting: Family

## 2022-01-17 VITALS — BP 105/61 | HR 73 | Resp 18 | Ht 72.0 in | Wt 223.0 lb

## 2022-01-17 DIAGNOSIS — Z8249 Family history of ischemic heart disease and other diseases of the circulatory system: Secondary | ICD-10-CM | POA: Diagnosis not present

## 2022-01-17 DIAGNOSIS — I2510 Atherosclerotic heart disease of native coronary artery without angina pectoris: Secondary | ICD-10-CM | POA: Insufficient documentation

## 2022-01-17 DIAGNOSIS — Z7982 Long term (current) use of aspirin: Secondary | ICD-10-CM | POA: Insufficient documentation

## 2022-01-17 DIAGNOSIS — I10 Essential (primary) hypertension: Secondary | ICD-10-CM

## 2022-01-17 DIAGNOSIS — I5032 Chronic diastolic (congestive) heart failure: Secondary | ICD-10-CM | POA: Insufficient documentation

## 2022-01-17 DIAGNOSIS — Z7902 Long term (current) use of antithrombotics/antiplatelets: Secondary | ICD-10-CM | POA: Diagnosis not present

## 2022-01-17 DIAGNOSIS — I25708 Atherosclerosis of coronary artery bypass graft(s), unspecified, with other forms of angina pectoris: Secondary | ICD-10-CM

## 2022-01-17 DIAGNOSIS — Z951 Presence of aortocoronary bypass graft: Secondary | ICD-10-CM | POA: Diagnosis not present

## 2022-01-17 DIAGNOSIS — Z955 Presence of coronary angioplasty implant and graft: Secondary | ICD-10-CM | POA: Insufficient documentation

## 2022-01-17 DIAGNOSIS — I110 Hypertensive heart disease with heart failure: Secondary | ICD-10-CM | POA: Diagnosis not present

## 2022-01-17 DIAGNOSIS — Z79899 Other long term (current) drug therapy: Secondary | ICD-10-CM | POA: Insufficient documentation

## 2022-01-17 DIAGNOSIS — M150 Primary generalized (osteo)arthritis: Secondary | ICD-10-CM | POA: Insufficient documentation

## 2022-01-17 DIAGNOSIS — E7800 Pure hypercholesterolemia, unspecified: Secondary | ICD-10-CM | POA: Insufficient documentation

## 2022-01-17 NOTE — Patient Instructions (Addendum)
Continue weighing daily and call for an overnight weight gain of 3 pounds or more or a weekly weight gain of more than 5 pounds.   If you have voicemail, please make sure your mailbox is cleaned out so that we may leave a message and please make sure to listen to any voicemails.    Keep fluid intake to 60-64 ounces a day.

## 2022-01-25 NOTE — Progress Notes (Unsigned)
Cardiology Office Note    Date:  01/27/2022   ID:  Dale Voght., DOB 1946-04-10, MRN [id number]  PCP:  [name], MD  Cardiologist:  [name], MD  Electrophysiologist:  None   Chief Complaint: Follow-up  History of Present Illness:   Dale Kuhlman. is a 76 y.o. male with history of CAD status post CABG in 12/2016 s/p PCI/DES to the LCx on 08/11/2021, HFpEF, HTN, and HLD who presents for follow up of CAD and HFpEF.    He presented in 2018 with unstable angina with LHC showing severe three-vessel CAD.  He subsequently underwent four-vessel CABG in 12/2016 with LIMA to mid LAD, SVG to distal LAD, jump graft with SVG to OM1 and OM 2.  Carotid artery ultrasound prior to surgery showed no significant disease.  Echo showed normal LV systolic function with no significant valvular abnormalities.  Lexiscan MPI on 07/13/2021 showed a small in size, moderate in severity, partially reversible defect involving the apical inferior and mid inferolateral segments most consistent with scar and peri-infarct ischemia.  Compared to prior study in 2020, the mid/apical inferior, inferolateral defect was new. LVEF normal. Due to persistent symptoms, he underwent LHC on 07/26/2021, that showed significant underlying 3-vessel CAD with patent LIMA to mid LAD, and occluded SVG to distal LAD and SVG to OM1/OM2. The RCA was not injected and known to be a small nondominant vessel and occluded in the mid segment.  There was normal LVSF and mildly elevated LVEDP. The LAD was significantly diseased after the LIMA anastomosis and likely not suitable for PCI given the extension of disease to the apical LAD. The LCx could be treated with PCI, but at the bifurcation lesion would require a long segment stenting with recommendation for staged PCI at [facility].     He was admitted to [facility] from 2/9 to 08/06/2021 with dyspnea concerning for anginal equivalent along with volume depletion. Imdur was added and  ultimately, the decision was made to move forward with planned LHC with Dr. Kirke Corin at [facility] the following week.    He underwent planned repeat LHC at [facility] on 08/11/2021 with successful IVUS-guide PCI/DES to the LCx using 1 long stent ([id number]). There was significant vessel size mismatch between the distal and proximal area with the distal segment 2.5 mm in diameter and the proximal area 3.5 mm in diameter. The stent was post-dilated to the maximal stent ID, 3.5 mm. The OM2 had borderline disease proximally, though was supplied by good flow from the Y graft from OM3.     He was seen in the office on 08/25/2021 and was without symptoms of angina or decompensation.  Since undergoing PCI to the LCx, he noted a significant improvement in his exertional dyspnea.  His functional status was improving.   He was seen in the ED on 09/22/2021 with intermittent ankle and feet swelling.  BNP 131.  CRP and sed rate elevated.  X-ray revealed subacute to chronic nondisplaced fracture of the distal tip of both medial malleoli along with osteoarthritis noted bilaterally.  Symptoms were felt to be orthopedic in etiology.   He was seen in the ED on 11/04/2021 with shortness of breath and intermittent lower extremity swelling.  High-sensitivity troponin negative.  BNP 180.  He was given IV Lasix in the ED with recommendation for outpatient follow-up.  He was last seen in the office on 11/25/2021, and was without symptoms of angina or decompensation.  He continued to note  intermittent bilateral ankle/pedal edema as well as bilateral knee and foot pain that were attributed to osteoarthritis.  His weight was down 4 pounds when compared to his prior visit.  Amlodipine was discontinued and he was initiated on Entresto.  He comes in doing well from a cardiac perspective, and is without symptoms of angina or decompensation.  No dyspnea, palpitations, dizziness, presyncope, or syncope.  No falls, hematochezia, or melena.  No  significant lower extremity swelling.  No orthopnea, PND, early satiety.  He does not add salt to food.  He does drink a little over 2 L of liquids daily.  His weight is up 12 pounds by our scale today, which he attributes to caloric intake.  He does not feel like he is volume up.  Since discontinuing amlodipine, he has noted resolution of pedal swelling.  He is hopeful to undergo knee surgery during the first part of 2024.  He will also be seeing a specialist for his eyelids and is uncertain if this is something that can be managed conservatively or will require intervention.  He does not have any active cardiac issues or concerns at this time.   Labs independently reviewed: 11/2021 - potassium 4.0, BUN 13, serum creatinine 1.2 10/2021 - Hgb 12.6, PLT 237 04/2021 - magnesium 2.5, TC 90, TG 56, HDL 19, LDL 60, A1c 6.6, albumin 3.1, AST/ALT normal 11/2018 - TSH normal   Past Medical History:  Diagnosis Date   Arthritis    "hands, knees" (01/09/2017)   CAD (coronary artery disease)    a. 12/2016 Cath: LM nl, LAD 95p, 63m, 60/90d, LCX 80p/m, 174m, OM1 5m, OM2 90, RCA 99p, Ef 55-65%; b. 12/2016 CABG x 4 (LIMA->mLAD, VG->dLAD, VG->OM1->OM2); c. 11/2018 MV: EF 66%, no ischemia.   Cardiac murmur    Grade II/VI early peaking systolic in aortic area   CHF (congestive heart failure) (HCC)    Essential hypertension    GERD (gastroesophageal reflux disease)    High cholesterol    History of echocardiogram    a. 12/2016 Echo: EF 65-70%.   Left carotid bruit    Wears dentures    partial upper    Past Surgical History:  Procedure Laterality Date   CATARACT EXTRACTION W/PHACO Left 06/12/2020   Procedure: CATARACT EXTRACTION PHACO AND INTRAOCULAR LENS PLACEMENT (IOC) LEFT 4.36 00:51.9 8.4%;  Surgeon: Leandrew Koyanagi, MD;  Location: [place];  Service: Ophthalmology;  Laterality: Left;   COLONOSCOPY WITH PROPOFOL N/A 11/09/2016   Procedure: COLONOSCOPY WITH PROPOFOL;  Surgeon: Robert Bellow, MD;  Location: [facility];  Service: Endoscopy;  Laterality: N/A;   COLONOSCOPY WITH PROPOFOL N/A 11/29/2019   Procedure: COLONOSCOPY WITH PROPOFOL;  Surgeon: Robert Bellow, MD;  Location: [facility];  Service: Endoscopy;  Laterality: N/A;   CORONARY ARTERY BYPASS GRAFT N/A 01/12/2017   CABG x 4: LIMA to mLAD, SVG to dLAD, sequential SVG to OM1-OM2, EVH via left thigh and leg;  Surgeon: Rexene Alberts, MD;  Location: [place];  Service: Open Heart Surgery;  Laterality: N/A;   CORONARY STENT INTERVENTION N/A 08/11/2021   Procedure: CORONARY STENT INTERVENTION;  Surgeon: Wellington Hampshire, MD;  Location: [place] [facility];  Service: Cardiovascular;  Laterality: N/A;   INTRAOPERATIVE TRANSESOPHAGEAL ECHOCARDIOGRAM N/A 01/12/2017   Procedure: INTRAOPERATIVE TRANSESOPHAGEAL ECHOCARDIOGRAM;  Surgeon: Rexene Alberts, MD;  Location: [place];  Service: Open Heart Surgery;  Laterality: N/A;   INTRAVASCULAR ULTRASOUND/IVUS N/A 08/11/2021   Procedure: Intravascular Ultrasound/IVUS;  Surgeon: Kathlyn Sacramento  A, MD;  [facility];  Service: Cardiovascular;  Laterality: N/A;   LEFT HEART CATH AND CORONARY ANGIOGRAPHY Left 01/09/2017   Procedure: Left Heart Cath and Coronary Angiography;  Surgeon: Iran Ouch, MD;  Location: [facility];  Service: Cardiovascular;  Laterality: Left;   LEFT HEART CATH AND CORS/GRAFTS ANGIOGRAPHY N/A 07/26/2021   Procedure: LEFT HEART CATH AND CORS/GRAFTS ANGIOGRAPHY;  Surgeon: Iran Ouch, MD;  Location: [facility];  Service: Cardiovascular;  Laterality: N/A;   TOTAL KNEE ARTHROPLASTY Right 01/18/2021   Procedure: TOTAL KNEE ARTHROPLASTY;  Surgeon: Lyndle Herrlich, MD;  Location: [facility];  Service: Orthopedics;  Laterality: Right;    Current Medications: Current Meds  Medication Sig   acetaminophen (TYLENOL) 325 MG tablet Take 2 tablets (650 mg total) by mouth every 4 (four) hours as needed for headache or mild  pain.   aspirin 81 MG chewable tablet Chew 81 mg by mouth daily.   atorvastatin (LIPITOR) 40 MG tablet Take 40 mg by mouth daily.   carvedilol (COREG) 25 MG tablet TAKE 1 TABLET TWICE DAILY   clopidogrel (PLAVIX) 75 MG tablet Take 1 tablet (75 mg total) by mouth daily.   furosemide (LASIX) 20 MG tablet Take 20 mg by mouth every other day.   isosorbide mononitrate (IMDUR) 30 MG 24 hr tablet Take 1 tablet (30 mg total) by mouth daily.   loratadine (CLARITIN) 10 MG tablet Take 10 mg by mouth daily as needed for allergies.   pantoprazole (PROTONIX) 40 MG tablet Take 1 tablet (40 mg total) by mouth daily.   sacubitril-valsartan (ENTRESTO) 24-26 MG Take 1 tablet by mouth 2 (two) times daily.    Allergies:   Patient has no known allergies.   Social History   Socioeconomic History   Marital status: Divorced    Spouse name: Not on file   Number of children: 3   Years of education: Not on file   Highest education level: Not on file  Occupational History   Not on file  Tobacco Use   Smoking status: Never   Smokeless tobacco: Never  Vaping Use   Vaping Use: Never used  Substance and Sexual Activity   Alcohol use: No   Drug use: No   Sexual activity: Not Currently  Other Topics Concern   Not on file  Social History Narrative   Not on file   Social Determinants of Health   Financial Resource Strain: Not on file  Food Insecurity: Not on file  Transportation Needs: Not on file  Physical Activity: Sufficiently Active (12/23/2020)   Exercise Vital Sign    Days of Exercise per Week: 7 days    Minutes of Exercise per Session: 120 min  Stress: Not on file  Social Connections: Not on file     Family History:  The patient's family history includes Colon cancer in his brother; Heart attack in his mother.  ROS:   12-point review of systems is negative unless otherwise noted in HPI.   EKGs/Labs/Other Studies Reviewed:    Studies reviewed were summarized above. The additional studies  were reviewed today:  LHC 08/11/2021:   LPAV lesion is 100% stenosed.   Prox LAD to Mid LAD lesion is 95% stenosed.   Mid Cx to Dist Cx lesion is 95% stenosed.   2nd Mrg lesion is 60% stenosed.   A drug-eluting stent was successfully placed using a SYNERGY XD 2.50X48.   Post intervention, there is a 0% residual  stenosis.   Successful IVUS guided angioplasty and drug-eluting stent placement to the left circumflex using 1 long stent.  Significant vessel size mismatch between the distal and the proximal area.  Distal segment was 2.5 mm in diameter in the proximal area was 3.5 mm in diameter.  The stent was postdilated distally with a 2.75 noncompliant balloon and proximally to 3.5 noncompliant balloon to the maximal stent ID which is 3.5 mm. OM2 has borderline disease proximally but is supplied by good flow from the Y graft from OM 3.   Recommendations: Continue dual antiplatelet therapy for at least 6 months. Aggressive treatment of risk factors. Given underlying chronic kidney disease, will hydrate overnight and recheck renal function in the morning. __________   [facility] 07/26/2021:   LPAV lesion is 100% stenosed.   Prox Cx to Mid Cx lesion is 90% stenosed.   Prox LAD to Mid LAD lesion is 95% stenosed.   1st Mrg lesion is 70% stenosed.   Origin to Prox Graft lesion is 100% stenosed.   Prox RCA to Mid RCA lesion is 100% stenosed.   Origin to Prox Graft lesion is 100% stenosed.   Mid LAD to Dist LAD lesion is 80% stenosed.   Dist LAD lesion is 60% stenosed.   SVG graft was visualized by angiography.   LIMA and is normal in caliber.   The graft exhibits no disease.   The left ventricular systolic function is normal.   LV end diastolic pressure is mildly elevated.   The left ventricular ejection fraction is 55-65% by visual estimate.   1.  Significant underlying three-vessel coronary artery disease with patent LIMA to mid LAD and occluded SVG to distal LAD and SVG to OM1/OM 2.  RCA was not  injected and known to be small nondominant and occluded in the midsegment. 2.  Normal LV systolic function mildly elevated left ventricular end-diastolic pressure.   Recommendations: Unfortunately, the LAD is significantly diseased after the LIMA anastomosis and likely not suitable for PCI given extension of disease to the apical LAD.  Left circumflex can be treated with PCI but at the bifurcation lesion will long segment of disease.  We will place the patient on clopidogrel and planned staged PCI at [facility]. __________   Carl[facility] Adam MPI 06/2021:   Abnormal, probably low risk pharmacologic myocardial perfusion stress test.   There is a small in size, moderate to severe, partially reversible defect involving the apical inferior and mid inferolateral segments most consistent with scar and peri-infarct ischemia, though an element of artifact cannot be excluded.   Left ventricular function is normal (LVEF 60-65%).   Attenuation correction CT demonstrates post CABG findings, coronary artery calcification, and aortic atherosclerosis.   Compared to the prior study of 12/07/2018, mid/apical inferior/inferolateral defect is new. __________   2D echo 09/2020: 1. Left ventricular ejection fraction, by estimation, is 60 to 65%. The  left ventricle has normal function. The left ventricle has no regional  wall motion abnormalities. Left ventricular diastolic parameters are  consistent with Grade I diastolic  dysfunction (impaired relaxation).   2. Right ventricular systolic function is normal. The right ventricular  size is normal. There is normal pulmonary artery systolic pressure. The  estimated right ventricular systolic pressure is 0000000 mmHg.   3. Left atrial size was mildly dilated.   4. The mitral valve is normal in structure. Mild mitral valve  regurgitation.   5. The aortic valve is normal in structure. Aortic valve regurgitation is  mild.  __________   [facility] MPI 11/2018: Pharmacological  myocardial perfusion imaging study with no significant  ischemia Normal wall motion, EF estimated at 66% No EKG changes concerning for ischemia at peak stress or in recovery. Nonspecific T wave ABN on resting EKG V3 to V6 Low risk scan   EKG:  EKG is ordered today.  The EKG ordered today demonstrates NSR, 73 bpm, occasional PACs, prior inferior infarct, nonspecific ST-T changes consistent with prior tracings  Recent Labs: 05/02/2021: ALT 14 05/05/2021: Magnesium 2.5 11/04/2021: B Natriuretic Peptide 180.0; Hemoglobin 12.6; Platelets 237 12/02/2021: BUN 13; Creatinine, Ser 1.20; Potassium 4.0; Sodium 137  Recent Lipid Panel    Component Value Date/Time   CHOL 90 05/04/2021 0401   CHOL 111 04/28/2021 0823   TRIG 56 05/04/2021 0401   HDL 19 (L) 05/04/2021 0401   HDL 35 (L) 04/28/2021 0823   CHOLHDL 4.7 05/04/2021 0401   VLDL 11 05/04/2021 0401   LDLCALC 60 05/04/2021 0401   LDLCALC 60 04/28/2021 0823    PHYSICAL EXAM:    VS:  BP 138/72 (BP Location: Left Arm, Patient Position: Sitting, Cuff Size: Normal)   Pulse 73   Ht 6' (1.829 m)   Wt 224 lb 6 oz (101.8 kg)   SpO2 98%   BMI 30.43 kg/m   BMI: Body mass index is 30.43 kg/m.  Physical Exam Vitals reviewed.  Constitutional:      Appearance: He is well-developed.  HENT:     Head: Normocephalic and atraumatic.  Eyes:     General:        Right eye: No discharge.        Left eye: No discharge.  Neck:     Vascular: No JVD.  Cardiovascular:     Rate and Rhythm: Normal rate and regular rhythm.     Pulses:          Posterior tibial pulses are 2+ on the right side and 2+ on the left side.     Heart sounds: Normal heart sounds, S1 normal and S2 normal. Heart sounds not distant. No midsystolic click and no opening snap. No murmur heard.    No friction rub.  Pulmonary:     Effort: Pulmonary effort is normal. No respiratory distress.     Breath sounds: Normal breath sounds. No decreased breath sounds, wheezing or rales.  Chest:      Chest wall: No tenderness.  Abdominal:     General: There is no distension.     Palpations: Abdomen is soft.     Tenderness: There is no abdominal tenderness.  Musculoskeletal:     Cervical back: Normal range of motion.     Right lower leg: No edema.     Left lower leg: No edema.  Skin:    General: Skin is warm and dry.     Nails: There is no clubbing.  Neurological:     Mental Status: He is alert and oriented to person, place, and time.  Psychiatric:        Speech: Speech normal.        Behavior: Behavior normal.        Thought Content: Thought content normal.        Judgment: Judgment normal.     Wt Readings from Last 3 Encounters:  01/27/22 224 lb 6 oz (101.8 kg)  01/17/22 223 lb (101.2 kg)  11/25/21 212 lb (96.2 kg)     ASSESSMENT & PLAN:   CAD status post CABG status post PCI  without angina: He continues to do very well from a cardiac perspective without symptoms of angina or decompensation.  He remains on DAPT with aspirin and clopidogrel without interruption for at least 6 months dating back to date of PCI (08/11/2021).  Favor continuing DAPT there after, as tolerated, given prior CABG with subsequent PCI.  Continue aggressive secondary prevention and risk factor modification including carvedilol, atorvastatin, and Imdur.  No indication for further ischemic testing at this time.  HFpEF: He appears euvolemic and well compensated with NYHA class II symptoms.  His weight is up today, though he does not appear or feel like he is volume up.  Pedal edema has resolved following discontinuation of amlodipine.  Continue current medical therapy including carvedilol, furosemide, and Entresto.  Recent renal function and electrolytes stable.  CHF education was discussed in detail with recommendation to minimize sodium intake and drink less than 2 L of liquid per day.  HTN: Blood pressure is reasonably controlled in the office today.  Continue current medical therapy.  HLD: LDL 60 in  04/2021.  He remains on atorvastatin 40 mg.   Disposition: F/u with Dr. Fletcher Anon or an APP in 6 months.   Medication Adjustments/Labs and Tests Ordered: Current medicines are reviewed at length with the patient today.  Concerns regarding medicines are outlined above. Medication changes, Labs and Tests ordered today are summarized above and listed in the Patient Instructions accessible in Encounters.   Signed, Christell Faith, PA-C 01/27/2022 2:11 PM     [address] [place], [place] [address] [phone]

## 2022-01-27 ENCOUNTER — Ambulatory Visit: Payer: Medicare HMO | Admitting: Physician Assistant

## 2022-01-27 ENCOUNTER — Encounter: Payer: Self-pay | Admitting: Physician Assistant

## 2022-01-27 VITALS — BP 138/72 | HR 73 | Ht 72.0 in | Wt 224.4 lb

## 2022-01-27 DIAGNOSIS — E785 Hyperlipidemia, unspecified: Secondary | ICD-10-CM

## 2022-01-27 DIAGNOSIS — I5032 Chronic diastolic (congestive) heart failure: Secondary | ICD-10-CM | POA: Diagnosis not present

## 2022-01-27 DIAGNOSIS — I25810 Atherosclerosis of coronary artery bypass graft(s) without angina pectoris: Secondary | ICD-10-CM | POA: Diagnosis not present

## 2022-01-27 DIAGNOSIS — I10 Essential (primary) hypertension: Secondary | ICD-10-CM

## 2022-01-27 MED ORDER — ENTRESTO 24-26 MG PO TABS
1.0000 | ORAL_TABLET | Freq: Two times a day (BID) | ORAL | 3 refills | Status: DC
Start: 2022-01-27 — End: 2022-11-11

## 2022-01-27 NOTE — Patient Instructions (Signed)
Medication Instructions:  ?No changes at this time.  ? ?*If you need a refill on your cardiac medications before your next appointment, please call your pharmacy* ? ? ?Lab Work: ?None ? ?If you have labs (blood work) drawn today and your tests are completely normal, you will receive your results only by: ?MyChart Message (if you have MyChart) OR ?A paper copy in the mail ?If you have any lab test that is abnormal or we need to change your treatment, we will call you to review the results. ? ? ?Testing/Procedures: ?None ? ? ?Follow-Up: ?At [facility], you and your health needs are our priority.  As part of our continuing mission to provide you with exceptional heart care, we have created designated Provider Care Teams.  These Care Teams include your primary Cardiologist (physician) and Advanced Practice Providers (APPs -  Physician Assistants and Nurse Practitioners) who all work together to provide you with the care you need, when you need it. ? ?We recommend signing up for the patient portal called "MyChart".  Sign up information is provided on this After Visit Summary.  MyChart is used to connect with patients for Virtual Visits (Telemedicine).  Patients are able to view lab/test results, encounter notes, upcoming appointments, etc.  Non-urgent messages can be sent to your provider as well.   ?To learn more about what you can do with MyChart, go to [email or web address].   ? ?Your next appointment:   ?6 month(s) ? ?The format for your next appointment:   ?In Person ? ?Provider:   ?Muhammad Arida, MD or Ryan Dunn, PA-C  ? ? ? ?Important Information About Sugar ? ? ? ? ?  ?

## 2022-01-28 ENCOUNTER — Encounter: Payer: Self-pay | Admitting: Cardiovascular Disease

## 2022-01-28 ENCOUNTER — Telehealth: Payer: Self-pay | Admitting: Physician Assistant

## 2022-01-28 NOTE — Telephone Encounter (Signed)
Patient assistance application completed for [facility], faxed, and filed in samples closet.

## 2022-02-04 ENCOUNTER — Encounter: Payer: Self-pay | Admitting: Cardiovascular Disease

## 2022-02-04 NOTE — Telephone Encounter (Signed)
Letter received from [employer] showing patient is eligible to receive Entresto at no cost to patient until 02/02/23. Letter filed in samples closet.

## 2022-04-05 ENCOUNTER — Ambulatory Visit: Payer: Medicare HMO | Attending: Family | Admitting: Family

## 2022-04-05 ENCOUNTER — Encounter: Payer: Self-pay | Admitting: Family

## 2022-04-05 VITALS — BP 133/45 | HR 71 | Resp 16 | Ht 72.0 in | Wt 226.2 lb

## 2022-04-05 DIAGNOSIS — I2510 Atherosclerotic heart disease of native coronary artery without angina pectoris: Secondary | ICD-10-CM | POA: Insufficient documentation

## 2022-04-05 DIAGNOSIS — M1990 Unspecified osteoarthritis, unspecified site: Secondary | ICD-10-CM | POA: Diagnosis not present

## 2022-04-05 DIAGNOSIS — I110 Hypertensive heart disease with heart failure: Secondary | ICD-10-CM | POA: Diagnosis present

## 2022-04-05 DIAGNOSIS — Z951 Presence of aortocoronary bypass graft: Secondary | ICD-10-CM | POA: Insufficient documentation

## 2022-04-05 DIAGNOSIS — I10 Essential (primary) hypertension: Secondary | ICD-10-CM

## 2022-04-05 DIAGNOSIS — I5032 Chronic diastolic (congestive) heart failure: Secondary | ICD-10-CM | POA: Diagnosis not present

## 2022-04-05 DIAGNOSIS — R531 Weakness: Secondary | ICD-10-CM | POA: Diagnosis not present

## 2022-04-05 DIAGNOSIS — Z7902 Long term (current) use of antithrombotics/antiplatelets: Secondary | ICD-10-CM | POA: Insufficient documentation

## 2022-04-05 DIAGNOSIS — I25810 Atherosclerosis of coronary artery bypass graft(s) without angina pectoris: Secondary | ICD-10-CM

## 2022-04-05 NOTE — Patient Instructions (Addendum)
Continue weighing daily and call for an overnight weight gain of 3 pounds or more or a weekly weight gain of more than 5 pounds. ? ? ?If you have voicemail, please make sure your mailbox is cleaned out so that we may leave a message and please make sure to listen to any voicemails.  ? ? ?Call us in the future if you need us for anything ?

## 2022-04-05 NOTE — Progress Notes (Signed)
Patient ID: Dale Cook., male    DOB: Dec 24, 1945, 76 y.o.   MRN: [id number]  HPI  Dale Cook is a 76 y/o male with a history of CAD, hyperlipidemia, HTN, arthritis and chronic heart failure.   Echo report from 10/15/20 reviewed and showed an EF of 60-65% along with normal PA Pressure, mild LAE and mild Dale.   LHC done 01/09/17 and showed: Prox Cx to Mid Cx lesion, 80 %stenosed. Ost 1st Mrg to 1st Mrg lesion, 60 %stenosed. Mid Cx lesion, 100 %stenosed. Ost LAD to Prox LAD lesion, 95 %stenosed. Mid LAD lesion, 60 %stenosed. Dist LAD-2 lesion, 60 %stenosed. Dist LAD-1 lesion, 90 %stenosed. Ost 2nd Mrg to 2nd Mrg lesion, 90 %stenosed. Prox RCA to Mid RCA lesion, 99 %stenosed. The left ventricular systolic function is normal. LV end diastolic pressure is normal. The left ventricular ejection fraction is 55-65% by visual estimate.   1. 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 2. Normal LV systolic function and high normal left ventricular end-diastolic pressure.  Was in the ED 11/03/21 due to acute on chronic heart failure. Gave a dose of IV lasix and he was released.   He presents today for a follow-up visit with a chief complaint of minimal shortness of breath with moderate exertion. Describes this as chronic in nature but occurs very infrequently. He tends to notice it if he's carrying something heavy for a little bit of a distance. He has associated fingertip numbness, left knee pain and visual difficulties along with this. Denies any difficulty sleeping, abdominal distention, palpitations, pedal edema, chest pain, wheezing, cough, dizziness, fatigue or weight gain.   May be having an upcoming eye surgery at the end of this year and possible left knee replacement next year.   Past Medical History:  Diagnosis Date   Arthritis    "hands, knees" (01/09/2017)   CAD (coronary artery disease)    a. 12/2016  Cath: LM nl, LAD 95p, 58m, 60/90d, LCX 80p/m, 155m, OM1 25m, OM2 90, RCA 99p, Ef 55-65%; b. 12/2016 CABG x 4 (LIMA->mLAD, VG->dLAD, VG->OM1->OM2); c. 11/2018 MV: EF 66%, no ischemia.   Cardiac murmur    Grade II/VI early peaking systolic in aortic area   CHF (congestive heart failure) (HCC)    Essential hypertension    GERD (gastroesophageal reflux disease)    High cholesterol    History of echocardiogram    a. 12/2016 Echo: EF 65-70%.   Left carotid bruit    Wears dentures    partial upper   Past Surgical History:  Procedure Laterality Date   CATARACT EXTRACTION W/PHACO Left 06/12/2020   Procedure: CATARACT EXTRACTION PHACO AND INTRAOCULAR LENS PLACEMENT (IOC) LEFT 4.36 00:51.9 8.4%;  Surgeon: Leandrew Koyanagi, MD;  Location: [place];  Service: Ophthalmology;  Laterality: Left;   COLONOSCOPY WITH PROPOFOL N/A 11/09/2016   Procedure: COLONOSCOPY WITH PROPOFOL;  Surgeon: Robert Bellow, MD;  Location: [facility];  Service: Endoscopy;  Laterality: N/A;   COLONOSCOPY WITH PROPOFOL N/A 11/29/2019   Procedure: COLONOSCOPY WITH PROPOFOL;  Surgeon: Robert Bellow, MD;  Location: [facility];  Service: Endoscopy;  Laterality: N/A;   CORONARY ARTERY BYPASS GRAFT N/A 01/12/2017   CABG x 4: LIMA to mLAD, SVG to dLAD, sequential SVG to OM1-OM2, EVH via left thigh and leg;  Surgeon: Rexene Alberts, MD;  Location: [place];  Service: Open Heart Surgery;  Laterality: N/A;   CORONARY STENT INTERVENTION N/A 08/11/2021  Procedure: CORONARY STENT INTERVENTION;  Surgeon: Wellington Hampshire, MD;  Location: [place] [facility];  Service: Cardiovascular;  Laterality: N/A;   INTRAOPERATIVE TRANSESOPHAGEAL ECHOCARDIOGRAM N/A 01/12/2017   Procedure: INTRAOPERATIVE TRANSESOPHAGEAL ECHOCARDIOGRAM;  Surgeon: Rexene Alberts, MD;  Location: [place];  Service: Open Heart Surgery;  Laterality: N/A;   INTRAVASCULAR ULTRASOUND/IVUS N/A 08/11/2021   Procedure: Intravascular Ultrasound/IVUS;  Surgeon:  Wellington Hampshire, MD;  Location: [place] [facility];  Service: Cardiovascular;  Laterality: N/A;   LEFT HEART CATH AND CORONARY ANGIOGRAPHY Left 01/09/2017   Procedure: Left Heart Cath and Coronary Angiography;  Surgeon: Wellington Hampshire, MD;  Location: [place] [facility];  Service: Cardiovascular;  Laterality: Left;   LEFT HEART CATH AND CORS/GRAFTS ANGIOGRAPHY N/A 07/26/2021   Procedure: LEFT HEART CATH AND CORS/GRAFTS ANGIOGRAPHY;  Surgeon: Wellington Hampshire, MD;  Location: [place] [facility];  Service: Cardiovascular;  Laterality: N/A;   TOTAL KNEE ARTHROPLASTY Right 01/18/2021   Procedure: TOTAL KNEE ARTHROPLASTY;  Surgeon: Lovell Sheehan, MD;  Location: [facility];  Service: Orthopedics;  Laterality: Right;   Family History  Problem Relation Age of Onset   Heart attack Mother    Colon cancer Brother    Social History   Tobacco Use   Smoking status: Never   Smokeless tobacco: Never  Substance Use Topics   Alcohol use: No   No Known Allergies  Prior to Admission medications   Medication Sig Start Date End Date Taking? Authorizing Provider  acetaminophen (TYLENOL) 325 MG tablet Take 2 tablets (650 mg total) by mouth every 4 (four) hours as needed for headache or mild pain. 08/12/21  Yes Barrett, Evelene Croon, PA-C  aspirin 81 MG chewable tablet Chew 81 mg by mouth daily.   Yes [provider]  atorvastatin (LIPITOR) 40 MG tablet Take 40 mg by mouth daily.   Yes [provider]  carvedilol (COREG) 25 MG tablet TAKE 1 TABLET TWICE DAILY 10/13/21  Yes Wellington Hampshire, MD  clopidogrel (PLAVIX) 75 MG tablet Take 1 tablet (75 mg total) by mouth daily. 07/26/21 07/26/22 Yes Wellington Hampshire, MD  furosemide (LASIX) 20 MG tablet Take 20 mg by mouth every other day.   Yes [provider]  isosorbide mononitrate (IMDUR) 30 MG 24 hr tablet Take 1 tablet (30 mg total) by mouth daily. 09/20/21  Yes Dunn, Areta Haber, PA-C  loratadine (CLARITIN) 10 MG tablet Take 10 mg by mouth  daily as needed for allergies.   Yes [provider]  pantoprazole (PROTONIX) 40 MG tablet Take 1 tablet (40 mg total) by mouth daily. 06/04/21  Yes Dunn, Ryan M, PA-C  sacubitril-valsartan (ENTRESTO) 24-26 MG Take 1 tablet by mouth 2 (two) times daily. 01/27/22  Yes Rise Mu, PA-C   Review of Systems  Constitutional:  Negative for appetite change and fatigue.  HENT:  Negative for congestion, postnasal drip and sore throat.   Eyes:  Positive for visual disturbance.  Respiratory:  Positive for shortness of breath (very little). Negative for cough and wheezing.   Cardiovascular:  Negative for chest pain, palpitations and leg swelling.  Gastrointestinal:  Negative for abdominal distention and abdominal pain.  Endocrine: Negative.   Genitourinary: Negative.   Musculoskeletal:  Positive for arthralgias (left knee). Negative for back pain.  Skin: Negative.   Allergic/Immunologic: Negative.   Neurological:  Positive for numbness (in right fingertips ever since bypass). Negative for dizziness and light-headedness.  Hematological:  Negative for adenopathy. Does not bruise/bleed easily.  Psychiatric/Behavioral:  Negative for dysphoric mood and sleep disturbance (sleeping on 1 pillow). The patient is not nervous/anxious.    Vitals:   04/05/22 1018  BP: (!) 133/45  Pulse: 71  Resp: 16  SpO2: 99%  Weight: 226 lb 4 oz (102.6 kg)  Height: 6' (1.829 m)   Wt Readings from Last 3 Encounters:  04/05/22 226 lb 4 oz (102.6 kg)  01/27/22 224 lb 6 oz (101.8 kg)  01/17/22 223 lb (101.2 kg)   Lab Results  Component Value Date   CREATININE 1.20 12/02/2021   CREATININE 1.27 (H) 11/04/2021   CREATININE 1.24 08/25/2021   Physical Exam Vitals and nursing note reviewed.  Constitutional:      Appearance: Normal appearance.  HENT:     Head: Normocephalic and atraumatic.  Cardiovascular:     Rate and Rhythm: Normal rate and regular rhythm.  Pulmonary:     Effort: Pulmonary effort is normal.  No respiratory distress.     Breath sounds: No wheezing or rales.  Abdominal:     General: There is no distension.     Palpations: Abdomen is soft.  Musculoskeletal:        General: No tenderness.     Cervical back: Normal range of motion and neck supple.     Right lower leg: No edema.     Left lower leg: No edema.  Skin:    General: Skin is warm and dry.  Neurological:     General: No focal deficit present.     Mental Status: He is alert and oriented to person, place, and time.  Psychiatric:        Mood and Affect: Mood normal.        Behavior: Behavior normal.        Thought Content: Thought content normal.    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 (mild LVH)- - NYHA class II - euvolemic today - weighing daily; reminded to call for an overnight weight gain of > 2 pounds or a weekly weight gain of > 5 pounds - weight up 3 pounds from last visit 3 months ago   - not adding salt and has been reading food labels for sodium content; reviewed the importance of closely following a 2000mg  sodium diet  - on GDMT of entresto - reminded to keep daily fluid intake to 60-64 ounces daily; he says that he tends to drink 64-80 ounces of fluids daily - BNP 11/04/21 was 180 - has received his flu, RSV and covid vaccines for this season  2: HTN- - BP looks good (133/45) - he reports home BP of 122-135/ 60-70's - saw PCP Hall Busing) August 2023 - BMP 12/02/21 reviewed and showed sodium 137, potassium 4.0, creatinine 1.2 and GFR 60  3: CAD- - had quadruple CABG years ago - on aspirin, clopidogrel and atorvastatin  - saw cardiology (Dunn) 01/27/22 - taking isosorbide twice weekly as it makes him feel weak when he takes it   Medication list reviewed.   Due to HF stability, will not make a return appointment for patient at this time. Advised to call us back for any questions or to make another appointment and he was comfortable with this  plan.

## 2022-04-07 ENCOUNTER — Other Ambulatory Visit: Payer: Self-pay | Admitting: Cardiovascular Disease

## 2022-04-11 ENCOUNTER — Other Ambulatory Visit: Payer: Self-pay | Admitting: Physician Assistant

## 2022-04-11 NOTE — Telephone Encounter (Signed)
Hi,  Could you please advise if ok to refill this prescription? Medication was last written by Thurmond Butts on 06-04-2021. Thank you so much.

## 2022-05-06 ENCOUNTER — Telehealth: Payer: Self-pay | Admitting: Cardiovascular Disease

## 2022-05-06 NOTE — Telephone Encounter (Signed)
   Pre-operative Risk Assessment    Patient Name: Mayjor Ager.  DOB: [birth year] MRN: [id number]      Request for Surgical Clearance    Procedure:   ectropion repair  Date of Surgery:  Clearance 05/27/22                                 Surgeon:  not indicated Surgeon's Group or Practice Name:  [facility] Phone number:  [phone] Fax number:  [phone]   Type of Clearance Requested:   - Pharmacy:  Hold Aspirin and Clopidogrel (Plavix) instructions    Type of Anesthesia:  Not Indicated   Additional requests/questions:    Courtney Heys   05/06/2022, 4:26 PM

## 2022-05-09 ENCOUNTER — Telehealth: Payer: Self-pay | Admitting: *Deleted

## 2022-05-09 NOTE — Telephone Encounter (Signed)
Pt is returning call.  

## 2022-05-09 NOTE — Telephone Encounter (Signed)
Pt called back and we went over medication review. Med rec has been done.      

## 2022-05-09 NOTE — Telephone Encounter (Signed)
Pt agreeable to plan of care for tele pre op appt 05/16/22 @ 3 pm. I was speaking with the pt to confirm his medications when the call was dropped. I did call the pt and left message that needed to confirm his medications, though this can be done during the tele appt if need be.   Consent has been given.

## 2022-05-09 NOTE — Telephone Encounter (Signed)
Pt agreeable to plan of care for tele pre op appt 05/16/22 @ 3 pm. I was speaking with the pt to confirm his medications when the call was dropped. I did call the pt and left message that needed to confirm his medications, though this can be done during the tele appt if need be.  Consent has been given.     Patient Consent for Virtual Visit        Dale Cook. has provided verbal consent on 05/09/2022 for a virtual visit (video or telephone).   CONSENT FOR VIRTUAL VISIT FOR:  Dale Cook.  By participating in this virtual visit I agree to the following:  I hereby voluntarily request, consent and authorize [facility] and its employed or contracted physicians, physician assistants, nurse practitioners or other licensed health care professionals (the Practitioner), to provide me with telemedicine health care services (the "Services") as deemed necessary by the treating Practitioner. I acknowledge and consent to receive the Services by the Practitioner via telemedicine. I understand that the telemedicine visit will involve communicating with the Practitioner through live audiovisual communication technology and the disclosure of certain medical information by electronic transmission. I acknowledge that I have been given the opportunity to request an in-person assessment or other available alternative prior to the telemedicine visit and am voluntarily participating in the telemedicine visit.  I understand that I have the right to withhold or withdraw my consent to the use of telemedicine in the course of my care at any time, without affecting my right to future care or treatment, and that the Practitioner or I may terminate the telemedicine visit at any time. I understand that I have the right to inspect all information obtained and/or recorded in the course of the telemedicine visit and may receive copies of available information for a reasonable fee.  I understand that  some of the potential risks of receiving the Services via telemedicine include:  Delay or interruption in medical evaluation due to technological equipment failure or disruption; Information transmitted may not be sufficient (e.g. poor resolution of images) to allow for appropriate medical decision making by the Practitioner; and/or  In rare instances, security protocols could fail, causing a breach of personal health information.  Furthermore, I acknowledge that it is my responsibility to provide information about my medical history, conditions and care that is complete and accurate to the best of my ability. I acknowledge that Practitioner's advice, recommendations, and/or decision may be based on factors not within their control, such as incomplete or inaccurate data provided by me or distortions of diagnostic images or specimens that may result from electronic transmissions. I understand that the practice of medicine is not an exact science and that Practitioner makes no warranties or guarantees regarding treatment outcomes. I acknowledge that a copy of this consent can be made available to me via my patient portal [facility] MyChart), or I can request a printed copy by calling the office of [facility].    I understand that my insurance will be billed for this visit.   I have read or had this consent read to me. I understand the contents of this consent, which adequately explains the benefits and risks of the Services being provided via telemedicine.  I have been provided ample opportunity to ask questions regarding this consent and the Services and have had my questions answered to my satisfaction. I give my informed consent for the services to be provided through the use  of telemedicine in my medical care

## 2022-05-09 NOTE — Telephone Encounter (Signed)
Pt is returning call. Transferred to Carol Fiato, CMA.  

## 2022-05-09 NOTE — Telephone Encounter (Signed)
Left message to call back to set up tele pre op appt.  

## 2022-05-09 NOTE — Telephone Encounter (Signed)
   Name: Dale Cook.  DOB: 01-06-1946  MRN: [id number]  Primary Cardiologist: [name], MD  Chart reviewed as part of pre-operative protocol coverage. Because of Dale Holtsclaw Premo Jr.'s past medical history and time since last visit, he will require a follow-up telephone visit in order to better assess preoperative cardiovascular risk.  Pre-op covering staff: - Please schedule appointment and call patient to inform them. If patient already had an upcoming appointment within acceptable timeframe, please add "pre-op clearance" to the appointment notes so provider is aware. - Please contact requesting surgeon's office via preferred method (i.e, phone, fax) to inform them of need for appointment prior to surgery.  Recent PCI to left circumflex 07/2021. Would prefer to continue ASA and hold plavix x 5 days prior to procedure. Please resume when medically safe to do so.   Sharlene Dory, PA-C  05/09/2022, 8:37 AM

## 2022-05-09 NOTE — Telephone Encounter (Signed)
Pt called back and we went over medication review. Med rec has been done.

## 2022-05-16 ENCOUNTER — Ambulatory Visit: Payer: Medicare HMO | Attending: Cardiology | Admitting: General Practice

## 2022-05-16 DIAGNOSIS — Z01810 Encounter for preprocedural cardiovascular examination: Secondary | ICD-10-CM

## 2022-05-16 NOTE — Progress Notes (Signed)
Virtual Visit via Telephone Note   Because of Dale Szalay Boyde Jr.'s co-morbid illnesses, he is at least at moderate risk for complications without adequate follow up.  This format is felt to be most appropriate for this patient at this time.  The patient did not have access to video technology/had technical difficulties with video requiring transitioning to audio format only (telephone).  All issues noted in this document were discussed and addressed.  No physical exam could be performed with this format.  Please refer to the patient's chart for his consent to telehealth for [facility].  Evaluation Performed:  Preoperative cardiovascular risk assessment _____________   Date:  05/16/2022   Patient ID:  Dale Cook., DOB [birth year], MRN [id number] Patient Location:  Home Provider location:   Office  Primary Care Provider:  Albina Billet, MD Primary Cardiologist:  Kathlyn Sacramento, MD  Chief Complaint / Patient Profile   76 y.o. y/o male with a h/o coronary artery disease, hyperlipidemia, HFpEF, HTN who is pending ectropion repair and presents today for telephonic preoperative cardiovascular risk assessment.  Past Medical History    Past Medical History:  Diagnosis Date   Arthritis    "hands, knees" (01/09/2017)   CAD (coronary artery disease)    a. 12/2016 Cath: LM nl, LAD 95p, 76m, 60/90d, LCX 80p/m, 161m, OM1 78m, OM2 90, RCA 99p, Ef 55-65%; b. 12/2016 CABG x 4 (LIMA->mLAD, VG->dLAD, VG->OM1->OM2); c. 11/2018 MV: EF 66%, no ischemia.   Cardiac murmur    Grade II/VI early peaking systolic in aortic area   CHF (congestive heart failure) (HCC)    Essential hypertension    GERD (gastroesophageal reflux disease)    High cholesterol    History of echocardiogram    a. 12/2016 Echo: EF 65-70%.   Left carotid bruit    Wears dentures    partial upper   Past Surgical History:  Procedure Laterality Date   CATARACT EXTRACTION W/PHACO Left 06/12/2020   Procedure:  CATARACT EXTRACTION PHACO AND INTRAOCULAR LENS PLACEMENT (IOC) LEFT 4.36 00:51.9 8.4%;  Surgeon: Leandrew Koyanagi, MD;  Location: [place];  Service: Ophthalmology;  Laterality: Left;   COLONOSCOPY WITH PROPOFOL N/A 11/09/2016   Procedure: COLONOSCOPY WITH PROPOFOL;  Surgeon: Robert Bellow, MD;  Location: [facility];  Service: Endoscopy;  Laterality: N/A;   COLONOSCOPY WITH PROPOFOL N/A 11/29/2019   Procedure: COLONOSCOPY WITH PROPOFOL;  Surgeon: Robert Bellow, MD;  Location: [facility];  Service: Endoscopy;  Laterality: N/A;   CORONARY ARTERY BYPASS GRAFT N/A 01/12/2017   CABG x 4: LIMA to mLAD, SVG to dLAD, sequential SVG to OM1-OM2, EVH via left thigh and leg;  Surgeon: Rexene Alberts, MD;  Location: [place];  Service: Open Heart Surgery;  Laterality: N/A;   CORONARY STENT INTERVENTION N/A 08/11/2021   Procedure: CORONARY STENT INTERVENTION;  Surgeon: Wellington Hampshire, MD;  Location: [place] [facility];  Service: Cardiovascular;  Laterality: N/A;   INTRAOPERATIVE TRANSESOPHAGEAL ECHOCARDIOGRAM N/A 01/12/2017   Procedure: INTRAOPERATIVE TRANSESOPHAGEAL ECHOCARDIOGRAM;  Surgeon: Rexene Alberts, MD;  Location: [place];  Service: Open Heart Surgery;  Laterality: N/A;   INTRAVASCULAR ULTRASOUND/IVUS N/A 08/11/2021   Procedure: Intravascular Ultrasound/IVUS;  Surgeon: Wellington Hampshire, MD;  Location: [place] [facility];  Service: Cardiovascular;  Laterality: N/A;   LEFT HEART CATH AND CORONARY ANGIOGRAPHY Left 01/09/2017   Procedure: Left Heart Cath and Coronary Angiography;  Surgeon: Wellington Hampshire, MD;  Location: [place] [facility];  Service: Cardiovascular;  Laterality: Left;   LEFT HEART CATH AND CORS/GRAFTS ANGIOGRAPHY N/A 07/26/2021   Procedure: LEFT HEART CATH AND CORS/GRAFTS ANGIOGRAPHY;  Surgeon: Iran Ouch, MD;  Location: [facility];  Service: Cardiovascular;  Laterality: N/A;   TOTAL KNEE ARTHROPLASTY Right 01/18/2021   Procedure: TOTAL  KNEE ARTHROPLASTY;  Surgeon: Lyndle Herrlich, MD;  Location: [facility];  Service: Orthopedics;  Laterality: Right;    Allergies  No Known Allergies  History of Present Illness    Dale Cook. is a 76 y.o. male who presents via audio/video conferencing for a telehealth visit today.  Pt was last seen in cardiology clinic on 01/27/2022 by Eula Listen, PA-C.  At that time Wei Poplaski. was doing well .  The patient is now pending procedure as outlined above. Since his last visit, he remains stable from a cardiac standpoint.  Today he denies chest pain, shortness of breath, lower extremity edema, fatigue, palpitations, melena, hematuria, hemoptysis, diaphoresis, weakness, presyncope, syncope, orthopnea, and PND.   Home Medications    Prior to Admission medications   Medication Sig Start Date End Date Taking? Authorizing Provider  acetaminophen (TYLENOL) 325 MG tablet Take 2 tablets (650 mg total) by mouth every 4 (four) hours as needed for headache or mild pain. 08/12/21   Barrett, Joline Salt, PA-C  aspirin 81 MG chewable tablet Chew 81 mg by mouth daily.    [provider]  atorvastatin (LIPITOR) 40 MG tablet Take 40 mg by mouth daily.    [provider]  carvedilol (COREG) 25 MG tablet TAKE 1 TABLET TWICE DAILY 10/13/21   Iran Ouch, MD  clopidogrel (PLAVIX) 75 MG tablet Take 1 tablet (75 mg total) by mouth daily. 07/26/21 07/26/22  Iran Ouch, MD  furosemide (LASIX) 20 MG tablet Take 20 mg by mouth every other day.    [provider]  isosorbide mononitrate (IMDUR) 30 MG 24 hr tablet Take 1 tablet (30 mg total) by mouth daily. 09/20/21   Dunn, Raymon Mutton, PA-C  loratadine (CLARITIN) 10 MG tablet Take 10 mg by mouth daily as needed for allergies.    [provider]  pantoprazole (PROTONIX) 40 MG tablet TAKE 1 TABLET EVERY DAY 04/11/22   Dunn, Raymon Mutton, PA-C  sacubitril-valsartan (ENTRESTO) 24-26 MG Take 1 tablet by mouth 2 (two) times  daily. 01/27/22   Sondra Barges, PA-C    Physical Exam    Vital Signs:  Loreli Dollar. does not have vital signs available for review today.  Given telephonic nature of communication, physical exam is limited. AAOx3. NAD. Normal affect.  Speech and respirations are unlabored.  Accessory Clinical Findings    None  Assessment & Plan    1.  Preoperative Cardiovascular Risk Assessment: Ectropion repair, [facility]      Primary Cardiologist: [name], MD  Chart reviewed as part of pre-operative protocol coverage. Given past medical history and time since last visit, based on ACC/AHA guidelines, Bertie Simien. would be at acceptable risk for the planned procedure without further cardiovascular testing.   Recent PCI to left circumflex 07/2021. Would prefer to continue ASA and hold plavix x 5 days prior to procedure. Please resume when medically safe to do so.    Patient was advised that if he develops new symptoms prior to surgery to contact our office to arrange a follow-up appointment.  He verbalized understanding.  I will route this recommendation to the requesting party via  Epic fax function and remove from pre-op pool.    Time:   Today, I have spent 6 minutes with the patient with telehealth technology discussing medical history, symptoms, and management plan.  Prior to his phone evaluation I spent greater than 10 minutes reviewing his past medical history and cardiac medications.   Deberah Pelton, NP  05/16/2022, 8:39 AM

## 2022-06-01 ENCOUNTER — Other Ambulatory Visit: Payer: Self-pay | Admitting: Cardiovascular Disease

## 2022-06-01 HISTORY — PX: ECTROPION REPAIR: SHX357

## 2022-06-01 HISTORY — PX: EYE SURGERY: SHX253

## 2022-06-08 ENCOUNTER — Other Ambulatory Visit: Payer: Self-pay

## 2022-06-08 ENCOUNTER — Emergency Department: Payer: Medicare HMO

## 2022-06-08 ENCOUNTER — Encounter: Payer: Self-pay | Admitting: Emergency Medicine

## 2022-06-08 ENCOUNTER — Emergency Department
Admission: EM | Admit: 2022-06-08 | Discharge: 2022-06-08 | Disposition: A | Discharge status: Home / Self Care | Payer: Medicare HMO | Attending: Emergency Medicine | Admitting: Emergency Medicine

## 2022-06-08 DIAGNOSIS — I5032 Chronic diastolic (congestive) heart failure: Secondary | ICD-10-CM | POA: Insufficient documentation

## 2022-06-08 DIAGNOSIS — I2510 Atherosclerotic heart disease of native coronary artery without angina pectoris: Secondary | ICD-10-CM | POA: Insufficient documentation

## 2022-06-08 DIAGNOSIS — Y92410 Unspecified street and highway as the place of occurrence of the external cause: Secondary | ICD-10-CM | POA: Insufficient documentation

## 2022-06-08 DIAGNOSIS — R0789 Other chest pain: Secondary | ICD-10-CM | POA: Insufficient documentation

## 2022-06-08 DIAGNOSIS — I110 Hypertensive heart disease with heart failure: Secondary | ICD-10-CM | POA: Insufficient documentation

## 2022-06-08 NOTE — Discharge Instructions (Addendum)
-  You may take Tylenol/ibuprofen as needed for pain.  -Return to the emergency department anytime if you begin to experience any new or worsening symptoms.
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
## 2022-07-05 ENCOUNTER — Other Ambulatory Visit: Payer: Self-pay | Admitting: Orthopedic Surgery

## 2022-07-05 ENCOUNTER — Encounter: Payer: Self-pay | Admitting: Orthopedic Surgery

## 2022-07-05 DIAGNOSIS — Z01818 Encounter for other preprocedural examination: Secondary | ICD-10-CM

## 2022-07-05 NOTE — H&P (Deleted)
  The note originally documented on this encounter has been moved the the encounter in which it belongs.  
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
## 2022-07-06 ENCOUNTER — Other Ambulatory Visit: Payer: Self-pay

## 2022-07-06 ENCOUNTER — Telehealth: Payer: Self-pay | Admitting: *Deleted

## 2022-07-06 ENCOUNTER — Encounter
Admission: RE | Admit: 2022-07-06 | Discharge: 2022-07-06 | Disposition: A | Discharge status: Home / Self Care | Payer: Medicare PPO | Source: Ambulatory Visit | Attending: Orthopedic Surgery | Admitting: Orthopedic Surgery

## 2022-07-06 VITALS — Ht 72.0 in | Wt 219.9 lb

## 2022-07-06 DIAGNOSIS — Z951 Presence of aortocoronary bypass graft: Secondary | ICD-10-CM

## 2022-07-06 DIAGNOSIS — I10 Essential (primary) hypertension: Secondary | ICD-10-CM

## 2022-07-06 DIAGNOSIS — Z01812 Encounter for preprocedural laboratory examination: Secondary | ICD-10-CM

## 2022-07-06 HISTORY — DX: Angina pectoris, unspecified: I20.9

## 2022-07-06 NOTE — Telephone Encounter (Signed)
Request for pre-operative cardiac clearance:    1. What type of surgery is being performed?  LEFT TOTAL KNEE ARTHROPLASTY   2. When is this surgery scheduled?  07/19/2022    3. Type of clearance being requested (medical, pharmacy, both)?  BOTH    4. Are there any medications that need to be held prior to surgery?  ASA + CLOPIDOGREL   5. Practice name and name of physician performing surgery?  Performing surgeon: Dr. Kurtis Bushman, MD  Requesting clearance: Honor Loh, FNP-C     6. Anesthesia type (none, local, MAC, general)?  NEURAXIAL   7. What is the office phone and fax number?   Fax: [phone]

## 2022-07-06 NOTE — Telephone Encounter (Signed)
  Patient Consent for Virtual Visit         Dale Cook. has provided verbal consent on 07/06/2022 for a virtual visit (video or telephone).   CONSENT FOR VIRTUAL VISIT FOR:  Dale Cook.  By participating in this virtual visit I agree to the following:  I hereby voluntarily request, consent and authorize [facility] and its employed or contracted physicians, physician assistants, nurse practitioners or other licensed health care professionals (the Practitioner), to provide me with telemedicine health care services (the "Services") as deemed necessary by the treating Practitioner. I acknowledge and consent to receive the Services by the Practitioner via telemedicine. I understand that the telemedicine visit will involve communicating with the Practitioner through live audiovisual communication technology and the disclosure of certain medical information by electronic transmission. I acknowledge that I have been given the opportunity to request an in-person assessment or other available alternative prior to the telemedicine visit and am voluntarily participating in the telemedicine visit.  I understand that I have the right to withhold or withdraw my consent to the use of telemedicine in the course of my care at any time, without affecting my right to future care or treatment, and that the Practitioner or I may terminate the telemedicine visit at any time. I understand that I have the right to inspect all information obtained and/or recorded in the course of the telemedicine visit and may receive copies of available information for a reasonable fee.  I understand that some of the potential risks of receiving the Services via telemedicine include:  Delay or interruption in medical evaluation due to technological equipment failure or disruption; Information transmitted may not be sufficient (e.g. poor resolution of images) to allow for appropriate medical decision making  by the Practitioner; and/or  In rare instances, security protocols could fail, causing a breach of personal health information.  Furthermore, I acknowledge that it is my responsibility to provide information about my medical history, conditions and care that is complete and accurate to the best of my ability. I acknowledge that Practitioner's advice, recommendations, and/or decision may be based on factors not within their control, such as incomplete or inaccurate data provided by me or distortions of diagnostic images or specimens that may result from electronic transmissions. I understand that the practice of medicine is not an exact science and that Practitioner makes no warranties or guarantees regarding treatment outcomes. I acknowledge that a copy of this consent can be made available to me via my patient portal ([facility]), or I can request a printed copy by calling the office of [facility].    I understand that my insurance will be billed for this visit.   I have read or had this consent read to me. I understand the contents of this consent, which adequately explains the benefits and risks of the Services being provided via telemedicine.  I have been provided ample opportunity to ask questions regarding this consent and the Services and have had my questions answered to my satisfaction. I give my informed consent for the services to be provided through the use of telemedicine in my medical care

## 2022-07-06 NOTE — Telephone Encounter (Signed)
   Name: Dale Cook.  DOB: January 18, 1946  MRN: [id number]  Primary Cardiologist: Kathlyn Sacramento, MD  Chart reviewed as part of pre-operative protocol coverage. Because of [name]'s past medical history and time since last visit, he will require a follow-up telephone visit in order to better assess preoperative cardiovascular risk.  Pre-op covering staff: - Please schedule appointment and call patient to inform them. If patient already had an upcoming appointment within acceptable timeframe, please add "pre-op clearance" to the appointment notes so provider is aware. - Please contact requesting surgeon's office via preferred method (i.e, phone, fax) to inform them of need for appointment prior to surgery.  Recent PCI to left circumflex 07/2021. Would prefer to continue ASA and hold plavix x 5 days prior to procedure. Please resume when medically safe to do so.     Elgie Collard, PA-C  07/06/2022, 11:42 AM

## 2022-07-06 NOTE — Patient Instructions (Addendum)
Your procedure is scheduled on: Tuesday July 19, 2022. Report to Day Surgery inside [facility] 2nd floor, stop by registration desk before getting on elevator.  To find out your arrival time please call [phone] between 1PM - 3PM on Monday July 18, 2022.  Remember: Instructions that are not followed completely may result in serious medical risk,  up to and including death, or upon the discretion of your surgeon and anesthesiologist your  surgery may need to be rescheduled.     _X__ 1. Do not eat food or drink fluids after midnight the night before your procedure.                 No chewing gum or hard candies.   __X__2.  On the morning of surgery brush your teeth with toothpaste and water, you                may rinse your mouth with mouthwash if you wish.  Do not swallow any toothpaste or mouthwash.     _X__ 3.  No Alcohol for 24 hours before or after surgery.   _X__ 4.  Do Not Smoke or use e-cigarettes For 24 Hours Prior to Your Surgery.                 Do not use any chewable tobacco products for at least 6 hours prior to                 Surgery.  _X__  5.  Do not use any recreational drugs (marijuana, cocaine, heroin, ecstasy, MDMA or other)                For at least one week prior to your surgery.  Combination of these drugs with anesthesia                May have life threatening results.  ____  6.  Bring all medications with you on the day of surgery if instructed.   __X__  7.  Notify your doctor if there is any change in your medical condition      (cold, fever, infections).     Do not wear jewelry, make-up, hairpins, clips or nail polish. Do not wear lotions, powders, or perfumes. You may wear deodorant. Do not shave 48 hours prior to surgery. Men may shave face and neck. Do not bring valuables to the hospital.    [facility] is not responsible for any belongings or valuables.  Contacts, dentures or bridgework may not be worn into  surgery. Leave your suitcase in the car. After surgery it may be brought to your room. For patients admitted to the hospital, discharge time is determined by your treatment team.   Patients discharged the day of surgery will not be allowed to drive home.   Make arrangements for someone to be with you for the first 24 hours of your Same Day Discharge.   __X__ Take these medicines the morning of surgery with A SIP OF WATER:    1. carvedilol (COREG) 25 MG   2. isosorbide mononitrate (IMDUR) 30 MG   3. pantoprazole (PROTONIX) 40 MG   4.  5.  6.  ____ Fleet Enema (as directed)   __X__ Use CHG Soap (or wipes) as directed  ____ Use Benzoyl Peroxide Gel as instructed  ____ Use inhalers on the day of surgery  ____ Stop metformin 2 days prior to surgery    ____ Take 1/2 of usual insulin dose the  night before surgery. No insulin the morning          of surgery.   __X__ Stop clopidogrel (PLAVIX) 75 MG 5 days and  aspirin 81 MG  before your procedure as instructed by your doctor.   __X__ One Week prior to surgery- Stop Anti-inflammatories such as Ibuprofen, Aleve, Advil, Motrin, meloxicam (MOBIC), diclofenac, etodolac, ketorolac, Toradol, Daypro, piroxicam, Goody's or BC powders. OK TO USE TYLENOL IF NEEDED   __X__ One week prior to surgery stop ALL vitamins and or supplements until after surgery.    ____ Bring C-Pap to the hospital.    If you have any questions regarding your pre-procedure instructions,  Please call Pre-admit Testing at [phone]    Preparing for Surgery with CHLORHEXIDINE GLUCONATE (CHG) Soap  Chlorhexidine Gluconate (CHG) Soap  o An antiseptic cleaner that kills germs and bonds with the skin to continue killing germs even after washing  o Used for showering the night before surgery and morning of surgery  Before surgery, you can play an important role by reducing the number of germs on your skin.  CHG (Chlorhexidine gluconate) soap is an antiseptic  cleanser which kills germs and bonds with the skin to continue killing germs even after washing.  Please do not use if you have an allergy to CHG or antibacterial soaps. If your skin becomes reddened/irritated stop using the CHG.  1. Shower the NIGHT BEFORE SURGERY and the MORNING OF SURGERY with CHG soap.  2. If you choose to wash your hair, wash your hair first as usual with your normal shampoo.  3. After shampooing, rinse your hair and body thoroughly to remove the shampoo.  4. Use CHG as you would any other liquid soap. You can apply CHG directly to the skin and wash gently with a scrungie or a clean washcloth.  5. Apply the CHG soap to your body only from the neck down. Do not use on open wounds or open sores. Avoid contact with your eyes, ears, mouth, and genitals (private parts). Wash face and genitals (private parts) with your normal soap.  6. Wash thoroughly, paying special attention to the area where your surgery will be performed.  7. Thoroughly rinse your body with warm water.  8. Do not shower/wash with your normal soap after using and rinsing off the CHG soap.  9. Pat yourself dry with a clean towel.  10. Wear clean pajamas to bed the night before surgery.  12. Place clean sheets on your bed the night of your first shower and do not sleep with pets.  13. Shower again with the CHG soap on the day of surgery prior to arriving at the hospital.  14. Do not apply any deodorants/lotions/powders.  15. Please wear clean clothes to the hospital.

## 2022-07-07 ENCOUNTER — Encounter
Admission: RE | Admit: 2022-07-07 | Discharge: 2022-07-07 | Disposition: A | Discharge status: Home / Self Care | Payer: Medicare PPO | Source: Ambulatory Visit | Attending: Orthopedic Surgery | Admitting: Orthopedic Surgery

## 2022-07-07 DIAGNOSIS — I10 Essential (primary) hypertension: Secondary | ICD-10-CM | POA: Insufficient documentation

## 2022-07-07 DIAGNOSIS — Z01818 Encounter for other preprocedural examination: Secondary | ICD-10-CM | POA: Insufficient documentation

## 2022-07-07 DIAGNOSIS — Z01812 Encounter for preprocedural laboratory examination: Secondary | ICD-10-CM

## 2022-07-07 DIAGNOSIS — Z951 Presence of aortocoronary bypass graft: Secondary | ICD-10-CM | POA: Diagnosis not present

## 2022-07-07 HISTORY — DX: Unspecified osteoarthritis, unspecified site: M19.90

## 2022-07-07 HISTORY — DX: Unspecified diastolic (congestive) heart failure: I50.30

## 2022-07-07 HISTORY — DX: Atherosclerosis of aorta: I70.0

## 2022-07-07 LAB — URINALYSIS, ROUTINE W REFLEX MICROSCOPIC
Bilirubin Urine: NEGATIVE
Glucose, UA: NEGATIVE mg/dL
Hgb urine dipstick: NEGATIVE
Ketones, ur: NEGATIVE mg/dL
Leukocytes,Ua: NEGATIVE
Nitrite: NEGATIVE
Protein, ur: NEGATIVE mg/dL
Specific Gravity, Urine: 1.01 (ref 1.005–1.030)
pH: 5 (ref 5.0–8.0)

## 2022-07-07 LAB — CBC
HCT: 45.5 % (ref 39.0–52.0)
Hemoglobin: 14.6 g/dL (ref 13.0–17.0)
MCH: 26.7 pg (ref 26.0–34.0)
MCHC: 32.1 g/dL (ref 30.0–36.0)
MCV: 83.2 fL (ref 80.0–100.0)
Platelets: 194 10*3/uL (ref 150–400)
RBC: 5.47 MIL/uL (ref 4.22–5.81)
RDW: 15 % (ref 11.5–15.5)
WBC: 6.3 10*3/uL (ref 4.0–10.5)
nRBC: 0 % (ref 0.0–0.2)

## 2022-07-07 LAB — BASIC METABOLIC PANEL
Anion gap: 7 (ref 5–15)
BUN: 18 mg/dL (ref 8–23)
CO2: 25 mmol/L (ref 22–32)
Calcium: 8.7 mg/dL — ABNORMAL LOW (ref 8.9–10.3)
Chloride: 107 mmol/L (ref 98–111)
Creatinine, Ser: 1.28 mg/dL — ABNORMAL HIGH (ref 0.61–1.24)
GFR, Estimated: 59 mL/min — ABNORMAL LOW (ref >60)
Glucose, Bld: 91 mg/dL (ref 70–99)
Potassium: 3.8 mmol/L (ref 3.5–5.1)
Sodium: 139 mmol/L (ref 135–145)

## 2022-07-07 LAB — TYPE AND SCREEN
ABO/RH(D): A POS
Antibody Screen: NEGATIVE

## 2022-07-07 LAB — SURGICAL PCR SCREEN
MRSA, PCR: NEGATIVE
Staphylococcus aureus: POSITIVE — AB

## 2022-07-07 NOTE — Progress Notes (Incomplete)
Perioperative Services  Pre-Admission/Anesthesia Testing Clinical Review  Date: 07/07/22  Patient Demographics:  Name: Dale Cook. DOB:   [birth year] MRN:   [id number]  Planned Surgical Procedure(s):    Case: [id number] Date/Time: 07/19/22 7408   Procedure: TOTAL KNEE ARTHROPLASTY (Left: Knee)   Anesthesia type: Spinal   Pre-op diagnosis: M17.12 Unilateral primary osteoarthritis, left knee   Location: [facility] OR ROOM 02 / [place] [facility]   Surgeons: Lovell Sheehan, MD   NOTE: Available PAT nursing documentation and vital signs have been reviewed. Clinical nursing staff has updated patient's PMH/PSHx, current medication list, and drug allergies/intolerances to ensure comprehensive history available to assist in medical decision making as it pertains to the aforementioned surgical procedure and anticipated anesthetic course. Extensive review of available clinical information performed. [place] [facility] and PSHx updated with any diagnoses/procedures that  may have been inadvertently omitted during his intake with the pre-admission testing department's nursing staff.  Clinical Discussion:  Dale Cook. is a 77 y.o. male who is submitted for pre-surgical anesthesia review and clearance prior to him undergoing the above procedure. Patient has never been a smoker. Pertinent PMH includes: CAD (s/p CABG), HFpEF, aortic atherosclerosis, cardiac murmur, angina, LEFT carotid bruit, HTN, HLD, GERD (on daily PPI), OA.   Patient is followed by cardiology Fletcher Anon, MD). He was last seen in the cardiology clinic on 01/27/2022; notes reviewed.  At the time of his clinic visit, patient doing well overall from a cardiovascular perspective.  He denied any episodes of chest pain, PND, orthopnea, palpitations, vertiginous symptoms, or presyncope/syncope.  Patient with complaints of increased shortness of breath and lower extremity edema.  He is followed in the heart failure clinic and  notes improvement with prescribed interventions.  PMH significant for cardiovascular diagnoses.  Patient underwent diagnostic left heart catheterization on 01/09/2017 that revealed significant three-vessel CAD.  Left ventricular systolic function normal. High normal LVEDP.  Revascularization options felt to be limited given diffuse disease.  Decision was made to transfer patient to [facility] for consult with CVTS for consideration of CABG procedure.  TTE performed on 01/11/2017 revealed a normal left ventricular systolic function with an EF of 65-70%.  There was no significant valvular insufficiency or stenosis noted.  Patient underwent a four-vessel CABG on 01/12/2017.  LIMA-mLAD, SVG-dLAD, SVG-OM1, and SVG-OM2 bypass grafts were placed.  Last TTE performed on 10/15/2020 demonstrated normal left ventricular systolic function with an EF of 60-65%.  Doppler parameters consistent with grade 1 diastolic dysfunction.  There was mild left atrial dilatation.  Mild mitral and aortic valve regurgitation noted.  All transvalvular gradients were noted to be normal providing no evidence suggestive of stenosis.  Most recent myocardial perfusion imaging study was performed on 07/13/2021 revealing a normal left regular systolic function with an EF of 60 to 65%.  There was a small in size, moderate to severe, partially reversible defect involving the apical inferior and mid inferolateral segments.  Findings most consistent with scar and peri-infarct ischemia, however an element of artifact cannot be excluded.  CT attenuation correction images showed post CABG findings, coronary artery calcification, and aortic atherosclerosis.  Compared to the previous study performed on 12/07/2018, the mid/apical inferior/inferolateral defect is new.  Study determined to be abnormal, however low risk overall.  Following CABG and post revascularization stent placement, patient remains on daily DAPT therapy using ASA +  clopidogrel.  Patient is compliant with therapy with no evidence or reports of GI bleeding.  Blood  pressure well-controlled at 138/72 mmHg on beta-blocker (carvedilol), diuretic (furosemide), nitrate (isosorbide mononitrate).  Patient is on an ARB/ANRi Delene Loll) for his CHF diagnosis.  Patient takes atorvastatin for his HLD diagnosis and further ASCVD prevention.  He is not diabetic. Patient does not have an OSAH diagnosis. Functional capacity, as defined by DASI, is documented as being >/= 4 METS.  No changes were made to his medication regimen.  Patient to follow-up with outpatient cardiology in 6 months or sooner if needed.  Patient is scheduled for an elective TOTAL KNEE ARTHROPLASTY (Left: Knee) on 07/19/2022 with Dr. Kurtis Bushman, MD. Given patient's past medical history significant for cardiovascular diagnoses, presurgical cardiac clearance was sought by the performing surgeon's office and PAT team. Per cardiology, ***.  As previously mentioned, this patient is on daily DAPT therapy.  He has been instructed on recommendations from his cardiologist for holding his clopidogrel dose for 5 days prior to his procedure with plans to restart as soon as postoperative bleeding risk felt to be minimized by his primary attending surgeon.  Patient is aware that his last dose of clopidogrel should be on 07/13/2022.  The patient will continue his daily low-dose ASA throughout his perioperative course.  Patient denies previous perioperative complications with anesthesia in the past. In review of the available records, it is noted that patient underwent a MAC anesthetic course at [facility] (ASA III) in 05/2022 without documented complications.      07/06/2022    9:40 AM 06/08/2022    5:08 PM 06/08/2022    5:07 PM  Vitals with BMI  Height _0  _1    Weight 219 lbs 14 oz 226 lbs   BMI 73.53 29.92   Systolic   426  Diastolic   81  Pulse   67     Providers/Specialists:   NOTE: Primary physician provider listed below. Patient may have been seen by APP or partner within same practice.   PROVIDER ROLE / SPECIALTY LAST Jayme Cloud, MD Orthopedics (Surgeon) 07/05/2022  Albina Billet, MD Primary Care Provider ???  Marlyne Beards, MD Cardiology ***   Allergies:  Patient has no known allergies.  Current Home Medications:    acetaminophen (TYLENOL) 325 MG tablet   aspirin 81 MG chewable tablet   atorvastatin (LIPITOR) 40 MG tablet   carvedilol (COREG) 25 MG tablet   clopidogrel (PLAVIX) 75 MG tablet   furosemide (LASIX) 20 MG tablet   isosorbide mononitrate (IMDUR) 30 MG 24 hr tablet   loratadine (CLARITIN) 10 MG tablet   Multiple Vitamin (MULTIVITAMIN) capsule   neomycin-polymyxin b-dexamethasone (MAXITROL) 3.5-10000-0.1 SUSP   pantoprazole (PROTONIX) 40 MG tablet   sacubitril-valsartan (ENTRESTO) 24-26 MG   No current facility-administered medications for this encounter.   History:   Past Medical History:  Diagnosis Date   (HFpEF) heart failure with preserved ejection fraction ([place])    a.) TTE 01/11/2017: EF 65-70%; b.) TTE 10/15/2020: EF 60-65%. mild LAE, mild MR/AR, G1DD   Anginal pain (HCC)    Aortic atherosclerosis (HCC)    CAD (coronary artery disease)    a.) LHC 01/09/17: LAD 95p, 22m 60/90d, LCx 80p/m, 1038mOM1 6039mM2 90, RCA 99p; b.) 12/2016 CABG x 4 (LIMA-LAD, VG-dLAD, VG-OM1-OM2); c.) 11/2018 MV: EF 66%, no isch; d.) LHC/PCI 07/26/21: 100 LPAV, 90 p-mLCx (2.5 x 28 mm Synergy XD DES), 85 p-mLAD, 70 OM1, 80 m-dLAD, 50 dLAD; LIMA-LAD pat, SVG-dLAD and SVG-OM1/2 occ; e.) MV 07/13/2021: EF 60-65%,  sm mod-sev part rev apical inf and mid inflat defect   Cardiac murmur    a.) grade II/VI early peaking systolic in aortic area   Essential hypertension    GERD (gastroesophageal reflux disease)    High cholesterol    Left carotid bruit    Osteoarthritis    Wears dentures    partial upper   Past Surgical  History:  Procedure Laterality Date   CATARACT EXTRACTION W/PHACO Left 06/12/2020   Procedure: CATARACT EXTRACTION PHACO AND INTRAOCULAR LENS PLACEMENT (IOC) LEFT 4.36 00:51.9 8.4%;  Surgeon: Leandrew Koyanagi, MD;  Location: [place];  Service: Ophthalmology;  Laterality: Left;   COLONOSCOPY WITH PROPOFOL N/A 11/09/2016   Procedure: COLONOSCOPY WITH PROPOFOL;  Surgeon: Robert Bellow, MD;  Location: [facility];  Service: Endoscopy;  Laterality: N/A;   COLONOSCOPY WITH PROPOFOL N/A 11/29/2019   Procedure: COLONOSCOPY WITH PROPOFOL;  Surgeon: Robert Bellow, MD;  Location: [facility] END[facility]COPY;  Service: Endoscopy;  Laterality: N/A;   CORONARY ARTERY BYPASS GRAFT N/A 01/12/2017   Procedure: CORONARY ARTERY BYPASS GRAFTING x 4 (LIMA to mLAD, SVG to dLAD, sequential SVG to OM1-OM2, EVH via left thigh and leg); Surgeon: Rexene Alberts, MD;  Location: [place];  Service: Open Heart Surgery;  Laterality: N/A;   CORONARY STENT INTERVENTION N/A 08/11/2021   Procedure: CORONARY STENT INTERVENTION;  Surgeon: Wellington Hampshire, MD;  Location: [place] CV [facility];  Service: Cardiovascular;  Laterality: N/A;   ECTROPION REPAIR Bilateral 06/01/2022   INTRAOPERATIVE TRANSESOPHAGEAL ECHOCARDIOGRAM N/A 01/12/2017   Procedure: INTRAOPERATIVE TRANSESOPHAGEAL ECHOCARDIOGRAM;  Surgeon: Rexene Alberts, MD;  Location: [place];  Service: Open Heart Surgery;  Laterality: N/A;   INTRAVASCULAR ULTRASOUND/IVUS N/A 08/11/2021   Procedure: Intravascular Ultrasound/IVUS;  Surgeon: Wellington Hampshire, MD;  Location: [place] [facility];  Service: Cardiovascular;  Laterality: N/A;   LEFT HEART CATH AND CORONARY ANGIOGRAPHY Left 01/09/2017   Procedure: Left Heart Cath and Coronary Angiography;  Surgeon: Wellington Hampshire, MD;  Location: [place] [facility];  Service: Cardiovascular;  Laterality: Left;   LEFT HEART CATH AND CORS/GRAFTS ANGIOGRAPHY N/A 07/26/2021   Procedure: LEFT HEART CATH AND CORS/GRAFTS  ANGIOGRAPHY;  Surgeon: Wellington Hampshire, MD;  Location: [place] [facility];  Service: Cardiovascular;  Laterality: N/A;   TOTAL KNEE ARTHROPLASTY Right 01/18/2021   Procedure: TOTAL KNEE ARTHROPLASTY;  Surgeon: Lovell Sheehan, MD;  Location: [facility] ORS;  Service: Orthopedics;  Laterality: Right;   Family History  Problem Relation Age of Onset   Heart attack Mother    Colon cancer Brother    Social History   Tobacco Use   Smoking status: Never   Smokeless tobacco: Never  Vaping Use   Vaping Use: Never used  Substance Use Topics   Alcohol use: No   Drug use: No    Pertinent Clinical Results:  LABS: Labs reviewed: Acceptable for surgery.  No visits with results within 3 Day(s) from this visit.  Latest known visit with results is:  Hospital Outpatient Visit on 12/02/2021  Component Date Value Ref Range Status   Sodium 12/02/2021 137  135 - 145 mmol/L Final   Potassium 12/02/2021 4.0  3.5 - 5.1 mmol/L Final   Chloride 12/02/2021 106  98 - 111 mmol/L Final   CO2 12/02/2021 26  22 - 32 mmol/L Final   Glucose, Bld 12/02/2021 123 (H)  70 - 99 mg/dL Final   Glucose reference range applies only to samples taken after fasting for at least 8 hours.  BUN 12/02/2021 13  8 - 23 mg/dL Final   Creatinine, Ser 12/02/2021 1.20  0.61 - 1.24 mg/dL Final   Calcium 12/02/2021 8.7 (L)  8.9 - 10.3 mg/dL Final   GFR, Estimated 12/02/2021 >60  >60 mL/min Final   Comment: (NOTE) Calculated using the CKD-EPI Creatinine Equation (2021)    Anion gap 12/02/2021 5  5 - 15 Final   Performed at [facility], [address] [place], [place] [address]    ECG: *** Date: 10/29/2020 Time ECG obtained: 1612 PM Rate: 65 bpm Rhythm:  Sinus rhythm with occasional PVCs Intervals: PR 186 ms. QRS 86 ms. QTc 420 ms. ST segment and T wave changes: No evidence of acute ST segment elevation or depression; evidence of an age undetermined inferior infarct Comparison: Similar to previous tracing obtained  on 09/19/2020   IMAGING / PROCEDURES: MYOCARDIAL PERFUSION IMAGING STUDY (LEXISCAN) performed on 07/13/2021 Normal left ventricular systolic function with an EF of 60-65%. Small in size, moderate to severe, partially reversible defect involving the apical inferior and mid inferolateral segments most consistent with scar and peri-infarct ischemia, though an element of artifact could not be excluded. CT attenuation correction imaging revealed post CABG findings, coronary artery calcification, and aortic atherosclerosis. Compared to the prior study performed on 12/07/2018, mid/apical, inferior/inferolateral defect is new Abnormal, probably low risk pharmacological myocardial perfusion stress test  CT ANGIO CHEST PE W AND/OR WO CONTRAST performed on 05/03/2021 No evidence of acute pulmonary embolus, pneumonia or other acute cardiopulmonary process. Borderline cardiomegaly. Native coronary artery disease with surgical changes of prior CABG. Mild paraseptal pulmonary emphysema. Mild dependent atelectasis. Aortic atherosclerosis Emphysema  TRANSTHORACIC ECHOCARDIOGRAM performed on 10/15/2020 Left ventricular ejection fraction, by estimation, is 60 to 65%. The left ventricle has normal function. The left ventricle has no regional wall motion abnormalities. Left ventricular diastolic parameters are consistent with Grade I diastolic dysfunction (impaired relaxation).  Right ventricular systolic function is normal. The right ventricular size is normal. There is normal pulmonary artery systolic pressure. The estimated right ventricular systolic pressure is 41.6 mmHg.  Left atrial size was mildly dilated. The mitral valve is normal in structure. Mild mitral valve regurgitation.  The aortic valve is normal in structure. Aortic valve regurgitation is mild.   CORONARY ARTERY BYPASS GRAFTING performed on 01/12/2017 Four-vessel CABG procedure LIMA-mLAD SVG-dLAD SVG-OM1 SVG-OM 2  LEFT HEART  CATHETERIZATION AND CORONARY ANGIOGRAPHY performed on 01/09/2017 Significant three-vessel  80% stenosis of the proximal to mid LCx 60% stenosis of the ostial first marginal to first marginal 100% stenosis of the mid LCx 95% stenosis of the ostial to proximal LAD 60% stenosis of the mid LAD 60% stenosis of the distal LAD-2 90% stenosis of the distal LAD-1 90% stenosis of the ostial second marginal to second marginal 99% stenosis of the proximal to mid RCA Normal left ventricular systolic function High normal LVEDP    Impression and Plan:  Dale Cook. has been referred for pre-anesthesia review and clearance prior to him undergoing the planned anesthetic and procedural courses. Available labs, pertinent testing, and imaging results were personally reviewed by me. This patient has been appropriately cleared by cardiology with an overall *** risk of significant perioperative cardiovascular complications.  Based on clinical review performed today (07/07/22), barring any significant acute changes in the patient's overall condition, it is anticipated that he will be able to proceed with the planned surgical intervention. Any acute changes in clinical condition may necessitate his procedure being postponed and/or cancelled. Patient will  meet with anesthesia team (MD and/or CRNA) on the day of his procedure for preoperative evaluation/assessment. Questions regarding anesthetic course will be fielded at that time.   Pre-surgical instructions were reviewed with the patient during his PAT appointment and questions were fielded by PAT clinical staff. Patient was advised that if any questions or concerns arise prior to his procedure then he should return a call to PAT and/or his surgeon's office to discuss.  Honor Loh, MSN, APRN, FNP-C, CEN [facility]  Peri-operative Services Nurse Practitioner Phone: [phone] Fax: [phone] 07/07/22 12:09 PM  NOTE: This  note has been prepared using Dragon dictation software. Despite my best ability to proofread, there is always the potential that unintentional transcriptional errors may still occur from this process.

## 2022-07-11 ENCOUNTER — Other Ambulatory Visit: Payer: Self-pay | Admitting: Cardiovascular Disease

## 2022-07-11 NOTE — Progress Notes (Unsigned)
Virtual Visit via Telephone Note   Because of [name]'s co-morbid illnesses, he is at least at moderate risk for complications without adequate follow up.  This format is felt to be most appropriate for this patient at this time.  The patient did not have access to video technology/had technical difficulties with video requiring transitioning to audio format only (telephone).  All issues noted in this document were discussed and addressed.  No physical exam could be performed with this format.  Please refer to the patient's chart for his consent to telehealth for [facility].  Evaluation Performed:  Preoperative cardiovascular risk assessment _____________   Date:  07/11/2022   Patient ID:  Dale Cook., DOB 1946/04/10, MRN [id number] Patient Location:  Home Provider location:   Office  Primary Care Provider:  [name], MD Primary Cardiologist:  [name], MD  Chief Complaint / Patient Profile   77 y.o. y/o male with a h/o h/o CAD s/p CABG x 4,(LIMA->mLAD, VG->dLAD, VG->OM1->OM2)  hyperlipidemia, HFpEF, GERD, HLD, HTN  who is pending left total knee arthroplasty and presents today for telephonic preoperative cardiovascular risk assessment.  History of Present Illness    Dale Cook. is a 77 y.o. male who presents via audio/video conferencing for a telehealth visit today.  Pt was last seen in cardiology clinic on 01/27/2022 by Eula Listen, PA.  At that time Zakry Caso. was doing well from a cardiac perspective.  He was about 12 pounds and his drink over 2 L of fluid daily..  The patient is now pending procedure as outlined above. Since his last visit, he doing well with no new cardiac complaints.  He is active and walks daily and lives in a house with stairs and climbs 17 frequently with no discomfort or shortness of breath.  He is able to lie flat at night with 1 pillow with no breathing issues. He denies chest pain, shortness  of breath, lower extremity edema, fatigue, palpitations, melena, hematuria, hemoptysis, diaphoresis, weakness, presyncope, syncope, orthopnea, and PND.    Past Medical History    Past Medical History:  Diagnosis Date   (HFpEF) heart failure with preserved ejection fraction (HCC)    a.) TTE 01/11/2017: EF 65-70%; b.) TTE 10/15/2020: EF 60-65%. mild LAE, mild MR/AR, G1DD   Anginal pain (HCC)    Aortic atherosclerosis (HCC)    CAD (coronary artery disease)    a.) LHC 01/09/17: LAD 95p, 27m, 60/90d, LCx 80p/m, 179m, OM1 70m, OM2 90, RCA 99p; b.) 12/2016 CABG x 4 (LIMA-LAD, VG-dLAD, VG-OM1-OM2); c.) 11/2018 MV: EF 66%, no isch; d.) LHC/PCI 07/26/21: 100 LPAV, 90 p-mLCx (2.5 x 28 mm Synergy XD DES), 85 p-mLAD, 70 OM1, 80 m-dLAD, 50 dLAD; LIMA-LAD pat, SVG-dLAD and SVG-OM1/2 occ; e.) MV 07/13/2021: EF 60-65%, sm mod-sev part rev apical inf and mid inflat defect   Cardiac murmur    a.) grade II/VI early peaking systolic in aortic area   Essential hypertension    GERD (gastroesophageal reflux disease)    High cholesterol    Left carotid bruit    Osteoarthritis    Wears dentures    partial upper   Past Surgical History:  Procedure Laterality Date   CATARACT EXTRACTION W/PHACO Left 06/12/2020   Procedure: CATARACT EXTRACTION PHACO AND INTRAOCULAR LENS PLACEMENT (IOC) LEFT 4.36 00:51.9 8.4%;  Surgeon: Lockie Mola, MD;  Location: [facility];  Service: Ophthalmology;  Laterality: Left;   COLONOSCOPY WITH PROPOFOL N/A 11/09/2016  Procedure: COLONOSCOPY WITH PROPOFOL;  Surgeon: Robert Bellow, MD;  Location: [facility];  Service: Endoscopy;  Laterality: N/A;   COLONOSCOPY WITH PROPOFOL N/A 11/29/2019   Procedure: COLONOSCOPY WITH PROPOFOL;  Surgeon: Robert Bellow, MD;  Location: [facility];  Service: Endoscopy;  Laterality: N/A;   CORONARY ARTERY BYPASS GRAFT N/A 01/12/2017   Procedure: CORONARY ARTERY BYPASS GRAFTING x 4 (LIMA to mLAD, SVG to dLAD, sequential SVG to  OM1-OM2, EVH via left thigh and leg); Surgeon: Rexene Alberts, MD;  Location: [place];  Service: Open Heart Surgery;  Laterality: N/A;   CORONARY STENT INTERVENTION N/A 08/11/2021   Procedure: CORONARY STENT INTERVENTION;  Surgeon: Wellington Hampshire, MD;  Location: [place] [facility];  Service: Cardiovascular;  Laterality: N/A;   ECTROPION REPAIR Bilateral 06/01/2022   INTRAOPERATIVE TRANSESOPHAGEAL ECHOCARDIOGRAM N/A 01/12/2017   Procedure: INTRAOPERATIVE TRANSESOPHAGEAL ECHOCARDIOGRAM;  Surgeon: Rexene Alberts, MD;  Location: [place];  Service: Open Heart Surgery;  Laterality: N/A;   INTRAVASCULAR ULTRASOUND/IVUS N/A 08/11/2021   Procedure: Intravascular Ultrasound/IVUS;  Surgeon: Wellington Hampshire, MD;  Location: [place] [facility];  Service: Cardiovascular;  Laterality: N/A;   LEFT HEART CATH AND CORONARY ANGIOGRAPHY Left 01/09/2017   Procedure: Left Heart Cath and Coronary Angiography;  Surgeon: Wellington Hampshire, MD;  Location: [place] [facility];  Service: Cardiovascular;  Laterality: Left;   LEFT HEART CATH AND CORS/GRAFTS ANGIOGRAPHY N/A 07/26/2021   Procedure: LEFT HEART CATH AND CORS/GRAFTS ANGIOGRAPHY;  Surgeon: Wellington Hampshire, MD;  Location: [place] [facility];  Service: Cardiovascular;  Laterality: N/A;   TOTAL KNEE ARTHROPLASTY Right 01/18/2021   Procedure: TOTAL KNEE ARTHROPLASTY;  Surgeon: Lovell Sheehan, MD;  Location: [facility];  Service: Orthopedics;  Laterality: Right;    Allergies  No Known Allergies  Home Medications    Prior to Admission medications   Medication Sig Start Date End Date Taking? Authorizing Provider  acetaminophen (TYLENOL) 325 MG tablet Take 2 tablets (650 mg total) by mouth every 4 (four) hours as needed for headache or mild pain. 08/12/21   Barrett, Evelene Croon, PA-C  aspirin 81 MG chewable tablet Chew 81 mg by mouth daily.    [provider]  atorvastatin (LIPITOR) 40 MG tablet Take 40 mg by mouth daily.    [provider]   carvedilol (COREG) 25 MG tablet TAKE 1 TABLET TWICE DAILY 06/01/22   Rise Mu, PA-C  clopidogrel (PLAVIX) 75 MG tablet TAKE 1 TABLET BY MOUTH EVERY DAY 07/11/22   Wellington Hampshire, MD  furosemide (LASIX) 20 MG tablet Take 20 mg by mouth every other day.    [provider]  isosorbide mononitrate (IMDUR) 30 MG 24 hr tablet Take 1 tablet (30 mg total) by mouth daily. 09/20/21   Dunn, Areta Haber, PA-C  loratadine (CLARITIN) 10 MG tablet Take 10 mg by mouth daily as needed for allergies.    [provider]  Multiple Vitamin (MULTIVITAMIN) capsule Take 1 capsule by mouth daily.    [provider]  neomycin-polymyxin b-dexamethasone (MAXITROL) 3.5-10000-0.1 SUSP Place 1 drop into both eyes as needed (dry eye). 06/01/22   [provider]  pantoprazole (PROTONIX) 40 MG tablet TAKE 1 TABLET EVERY DAY 04/11/22   Dunn, Areta Haber, PA-C  sacubitril-valsartan (ENTRESTO) 24-26 MG Take 1 tablet by mouth 2 (two) times daily. 01/27/22   Rise Mu, PA-C    Physical Exam    Vital Signs:  [name] does not have vital  signs available for review today.  Given telephonic nature of communication, physical exam is limited. AAOx3. NAD. Normal affect.  Speech and respirations are unlabored.  Accessory Clinical Findings    None  Assessment & Plan    1.  Preoperative Cardiovascular Risk Assessment:  The patient affirms he has been doing well without any new cardiac symptoms. They are able to achieve 4 METS without cardiac limitations. Therefore, based on ACC/AHA guidelines, the patient would be at acceptable risk for the planned procedure without further cardiovascular testing. The patient was advised that if he develops new symptoms prior to surgery to contact our office to arrange for a follow-up visit, and he verbalized understanding.   Mr. Rentz's perioperative risk of a major cardiac event is 11% according to the Revised Cardiac Risk Index (RCRI).  Therefore, he is  at high risk for perioperative complications.   His functional capacity is fair at 4.31 METs according to the [employer] Activity Status Index (DASI). Recommendations: According to ACC/AHA guidelines, no further cardiovascular testing needed.  The patient may proceed to surgery at acceptable risk.   Antiplatelet and/or Anticoagulation Recommendations: Plavix can be held for 5 days prior to his surgery.  Please continue aspirin through perioperative period as noted below.  The patient was advised that if he develops new symptoms prior to surgery to contact our office to arrange for a follow-up visit, and he verbalized understanding.  Recent PCI to left circumflex 07/2021. Would prefer to continue ASA and hold plavix x 5 days prior to procedure. Please resume when medically safe to do so.      A copy of this note will be routed to requesting surgeon.  Time:   Today, I have spent 6 minutes with the patient with telehealth technology discussing medical history, symptoms, and management plan.     Mable Fill, Marissa Nestle, NP  07/11/2022, 3:57 PM

## 2022-07-12 ENCOUNTER — Ambulatory Visit: Payer: Medicare PPO | Attending: Cardiology | Admitting: Nurse Practitioner

## 2022-07-12 DIAGNOSIS — Z01810 Encounter for preprocedural cardiovascular examination: Secondary | ICD-10-CM

## 2022-07-12 NOTE — Progress Notes (Signed)
Perioperative Services  Pre-Admission/Anesthesia Testing Clinical Review  Date: 07/12/22  Patient Demographics:  Name: Dale Cook. 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 Clinical nursing staff has updated patient's PMH/PSHx, current medication list, and drug allergies/intolerances to ensure comprehensive history available to assist in medical decision making as it pertains to the aforementioned surgical procedure and anticipated anesthetic course. Extensive review of available clinical information performed.  PMH and PSHx updated with any diagnoses/procedures that  may have been inadvertently omitted during his intake with the pre-admission testing department's nursing staff.  Clinical Discussion:  Dale Cook. is a 77 y.o. male who is submitted for pre-surgical anesthesia review and clearance prior to him undergoing the above procedure. Patient has never been a smoker. Pertinent PMH includes: CAD (s/p CABG), HFpEF, aortic atherosclerosis, cardiac murmur, angina, LEFT carotid bruit, HTN, HLD, GERD (on daily PPI), OA.   Patient is followed by cardiology Dale Anon, MD). He was last seen in the cardiology clinic on 01/27/2022; notes reviewed.  At the time of his clinic visit, patient doing well overall from a cardiovascular perspective.  He denied any episodes of chest pain, PND, orthopnea, palpitations, vertiginous symptoms, or presyncope/syncope.  Patient with complaints of increased shortness of breath and lower extremity edema.  He is followed in the heart failure clinic and  notes improvement with prescribed interventions.  PMH significant for cardiovascular diagnoses.  Patient underwent diagnostic left heart catheterization on 01/09/2017 that revealed significant three-vessel CAD.  Left ventricular systolic function normal. High normal LVEDP.  Revascularization options felt to be limited given diffuse disease.  Decision was made to transfer patient to [facility] for consult with CVTS for consideration of CABG procedure.  TTE performed on 01/11/2017 revealed a normal left ventricular systolic function with an EF of 65-70%.  There was no significant valvular insufficiency or stenosis noted.  Patient underwent a four-vessel CABG on 01/12/2017.  LIMA-mLAD, SVG-dLAD, SVG-OM1, and SVG-OM2 bypass grafts were placed.  Last TTE performed on 10/15/2020 demonstrated normal left ventricular systolic function with an EF of 60-65%.  Doppler parameters consistent with grade 1 diastolic dysfunction.  There was mild left atrial dilatation.  Mild mitral and aortic valve regurgitation noted.  All transvalvular gradients were noted to be normal providing no evidence suggestive of stenosis.  Most recent myocardial perfusion imaging study was performed on 07/13/2021 revealing a normal left regular systolic function with an EF of 60 to 65%.  There was a small in size, moderate to severe, partially reversible defect involving the apical inferior and mid inferolateral segments.  Findings most consistent with scar and peri-infarct ischemia, however an element of artifact cannot be excluded.  CT attenuation correction images showed post CABG findings, coronary artery calcification, and aortic atherosclerosis.  Compared to the previous study performed on 12/07/2018, the mid/apical inferior/inferolateral defect is new.  Study determined to be abnormal, however low risk overall.  Following CABG and post revascularization stent placement, patient remains on daily DAPT therapy using ASA +  clopidogrel.  Patient is compliant with therapy with no evidence or reports of GI bleeding.  Blood  pressure well-controlled at 138/72 mmHg on beta-blocker (carvedilol), diuretic (furosemide), nitrate (isosorbide mononitrate).  Patient is on an ARB/ANRi Dale Cook) for his CHF diagnosis.  Patient takes atorvastatin for his HLD diagnosis and further ASCVD prevention.  He is not diabetic. Patient does not have an OSAH diagnosis. Functional capacity, as defined by DASI, is documented as being >/= 4 METS.  No changes were made to his medication regimen.  Patient to follow-up with outpatient cardiology in 6 months or sooner if needed.  Patient is scheduled for an elective TOTAL KNEE ARTHROPLASTY (Left: Knee) on 07/19/2022 with Dr. Kurtis Bushman, MD. Given patient's past medical history significant for cardiovascular diagnoses, presurgical cardiac clearance was sought by the performing surgeon's office and PAT team. Per cardiology, "the patient affirms he has been doing well without any new cardiac symptoms. They are able to achieve 4 METS without cardiac limitations. Dale Cook's perioperative risk of a major cardiac event is 11% according to the RCRI. Therefore, he is at high risk for perioperative complications.His functional capacity is fair at 4.31 METs according to the DASI. Therefore, based on ACC/AHA guidelines, the patient would be at acceptable risk for the planned procedure without further cardiovascular testing".  As previously mentioned, this patient is on daily DAPT therapy.  He has been instructed on recommendations from his cardiologist for holding his clopidogrel dose for 5 days prior to his procedure with plans to restart as soon as postoperative bleeding risk felt to be minimized by his primary attending surgeon.  Patient is aware that his last dose of clopidogrel should be on 07/13/2022.  The patient will continue his daily low-dose ASA throughout his perioperative course.  Patient denies previous  perioperative complications with anesthesia in the past. In review of the available records, it is noted that patient underwent a MAC anesthetic course at [facility] (ASA III) in 05/2022 without documented complications.      07/06/2022    9:40 AM 06/08/2022    5:08 PM 06/08/2022    5:07 PM  Vitals with BMI  Height _0  _1    Weight 219 lbs 14 oz 226 lbs   BMI 29.52 84.13   Systolic   244  Diastolic   81  Pulse   67    Providers/Specialists:   NOTE: Primary physician provider listed below. Patient may have been seen by APP or partner within same practice.   PROVIDER ROLE / SPECIALTY LAST Jayme Cloud, MD Orthopedics (Surgeon) 07/05/2022  Albina Billet, MD Primary Care Provider ???  Marlyne Beards, MD Cardiology 01/27/2022   Allergies:  Patient has no known allergies.  Current Home Medications:    acetaminophen (TYLENOL) 325 MG tablet   aspirin 81 MG chewable tablet   atorvastatin (LIPITOR) 40 MG tablet   carvedilol (COREG) 25 MG tablet   clopidogrel (PLAVIX) 75 MG tablet   furosemide (LASIX) 20 MG tablet   isosorbide mononitrate (IMDUR) 30 MG 24 hr tablet   loratadine (CLARITIN) 10 MG tablet   Multiple Vitamin (MULTIVITAMIN) capsule   neomycin-polymyxin b-dexamethasone (MAXITROL) 3.5-10000-0.1 SUSP   pantoprazole (PROTONIX) 40 MG tablet   sacubitril-valsartan (ENTRESTO) 24-26 MG   No current facility-administered medications for this encounter.   History:   Past Medical History:  Diagnosis Date   (HFpEF) heart failure with preserved ejection fraction ([place])    a.) TTE 01/11/2017: EF 65-70%; b.) TTE 10/15/2020: EF 60-65%. mild LAE, mild MR/AR, G1DD   Anginal pain ([place])  Aortic atherosclerosis (HCC)    CAD (coronary artery disease)    a.) LHC 01/09/17: LAD 95p, 93m 60/90d, LCx 80p/m, 1093mOM1 604mM2 90, RCA 99p; b.) 12/2016 CABG x 4 (LIMA-LAD, VG-dLAD, VG-OM1-OM2); c.) 11/2018 MV: EF 66%, no isch; d.) LHC/PCI  07/26/21: 100 LPAV, 90 p-mLCx (2.5 x 28 mm Synergy XD DES), 85 p-mLAD, 70 OM1, 80 m-dLAD, 50 dLAD; LIMA-LAD pat, SVG-dLAD and SVG-OM1/2 occ; e.) MV 07/13/2021: EF 60-65%, sm mod-sev part rev apical inf and mid inflat defect   Cardiac murmur    a.) grade II/VI early peaking systolic in aortic area   Essential hypertension    GERD (gastroesophageal reflux disease)    High cholesterol    Left carotid bruit    Osteoarthritis    Wears dentures    partial upper   Past Surgical History:  Procedure Laterality Date   CATARACT EXTRACTION W/PHACO Left 06/12/2020   Procedure: CATARACT EXTRACTION PHACO AND INTRAOCULAR LENS PLACEMENT (IOC) LEFT 4.36 00:51.9 8.4%;  Surgeon: BraLeandrew KoyanagiD;  [facility] [facility] [place];  Laterality: Left;   COLONOSCOPY WITH PROPOFOL N/A 11/09/2016   Procedure: COLONOSCOPY WITH PROPOFOL;  Surgeon: ByrRobert BellowD;  Location: [facility] ENDOSCOPY;  Service: Endoscopy;  Laterality: N/A;   COLONOSCOPY WITH PROPOFOL N/A 11/29/2019   Procedure: COLONOSCOPY WITH PROPOFOL;  Surgeon: ByrRobert BellowD;  Location: [facility] ENDOSCOPY;  Service: Endoscopy;  Laterality: N/A;   CORONARY ARTERY BYPASS GRAFT N/A 01/12/2017   Procedure: CORONARY ARTERY BYPASS GRAFTING x 4 (LIMA to mLAD, SVG to dLAD, sequential SVG to OM1-OM2, EVH via left thigh and leg); Surgeon: OweRexene AlbertsD;  [facility];  Laterality: N/A;   CORONARY STENT INTERVENTION N/A 08/11/2021   Procedure: CORONARY STENT INTERVENTION;  Surgeon: AriWellington HampshireD;  [facility] [place] [facility];  Service: Cardiovascular;  Laterality: N/A;   ECTROPION REPAIR Bilateral 06/01/2022   INTRAOPERATIVE TRANSESOPHAGEAL ECHOCARDIOGRAM N/A 01/12/2017   Procedure: INTRAOPERATIVE TRANSESOPHAGEAL ECHOCARDIOGRAM;  Surgeon: OweRexene AlbertsD;  [facility] [place];  Laterality: N/A;   INTRAVASCULAR ULTRASOUND/IVUS N/A 08/11/2021    Procedure: Intravascular Ultrasound/IVUS;  Surgeon: AriWellington HampshireD;  [facility] [place] [facility];  Service: Cardiovascular;  Laterality: N/A;   LEFT HEART CATH AND CORONARY ANGIOGRAPHY Left 01/09/2017   Procedure: Left Heart Cath and Coronary Angiography;  Surgeon: AriWellington HampshireD;  [facility];  Service: Cardiovascular;  Laterality: Left;   LEFT HEART CATH AND CORS/GRAFTS ANGIOGRAPHY N/A 07/26/2021   Procedure: LEFT HEART CATH AND CORS/GRAFTS ANGIOGRAPHY;  Surgeon: AriWellington HampshireD;  [facility];  Service: Cardiovascular;  Laterality: N/A;   TOTAL KNEE ARTHROPLASTY Right 01/18/2021   Procedure: TOTAL KNEE ARTHROPLASTY;  Surgeon: BowLovell SheehanD;  Location: [facility] ORS;  Service: Orthopedics;  Laterality: Right;   Family History  Problem Relation Age of Onset   Heart attack Mother    Colon cancer Brother    Social History   Tobacco Use   Smoking status: Never   Smokeless tobacco: Never  Vaping Use   Vaping Use: Never used  Substance Use Topics   Alcohol use: No   Drug use: No    Pertinent Clinical Results:  LABS: Labs reviewed: Acceptable for surgery.  No visits with results within 3 Day(s) from this visit.  Latest known visit with results is:  Hospital Outpatient Visit on 07/07/2022  Component Date Value  Ref Range Status   ABO/RH(D) 07/07/2022 A POS   Final   Antibody Screen 07/07/2022 NEG   Final   Sample Expiration 07/07/2022 07/21/2022,2359   Final   Extend sample reason 07/07/2022    Final                   Value:NO TRANSFUSIONS OR PREGNANCY IN THE PAST 3 MONTHS Performed at [facility], [address] [place], [place] [address]    MRSA, PCR 07/07/2022 NEGATIVE  NEGATIVE Final   Staphylococcus aureus 07/07/2022 POSITIVE (A)  NEGATIVE Final   Comment: (NOTE) The Xpert SA Assay (FDA approved for NASAL specimens in patients 12 years of age and older), is one component of a comprehensive surveillance  program. It is not intended to diagnose infection nor to guide or monitor treatment. Performed at [facility], [address] [place], [place] [address]    WBC 07/07/2022 6.3  4.0 - 10.5 K/uL Final   RBC 07/07/2022 5.47  4.22 - 5.81 MIL/uL Final   Hemoglobin 07/07/2022 14.6  13.0 - 17.0 g/dL Final   HCT 07/07/2022 45.5  39.0 - 52.0 % Final   MCV 07/07/2022 83.2  80.0 - 100.0 fL Final   MCH 07/07/2022 26.7  26.0 - 34.0 pg Final   MCHC 07/07/2022 32.1  30.0 - 36.0 g/dL Final   RDW 07/07/2022 15.0  11.5 - 15.5 % Final   Platelets 07/07/2022 194  150 - 400 K/uL Final   nRBC 07/07/2022 0.0  0.0 - 0.2 % Final   Performed at [facility], [place], [place] [address]   Sodium 07/07/2022 139  135 - 145 mmol/L Final   Potassium 07/07/2022 3.8  3.5 - 5.1 mmol/L Final   Chloride 07/07/2022 107  98 - 111 mmol/L Final   CO2 07/07/2022 25  22 - 32 mmol/L Final   Glucose, Bld 07/07/2022 91  70 - 99 mg/dL Final   Glucose reference range applies only to samples taken after fasting for at least 8 hours.   BUN 07/07/2022 18  8 - 23 mg/dL Final   Creatinine, Ser 07/07/2022 1.28 (H)  0.61 - 1.24 mg/dL Final   Calcium 07/07/2022 8.7 (L)  8.9 - 10.3 mg/dL Final   GFR, Estimated 07/07/2022 59 (L)  >60 mL/min Final   Comment: (NOTE) Calculated using the CKD-EPI Creatinine Equation (2021)    Anion gap 07/07/2022 7  5 - 15 Final   Performed at [facility], [address] [place], [place] [address]   Color, Urine 07/07/2022 STRAW (A)  YELLOW Final   APPearance 07/07/2022 CLEAR (A)  CLEAR Final   Specific Gravity, Urine 07/07/2022 1.010  1.005 - 1.030 Final   pH 07/07/2022 5.0  5.0 - 8.0 Final   Glucose, UA 07/07/2022 NEGATIVE  NEGATIVE mg/dL Final   Hgb urine dipstick 07/07/2022 NEGATIVE  NEGATIVE Final   Bilirubin Urine 07/07/2022 NEGATIVE  NEGATIVE Final   Ketones, ur 07/07/2022 NEGATIVE  NEGATIVE mg/dL Final   Protein, ur 07/07/2022 NEGATIVE  NEGATIVE  mg/dL Final   Nitrite 07/07/2022 NEGATIVE  NEGATIVE Final   Leukocytes,Ua 07/07/2022 NEGATIVE  NEGATIVE Final   Performed at [facility], [address] [place], [place] [address]    ECG:  Date: 01/27/2022 Time ECG obtained: 1343 PM Rate: 73 bpm Rhythm: Sinus rhythm with occasional PACs Intervals: PR 160 ms. QRS 88 ms. QTc 409 ms. ST segment and T wave changes: No evidence of acute ST segment  elevation or depression; evidence of an age undetermined inferior infarct Comparison: Similar to previous tracing obtained on 11/25/2021   IMAGING / PROCEDURES: MYOCARDIAL PERFUSION IMAGING STUDY (LEXISCAN) performed on 07/13/2021 Normal left ventricular systolic function with an EF of 60-65%. Small in size, moderate to severe, partially reversible defect involving the apical inferior and mid inferolateral segments most consistent with scar and peri-infarct ischemia, though an element of artifact could not be excluded. CT attenuation correction imaging revealed post CABG findings, coronary artery calcification, and aortic atherosclerosis. Compared to the prior study performed on 12/07/2018, mid/apical, inferior/inferolateral defect is new Abnormal, probably low risk pharmacological myocardial perfusion stress test  CT ANGIO CHEST PE W AND/OR WO CONTRAST performed on 05/03/2021 No evidence of acute pulmonary embolus, pneumonia or other acute cardiopulmonary process. Borderline cardiomegaly. Native coronary artery disease with surgical changes of prior CABG. Mild paraseptal pulmonary emphysema. Mild dependent atelectasis. Aortic atherosclerosis Emphysema  TRANSTHORACIC ECHOCARDIOGRAM performed on 10/15/2020 Left ventricular ejection fraction, by estimation, is 60 to 65%. The left ventricle has normal function. The left ventricle has no regional wall motion abnormalities. Left ventricular diastolic parameters are consistent with Grade I diastolic dysfunction (impaired relaxation).   Right ventricular systolic function is normal. The right ventricular size is normal. There is normal pulmonary artery systolic pressure. The estimated right ventricular systolic pressure is 52.8 mmHg.  Left atrial size was mildly dilated. The mitral valve is normal in structure. Mild mitral valve regurgitation.  The aortic valve is normal in structure. Aortic valve regurgitation is mild.   CORONARY ARTERY BYPASS GRAFTING performed on 01/12/2017 Four-vessel CABG procedure LIMA-mLAD SVG-dLAD SVG-OM1 SVG-OM 2  LEFT HEART CATHETERIZATION AND CORONARY ANGIOGRAPHY performed on 01/09/2017 Significant three-vessel  80% stenosis of the proximal to mid LCx 60% stenosis of the ostial first marginal to first marginal 100% stenosis of the mid LCx 95% stenosis of the ostial to proximal LAD 60% stenosis of the mid LAD 60% stenosis of the distal LAD-2 90% stenosis of the distal LAD-1 90% stenosis of the ostial second marginal to second marginal 99% stenosis of the proximal to mid RCA Normal left ventricular systolic function High normal LVEDP    Impression and Plan:  Dale Cook. has been referred for pre-anesthesia review and clearance prior to him undergoing the planned anesthetic and procedural courses. Available labs, pertinent testing, and imaging results were personally reviewed by me. This patient has been appropriately cleared by cardiology with an overall ACCEPTABLE risk of significant perioperative cardiovascular complications.  Based on clinical review performed today (07/07/22), barring any significant acute changes in the patient's overall condition, it is anticipated that he will be able to proceed with the planned surgical intervention. Any acute changes in clinical condition may necessitate his procedure being postponed and/or cancelled. Patient will meet with anesthesia team (MD and/or CRNA) on the day of his procedure for preoperative evaluation/assessment. Questions  regarding anesthetic course will be fielded at that time.   Pre-surgical instructions were reviewed with the patient during his PAT appointment and questions were fielded by PAT clinical staff. Patient was advised that if any questions or concerns arise prior to his procedure then he should return a call to PAT and/or his surgeon's office to discuss.  Honor Loh, MSN, APRN, FNP-C, CEN [facility]  Peri-operative Services Nurse Practitioner Phone: [phone] Fax: [phone] 07/12/22 9:33 AM  NOTE: This note has been prepared using Dragon dictation software. Despite my best ability to proofread, there is always the potential that unintentional  transcriptional errors may still occur from this process.

## 2022-07-19 ENCOUNTER — Observation Stay
Admission: RE | Admit: 2022-07-19 | Discharge: 2022-07-20 | Disposition: A | Discharge status: Home Health | Payer: Medicare PPO | Attending: Orthopedic Surgery | Admitting: Orthopedic Surgery

## 2022-07-19 ENCOUNTER — Ambulatory Visit: Payer: Medicare PPO | Admitting: Urgent Care

## 2022-07-19 ENCOUNTER — Encounter: Payer: Self-pay | Admitting: Orthopedic Surgery

## 2022-07-19 ENCOUNTER — Other Ambulatory Visit: Payer: Self-pay

## 2022-07-19 ENCOUNTER — Encounter
Admission: RE | Disposition: A | Discharge status: Home Health | Payer: Self-pay | Source: Home / Self Care | Attending: Orthopedic Surgery

## 2022-07-19 DIAGNOSIS — M1712 Unilateral primary osteoarthritis, left knee: Secondary | ICD-10-CM | POA: Diagnosis not present

## 2022-07-19 DIAGNOSIS — Z96652 Presence of left artificial knee joint: Secondary | ICD-10-CM

## 2022-07-19 HISTORY — PX: TOTAL KNEE ARTHROPLASTY: SHX125

## 2022-07-19 SURGERY — ARTHROPLASTY, KNEE, TOTAL
Anesthesia: Spinal | Site: Knee | Laterality: Left

## 2022-07-19 MED ORDER — CARVEDILOL 12.5 MG PO TABS: 25.0000 mg | ORAL_TABLET | Freq: Two times a day (BID) | ORAL | Status: DC

## 2022-07-19 MED ORDER — FENTANYL CITRATE (PF) 100 MCG/2ML IJ SOLN
25.0000 ug | INTRAMUSCULAR | Status: DC | PRN
  Administered 2022-07-19 (×5): 25 ug via INTRAVENOUS

## 2022-07-19 MED ORDER — ACETAMINOPHEN 10 MG/ML IV SOLN
INTRAVENOUS | Status: AC
  Filled 2022-07-19: qty 100

## 2022-07-19 MED ORDER — MORPHINE SULFATE (PF) 4 MG/ML IV SOLN: 0.5000 mg | INTRAVENOUS | Status: DC | PRN

## 2022-07-19 MED ORDER — CHLORHEXIDINE GLUCONATE 0.12 % MT SOLN
OROMUCOSAL | Status: AC
  Administered 2022-07-19: 15 mL via OROMUCOSAL
  Filled 2022-07-19: qty 15

## 2022-07-19 MED ORDER — ISOSORBIDE MONONITRATE ER 30 MG PO TB24: 30.0000 mg | ORAL_TABLET | Freq: Every day | ORAL | Status: DC

## 2022-07-19 MED ORDER — ONDANSETRON HCL 4 MG PO TABS: 4.0000 mg | ORAL_TABLET | Freq: Four times a day (QID) | ORAL | Status: DC | PRN

## 2022-07-19 MED ORDER — SODIUM CHLORIDE 0.9 % IR SOLN
Status: DC | PRN
  Administered 2022-07-19: 1500 mL

## 2022-07-19 MED ORDER — CARVEDILOL 12.5 MG PO TABS
ORAL_TABLET | ORAL | Status: AC
  Filled 2022-07-19: qty 1

## 2022-07-19 MED ORDER — KETOROLAC TROMETHAMINE 15 MG/ML IJ SOLN
INTRAMUSCULAR | Status: AC
  Administered 2022-07-19: 7.5 mg via INTRAVENOUS
  Filled 2022-07-19: qty 1

## 2022-07-19 MED ORDER — PROPOFOL 500 MG/50ML IV EMUL
INTRAVENOUS | Status: DC | PRN
  Administered 2022-07-19: 30 mg via INTRAVENOUS
  Administered 2022-07-19: 75 ug/kg/min via INTRAVENOUS

## 2022-07-19 MED ORDER — ALUM & MAG HYDROXIDE-SIMETH 200-200-20 MG/5ML PO SUSP: 30.0000 mL | ORAL | Status: DC | PRN

## 2022-07-19 MED ORDER — LACTATED RINGERS IV SOLN: INTRAVENOUS | Status: DC

## 2022-07-19 MED ORDER — OXYCODONE HCL 5 MG PO TABS
5.0000 mg | ORAL_TABLET | ORAL | Status: DC | PRN
  Administered 2022-07-19: 5 mg via ORAL

## 2022-07-19 MED ORDER — SODIUM CHLORIDE (PF) 0.9 % IJ SOLN
INTRAMUSCULAR | Status: DC | PRN
  Administered 2022-07-19: 90 mL

## 2022-07-19 MED ORDER — METOPROLOL TARTRATE 5 MG/5ML IV SOLN
INTRAVENOUS | Status: AC
  Filled 2022-07-19: qty 5

## 2022-07-19 MED ORDER — BUPIVACAINE HCL (PF) 0.5 % IJ SOLN
INTRAMUSCULAR | Status: DC | PRN
  Administered 2022-07-19: 15 mg via INTRATHECAL

## 2022-07-19 MED ORDER — PHENOL 1.4 % MT LIQD: 1.0000 | OROMUCOSAL | Status: DC | PRN

## 2022-07-19 MED ORDER — SACUBITRIL-VALSARTAN 24-26 MG PO TABS
1.0000 | ORAL_TABLET | Freq: Two times a day (BID) | ORAL | Status: DC
  Administered 2022-07-19: 1 via ORAL
  Filled 2022-07-19 (×2): qty 1

## 2022-07-19 MED ORDER — HYDROCODONE-ACETAMINOPHEN 5-325 MG PO TABS
1.0000 | ORAL_TABLET | ORAL | Status: DC | PRN
  Administered 2022-07-19: 1 via ORAL

## 2022-07-19 MED ORDER — CLOPIDOGREL BISULFATE 75 MG PO TABS: 75.0000 mg | ORAL_TABLET | Freq: Every day | ORAL | Status: DC

## 2022-07-19 MED ORDER — 0.9 % SODIUM CHLORIDE (POUR BTL) OPTIME
TOPICAL | Status: DC | PRN
  Administered 2022-07-19: 500 mL

## 2022-07-19 MED ORDER — ONDANSETRON HCL 4 MG/2ML IJ SOLN
INTRAMUSCULAR | Status: AC
  Filled 2022-07-19: qty 2

## 2022-07-19 MED ORDER — LIDOCAINE HCL (CARDIAC) PF 100 MG/5ML IV SOSY
PREFILLED_SYRINGE | INTRAVENOUS | Status: DC | PRN
  Administered 2022-07-19: 60 mg via INTRAVENOUS

## 2022-07-19 MED ORDER — OXYCODONE HCL 5 MG PO TABS
ORAL_TABLET | ORAL | Status: AC
  Filled 2022-07-19: qty 1

## 2022-07-19 MED ORDER — ASPIRIN 81 MG PO CHEW: 81.0000 mg | CHEWABLE_TABLET | Freq: Every day | ORAL | Status: DC

## 2022-07-19 MED ORDER — PHENYLEPHRINE HCL-NACL 20-0.9 MG/250ML-% IV SOLN
INTRAVENOUS | Status: DC | PRN
  Administered 2022-07-19: 20 ug/min via INTRAVENOUS

## 2022-07-19 MED ORDER — ONDANSETRON HCL 4 MG/2ML IJ SOLN: 4.0000 mg | Freq: Once | INTRAMUSCULAR | Status: DC | PRN

## 2022-07-19 MED ORDER — POVIDONE-IODINE 10 % EX SWAB
2.0000 | Freq: Once | CUTANEOUS | Status: AC
  Administered 2022-07-19: 2 via TOPICAL

## 2022-07-19 MED ORDER — BISACODYL 10 MG RE SUPP: 10.0000 mg | Freq: Every day | RECTAL | Status: DC | PRN

## 2022-07-19 MED ORDER — TRANEXAMIC ACID-NACL 1000-0.7 MG/100ML-% IV SOLN
INTRAVENOUS | Status: AC
  Filled 2022-07-19: qty 100

## 2022-07-19 MED ORDER — HYDROCODONE-ACETAMINOPHEN 7.5-325 MG PO TABS
1.0000 | ORAL_TABLET | ORAL | Status: DC | PRN
  Administered 2022-07-20: 1 via ORAL

## 2022-07-19 MED ORDER — ACETAMINOPHEN 325 MG PO TABS: 325.0000 mg | ORAL_TABLET | Freq: Four times a day (QID) | ORAL | Status: DC | PRN

## 2022-07-19 MED ORDER — FENTANYL CITRATE (PF) 100 MCG/2ML IJ SOLN
INTRAMUSCULAR | Status: AC
  Filled 2022-07-19: qty 2

## 2022-07-19 MED ORDER — DEXAMETHASONE SODIUM PHOSPHATE 10 MG/ML IJ SOLN
INTRAMUSCULAR | Status: DC | PRN
  Administered 2022-07-19: 10 mg via INTRAVENOUS

## 2022-07-19 MED ORDER — FUROSEMIDE 20 MG PO TABS
ORAL_TABLET | ORAL | Status: AC
  Administered 2022-07-19: 20 mg via ORAL
  Filled 2022-07-19: qty 1

## 2022-07-19 MED ORDER — MAGNESIUM CITRATE PO SOLN: 1.0000 | Freq: Once | ORAL | Status: DC | PRN

## 2022-07-19 MED ORDER — KETOROLAC TROMETHAMINE 15 MG/ML IJ SOLN
7.5000 mg | Freq: Four times a day (QID) | INTRAMUSCULAR | Status: DC
  Administered 2022-07-20 (×2): 7.5 mg via INTRAVENOUS

## 2022-07-19 MED ORDER — FENTANYL CITRATE (PF) 100 MCG/2ML IJ SOLN
INTRAMUSCULAR | Status: DC | PRN
  Administered 2022-07-19 (×2): 25 ug via INTRAVENOUS

## 2022-07-19 MED ORDER — DIPHENHYDRAMINE HCL 12.5 MG/5ML PO ELIX: 12.5000 mg | ORAL_SOLUTION | ORAL | Status: DC | PRN

## 2022-07-19 MED ORDER — LIDOCAINE HCL (PF) 2 % IJ SOLN
INTRAMUSCULAR | Status: AC
  Filled 2022-07-19: qty 5

## 2022-07-19 MED ORDER — CEFAZOLIN SODIUM-DEXTROSE 2-4 GM/100ML-% IV SOLN
2.0000 g | INTRAVENOUS | Status: AC
  Administered 2022-07-19: 2 g via INTRAVENOUS

## 2022-07-19 MED ORDER — METOCLOPRAMIDE HCL 5 MG/ML IJ SOLN: 5.0000 mg | Freq: Three times a day (TID) | INTRAMUSCULAR | Status: DC | PRN

## 2022-07-19 MED ORDER — METOCLOPRAMIDE HCL 10 MG PO TABS: 5.0000 mg | ORAL_TABLET | Freq: Three times a day (TID) | ORAL | Status: DC | PRN

## 2022-07-19 MED ORDER — PANTOPRAZOLE SODIUM 40 MG PO TBEC: 40.0000 mg | DELAYED_RELEASE_TABLET | Freq: Every day | ORAL | Status: DC

## 2022-07-19 MED ORDER — DEXAMETHASONE SODIUM PHOSPHATE 10 MG/ML IJ SOLN
INTRAMUSCULAR | Status: AC
  Filled 2022-07-19: qty 1

## 2022-07-19 MED ORDER — FUROSEMIDE 20 MG PO TABS: 20.0000 mg | ORAL_TABLET | ORAL | Status: DC

## 2022-07-19 MED ORDER — ATORVASTATIN CALCIUM 20 MG PO TABS
ORAL_TABLET | ORAL | Status: AC
  Filled 2022-07-19: qty 2

## 2022-07-19 MED ORDER — ACETAMINOPHEN 10 MG/ML IV SOLN
INTRAVENOUS | Status: DC | PRN
  Administered 2022-07-19: 1000 mg via INTRAVENOUS

## 2022-07-19 MED ORDER — PROPOFOL 1000 MG/100ML IV EMUL
INTRAVENOUS | Status: AC
  Filled 2022-07-19: qty 100

## 2022-07-19 MED ORDER — HYDROCODONE-ACETAMINOPHEN 5-325 MG PO TABS
ORAL_TABLET | ORAL | Status: AC
  Filled 2022-07-19: qty 1

## 2022-07-19 MED ORDER — CEFAZOLIN SODIUM-DEXTROSE 2-4 GM/100ML-% IV SOLN
INTRAVENOUS | Status: AC
  Administered 2022-07-19: 2 g via INTRAVENOUS
  Filled 2022-07-19: qty 100

## 2022-07-19 MED ORDER — LABETALOL HCL 5 MG/ML IV SOLN: 5.0000 mg | INTRAVENOUS | Status: DC | PRN

## 2022-07-19 MED ORDER — ATORVASTATIN CALCIUM 20 MG PO TABS
40.0000 mg | ORAL_TABLET | Freq: Every day | ORAL | Status: DC
  Administered 2022-07-19 (×2): 40 mg via ORAL

## 2022-07-19 MED ORDER — IRRISEPT - 450ML BOTTLE WITH 0.05% CHG IN STERILE WATER, USP 99.95% OPTIME
TOPICAL | Status: DC | PRN
  Administered 2022-07-19: 450 mL

## 2022-07-19 MED ORDER — ONDANSETRON HCL 4 MG/2ML IJ SOLN: 4.0000 mg | Freq: Four times a day (QID) | INTRAMUSCULAR | Status: DC | PRN

## 2022-07-19 MED ORDER — CARVEDILOL 12.5 MG PO TABS
ORAL_TABLET | ORAL | Status: AC
  Administered 2022-07-19: 25 mg via ORAL
  Filled 2022-07-19: qty 1

## 2022-07-19 MED ORDER — FENTANYL CITRATE (PF) 100 MCG/2ML IJ SOLN
INTRAMUSCULAR | Status: AC
  Administered 2022-07-19: 25 ug via INTRAVENOUS
  Filled 2022-07-19: qty 2

## 2022-07-19 MED ORDER — CHLORHEXIDINE GLUCONATE 0.12 % MT SOLN: 15.0000 mL | Freq: Once | OROMUCOSAL | Status: AC

## 2022-07-19 MED ORDER — CEFAZOLIN SODIUM-DEXTROSE 2-4 GM/100ML-% IV SOLN
2.0000 g | Freq: Three times a day (TID) | INTRAVENOUS | Status: AC
  Administered 2022-07-20: 2 g via INTRAVENOUS

## 2022-07-19 MED ORDER — DOCUSATE SODIUM 100 MG PO CAPS
100.0000 mg | ORAL_CAPSULE | Freq: Two times a day (BID) | ORAL | Status: DC
  Administered 2022-07-19: 100 mg via ORAL

## 2022-07-19 MED ORDER — PHENYLEPHRINE HCL-NACL 20-0.9 MG/250ML-% IV SOLN
INTRAVENOUS | Status: AC
  Filled 2022-07-19: qty 250

## 2022-07-19 MED ORDER — ONDANSETRON HCL 4 MG/2ML IJ SOLN
INTRAMUSCULAR | Status: DC | PRN
  Administered 2022-07-19: 4 mg via INTRAVENOUS

## 2022-07-19 MED ORDER — ISOSORBIDE MONONITRATE ER 30 MG PO TB24
ORAL_TABLET | ORAL | Status: AC
  Filled 2022-07-19: qty 1

## 2022-07-19 MED ORDER — TRANEXAMIC ACID-NACL 1000-0.7 MG/100ML-% IV SOLN
1000.0000 mg | INTRAVENOUS | Status: AC
  Administered 2022-07-19: 1000 mg via INTRAVENOUS

## 2022-07-19 MED ORDER — POLYVINYL ALCOHOL 1.4 % OP SOLN: 1.0000 [drp] | OPHTHALMIC | Status: DC | PRN

## 2022-07-19 MED ORDER — MENTHOL 3 MG MT LOZG: 1.0000 | LOZENGE | OROMUCOSAL | Status: DC | PRN

## 2022-07-19 MED ORDER — CEFAZOLIN SODIUM-DEXTROSE 2-4 GM/100ML-% IV SOLN
INTRAVENOUS | Status: AC
  Filled 2022-07-19: qty 100

## 2022-07-19 MED ORDER — MIDAZOLAM HCL 2 MG/2ML IJ SOLN
INTRAMUSCULAR | Status: AC
  Filled 2022-07-19: qty 2

## 2022-07-19 MED ORDER — MIDAZOLAM HCL 5 MG/5ML IJ SOLN
INTRAMUSCULAR | Status: DC | PRN
  Administered 2022-07-19: 2 mg via INTRAVENOUS

## 2022-07-19 MED ORDER — LORATADINE 10 MG PO TABS: 10.0000 mg | ORAL_TABLET | Freq: Every day | ORAL | Status: DC | PRN

## 2022-07-19 MED ORDER — ORAL CARE MOUTH RINSE: 15.0000 mL | Freq: Once | OROMUCOSAL | Status: AC

## 2022-07-19 MED ORDER — DOCUSATE SODIUM 100 MG PO CAPS
ORAL_CAPSULE | ORAL | Status: AC
  Filled 2022-07-19: qty 1

## 2022-07-19 SURGICAL SUPPLY — 53 items
BASEPLATE TIBIAL LT SZ6 (Joint) IMPLANT
BLADE SAGITTAL AGGR TOOTH XLG (BLADE) ×1 IMPLANT
BLADE SAW SAG 25X90X1.19 (BLADE) ×1 IMPLANT
BOWL CEMENT MIX W/ADAPTER (MISCELLANEOUS) ×1 IMPLANT
BRUSH SCRUB EZ  4% CHG (MISCELLANEOUS) ×1
BRUSH SCRUB EZ 4% CHG (MISCELLANEOUS) ×1 IMPLANT
CEMENT BONE 40GM (Cement) ×2 IMPLANT
CHLORAPREP W/TINT 26 (MISCELLANEOUS) ×2 IMPLANT
COMP FEMORAL OXINUM SZ7 LEFT (Knees) ×1 IMPLANT
COMP PATELLA GENESIS 35MM (Stem) ×1 IMPLANT
COMPONENT FEMRL OXINUM SZ7 LT (Knees) IMPLANT
COMPONENT PATELLA GENESIS 35MM (Stem) IMPLANT
COOLER POLAR GLACIER W/PUMP (MISCELLANEOUS) ×1 IMPLANT
DRAPE 3/4 80X56 (DRAPES) ×2 IMPLANT
DRAPE INCISE IOBAN 66X60 STRL (DRAPES) ×1 IMPLANT
ELECT REM PT RETURN 9FT ADLT (ELECTROSURGICAL) ×1
ELECTRODE REM PT RTRN 9FT ADLT (ELECTROSURGICAL) ×1 IMPLANT
GAUZE SPONGE 4X4 12PLY STRL (GAUZE/BANDAGES/DRESSINGS) ×1 IMPLANT
GAUZE XEROFORM 1X8 LF (GAUZE/BANDAGES/DRESSINGS) ×1 IMPLANT
GLOVE BIO SURGEON STRL SZ8 (GLOVE) ×2 IMPLANT
GLOVE BIOGEL PI IND STRL 8.5 (GLOVE) ×2 IMPLANT
GLOVE PI ORTHO PRO STRL SZ8 (GLOVE) ×2 IMPLANT
GOWN STRL REUS W/ TWL XL LVL3 (GOWN DISPOSABLE) ×2 IMPLANT
GOWN STRL REUS W/TWL XL LVL3 (GOWN DISPOSABLE) ×2
HOOD PEEL AWAY T7 (MISCELLANEOUS) ×3 IMPLANT
INSERT TIB XLPE 9 SZ 5-6 (Insert) IMPLANT
IRRIGATION SURGIPHOR STRL (IV SOLUTION) ×1 IMPLANT
IV NS IRRIG 3000ML ARTHROMATIC (IV SOLUTION) ×1 IMPLANT
KIT TURNOVER KIT A (KITS) ×1 IMPLANT
MANIFOLD NEPTUNE II (INSTRUMENTS) ×1 IMPLANT
MAT ABSORB  FLUID 56X50 GRAY (MISCELLANEOUS) ×1
MAT ABSORB FLUID 56X50 GRAY (MISCELLANEOUS) ×1 IMPLANT
NDL SAFETY ECLIP 18X1.5 (MISCELLANEOUS) ×1 IMPLANT
NDL SPNL 20GX3.5 QUINCKE YW (NEEDLE) ×1 IMPLANT
NEEDLE SPNL 20GX3.5 QUINCKE YW (NEEDLE) ×1 IMPLANT
NS IRRIG 1000ML POUR BTL (IV SOLUTION) ×1 IMPLANT
PACK TOTAL KNEE (MISCELLANEOUS) ×1 IMPLANT
PAD ABD DERMACEA PRESS 5X9 (GAUZE/BANDAGES/DRESSINGS) ×2 IMPLANT
PAD DE MAYO PRESSURE PROTECT (MISCELLANEOUS) ×1 IMPLANT
PAD WRAPON POLAR KNEE (MISCELLANEOUS) ×1 IMPLANT
PULSAVAC PLUS IRRIG FAN TIP (DISPOSABLE) ×1
STAPLER SKIN PROX 35W (STAPLE) ×1 IMPLANT
SUCTION FRAZIER HANDLE 10FR (MISCELLANEOUS) ×1
SUCTION TUBE FRAZIER 10FR DISP (MISCELLANEOUS) ×1 IMPLANT
SUT DVC 2 QUILL PDO  T11 36X36 (SUTURE) ×1
SUT DVC 2 QUILL PDO T11 36X36 (SUTURE) ×1 IMPLANT
SUT VIC AB 2-0 CT1 18 (SUTURE) ×1 IMPLANT
SUT VIC AB PLUS 45CM 1-MO-4 (SUTURE) ×1 IMPLANT
SYR 30ML LL (SYRINGE) ×3 IMPLANT
TIP FAN IRRIG PULSAVAC PLUS (DISPOSABLE) ×1 IMPLANT
TRAP FLUID SMOKE EVACUATOR (MISCELLANEOUS) ×1 IMPLANT
WATER STERILE IRR 500ML POUR (IV SOLUTION) ×1 IMPLANT
WRAPON POLAR PAD KNEE (MISCELLANEOUS) ×1

## 2022-07-19 NOTE — H&P (Signed)
The patient has been re-examined, and the chart reviewed, and there have been no interval changes to the documented history and physical.  Plan a left total knee today.  Anesthesia is consulted regarding a peripheral nerve block for post-operative pain.  The risks, benefits, and alternatives have been discussed at length, and the patient is willing to proceed.     

## 2022-07-19 NOTE — Transfer of Care (Signed)
Immediate Anesthesia Transfer of Care Note  Patient: Dale Cook.  Procedure(s) Performed: TOTAL KNEE ARTHROPLASTY (Left: Knee)  Patient Location: PACU  Anesthesia Type:Spinal  Level of Consciousness: awake and drowsy  Airway & Oxygen Therapy: Patient Spontanous Breathing and Patient connected to face mask oxygen  Post-op Assessment: Report given to RN and Post -op Vital signs reviewed and stable  Post vital signs: Reviewed and stable  Last Vitals:  Vitals Value Taken Time  BP 110/65 07/19/22 1235  Temp    Pulse 54 07/19/22 1237  Resp 11 07/19/22 1237  SpO2 100 % 07/19/22 1237  Vitals shown include unvalidated device data.  Last Pain:  Vitals:   07/19/22 0755  TempSrc: Temporal  PainSc: 4          Complications: There were no known notable events for this encounter.

## 2022-07-19 NOTE — Op Note (Signed)
DATE OF SURGERY:  07/19/2022 TIME: 12:24 PM  PATIENT NAME:  Dale Cook.   AGE: 77 y.o.    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   PREOPERATIVE INDICATIONS:  Madden Garron. is an 76 y.o. 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  Radiographs have demonstrated tricompartmental osteoarthritis joint space narrowing, osteophytes, subchondral sclerosis and cyst formation.  The risks, benefits, and alternatives were discussed at length including but not limited to the risks of infection, bleeding, nerve or blood vessel injury, knee stiffness, fracture, dislocation, loosening or failure of the hardware and the need for further surgery. Medical risks include but not limited to DVT and pulmonary embolism, myocardial infarction, stroke, pneumonia, respiratory failure and death. I discussed these risks with the patient in my office prior to the date of surgery. They understood these risks and were willing to proceed.  OPERATIVE FINDINGS AND UNIQUE ASPECTS OF THE CASE:  All three compartments with advanced and severe degenerative changes, large osteophytes and an abundance of synovial fluid. Significant deformity was also noted. A decision was made to proceed with total knee arthroplasty.   OPERATIVE DESCRIPTION:  The patient was  brought to the operative room and placed in a supine position after undergoing placement of a general anesthetic. IV antibiotics were given. Patient received tranexamic acid. The lower extremity was prepped and draped in the usual sterile fashion.  A time out was performed to verify the patient's name, date of birth, medical record number, correct site of surgery and correct procedure to be performed. The timeout was also used to confirm the patient received antibiotics and that appropriate instruments, implants and radiographs studies were available in the room.  The leg was elevated and exsanguinated with an Esmarch and the tourniquet was inflated to 250 mmHg.  A midline incision was made over the left knee.. A medial parapatellar arthrotomy was then made and the patella subluxed laterally and the knee was brought into 90 of flexion. Hoffa's fat pad along with the anterior cruciate ligament was resected and the medial joint line was exposed.  Attention was then turned to preparation of the patella. The thickness of the patella was measured with a caliper, the diameter measured with the patella templates.  The patella resection was then made with an oscillating saw using the patella cutting guide.  The 35 mm button fit appropriately.  3 peg holes for the patella component were then drilled.  The extramedullary tibial cutting guide was then placed using the anterior tibial crest and second ray of the foot as a reference.  The tibial cutting guide was adjusted to allow for appropriate posterior slope.  The tibial cutting block was pinned into position. The slotted stylus was used to measure the proximal tibial resection of 9 mm off the high lateral side. Care was taken during the tibial resection to protect the medial and collateral ligaments.  The resected tibial bone was  removed.  The distal femur was resected using the intramedullary cutting guide.  Care was taken to protect the collateral ligaments  during distal femoral resection.  The distal femoral resection was performed with an oscillating saw. The femoral cutting guide was then removed. Extension gap was measured with a 9 mm spacer block and alignment and extension was confirmed using a long alignment rod. The femur was sized to be a 7. Rotation of the referencing guide was checked with the epicondylar axis and Whitesides line. Then the 4-in-1 cutting jig was then applied to the distal femur. A stylus was used to confirm that the anterior femur would not be notched.   Then the anterior, posterior and chamfer femoral cuts were then made with an oscillating saw.  The knee was distracted and all posterior osteophytes were removed.  The flexion gap was then measured with a flexion spacer block and long alignment rod and was found to be symmetric with the extension gap and perpendicular to mechanical axis of the tibia.  The proximal tibia plateau was then sized with trial trays. The best coverage was achieved with a size 6. This tibial tray was then pinned into position. The proximal tibia was then prepared with the keel punch.  After tibial preparation was completed, all trial components were inserted with polyethylene trials. The knee achieved full extension and flexed to 120 degrees. Ligament were stable to varus and valgus at full extension as well as 30, 60 and 90 degrees of flexion.   The trials were then placed. Knee was taken through a full range of motion and deemed to be stable with the trial components. All trial components were then removed.  The joint was copiously irrigated with pulse lavage.  The final total knee arthroplasty components were then cemented into place. The knee was held in extension while cement was allowed to cure.The knee was taken through a range of motion and the patella tracked well and the knee was again irrigated copiously.  The knee capsule was then injected with Exparel.  The medial arthrotomy was closed with #1  Vicryl and #2 Quill. The subcutaneous tissue closed with  2-0 vicryl, and skin approximated with staples.  A dry sterile and compressive dressing was applied.  A Polar Care was applied to the operative knee.  The patient was awakened and brought to the PACU in stable and satisfactory condition.  All sharp, lap and instrument counts were correct at the conclusion the case. I spoke with the patient's family in the postop consultation room to let them know the case had been performed without complication and the patient was stable in recovery room.   Total tourniquet time was 66 minutes.

## 2022-07-19 NOTE — Anesthesia Preprocedure Evaluation (Signed)
Anesthesia Evaluation  Patient identified by MRN, date of birth, ID band Patient awake    Reviewed: Allergy & Precautions, H&P , NPO status , Patient's Chart, lab work & pertinent test results, reviewed documented beta blocker date and time   Airway Mallampati: II   Neck ROM: full    Dental  (+) Poor Dentition   Pulmonary neg pulmonary ROS   Pulmonary exam normal        Cardiovascular Exercise Tolerance: Poor hypertension, On Medications + angina with exertion + CAD and +CHF  negative cardio ROS Normal cardiovascular exam+ Valvular Problems/Murmurs  Rhythm:regular Rate:Normal     Neuro/Psych negative neurological ROS  negative psych ROS   GI/Hepatic Neg liver ROS,GERD  Medicated,,  Endo/Other  negative endocrine ROS    Renal/GU negative Renal ROS  negative genitourinary   Musculoskeletal   Abdominal   Peds  Hematology negative hematology ROS (+)   Anesthesia Other Findings Past Medical History: No date: (HFpEF) heart failure with preserved ejection fraction ([place])     Comment:  a.) TTE 01/11/2017: EF 65-70%; b.) TTE 10/15/2020: EF               60-65%. mild LAE, mild MR/AR, G1DD No date: Anginal pain ([place]) No date: Aortic atherosclerosis ([facility]) No date: CAD (coronary artery disease)     Comment:  a.) LHC 01/09/17: LAD 95p, 29m, 60/90d, LCx 80p/m, 182m,               OM1 79m, OM2 90, RCA 99p; b.) 12/2016 CABG x 4 (LIMA-LAD,               VG-dLAD, VG-OM1-OM2); c.) 11/2018 MV: EF 66%, no isch; d.)              LHC/PCI 07/26/21: 100 LPAV, 90 p-mLCx (2.5 x 28 mm Synergy              XD DES), 85 p-mLAD, 70 OM1, 80 m-dLAD, 50 dLAD; LIMA-LAD               pat, SVG-dLAD and SVG-OM1/2 occ; e.) MV 07/13/2021: EF               60-65%, sm mod-sev part rev apical inf and mid inflat               defect No date: Cardiac murmur     Comment:  a.) grade II/VI early peaking systolic in aortic area No date: Essential hypertension No  date: GERD (gastroesophageal reflux disease) No date: High cholesterol No date: Left carotid bruit No date: Osteoarthritis No date: Wears dentures     Comment:  partial upper Past Surgical History: 06/12/2020: CATARACT EXTRACTION W/PHACO; Left     Comment:  Procedure: CATARACT EXTRACTION PHACO AND INTRAOCULAR               LENS PLACEMENT (IOC) LEFT 4.36 00:51.9 8.4%;  Surgeon:               Leandrew Koyanagi, MD;  Location: [facility];              Service: Ophthalmology;  Laterality: Left; 11/09/2016: COLONOSCOPY WITH PROPOFOL; N/A     Comment:  Procedure: COLONOSCOPY WITH PROPOFOL;  Surgeon: Robert Bellow, MD;  Location: [facility] ENDOSCOPY;  Service:               Endoscopy;  Laterality: N/A; 11/29/2019: COLONOSCOPY WITH PROPOFOL; N/A  Comment:  Procedure: COLONOSCOPY WITH PROPOFOL;  Surgeon: Earline Mayotte, MD;  Location: [facility] ENDOSCOPY;  Service:               Endoscopy;  Laterality: N/A; 01/12/2017: CORONARY ARTERY BYPASS GRAFT; N/A     Comment:  Procedure: CORONARY ARTERY BYPASS GRAFTING x 4 (LIMA to               mLAD, SVG to dLAD, sequential SVG to OM1-OM2, EVH via               left thigh and leg); Surgeon: Purcell Nails, MD;                Location: [facility] OR;  Service: Open Heart Surgery;                Laterality: N/A; 08/11/2021: CORONARY STENT INTERVENTION; N/A     Comment:  Procedure: CORONARY STENT INTERVENTION;  Surgeon: Iran Ouch, MD;  Location: [facility];  Service:               Cardiovascular;  Laterality: N/A; 06/01/2022: ECTROPION REPAIR; Bilateral 01/12/2017: INTRAOPERATIVE TRANSESOPHAGEAL ECHOCARDIOGRAM; N/A     Comment:  Procedure: INTRAOPERATIVE TRANSESOPHAGEAL               ECHOCARDIOGRAM;  Surgeon: Purcell Nails, MD;                Location: [facility] OR;  Service: Open Heart Surgery;                Laterality: N/A; 08/11/2021: INTRAVASCULAR ULTRASOUND/IVUS; N/A     Comment:   Procedure: Intravascular Ultrasound/IVUS;  Surgeon:               Iran Ouch, MD;  Location: [facility] INVAS[facility] CV [facility];                Service: Cardiovascular;  Laterality: N/A; 01/09/2017: LEFT HEART CATH AND CORONARY ANGIOGRAPHY; Left     Comment:  Procedure: Left Heart Cath and Coronary Angiography;                Surgeon: Iran Ouch, MD;  Location: [facility] INVASIVE               CV LAB;  Service: Cardiovascular;  Laterality: Left; 07/26/2021: LEFT HEART CATH AND CORS/GRAFTS ANGIOGRAPHY; N/A     Comment:  Procedure: LEFT HEART CATH AND CORS/GRAFTS ANGIOGRAPHY;               Surgeon: Iran Ouch, MD;  Location: [facility] INVASIVE               CV LAB;  Service: Cardiovascular;  Laterality: N/A; 01/18/2021: TOTAL KNEE ARTHROPLASTY; Right     Comment:  Procedure: TOTAL KNEE ARTHROPLASTY;  Surgeon: Lyndle Herrlich, MD;  Location: [facility] ORS;  Service: Orthopedics;               Laterality: Right; BMI    Body Mass Index: 30.24 kg/m     Reproductive/Obstetrics negative OB ROS                             Anesthesia Physical Anesthesia Plan  ASA: 3  Anesthesia Plan: Spinal   Post-op Pain Management:    Induction:   PONV Risk Score and Plan: 2  Airway Management Planned:   Additional Equipment:   Intra-op Plan:   Post-operative Plan:   Informed Consent: I have reviewed the patients History and Physical, chart, labs and discussed the procedure including the risks, benefits and alternatives for the proposed anesthesia with the patient or authorized representative who has indicated his/her understanding and acceptance.     Dental Advisory Given  Plan Discussed with: CRNA  Anesthesia Plan Comments:        Anesthesia Quick Evaluation

## 2022-07-19 NOTE — Evaluation (Signed)
Physical Therapy Evaluation Patient Details Name: Dale Cook. MRN: [id number] DOB: 1946-03-15 Today's Date: 07/19/2022  History of Present Illness  Pt admitted for L TKR. HIstory includes AKI, CAD, HLD, and R TKR in 2022, and CABG.  Clinical Impression  Pt is a pleasant 77 year old male who was admitted for L TKR. Pt performs bed mobility with min assist and transfers with mod assist. Pt demonstrates deficits with strength/mobility. Pt is very motivated to participate. Would benefit from skilled PT to address above deficits and promote optimal return to PLOF. Secondary to spinal anesthesia, unsafe for further mobilization this date. Poor trunkal control. Will continue to assess in AM.      Recommendations for follow up therapy are one component of a multi-disciplinary discharge planning process, led by the attending physician.  Recommendations may be updated based on patient status, additional functional criteria and insurance authorization.  Follow Up Recommendations Follow physician's recommendations for discharge plan and follow up therapies (anticipate updating recs once spinal clear)      Assistance Recommended at Discharge    Patient can return home with the following  Two people to help with walking and/or transfers;Two people to help with bathing/dressing/bathroom;Help with stairs or ramp for entrance    Equipment Recommendations  (TBD)  Recommendations for Other Services       Functional Status Assessment Patient has had a recent decline in their functional status and demonstrates the ability to make significant improvements in function in a reasonable and predictable amount of time.     Precautions / Restrictions Precautions Precautions: Fall;Knee Precaution Booklet Issued: No Restrictions Weight Bearing Restrictions: Yes LLE Weight Bearing: Weight bearing as tolerated      Mobility  Bed Mobility Overal bed mobility: Needs Assistance Bed Mobility: Supine  to Sit     Supine to sit: Min assist     General bed mobility comments: B LE weak, needs assist. Poor trunkal balance.    Transfers Overall transfer level: Needs assistance Equipment used: Rolling walker (2 wheels) Transfers: Sit to/from Stand Sit to Stand: Mod assist           General transfer comment: poor standing tolerance with increase trunkal sway. Attributes to +spinal although demonstrates adequate strength while supine/sit to tolerate standing. Unsafe to further progress    Ambulation/Gait               General Gait Details: not safe to attempt  Stairs            Wheelchair Mobility    Modified [place] (Stroke Patients Only)       Balance Overall balance assessment: Needs assistance Sitting-balance support: Feet supported Sitting balance-Leahy Scale: Fair     Standing balance support: Bilateral upper extremity supported Standing balance-Leahy Scale: Poor                               Pertinent Vitals/Pain Pain Assessment Pain Assessment: No/denies pain    Home Living Family/patient expects to be discharged to:: Private residence Living Arrangements: Alone Available Help at Discharge: Family;Available PRN/intermittently Type of Home: Apartment Home Access: Stairs to enter Entrance Stairs-Rails: Can reach both;Left;Right Entrance Stairs-Number of Steps: 17   Home Layout: One level Home Equipment: Conservation officer, nature (2 wheels);Cane - quad;BSC/3in1      Prior Function Prior Level of Function : Independent/Modified Independent             Mobility Comments: Ind amb community  distances without an AD, no fall history ADLs Comments: Ind with ADLs     Hand Dominance        Extremity/Trunk Assessment   Upper Extremity Assessment Upper Extremity Assessment: Overall WFL for tasks assessed    Lower Extremity Assessment Lower Extremity Assessment: Generalized weakness (B LE grossly 3+/5)       Communication    Communication: No difficulties  Cognition Arousal/Alertness: Awake/alert Behavior During Therapy: WFL for tasks assessed/performed Overall Cognitive Status: Within Functional Limits for tasks assessed                                          General Comments      Exercises Total Joint Exercises Goniometric ROM: L TKR 5-90 degrees   Assessment/Plan    PT Assessment Patient needs continued PT services  PT Problem List Decreased strength;Decreased activity tolerance;Decreased balance;Decreased mobility;Impaired sensation       PT Treatment Interventions DME instruction;Gait training;Stair training;Therapeutic exercise;Balance training    PT Goals (Current goals can be found in the Care Plan section)  Acute Rehab PT Goals Patient Stated Goal: to go home PT Goal Formulation: With patient Time For Goal Achievement: 08/02/22 Potential to Achieve Goals: Good    Frequency BID     Co-evaluation               AM-PAC PT "6 Clicks" Mobility  Outcome Measure Help needed turning from your back to your side while in a flat bed without using bedrails?: A Little Help needed moving from lying on your back to sitting on the side of a flat bed without using bedrails?: A Little Help needed moving to and from a bed to a chair (including a wheelchair)?: Total Help needed standing up from a chair using your arms (e.g., wheelchair or bedside chair)?: A Lot Help needed to walk in hospital room?: Total Help needed climbing 3-5 steps with a railing? : Total 6 Click Score: 11    End of Session Equipment Utilized During Treatment: Gait belt Activity Tolerance: Patient tolerated treatment well Patient left: in bed;with bed alarm set;with family/visitor present;with SCD's reapplied Nurse Communication: Mobility status PT Visit Diagnosis: Unsteadiness on feet (R26.81);Muscle weakness (generalized) (M62.81);Difficulty in walking, not elsewhere classified (R26.2)    Time:  2725-3664 PT Time Calculation (min) (ACUTE ONLY): 16 min   Charges:   PT Evaluation $PT Eval Low Complexity: 1 Low          Greggory Stallion, PT, DPT, GCS [phone]   [name] 07/19/2022, 5:02 PM

## 2022-07-19 NOTE — Anesthesia Procedure Notes (Signed)
Spinal  Patient location during procedure: OR Start time: 07/19/2022 10:25 AM End time: 07/19/2022 10:34 AM Reason for block: surgical anesthesia Staffing Performed: anesthesiologist, resident/CRNA and other anesthesia staff  Anesthesiologist: Molli Barrows, MD Resident/CRNA: Patience Musca., CRNA Other anesthesia staff: Delphia Grates, RN Performed by: Patience Musca., CRNA Authorized by: Molli Barrows, MD   Preanesthetic Checklist Completed: patient identified, IV checked, site marked, risks and benefits discussed, surgical consent, monitors and equipment checked, pre-op evaluation and timeout performed Spinal Block Patient position: sitting Prep: Betadine Patient monitoring: heart rate, continuous pulse ox, blood pressure and cardiac monitor Approach: midline Location: L4-5 Injection technique: single-shot Needle Needle type: Quincke  Needle gauge: 22 G Needle length: 9 cm Assessment Events: CSF return Additional Notes Negative paresthesia. Negative blood return. Positive free-flowing CSF. Expiration date of kit checked and confirmed. Patient tolerated procedure well, without complications.

## 2022-07-20 ENCOUNTER — Encounter: Payer: Self-pay | Admitting: Orthopedic Surgery

## 2022-07-20 DIAGNOSIS — M1712 Unilateral primary osteoarthritis, left knee: Secondary | ICD-10-CM | POA: Diagnosis not present

## 2022-07-20 MED ORDER — CEFAZOLIN SODIUM-DEXTROSE 2-4 GM/100ML-% IV SOLN
INTRAVENOUS | Status: AC
  Filled 2022-07-20: qty 100

## 2022-07-20 MED ORDER — HYDROCODONE-ACETAMINOPHEN 7.5-325 MG PO TABS
ORAL_TABLET | ORAL | Status: AC
  Filled 2022-07-20: qty 1

## 2022-07-20 MED ORDER — KETOROLAC TROMETHAMINE 15 MG/ML IJ SOLN
INTRAMUSCULAR | Status: AC
  Filled 2022-07-20: qty 1

## 2022-07-20 NOTE — Discharge Instructions (Signed)
Continue weight bear as tolerated on the left lower extremity.    Elevate the left lower extremity whenever possible and continue the polar care while elevating the extremity. Patient may shower. No bath or submerging the wound.    Take plavix and aspirin home dose as directed for blood clot prevention.  Continue to work on knee range of motion exercises at home as instructed by physical therapy. Continue to use a walker for assistance with ambulation until cleared by physical therapy.  Call [phone] with any questions, such as fever > 101.5 degrees, drainage from the wound or shortness of breath.

## 2022-07-20 NOTE — Anesthesia Postprocedure Evaluation (Signed)
Anesthesia Post Note  Patient: Dale Cook.  Procedure(s) Performed: TOTAL KNEE ARTHROPLASTY (Left: Knee)  Patient location during evaluation: PACU Anesthesia Type: Spinal Level of consciousness: awake and alert Pain management: pain level controlled Vital Signs Assessment: post-procedure vital signs reviewed and stable Respiratory status: spontaneous breathing, nonlabored ventilation, respiratory function stable and patient connected to nasal cannula oxygen Cardiovascular status: blood pressure returned to baseline and stable Postop Assessment: no apparent nausea or vomiting Anesthetic complications: no   There were no known notable events for this encounter.   Last Vitals:  Vitals:   07/20/22 0717 07/20/22 1021  BP: (!) 148/75 139/67  Pulse: 66 73  Resp: 16   Temp: 36.7 C 36.7 C  SpO2: 96% 94%    Last Pain:  Vitals:   07/20/22 1021  TempSrc: Oral  PainSc: 0-No pain                 Molli Barrows

## 2022-07-20 NOTE — Discharge Summary (Signed)
  Subjective:  Patient reports pain as mild.    Objective:   VITALS:   Vitals:   07/19/22 1814 07/19/22 2230 07/20/22 0541 07/20/22 0717  BP: (!) 142/65 (!) 145/72 (!) 147/72 (!) 148/75  Pulse: (!) 59 (!) 55 65 66  Resp: 17 16 18 16   Temp: 97.7 F (36.5 C) 98.1 F (36.7 C) 97.6 F (36.4 C) 98.1 F (36.7 C)  TempSrc: Temporal  Tympanic Temporal  SpO2: 95% 97% 95% 96%  Weight:      Height:        PHYSICAL EXAM:  Neurologically intact ABD soft Neurovascular intact Sensation intact distally Intact pulses distally Dorsiflexion/Plantar flexion intact Incision: dressing C/D/I and drsg changed No cellulitis present Compartment soft  LABS  No results found for this or any previous visit (from the past 24 hour(s)).  No results found.  Assessment/Plan: 1 Day Post-Op   Principal Problem:   S/P total knee replacement, left   Up with therapy May discharge home after PT goals met today   Carlynn Spry , PA-C 07/20/2022, 7:53 AM

## 2022-07-20 NOTE — Progress Notes (Signed)
  Subjective:  Patient reports pain as mild.    Objective:   VITALS:   Vitals:   07/19/22 1814 07/19/22 2230 07/20/22 0541 07/20/22 0717  BP: (!) 142/65 (!) 145/72 (!) 147/72 (!) 148/75  Pulse: (!) 59 (!) 55 65 66  Resp: 17 16 18 16   Temp: 97.7 F (36.5 C) 98.1 F (36.7 C) 97.6 F (36.4 C) 98.1 F (36.7 C)  TempSrc: Temporal  Tympanic Temporal  SpO2: 95% 97% 95% 96%  Weight:      Height:        PHYSICAL EXAM:  Neurologically intact ABD soft Neurovascular intact Sensation intact distally Intact pulses distally Dorsiflexion/Plantar flexion intact Incision: dressing C/D/I No cellulitis present Compartment soft  LABS  No results found for this or any previous visit (from the past 24 hour(s)).  No results found.  Assessment/Plan: 1 Day Post-Op   Principal Problem:   S/P total knee replacement, left   Up with therapy Discharge home today after PT goals met   Carlynn Spry , PA-C 07/20/2022, 7:50 AM

## 2022-07-20 NOTE — Progress Notes (Signed)
Physical Therapy Treatment Patient Details Name: Dale Cook. MRN: [id number] DOB: 1946/05/17 Today's Date: 07/20/2022   History of Present Illness Pt admitted for L TKR. HIstory includes AKI, CAD, HLD, and R TKR in 2022, and CABG.    PT Comments    Pt is making good progress towards goals and is safe to dc home this date. Pt with improved strength/balance and is able to complete all goals for PT. Good ROM noted. HEP given and reviewed. Able to complete stair training with safe technique.     Recommendations for follow up therapy are one component of a multi-disciplinary discharge planning process, led by the attending physician.  Recommendations may be updated based on patient status, additional functional criteria and insurance authorization.  Follow Up Recommendations  Home health PT     Assistance Recommended at Discharge PRN  Patient can return home with the following A little help with walking and/or transfers;A little help with bathing/dressing/bathroom;Help with stairs or ramp for entrance   Equipment Recommendations  None recommended by PT    Recommendations for Other Services       Precautions / Restrictions Precautions Precautions: Fall;Knee Precaution Booklet Issued: Yes (comment) Restrictions Weight Bearing Restrictions: Yes LLE Weight Bearing: Weight bearing as tolerated     Mobility  Bed Mobility Overal bed mobility: Needs Assistance Bed Mobility: Supine to Sit     Supine to sit: Supervision     General bed mobility comments: safe technique performed with upright posture    Transfers Overall transfer level: Needs assistance Equipment used: Rolling walker (2 wheels) Transfers: Sit to/from Stand Sit to Stand: Supervision           General transfer comment: safe technique with upright posture. RW used    Ambulation/Gait Ambulation/Gait assistance: Counsellor (Feet): 200 Feet Assistive device: Rolling walker (2  wheels) Gait Pattern/deviations: Step-through pattern       General Gait Details: ambulated around RN station with reciprocal gait pattern. Upright posture with cues for looking forward and not down at floor. Fluid gait   Stairs Stairs: Yes Stairs assistance: Min guard Stair Management: One rail Right, Step to pattern, Forwards Number of Stairs: 4 General stair comments: up/down with safe technique and use of 1 railing. Cues for step to gait pattern.   Wheelchair Mobility    Modified C[place]ti (Stroke Patients Only)       Balance Overall balance assessment: Needs assistance Sitting-balance support: Feet supported Sitting balance-Leahy Scale: Good     Standing balance support: Bilateral upper extremity supported Standing balance-Leahy Scale: Good                              Cognition Arousal/Alertness: Awake/alert Behavior During Therapy: WFL for tasks assessed/performed Overall Cognitive Status: Within Functional Limits for tasks assessed                                          Exercises Total Joint Exercises Goniometric ROM: L TKR 0-95 degrees Other Exercises Other Exercises: supine ther-ex performed on L LE including AP, quad sets, SLRs, hip ad/add, SAQ, LAQ, and heel slides. 10 reps with supervision.    General Comments        Pertinent Vitals/Pain Pain Assessment Pain Assessment: 0-10 Pain Score: 5  Pain Location: L knee Pain Descriptors / Indicators: Operative site guarding  Pain Intervention(s): Limited activity within patient's tolerance, Ice applied    Home Living                          Prior Function            PT Goals (current goals can now be found in the care plan section) Acute Rehab PT Goals Patient Stated Goal: to go home PT Goal Formulation: With patient Time For Goal Achievement: 08/02/22 Potential to Achieve Goals: Good Progress towards PT goals: Progressing toward goals     Frequency    BID      PT Plan Current plan remains appropriate    Co-evaluation              AM-PAC PT "6 Clicks" Mobility   Outcome Measure  Help needed turning from your back to your side while in a flat bed without using bedrails?: A Little Help needed moving from lying on your back to sitting on the side of a flat bed without using bedrails?: A Little Help needed moving to and from a bed to a chair (including a wheelchair)?: A Little Help needed standing up from a chair using your arms (e.g., wheelchair or bedside chair)?: A Little Help needed to walk in hospital room?: A Little Help needed climbing 3-5 steps with a railing? : A Little 6 Click Score: 18    End of Session Equipment Utilized During Treatment: Gait belt Activity Tolerance: Patient tolerated treatment well Patient left: in chair Nurse Communication: Mobility status PT Visit Diagnosis: Unsteadiness on feet (R26.81);Muscle weakness (generalized) (M62.81);Difficulty in walking, not elsewhere classified (R26.2)     Time: 3419-3790 PT Time Calculation (min) (ACUTE ONLY): 23 min  Charges:  $Gait Training: 8-22 mins $Therapeutic Exercise: 8-22 mins                     Greggory Stallion, PT, DPT, GCS [phone]    Dale Cook 07/20/2022, 11:11 AM

## 2022-07-20 NOTE — TOC Progression Note (Signed)
Transition of Care (TOC) - Progression Note    Patient Details  Name: Dale Cook. MRN: [id number] Date of Birth: [birth year]  Transition of Care [facility]) CM/SW Contact  Conception Oms, RN Phone Number: 07/20/2022, 12:01 PM  Clinical Narrative:   Sent referral to [place] [facility] for [facility] services       Barriers to Discharge: No Barriers Identified  Expected Discharge Plan and Services   Discharge Planning Services: CM Consult   Living arrangements for the past 2 months: Single Family Home Expected Discharge Date: 07/20/22               DME Arranged: N/A DME Agency: NA                   Social Determinants of Health ([place]) Interventions SDOH Screenings   Food Insecurity: No Food Insecurity (07/19/2022)  Housing: Low Risk  (07/19/2022)  Transportation Needs: No Transportation Needs (07/19/2022)  Utilities: Not At Risk (07/19/2022)  Depression (PHQ2-9): Low Risk  (04/05/2022)  Physical Activity: Sufficiently Active (12/23/2020)  Tobacco Use: Low Risk  (07/20/2022)    Readmission Risk Interventions     No data to display

## 2022-07-20 NOTE — Progress Notes (Signed)
Pt slept well before going to bed pt ambulated to the nurses station with the walker accompanied by RN, pt tolerated sitting in the chair for a few hours. Foley catheter D/C'd this morning at 0545, pt tolerated well.

## 2022-08-08 ENCOUNTER — Telehealth: Payer: Self-pay | Admitting: Physician Assistant

## 2022-08-08 NOTE — Telephone Encounter (Signed)
Left message to call back  

## 2022-08-08 NOTE — Telephone Encounter (Signed)
Please contact pt for future appointment. Pt due for 6 month f/u. 

## 2022-08-08 NOTE — Telephone Encounter (Signed)
New Message:      Patient wants to know if he can get patient assistance with his Dale Cook please?

## 2022-08-08 NOTE — Telephone Encounter (Signed)
*  STAT* If patient is at the pharmacy, call can be transferred to refill team.   1. Which medications need to be refilled? (please list name of each medication and dose if known) new prescription for Entresto or something less expensive  2. Which pharmacy/location (including street and city if local pharmacy) is medication to be sent to?  [facility] [address] [place]  3. Do they need a 30 day or 90 day supply? 30 days

## 2022-08-09 NOTE — Telephone Encounter (Signed)
I called and spoke with a [employer] Rep. According to an 01/28/22 phone note, the patient was approved for assistance for Entresto through 02/02/23.  Per the [employer] rep, the patient does not need to fill out a new application for entresto until 01/2023. He has 2 refills left on his [employer] and will just need to call to request these.   I have called and notified the patient of the above. He states he called for the refill and was told by a ref he did not have any left on his RX. I have asked him to call back to [employer] as I have just verified with them that he has 2 refills left on his [employer]. The last refill went out to him in 04/2022.  The patient voices understanding and is agreeable. He will call back if further assistance is needed.

## 2022-08-10 NOTE — Telephone Encounter (Signed)
Patient is requesting a drug less expensive than Entresto if appropriate. Not sure if he would qualify for patient assistance. Please review. Thank you!

## 2022-08-10 NOTE — Telephone Encounter (Signed)
Scheduled 09/19/22

## 2022-08-11 NOTE — Telephone Encounter (Signed)
Called the patient. He stated that this was already taken care of and he had the [facility].

## 2022-08-31 ENCOUNTER — Other Ambulatory Visit: Payer: Self-pay | Admitting: Physician Assistant

## 2022-09-19 ENCOUNTER — Encounter: Payer: Self-pay | Admitting: Physician Assistant

## 2022-09-19 ENCOUNTER — Ambulatory Visit: Payer: Medicare PPO | Attending: Physician Assistant | Admitting: Physician Assistant

## 2022-09-19 VITALS — BP 118/60 | HR 65 | Ht 72.0 in | Wt 225.4 lb

## 2022-09-19 DIAGNOSIS — I5032 Chronic diastolic (congestive) heart failure: Secondary | ICD-10-CM | POA: Diagnosis not present

## 2022-09-19 DIAGNOSIS — E785 Hyperlipidemia, unspecified: Secondary | ICD-10-CM | POA: Diagnosis not present

## 2022-09-19 DIAGNOSIS — I10 Essential (primary) hypertension: Secondary | ICD-10-CM | POA: Diagnosis not present

## 2022-09-19 DIAGNOSIS — I25810 Atherosclerosis of coronary artery bypass graft(s) without angina pectoris: Secondary | ICD-10-CM

## 2022-09-19 DIAGNOSIS — Z01810 Encounter for preprocedural cardiovascular examination: Secondary | ICD-10-CM

## 2022-09-19 NOTE — Patient Instructions (Signed)
Medication Instructions:  Your physician recommends that you continue on your current medications as directed. Please refer to the Current Medication list given to you today.  *If you need a refill on your cardiac medications before your next appointment, please call your pharmacy*   Lab Work: No labs ordered  If you have labs (blood work) drawn today and your tests are completely normal, you will receive your results only by: MyChart Message (if you have MyChart) OR A paper copy in the mail If you have any lab test that is abnormal or we need to change your treatment, we will call you to review the results.   Testing/Procedures: No testing ordered  Follow-Up: At [facility], you and your health needs are our priority.  As part of our continuing mission to provide you with exceptional heart care, we have created designated Provider Care Teams.  These Care Teams include your primary Cardiologist (physician) and Advanced Practice Providers (APPs -  Physician Assistants and Nurse Practitioners) who all work together to provide you with the care you need, when you need it.  We recommend signing up for the patient portal called "MyChart".  Sign up information is provided on this After Visit Summary.  MyChart is used to connect with patients for Virtual Visits (Telemedicine).  Patients are able to view lab/test results, encounter notes, upcoming appointments, etc.  Non-urgent messages can be sent to your provider as well.   To learn more about what you can do with MyChart, go to [email or web address].    Your next appointment:   6 month(s)  Provider:   You may see Muhammad Arida, MD or one of the following Advanced Practice Providers on your designated Care Team:   Christopher Berge, NP Ryan Dunn, PA-C Cadence Furth, PA-C Sheri Hammock, NP  

## 2022-09-19 NOTE — Progress Notes (Signed)
Cardiology Office Note    Date:  09/19/2022   ID:  Dale Cook., DOB 13-Sep-1945, MRN [id number]  PCP:  Albina Billet, MD  Cardiologist:  Kathlyn Sacramento, MD  Electrophysiologist:  None   Chief Complaint: Follow-up  History of Present Illness:   Dale Cook. is a 77 y.o. male with history of CAD status post CABG in 12/2016 s/p PCI/DES to the LCx on 08/11/2021, HFpEF, HTN, and HLD who presents for follow up of CAD and HFpEF.    He presented in 2018 with unstable angina with LHC showing severe three-vessel CAD.  He subsequently underwent four-vessel CABG in 12/2016 with LIMA to mid LAD, SVG to distal LAD, jump graft with SVG to OM1 and OM 2.  Carotid artery ultrasound prior to surgery showed no significant disease.  Echo showed normal LV systolic function with no significant valvular abnormalities.  Lexiscan MPI on 07/13/2021 showed a small in size, moderate in severity, partially reversible defect involving the apical inferior and mid inferolateral segments most consistent with scar and peri-infarct ischemia.  Compared to prior study in 2020, the mid/apical inferior, inferolateral defect was new. LVEF normal. Due to persistent symptoms, he underwent LHC on 07/26/2021, that showed significant underlying 3-vessel CAD with patent LIMA to mid LAD, and occluded SVG to distal LAD and SVG to OM1/OM2. The RCA was not injected and known to be a small nondominant vessel and occluded in the mid segment.  There was normal LVSF and mildly elevated LVEDP. The LAD was significantly diseased after the LIMA anastomosis and likely not suitable for PCI given the extension of disease to the apical LAD. The LCx could be treated with PCI, but at the bifurcation lesion would require a long segment stenting with recommendation for staged PCI at [facility].     He was admitted to [facility] in 07/2021 with dyspnea concerning for anginal equivalent along with volume depletion. Imdur was added and ultimately, the  decision was made to move forward with planned LHC with Dr. Fletcher Anon at [facility] the following week.    He underwent planned repeat LHC at [facility] on 08/11/2021 with successful IVUS-guide PCI/DES to the LCx using 1 long stent ([id number]). There was significant vessel size mismatch between the distal and proximal area with the distal segment 2.5 mm in diameter and the proximal area 3.5 mm in diameter. The stent was post-dilated to the maximal stent ID, 3.5 mm. The OM2 had borderline disease proximally, though was supplied by good flow from the Y graft from OM3.     He was seen in the ED on 09/22/2021 with intermittent ankle and feet swelling.  BNP 131.  CRP and sed rate elevated.  X-ray revealed subacute to chronic nondisplaced fracture of the distal tip of both medial malleoli along with osteoarthritis noted bilaterally.  Symptoms were felt to be orthopedic in etiology.   He was seen in the ED on 11/04/2021 with shortness of breath and intermittent lower extremity swelling.  High-sensitivity troponin negative.  BNP 180.  He was given IV Lasix in the ED with recommendation for outpatient follow-up.   He was seen in the office on 11/25/2021, and was without symptoms of angina or decompensation.  He continued to note intermittent bilateral ankle/pedal edema as well as bilateral knee and foot pain that were attributed to osteoarthritis.  His weight was down 4 pounds when compared to his prior visit.  Amlodipine was discontinued and he was initiated on Entresto.  He  was last seen in our office in 01/2022 and remained without symptoms of angina or cardiac decompensation.  No lower extremity swelling.  His weight was up 12 pounds by our scale, which she attributed to increased caloric intake.  Since we last saw him, he underwent left TKA without cardiac complication.  He comes in doing well from a cardiac perspective and is without symptoms of angina or decompensation.  No dyspnea, palpitations, dizziness, presyncope,  or syncope.  No falls or symptoms concerning for bleeding.  He is adherent and tolerating cardiac medications without issues.  He does not add salt to food.  Some days he drinks a little over 2 L of liquids daily, other days not.  His weight is stable.  He is finishing up PT for his knee arthroplasty.  He indicates he will be needing to undergo another ectropion surgery.  He is able to achieve greater than 4 METS without cardiac limitation.   Labs independently reviewed: 06/2022 - potassium 3.8, BUN 18, serum creatinine 1.28, Hgb 14.6, PLT 194 04/2021 - magnesium 2.5, TC 90, TG 56, HDL 19, LDL 60, A1c 6.6, albumin 3.1, AST/ALT normal 11/2018 - TSH normal   Past Medical History:  Diagnosis Date   (HFpEF) heart failure with preserved ejection fraction ([place])    a.) TTE 01/11/2017: EF 65-70%; b.) TTE 10/15/2020: EF 60-65%. mild LAE, mild MR/AR, G1DD   Anginal pain (HCC)    Aortic atherosclerosis (HCC)    CAD (coronary artery disease)    a.) LHC 01/09/17: LAD 95p, 10m, 60/90d, LCx 80p/m, 181m, OM1 92m, OM2 90, RCA 99p; b.) 12/2016 CABG x 4 (LIMA-LAD, VG-dLAD, VG-OM1-OM2); c.) 11/2018 MV: EF 66%, no isch; d.) LHC/PCI 07/26/21: 100 LPAV, 90 p-mLCx (2.5 x 28 mm Synergy XD DES), 85 p-mLAD, 70 OM1, 80 m-dLAD, 50 dLAD; LIMA-LAD pat, SVG-dLAD and SVG-OM1/2 occ; e.) MV 07/13/2021: EF 60-65%, sm mod-sev part rev apical inf and mid inflat defect   Cardiac murmur    a.) grade II/VI early peaking systolic in aortic area   Essential hypertension    GERD (gastroesophageal reflux disease)    High cholesterol    Left carotid bruit    Osteoarthritis    Wears dentures    partial upper    Past Surgical History:  Procedure Laterality Date   CATARACT EXTRACTION W/PHACO Left 06/12/2020   Procedure: CATARACT EXTRACTION PHACO AND INTRAOCULAR LENS PLACEMENT (IOC) LEFT 4.36 00:51.9 8.4%;  Surgeon: Leandrew Koyanagi, MD;  Location: [place];  Service: Ophthalmology;  Laterality: Left;   COLONOSCOPY WITH  PROPOFOL N/A 11/09/2016   Procedure: COLONOSCOPY WITH PROPOFOL;  Surgeon: Robert Bellow, MD;  Location: [facility];  Service: Endoscopy;  Laterality: N/A;   COLONOSCOPY WITH PROPOFOL N/A 11/29/2019   Procedure: COLONOSCOPY WITH PROPOFOL;  Surgeon: Robert Bellow, MD;  Location: [facility] ENDOS[facility];  Service: Endoscopy;  Laterality: N/A;   CORONARY ARTERY BYPASS GRAFT N/A 01/12/2017   Procedure: CORONARY ARTERY BYPASS GRAFTING x 4 (LIMA to mLAD, SVG to dLAD, sequential SVG to OM1-OM2, EVH via left thigh and leg); Surgeon: Rexene Alberts, MD;  Location: [place];  Service: Open Heart Surgery;  Laterality: N/A;   CORONARY STENT INTERVENTION N/A 08/11/2021   Procedure: CORONARY STENT INTERVENTION;  Surgeon: Wellington Hampshire, MD;  Location: [place] [facility];  Service: Cardiovascular;  Laterality: N/A;   ECTROPION REPAIR Bilateral 06/01/2022   INTRAOPERATIVE TRANSESOPHAGEAL ECHOCARDIOGRAM N/A 01/12/2017   Procedure: INTRAOPERATIVE TRANSESOPHAGEAL ECHOCARDIOGRAM;  Surgeon: Rexene Alberts, MD;  Location[facility];  Service: Open Heart Surgery;  Laterality: N/A;   INTRAVASCULAR ULTRASOUND/IVUS N/A 08/11/2021   Procedure: Intravascular Ultrasound/IVUS;  Surgeon: Wellington Hampshire, MD;  Location: [place] [facility];  Service: Cardiovascular;  Laterality: N/A;   LEFT HEART CATH AND CORONARY ANGIOGRAPHY Left 01/09/2017   Procedure: Left Heart Cath and Coronary Angiography;  Surgeon: Wellington Hampshire, MD;  Location: [place] [facility];  Service: Cardiovascular;  Laterality: Left;   LEFT HEART CATH AND CORS/GRAFTS ANGIOGRAPHY N/A 07/26/2021   Procedure: LEFT HEART CATH AND CORS/GRAFTS ANGIOGRAPHY;  Surgeon: Wellington Hampshire, MD;  Location: [place] [facility];  Service: Cardiovascular;  Laterality: N/A;   TOTAL KNEE ARTHROPLASTY Right 01/18/2021   Procedure: TOTAL KNEE ARTHROPLASTY;  Surgeon: Lovell Sheehan, MD;  Location: [facility];  Service: Orthopedics;  Laterality: Right;   TOTAL KNEE  ARTHROPLASTY Left 07/19/2022   Procedure: TOTAL KNEE ARTHROPLASTY;  Surgeon: Lovell Sheehan, MD;  Location: [facility] OR[facility];  Service: Orthopedics;  Laterality: Left;    Current Medications: Current Meds  Medication Sig   acetaminophen (TYLENOL) 325 MG tablet Take 2 tablets (650 mg total) by mouth every 4 (four) hours as needed for headache or mild pain.   aspirin 81 MG chewable tablet Chew 81 mg by mouth daily.   atorvastatin (LIPITOR) 40 MG tablet Take 40 mg by mouth daily.   carvedilol (COREG) 25 MG tablet TAKE 1 TABLET TWICE DAILY   clopidogrel (PLAVIX) 75 MG tablet TAKE 1 TABLET BY MOUTH EVERY DAY   furosemide (LASIX) 20 MG tablet Take 20 mg by mouth every other day.   isosorbide mononitrate (IMDUR) 30 MG 24 hr tablet Take 1 tablet (30 mg total) by mouth daily.   loratadine (CLARITIN) 10 MG tablet Take 10 mg by mouth daily as needed for allergies.   Multiple Vitamin (MULTIVITAMIN) capsule Take 1 capsule by mouth daily.   neomycin-polymyxin b-dexamethasone (MAXITROL) 3.5-10000-0.1 SUSP Place 1 drop into both eyes as needed (dry eye).   pantoprazole (PROTONIX) 40 MG tablet TAKE 1 TABLET EVERY DAY    Allergies:   Patient has no known allergies.   Social History   Socioeconomic History   Marital status: Divorced    Spouse name: Not on file   Number of children: 3   Years of education: Not on file   Highest education level: Not on file  Occupational History   Not on file  Tobacco Use   Smoking status: Never   Smokeless tobacco: Never  Vaping Use   Vaping Use: Never used  Substance and Sexual Activity   Alcohol use: No   Drug use: No   Sexual activity: Not Currently  Other Topics Concern   Not on file  Social History Narrative   Not on file   Social Determinants of Health   Financial Resource Strain: Not on file  Food Insecurity: No Food Insecurity (07/19/2022)   Hunger Vital Sign    Worried About Running Out of Food in the Last Year: Never true    Ran Out of Food in the  Last Year: Never true  Transportation Needs: No Transportation Needs (07/19/2022)   PRAPARE - Hydrologist (Medical): No    Lack of Transportation (Non-Medical): No  Physical Activity: Sufficiently Active (12/23/2020)   Exercise Vital Sign    Days of Exercise per Week: 7 days    Minutes of Exercise per Session: 120 min  Stress: Not on file  Social Connections: Not on file  Family History:  The patient's family history includes Colon cancer in his brother; Heart attack in his mother.  ROS:   12-point review of systems is negative unless otherwise noted in the HPI.   EKGs/Labs/Other Studies Reviewed:    Studies reviewed were summarized above. The additional studies were reviewed today:  LHC 08/11/2021:   LPAV lesion is 100% stenosed.   Prox LAD to Mid LAD lesion is 95% stenosed.   Mid Cx to Dist Cx lesion is 95% stenosed.   2nd Mrg lesion is 60% stenosed.   A drug-eluting stent was successfully placed using a SYNERGY XD 2.50X48.   Post intervention, there is a 0% residual stenosis.   Successful IVUS guided angioplasty and drug-eluting stent placement to the left circumflex using 1 long stent.  Significant vessel size mismatch between the distal and the proximal area.  Distal segment was 2.5 mm in diameter in the proximal area was 3.5 mm in diameter.  The stent was postdilated distally with a 2.75 noncompliant balloon and proximally to 3.5 noncompliant balloon to the maximal stent ID which is 3.5 mm. OM2 has borderline disease proximally but is supplied by good flow from the Y graft from OM 3.   Recommendations: Continue dual antiplatelet therapy for at least 6 months. Aggressive treatment of risk factors. Given underlying chronic kidney disease, will hydrate overnight and recheck renal function in the morning. __________   [facility] 07/26/2021:   LPAV lesion is 100% stenosed.   Prox Cx to Mid Cx lesion is 90% stenosed.   Prox LAD to Mid LAD lesion is 95%  stenosed.   1st Mrg lesion is 70% stenosed.   Origin to Prox Graft lesion is 100% stenosed.   Prox RCA to Mid RCA lesion is 100% stenosed.   Origin to Prox Graft lesion is 100% stenosed.   Mid LAD to Dist LAD lesion is 80% stenosed.   Dist LAD lesion is 60% stenosed.   SVG graft was visualized by angiography.   LIMA and is normal in caliber.   The graft exhibits no disease.   The left ventricular systolic function is normal.   LV end diastolic pressure is mildly elevated.   The left ventricular ejection fraction is 55-65% by visual estimate.   1.  Significant underlying three-vessel coronary artery disease with patent LIMA to mid LAD and occluded SVG to distal LAD and SVG to OM1/OM 2.  RCA was not injected and known to be small nondominant and occluded in the midsegment. 2.  Normal LV systolic function mildly elevated left ventricular end-diastolic pressure.   Recommendations: Unfortunately, the LAD is significantly diseased after the LIMA anastomosis and likely not suitable for PCI given extension of disease to the apical LAD.  Left circumflex can be treated with PCI but at the bifurcation lesion will long segment of disease.  We will place the patient on clopidogrel and planned staged PCI at [facility]. __________   Carlton Adam MPI 06/2021:   Abnormal, probably low risk pharmacologic myocardial perfusion stress test.   There is a small in size, moderate to severe, partially reversible defect involving the apical inferior and mid inferolateral segments most consistent with scar and peri-infarct ischemia, though an element of artifact cannot be excluded.   Left ventricular function is normal (LVEF 60-65%).   Attenuation correction CT demonstrates post CABG findings, coronary artery calcification, and aortic atherosclerosis.   Compared to the prior study of 12/07/2018, mid/apical inferior/inferolateral defect is new. __________   2D echo 09/2020: 1. Left  ventricular ejection fraction, by  estimation, is 60 to 65%. The  left ventricle has normal function. The left ventricle has no regional  wall motion abnormalities. Left ventricular diastolic parameters are  consistent with Grade I diastolic  dysfunction (impaired relaxation).   2. Right ventricular systolic function is normal. The right ventricular  size is normal. There is normal pulmonary artery systolic pressure. The  estimated right ventricular systolic pressure is 0000000 mmHg.   3. Left atrial size was mildly dilated.   4. The mitral valve is normal in structure. Mild mitral valve  regurgitation.   5. The aortic valve is normal in structure. Aortic valve regurgitation is  mild.  __________   Carlton Adam MPI 11/2018: Pharmacological myocardial perfusion imaging study with no significant  ischemia Normal wall motion, EF estimated at 66% No EKG changes concerning for ischemia at peak stress or in recovery. Nonspecific T wave ABN on resting EKG V3 to V6 Low risk scan   EKG:  EKG is ordered today.  The EKG ordered today demonstrates NSR, 60 bpm, nonspecific st/t changes consistent with prior tracings  Recent Labs: 11/04/2021: B Natriuretic Peptide 180.0 07/07/2022: BUN 18; Creatinine, Ser 1.28; Hemoglobin 14.6; Platelets 194; Potassium 3.8; Sodium 139  Recent Lipid Panel    Component Value Date/Time   CHOL 90 05/04/2021 0401   CHOL 111 04/28/2021 0823   TRIG 56 05/04/2021 0401   HDL 19 (L) 05/04/2021 0401   HDL 35 (L) 04/28/2021 0823   CHOLHDL 4.7 05/04/2021 0401   VLDL 11 05/04/2021 0401   LDLCALC 60 05/04/2021 0401   LDLCALC 60 04/28/2021 0823    PHYSICAL EXAM:    VS:  BP 118/60 (BP Location: Left Arm, Patient Position: Sitting, Cuff Size: Large)   Pulse 65   Ht 6' (1.829 m)   Wt 225 lb 6.4 oz (102.2 kg)   SpO2 98%   BMI 30.57 kg/m   BMI: Body mass index is 30.57 kg/m.  Physical Exam Vitals reviewed.  Constitutional:      Appearance: He is well-developed.  HENT:     Head: Normocephalic and  atraumatic.  Eyes:     General:        Right eye: No discharge.        Left eye: No discharge.  Neck:     Vascular: No JVD.  Cardiovascular:     Rate and Rhythm: Normal rate and regular rhythm.     Pulses:          Posterior tibial pulses are 2+ on the right side and 2+ on the left side.     Heart sounds: Normal heart sounds, S1 normal and S2 normal. Heart sounds not distant. No midsystolic click and no opening snap. No murmur heard.    No friction rub.  Pulmonary:     Effort: Pulmonary effort is normal. No respiratory distress.     Breath sounds: Normal breath sounds. No decreased breath sounds, wheezing or rales.  Chest:     Chest wall: No tenderness.  Abdominal:     General: There is no distension.  Musculoskeletal:     Cervical back: Normal range of motion.     Right lower leg: No edema.     Left lower leg: No edema.  Skin:    General: Skin is warm and dry.     Nails: There is no clubbing.  Neurological:     Mental Status: He is alert and oriented to person, place, and time.  Psychiatric:  Speech: Speech normal.        Behavior: Behavior normal.        Thought Content: Thought content normal.        Judgment: Judgment normal.     Wt Readings from Last 3 Encounters:  09/19/22 225 lb 6.4 oz (102.2 kg)  07/19/22 223 lb (101.2 kg)  07/06/22 219 lb 14.4 oz (99.7 kg)     ASSESSMENT & PLAN:   CAD status post CABG status post PCI without angina: He is doing well and is without symptoms of angina or cardiac decompensation.  He remains on DAPT with aspirin and clopidogrel long-term as long as tolerated given prior CABG with subsequent PCI.  Continue aggressive risk factor modification and secondary prevention including carvedilol, atorvastatin, and Imdur.  No indication for further ischemic testing at this time.  HFpEF: He is euvolemic and well compensated with NYHA class II symptoms.  Continue current medical therapy including carvedilol, furosemide, Entresto.  Defer  addition of MRA or SGLT2 inhibitor given prior history of AKI.  HTN: Blood pressure is well-controlled in the office today.  Continue medical therapy as outlined above.  HLD: LDL 60 in 04/2021.  He remains on atorvastatin 40 mg.  We will obtain a lipid panel from PCP.  Preoperative cardiac risk stratification.  Patient indicates he will be needing to undergo repeat ectropion surgery.  He may proceed with noncardiac surgery at an overall low risk without further cardiac testing.  He may hold clopidogrel for 5 days prior to procedure.  Would recommend he be maintained on aspirin 81 mg daily throughout the perioperative timeframe as long as this is felt to be safe from a surgical perspective.  Resume clopidogrel at the discretion of his surgeon as soon as safely possible.   Disposition: F/u with Dr. Fletcher Anon or an APP in 6 months.   Medication Adjustments/Labs and Tests Ordered: Current medicines are reviewed at length with the patient today.  Concerns regarding medicines are outlined above. Medication changes, Labs and Tests ordered today are summarized above and listed in the Patient Instructions accessible in Encounters.   Signed, Christell Faith, PA-C 09/19/2022 3:06 PM     [address], [place] [phone]

## 2022-10-05 ENCOUNTER — Telehealth: Payer: Self-pay | Admitting: Cardiovascular Disease

## 2022-10-05 NOTE — Telephone Encounter (Signed)
Pt called inquiring as to when he should hold plavix and ASA.  Per last office visit on 09/19/22, Eula Listen PA recommended the following.   Preoperative cardiac risk stratification. Patient indicates he will be needing to undergo repeat ectropion surgery. He may proceed with noncardiac surgery at an overall low risk without further cardiac testing. He may hold clopidogrel for 5 days prior to procedure. Would recommend he be maintained on aspirin 81 mg daily throughout the perioperative timeframe as long as this is felt to be safe from a surgical perspective. Resume clopidogrel at the discretion of his surgeon as soon as safely possible.   Pt made aware of recommendations and verbalized understanding.

## 2022-10-05 NOTE — Telephone Encounter (Signed)
Patient wants to know when he should stop his clopidogrel (PLAVIX) 75 MG tablet and aspirin 81 MG chewable tablet prior to his surgery next Thursday (4/18).

## 2022-11-11 ENCOUNTER — Other Ambulatory Visit: Payer: Self-pay

## 2022-11-11 ENCOUNTER — Other Ambulatory Visit: Payer: Self-pay | Admitting: *Deleted

## 2022-11-11 MED ORDER — ENTRESTO 24-26 MG PO TABS: 1.0000 | ORAL_TABLET | Freq: Two times a day (BID) | ORAL | 0 refills | Status: DC

## 2022-11-15 ENCOUNTER — Other Ambulatory Visit: Payer: Self-pay | Admitting: Physician Assistant

## 2022-11-24 ENCOUNTER — Encounter: Payer: Self-pay | Admitting: Ophthalmology

## 2022-11-24 NOTE — Anesthesia Preprocedure Evaluation (Addendum)
Anesthesia Evaluation  Patient identified by MRN, date of birth, ID band Patient awake    Reviewed: Allergy & Precautions, H&P , NPO status , Patient's Chart, lab work & pertinent test results  Airway Mallampati: III  TM Distance: >3 FB Neck ROM: Full    Dental no notable dental hx. (+) Partial Upper   Pulmonary neg pulmonary ROS   Pulmonary exam normal breath sounds clear to auscultation       Cardiovascular hypertension, + angina  + CAD and +CHF  negative cardio ROS Normal cardiovascular exam+ Valvular Problems/Murmurs MR  Rhythm:Regular Rate:Normal  Echo 10-15-20 EF 60-65%, grade I diastolic dysfunction, LA mildly dilated, mild MR, mild AR, mild TR  CAD has been stented   Neuro/Psych negative neurological ROS  negative psych ROS   GI/Hepatic negative GI ROS, Neg liver ROS,GERD  ,,  Endo/Other  negative endocrine ROS    Renal/GU Renal diseasenegative Renal ROS  negative genitourinary   Musculoskeletal negative musculoskeletal ROS (+) Arthritis ,    Abdominal   Peds negative pediatric ROS (+)  Hematology negative hematology ROS (+)   Anesthesia Other Findings Essential hypertension  High cholesterol Osteoarthritis  CAD (coronary artery disease) Wears dentures  (HFpEF) heart failure with preserved ejection fraction (HCC) GERD (gastroesophageal reflux disease)Cardiac murmur Left carotid bruit  Anginal pain (HCC) Aortic atherosclerosis (HCC)     Reproductive/Obstetrics negative OB ROS                              Anesthesia Physical Anesthesia Plan  ASA: 3  Anesthesia Plan: MAC   Post-op Pain Management:    Induction: Intravenous  PONV Risk Score and Plan:   Airway Management Planned: Natural Airway and Nasal Cannula  Additional Equipment:   Intra-op Plan:   Post-operative Plan:   Informed Consent: I have reviewed the patients History and Physical, chart, labs and  discussed the procedure including the risks, benefits and alternatives for the proposed anesthesia with the patient or authorized representative who has indicated his/her understanding and acceptance.     Dental Advisory Given  Plan Discussed with: Anesthesiologist, CRNA and Surgeon  Anesthesia Plan Comments: (Patient consented for risks of anesthesia including but not limited to:  - adverse reactions to medications - damage to eyes, teeth, lips or other oral mucosa - nerve damage due to positioning  - sore throat or hoarseness - Damage to heart, brain, nerves, lungs, other parts of body or loss of life  Patient voiced understanding.)         Anesthesia Quick Evaluation

## 2022-11-28 NOTE — Discharge Instructions (Signed)

## 2022-11-30 ENCOUNTER — Ambulatory Visit
Admission: RE | Admit: 2022-11-30 | Discharge: 2022-11-30 | Disposition: A | Discharge status: Home / Self Care | Payer: Medicare PPO | Attending: Ophthalmology | Admitting: Ophthalmology

## 2022-11-30 ENCOUNTER — Ambulatory Visit: Payer: Medicare PPO | Admitting: Anesthesiology

## 2022-11-30 ENCOUNTER — Other Ambulatory Visit: Payer: Self-pay

## 2022-11-30 ENCOUNTER — Encounter
Admission: RE | Disposition: A | Discharge status: Home / Self Care | Payer: Self-pay | Source: Home / Self Care | Attending: Ophthalmology

## 2022-11-30 ENCOUNTER — Encounter: Payer: Self-pay | Admitting: Ophthalmology

## 2022-11-30 DIAGNOSIS — I2510 Atherosclerotic heart disease of native coronary artery without angina pectoris: Secondary | ICD-10-CM | POA: Diagnosis not present

## 2022-11-30 DIAGNOSIS — I110 Hypertensive heart disease with heart failure: Secondary | ICD-10-CM | POA: Diagnosis not present

## 2022-11-30 DIAGNOSIS — I5032 Chronic diastolic (congestive) heart failure: Secondary | ICD-10-CM | POA: Diagnosis not present

## 2022-11-30 DIAGNOSIS — Z8249 Family history of ischemic heart disease and other diseases of the circulatory system: Secondary | ICD-10-CM | POA: Diagnosis not present

## 2022-11-30 DIAGNOSIS — H2511 Age-related nuclear cataract, right eye: Secondary | ICD-10-CM | POA: Insufficient documentation

## 2022-11-30 HISTORY — PX: CATARACT EXTRACTION W/PHACO: SHX586

## 2022-11-30 SURGERY — PHACOEMULSIFICATION, CATARACT, WITH IOL INSERTION
Anesthesia: Monitor Anesthesia Care | Site: Eye | Laterality: Right

## 2022-11-30 MED ORDER — NEOMYCIN-POLYMYXIN-DEXAMETH 3.5-10000-0.1 OP OINT
TOPICAL_OINTMENT | OPHTHALMIC | Status: DC | PRN
  Administered 2022-11-30: 1 via OPHTHALMIC

## 2022-11-30 MED ORDER — LACTATED RINGERS IV SOLN: INTRAVENOUS | Status: DC

## 2022-11-30 MED ORDER — FENTANYL CITRATE (PF) 100 MCG/2ML IJ SOLN
INTRAMUSCULAR | Status: DC | PRN
  Administered 2022-11-30: 50 ug via INTRAVENOUS

## 2022-11-30 MED ORDER — SIGHTPATH DOSE#1 NA HYALUR & NA CHOND-NA HYALUR IO KIT
PACK | INTRAOCULAR | Status: DC | PRN
  Administered 2022-11-30: 1 via OPHTHALMIC

## 2022-11-30 MED ORDER — CEFUROXIME OPHTHALMIC INJECTION 1 MG/0.1 ML
INJECTION | OPHTHALMIC | Status: DC | PRN
  Administered 2022-11-30: .1 mL via INTRACAMERAL

## 2022-11-30 MED ORDER — ARMC OPHTHALMIC DILATING DROPS
1.0000 | OPHTHALMIC | Status: DC | PRN
  Administered 2022-11-30 (×3): 1 via OPHTHALMIC

## 2022-11-30 MED ORDER — MIDAZOLAM HCL 2 MG/2ML IJ SOLN
INTRAMUSCULAR | Status: DC | PRN
  Administered 2022-11-30 (×2): 1 mg via INTRAVENOUS

## 2022-11-30 MED ORDER — TETRACAINE HCL 0.5 % OP SOLN
1.0000 [drp] | OPHTHALMIC | Status: DC | PRN
  Administered 2022-11-30 (×3): 1 [drp] via OPHTHALMIC

## 2022-11-30 MED ORDER — SIGHTPATH DOSE#1 BSS IO SOLN
INTRAOCULAR | Status: DC | PRN
  Administered 2022-11-30: 1 mL

## 2022-11-30 MED ORDER — SIGHTPATH DOSE#1 BSS IO SOLN
INTRAOCULAR | Status: DC | PRN
  Administered 2022-11-30: 15 mL

## 2022-11-30 MED ORDER — SIGHTPATH DOSE#1 BSS IO SOLN
INTRAOCULAR | Status: DC | PRN
  Administered 2022-11-30: 63 mL via OPHTHALMIC

## 2022-11-30 MED ORDER — BRIMONIDINE TARTRATE-TIMOLOL 0.2-0.5 % OP SOLN
OPHTHALMIC | Status: DC | PRN
  Administered 2022-11-30: 1 [drp] via OPHTHALMIC

## 2022-11-30 SURGICAL SUPPLY — 18 items
CANNULA ANT/CHMB 27G (MISCELLANEOUS) IMPLANT
CANNULA ANT/CHMB 27GA (MISCELLANEOUS) IMPLANT
CATARACT SUITE SIGHTPATH (MISCELLANEOUS) ×1 IMPLANT
FEE CATARACT SUITE SIGHTPATH (MISCELLANEOUS) ×1 IMPLANT
GLOVE SRG 8 PF TXTR STRL LF DI (GLOVE) ×1 IMPLANT
GLOVE SURG ENC TEXT LTX SZ7.5 (GLOVE) ×1 IMPLANT
GLOVE SURG GAMMEX PI TX LF 7.5 (GLOVE) IMPLANT
GLOVE SURG UNDER POLY LF SZ8 (GLOVE) ×1
LENS IOL TECNIS EYHANCE 18.5 (Intraocular Lens) IMPLANT
NDL FILTER BLUNT 18X1 1/2 (NEEDLE) ×1 IMPLANT
NDL RETROBULBAR .5 NSTRL (NEEDLE) IMPLANT
NEEDLE FILTER BLUNT 18X1 1/2 (NEEDLE) ×1 IMPLANT
PACK VIT ANT 23G (MISCELLANEOUS) IMPLANT
RING MALYGIN 7.0 (MISCELLANEOUS) IMPLANT
SUT ETHILON 10-0 CS-B-6CS-B-6 (SUTURE)
SUT VICRYL 9 0 (SUTURE) IMPLANT
SUTURE EHLN 10-0 CS-B-6CS-B-6 (SUTURE) IMPLANT
SYR 3ML LL SCALE MARK (SYRINGE) ×1 IMPLANT

## 2022-11-30 NOTE — Anesthesia Postprocedure Evaluation (Signed)
Anesthesia Post Note  Patient: Dale Cook.  Procedure(s) Performed: CATARACT EXTRACTION PHACO AND INTRAOCULAR LENS PLACEMENT (IOC) RIGHT (Right: Eye)  Patient location during evaluation: PACU Anesthesia Type: MAC Level of consciousness: awake and alert Pain management: pain level controlled Vital Signs Assessment: post-procedure vital signs reviewed and stable Respiratory status: spontaneous breathing, nonlabored ventilation, respiratory function stable and patient connected to nasal cannula oxygen Cardiovascular status: stable and blood pressure returned to baseline Postop Assessment: no apparent nausea or vomiting Anesthetic complications: no   No notable events documented.   Last Vitals:  Vitals:   11/30/22 1056 11/30/22 1102  BP: 129/70 135/73  Pulse: 62 63  Resp: 20 20  Temp: 36.6 C 36.6 C  SpO2: 98% 98%    Last Pain:  Vitals:   11/30/22 1102  TempSrc:   PainSc: 0-No pain                 Brexlee Heberlein C Inman Fettig

## 2022-11-30 NOTE — Transfer of Care (Signed)
Immediate Anesthesia Transfer of Care Note  Patient: Dale Cook.  Procedure(s) Performed: CATARACT EXTRACTION PHACO AND INTRAOCULAR LENS PLACEMENT (IOC) RIGHT (Right: Eye)  Patient Location: PACU  Anesthesia Type: MAC  Level of Consciousness: awake, alert  and patient cooperative  Airway and Oxygen Therapy: Patient Spontanous Breathing and Patient connected to supplemental oxygen  Post-op Assessment: Post-op Vital signs reviewed, Patient's Cardiovascular Status Stable, Respiratory Function Stable, Patent Airway and No signs of Nausea or vomiting  Post-op Vital Signs: Reviewed and stable  Complications: No notable events documented.

## 2022-11-30 NOTE — Op Note (Signed)
  LOCATION:  [facility]   PREOPERATIVE DIAGNOSIS:    Nuclear sclerotic cataract right eye. H25.11   POSTOPERATIVE DIAGNOSIS:  Nuclear sclerotic cataract right eye.     PROCEDURE:  Phacoemusification with posterior chamber intraocular lens placement of the right eye   ULTRASOUND TIME: Procedure(s) with comments: CATARACT EXTRACTION PHACO AND INTRAOCULAR LENS PLACEMENT (IOC) RIGHT (Right) - 4.74 0:33.4  LENS:   Implant Name Type Inv. Item Serial No. Manufacturer Lot No. LRB No. Used Action  LENS IOL TECNIS EYHANCE 18.5 - [id number] Intraocular Lens LENS IOL TECNIS EYHANCE 18.5 [id number] [employer]  Right 1 Implanted         SURGEON:  Deirdre Evener, MD   ANESTHESIA:  Topical with tetracaine drops and 2% Xylocaine jelly, augmented with 1% preservative-free intracameral lidocaine.    COMPLICATIONS:  None.   DESCRIPTION OF PROCEDURE:  The patient was identified in the holding room and transported to the operating room and placed in the supine position under the operating microscope.  The right eye was identified as the operative eye and it was prepped and draped in the usual sterile ophthalmic fashion.   A 1 millimeter clear-corneal paracentesis was made at the 12:00 position.  0.5 ml of preservative-free 1% lidocaine was injected into the anterior chamber. The anterior chamber was filled with Viscoat viscoelastic.  A 2.4 millimeter keratome was used to make a near-clear corneal incision at the 9:00 position.  A curvilinear capsulorrhexis was made with a cystotome and capsulorrhexis forceps.  Balanced salt solution was used to hydrodissect and hydrodelineate the nucleus.   Phacoemulsification was then used in stop and chop fashion to remove the lens nucleus and epinucleus.  The remaining cortex was then removed using the irrigation and aspiration handpiece. Provisc was then placed into the capsular bag to distend it for lens placement.  A lens was then injected into the  capsular bag.  The remaining viscoelastic was aspirated.   Wounds were hydrated with balanced salt solution.  The anterior chamber was inflated to a physiologic pressure with balanced salt solution.  No wound leaks were noted. Cefuroxime 0.1 ml of a 10mg /ml solution was injected into the anterior chamber for a dose of 1 mg of intracameral antibiotic at the completion of the case.   Timolol and Brimonidine drops were applied to the eye.  The patient was taken to the recovery room in stable condition without complications of anesthesia or surgery.   Erwin Nishiyama 11/30/2022, 10:55 AM

## 2022-11-30 NOTE — H&P (Signed)
[facility]   Primary Care Physician:  [name], MD Ophthalmologist: Dr. Lockie Mola  Pre-Procedure History & Physical: HPI:  Dale Cook. is a 77 y.o. male here for ophthalmic surgery.   Past Medical History:  Diagnosis Date   (HFpEF) heart failure with preserved ejection fraction (HCC)    a.) TTE 01/11/2017: EF 65-70%; b.) TTE 10/15/2020: EF 60-65%. mild LAE, mild MR/AR, G1DD   Anginal pain (HCC)    Aortic atherosclerosis (HCC)    CAD (coronary artery disease)    a.) LHC 01/09/17: LAD 95p, 51m, 60/90d, LCx 80p/m, 158m, OM1 54m, OM2 90, RCA 99p; b.) 12/2016 CABG x 4 (LIMA-LAD, VG-dLAD, VG-OM1-OM2); c.) 11/2018 MV: EF 66%, no isch; d.) LHC/PCI 07/26/21: 100 LPAV, 90 p-mLCx (2.5 x 28 mm Synergy XD DES), 85 p-mLAD, 70 OM1, 80 m-dLAD, 50 dLAD; LIMA-LAD pat, SVG-dLAD and SVG-OM1/2 occ; e.) MV 07/13/2021: EF 60-65%, sm mod-sev part rev apical inf and mid inflat defect   Cardiac murmur    a.) grade II/VI early peaking systolic in aortic area   Essential hypertension    GERD (gastroesophageal reflux disease)    High cholesterol    Left carotid bruit    Osteoarthritis    Wears dentures    partial upper    Past Surgical History:  Procedure Laterality Date   CATARACT EXTRACTION W/PHACO Left 06/12/2020   Procedure: CATARACT EXTRACTION PHACO AND INTRAOCULAR LENS PLACEMENT (IOC) LEFT 4.36 00:51.9 8.4%;  Surgeon: Lockie Mola, MD;  Location: [facility];  Service: Ophthalmology;  Laterality: Left;   COLONOSCOPY WITH PROPOFOL N/A 11/09/2016   Procedure: COLONOSCOPY WITH PROPOFOL;  Surgeon: Earline Mayotte, MD;  Location: [facility] ENDOSCOPY;  Service: Endoscopy;  Laterality: N/A;   COLONOSCOPY WITH PROPOFOL N/A 11/29/2019   Procedure: COLONOSCOPY WITH PROPOFOL;  Surgeon: Earline Mayotte, MD;  Location: [facility] ENDOSCOPY;  Service: Endoscopy;  Laterality: N/A;   CORONARY ARTERY BYPASS GRAFT N/A 01/12/2017   Procedure: CORONARY ARTERY BYPASS GRAFTING x 4  (LIMA to mLAD, SVG to dLAD, sequential SVG to OM1-OM2, EVH via left thigh and leg); Surgeon: Purcell Nails, MD;  Location: [facility] OR;  Service: Open Heart Surgery;  Laterality: N/A;   CORONARY STENT INTERVENTION N/A 08/11/2021   Procedure: CORONARY STENT INTERVENTION;  Surgeon: Iran Ouch, MD;  Location: [facility];  Service: Cardiovascular;  Laterality: N/A;   CORONARY ULTRASOUND/IVUS N/A 08/11/2021   Procedure: Intravascular Ultrasound/IVUS;  Surgeon: Iran Ouch, MD;  Location: [facility];  Service: Cardiovascular;  Laterality: N/A;   ECTROPION REPAIR Bilateral 06/01/2022   INTRAOPERATIVE TRANSESOPHAGEAL ECHOCARDIOGRAM N/A 01/12/2017   Procedure: INTRAOPERATIVE TRANSESOPHAGEAL ECHOCARDIOGRAM;  Surgeon: Purcell Nails, MD;  Location: [facility] OR;  Service: Open Heart Surgery;  Laterality: N/A;   LEFT HEART CATH AND CORONARY ANGIOGRAPHY Left 01/09/2017   Procedure: Left Heart Cath and Coronary Angiography;  Surgeon: Iran Ouch, MD;  Location: [facility] INVASIVE CV LAB;  Service: Cardiovascular;  Laterality: Left;   LEFT HEART CATH AND CORS/GRAFTS ANGIOGRAPHY N/A 07/26/2021   Procedure: LEFT HEART CATH AND CORS/GRAFTS ANGIOGRAPHY;  Surgeon: Iran Ouch, MD;  Location: [facility] INVAS[facility] CV LAB;  Service: Cardiovascular;  Laterality: N/A;   TOTAL KNEE ARTHROPLASTY Right 01/18/2021   Procedure: TOTAL KNEE ARTHROPLASTY;  Surgeon: Lyndle Herrlich, MD;  Location: [facility] ORS;  Service: Orthopedics;  Laterality: Right;   TOTAL KNEE ARTHROPLASTY Left 07/19/2022   Procedure: TOTAL KNEE ARTHROPLASTY;  Surgeon: Lyndle Herrlich, MD;  Location: [facility] ORS;  Service: Orthopedics;  Laterality: Left;    Prior to Admission medications   Medication Sig Start Date End Date Taking? Authorizing Provider  acetaminophen (TYLENOL) 325 MG tablet Take 2 tablets (650 mg total) by mouth every 4 (four) hours as needed for headache or mild pain. 08/12/21  Yes Barrett, Joline Salt, PA-C  aspirin 81 MG  chewable tablet Chew 81 mg by mouth daily.   Yes [provider]  atorvastatin (LIPITOR) 40 MG tablet Take 40 mg by mouth daily.   Yes [provider]  carvedilol (COREG) 25 MG tablet TAKE 1 TABLET TWICE DAILY 11/16/22  Yes Iran Ouch, MD  clopidogrel (PLAVIX) 75 MG tablet TAKE 1 TABLET BY MOUTH EVERY DAY 07/11/22  Yes Iran Ouch, MD  furosemide (LASIX) 20 MG tablet Take 20 mg by mouth every other day.   Yes [provider]  isosorbide mononitrate (IMDUR) 30 MG 24 hr tablet Take 1 tablet (30 mg total) by mouth daily. 09/20/21  Yes Dunn, Raymon Mutton, PA-C  loratadine (CLARITIN) 10 MG tablet Take 10 mg by mouth daily as needed for allergies.   Yes [provider]  Multiple Vitamin (MULTIVITAMIN) capsule Take 1 capsule by mouth daily.   Yes [provider]  neomycin-polymyxin b-dexamethasone (MAXITROL) 3.5-10000-0.1 SUSP Place 1 drop into both eyes as needed (dry eye). 06/01/22  Yes [provider]  pantoprazole (PROTONIX) 40 MG tablet TAKE 1 TABLET EVERY DAY 04/11/22  Yes Dunn, Ryan M, PA-C  sacubitril-valsartan (ENTRESTO) 24-26 MG Take 1 tablet by mouth 2 (two) times daily. 11/11/22  Yes Sondra Barges, PA-C    Allergies as of 11/11/2022   (No Known Allergies)    Family History  Problem Relation Age of Onset   Heart attack Mother    Colon cancer Brother     Social History   Socioeconomic History   Marital status: Divorced    Spouse name: Not on file   Number of children: 3   Years of education: Not on file   Highest education level: Not on file  Occupational History   Not on file  Tobacco Use   Smoking status: Never   Smokeless tobacco: Never  Vaping Use   Vaping Use: Never used  Substance and Sexual Activity   Alcohol use: No   Drug use: No   Sexual activity: Not Currently  Other Topics Concern   Not on file  Social History Narrative   Not on file   Social Determinants of Health   Financial Resource Strain: Not  on file  Food Insecurity: No Food Insecurity (07/19/2022)   Hunger Vital Sign    Worried About Running Out of Food in the Last Year: Never true    Ran Out of Food in the Last Year: Never true  Transportation Needs: No Transportation Needs (07/19/2022)   PRAPARE - Administrator, Civil Service (Medical): No    Lack of Transportation (Non-Medical): No  Physical Activity: Sufficiently Active (12/23/2020)   Exercise Vital Sign    Days of Exercise per Week: 7 days    Minutes of Exercise per Session: 120 min  Stress: Not on file  Social Connections: Not on file  Intimate Partner Violence: Not At Risk (07/19/2022)   Humiliation, Afraid, Rape, and Kick questionnaire    Fear of Current or Ex-Partner: No    Emotionally Abused: No    Physically Abused: No    Sexually Abused: No    Review of Systems: See HPI, otherwise  negative ROS  Physical Exam: BP (!) 142/75   Pulse 60   Temp (!) 97.5 F (36.4 C) (Temporal)   Resp 14   Ht 6' (1.829 m)   Wt 103.7 kg   SpO2 98%   BMI 31.02 kg/m  General:   Alert,  pleasant and cooperative in NAD Head:  Normocephalic and atraumatic. Lungs:  Clear to auscultation.    Heart:  Regular rate and rhythm.   Impression/Plan: Dale Cook. is here for ophthalmic surgery.  Risks, benefits, limitations, and alternatives regarding ophthalmic surgery have been reviewed with the patient.  Questions have been answered.  All parties agreeable.   Lockie Mola, MD  11/30/2022, 9:38 AM

## 2022-12-01 ENCOUNTER — Encounter: Payer: Self-pay | Admitting: Ophthalmology

## 2022-12-12 ENCOUNTER — Encounter: Payer: Self-pay | Admitting: Emergency Medicine

## 2022-12-12 ENCOUNTER — Ambulatory Visit
Admission: EM | Admit: 2022-12-12 | Discharge: 2022-12-12 | Disposition: A | Discharge status: Home / Self Care | Payer: Medicare PPO | Attending: Urgent Care | Admitting: Urgent Care

## 2022-12-12 ENCOUNTER — Other Ambulatory Visit: Payer: Self-pay

## 2022-12-12 DIAGNOSIS — J069 Acute upper respiratory infection, unspecified: Secondary | ICD-10-CM

## 2022-12-12 MED ORDER — IPRATROPIUM BROMIDE 0.06 % NA SOLN
2.0000 | Freq: Four times a day (QID) | NASAL | 0 refills | Status: AC
Start: 2022-12-12 — End: ?

## 2022-12-12 NOTE — ED Triage Notes (Signed)
Symptoms started Friday.  Patient has been taking coricidin cough medicine.  Patient has a cough, runny nose.patient is coughing up clear phlegm

## 2022-12-12 NOTE — ED Provider Notes (Signed)
Dale Cook    CSN: [id number] Arrival date & time: 12/12/22  0802      History   Chief Complaint Chief Complaint  Patient presents with   Cough    HPI Dale Griesmer. is a 78 y.o. male.    Cough   Presents to urgent care with symptoms x 3 days.  Endorses cough and runny nose.  Cough is productive of clear sputum.  Taking Coricidin cough medication to control symptoms.  PMH significant for CAD with CABG x 4, HFpEF, AKI on CKD.  Most recent GFR = 59  Past Medical History:  Diagnosis Date   (HFpEF) heart failure with preserved ejection fraction (HCC)    a.) TTE 01/11/2017: EF 65-70%; b.) TTE 10/15/2020: EF 60-65%. mild LAE, mild MR/AR, G1DD   Anginal pain (HCC)    Aortic atherosclerosis (HCC)    CAD (coronary artery disease)    a.) LHC 01/09/17: LAD 95p, 61m, 60/90d, LCx 80p/m, 136m, OM1 18m, OM2 90, RCA 99p; b.) 12/2016 CABG x 4 (LIMA-LAD, VG-dLAD, VG-OM1-OM2); c.) 11/2018 MV: EF 66%, no isch; d.) LHC/PCI 07/26/21: 100 LPAV, 90 p-mLCx (2.5 x 28 mm Synergy XD DES), 85 p-mLAD, 70 OM1, 80 m-dLAD, 50 dLAD; LIMA-LAD pat, SVG-dLAD and SVG-OM1/2 occ; e.) MV 07/13/2021: EF 60-65%, sm mod-sev part rev apical inf and mid inflat defect   Cardiac murmur    a.) grade II/VI early peaking systolic in aortic area   Essential hypertension    GERD (gastroesophageal reflux disease)    High cholesterol    Left carotid bruit    Osteoarthritis    Wears dentures    partial upper    Patient Active Problem List   Diagnosis Date Noted   S/P total knee replacement, 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/2022   Effort angina 08/11/2021   AKI (acute kidney injury) (HCC) 08/05/2021   Abnormal nuclear stress test    Chronic diastolic CHF (congestive heart failure) (HCC) 05/03/2021   CAD (coronary artery disease) 05/03/2021   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) 05/03/2021   Elevated troponin 05/03/2021   Hyperglycemia 05/03/2021   Severe sepsis (HCC) 05/03/2021   Acute  bronchitis 05/03/2021   History of total knee arthroplasty, right 01/18/2021   Chest pain 12/07/2018   Hyperlipidemia with target low density lipoprotein (LDL) cholesterol less than 70 mg/dL 69/62/9528   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   Coronary artery disease involving native coronary artery with angina pectoris (HCC) 01/09/2017   Unstable angina (HCC)    Essential hypertension    Encounter for screening colonoscopy 09/21/2016    Past Surgical History:  Procedure Laterality Date   CATARACT EXTRACTION W/PHACO Left 06/12/2020   Procedure: CATARACT EXTRACTION PHACO AND INTRAOCULAR LENS PLACEMENT (IOC) LEFT 4.36 00:51.9 8.4%;  Surgeon: Lockie Mola, MD;  Location: [facility];  Service: Ophthalmology;  Laterality: Left;   CATARACT EXTRACTION W/PHACO Right 11/30/2022   Procedure: CATARACT EXTRACTION PHACO AND INTRAOCULAR LENS PLACEMENT (IOC) RIGHT;  Surgeon: Lockie Mola, MD;  Location: [facility];  Service: Ophthalmology;  Laterality: Right;  4.74 0:33.4   COLONOSCOPY WITH PROPOFOL N/A 11/09/2016   Procedure: COLONOSCOPY WITH PROPOFOL;  Surgeon: Earline Mayotte, MD;  Location: [facility] END[facility]COPY;  Service: Endoscopy;  Laterality: N/A;   COLONOSCOPY WITH PROPOFOL N/A 11/29/2019   Procedure: COLONOSCOPY WITH PROPOFOL;  Surgeon: Earline Mayotte, MD;  Location: [facility] END[facility];  Service: Endoscopy;  Laterality: N/A;   CORONARY ARTERY BYPASS GRAFT N/A 01/12/2017   Procedure: CORONARY ARTERY BYPASS  GRAFTING x 4 (LIMA to mLAD, SVG to dLAD, sequential SVG to OM1-OM2, EVH via left thigh and leg); Surgeon: Purcell Nails, MD;  Location: [facility] OR;  Service: Open Heart Surgery;  Laterality: N/A;   CORONARY STENT INTERVENTION N/A 08/11/2021   Procedure: CORONARY STENT INTERVENTION;  Surgeon: Iran Ouch, MD;  Location: [facility];  Service: Cardiovascular;  Laterality: N/A;   CORONARY ULTRASOUND/IVUS N/A 08/11/2021   Procedure: Intravascular Ultrasound/IVUS;  Surgeon:  Iran Ouch, MD;  Location: [facility];  Service: Cardiovascular;  Laterality: N/A;   ECTROPION REPAIR Bilateral 06/01/2022   INTRAOPERATIVE TRANSESOPHAGEAL ECHOCARDIOGRAM N/A 01/12/2017   Procedure: INTRAOPERATIVE TRANSESOPHAGEAL ECHOCARDIOGRAM;  Surgeon: Purcell Nails, MD;  Location: [facility] OR;  Service: Open Heart Surgery;  Laterality: N/A;   LEFT HEART CATH AND CORONARY ANGIOGRAPHY Left 01/09/2017   Procedure: Left Heart Cath and Coronary Angiography;  Surgeon: Iran Ouch, MD;  Location: [facility] IN[facility] LAB;  Service: Cardiovascular;  Laterality: Left;   LEFT HEART CATH AND CORS/GRAFTS ANGIOGRAPHY N/A 07/26/2021   Procedure: LEFT HEART CATH AND CORS/GRAFTS ANGIOGRAPHY;  Surgeon: Iran Ouch, MD;  Location: [facility];  Service: Cardiovascular;  Laterality: N/A;   TOTAL KNEE ARTHROPLASTY Right 01/18/2021   Procedure: TOTAL KNEE ARTHROPLASTY;  Surgeon: Lyndle Herrlich, MD;  Location: [facility] ORS;  Service: Orthopedics;  Laterality: Right;   TOTAL KNEE ARTHROPLASTY Left 07/19/2022   Procedure: TOTAL KNEE ARTHROPLASTY;  Surgeon: Lyndle Herrlich, MD;  Location: [facility] ORS;  Service: Orthopedics;  Laterality: Left;       Home Medications    Prior to Admission medications   Medication Sig Start Date End Date Taking? Authorizing Provider  acetaminophen (TYLENOL) 325 MG tablet Take 2 tablets (650 mg total) by mouth every 4 (four) hours as needed for headache or mild pain. 08/12/21   Barrett, Joline Salt, PA-C  aspirin 81 MG chewable tablet Chew 81 mg by mouth daily.    [provider]  atorvastatin (LIPITOR) 40 MG tablet Take 40 mg by mouth daily.    [provider]  carvedilol (COREG) 25 MG tablet TAKE 1 TABLET TWICE DAILY 11/16/22   Iran Ouch, MD  clopidogrel (PLAVIX) 75 MG tablet TAKE 1 TABLET BY MOUTH EVERY DAY 07/11/22   Iran Ouch, MD  furosemide (LASIX) 20 MG tablet Take 20 mg by mouth every other day.    [provider]   isosorbide mononitrate (IMDUR) 30 MG 24 hr tablet Take 1 tablet (30 mg total) by mouth daily. 09/20/21   Dunn, Raymon Mutton, PA-C  loratadine (CLARITIN) 10 MG tablet Take 10 mg by mouth daily as needed for allergies.    [provider]  Multiple Vitamin (MULTIVITAMIN) capsule Take 1 capsule by mouth daily.    [provider]  neomycin-polymyxin b-dexamethasone (MAXITROL) 3.5-10000-0.1 SUSP Place 1 drop into both eyes as needed (dry eye). 06/01/22   [provider]  pantoprazole (PROTONIX) 40 MG tablet TAKE 1 TABLET EVERY DAY 04/11/22   Dunn, Raymon Mutton, PA-C  sacubitril-valsartan (ENTRESTO) 24-26 MG Take 1 tablet by mouth 2 (two) times daily. 11/11/22   Sondra Barges, PA-C    Family History Family History  Problem Relation Age of Onset   Heart attack Mother    Colon cancer Brother     Social History Social History   Tobacco Use   Smoking status: Never   Smokeless tobacco: Never  Vaping Use   Vaping Use: Never used  Substance Use Topics   Alcohol use: No   Drug use: No     Allergies   Patient has no known allergies.   Review of Systems Review of Systems  Respiratory:  Positive for cough.      Physical Exam Triage Vital Signs ED Triage Vitals [12/12/22 0812]  Enc Vitals Group     BP      Pulse      Resp      Temp      Temp src      SpO2      Weight      Height      Head Circumference      Peak Flow      Pain Score 0     Pain Loc      Pain Edu?      Excl. in GC?    No data found.  Updated Vital Signs There were no vitals taken for this visit.  Visual Acuity Right Eye Distance:   Left Eye Distance:   Bilateral Distance:    Right Eye Near:   Left Eye Near:    Bilateral Near:     Physical Exam Vitals reviewed.  Constitutional:      Appearance: Normal appearance. He is not ill-appearing.  HENT:     Right Ear: Tympanic membrane normal.     Left Ear: Tympanic membrane normal.     Nose: Congestion and rhinorrhea present.      Mouth/Throat:     Mouth: Mucous membranes are moist.     Pharynx: No oropharyngeal exudate or posterior oropharyngeal erythema.  Cardiovascular:     Rate and Rhythm: Normal rate and regular rhythm.     Pulses: Normal pulses.     Heart sounds: Normal heart sounds.  Pulmonary:     Effort: Pulmonary effort is normal.     Breath sounds: Normal breath sounds.  Skin:    General: Skin is warm and dry.  Neurological:     General: No focal deficit present.     Mental Status: He is alert and oriented to person, place, and time.  Psychiatric:        Mood and Affect: Mood normal.        Behavior: Behavior normal.      UC Treatments / Results  Labs (all labs ordered are listed, but only abnormal results are displayed) Labs Reviewed - No data to display  EKG   Radiology No results found.  Procedures Procedures (including critical care time)  Medications Ordered in UC Medications - No data to display  Initial Impression / Assessment and Plan / UC Course  I have reviewed the triage vital signs and the nursing notes.  Pertinent labs & imaging results that were available during my care of the patient were reviewed by me and considered in my medical decision making (see chart for details).   Dale Cange. is a 77 y.o. male presenting with URI symptoms. Patient is afebrile without recent antipyretics, satting well on room air. Overall is well appearing though non-toxic, well hydrated, without respiratory distress. Pulmonary exam is unremarkable.  Lungs CTAB without wheezing, rhonchi, rales. RRR.  There is nasal congestion with rhinorrhea.  No pharyngeal erythema or peritonsillar exudate present.  Reviewed relevant chart history.  Short duration of patient's symptoms is most consistent with viral etiology.  Continue to recommend supportive care and use of OTC medication for symptom control consistent with his comorbidity of hypertension.  Will prescribe ipratropium  nasal spray to  help with rhinorrhea.  Counseled patient on potential for adverse effects with medications prescribed/recommended today, ER and return-to-clinic precautions discussed, patient verbalized understanding and agreement with care plan.  Final Clinical Impressions(s) / UC Diagnoses   Final diagnoses:  None   Discharge Instructions   None    ED Prescriptions   None    PDMP not reviewed this encounter.   Charma Igo, [place] 12/12/22 [phone]

## 2022-12-12 NOTE — Discharge Instructions (Addendum)
Continue to use Coricidin brands formulated for patients with high blood pressure to control your symptoms.  In addition, I have prescribed a nasal spray to help with your runny nose.

## 2023-01-09 ENCOUNTER — Telehealth: Payer: Self-pay | Admitting: Physician Assistant

## 2023-01-09 NOTE — Telephone Encounter (Signed)
Pt came by office and dropped off [employer] patient assistance application to be completed. Form left in nurse box.

## 2023-01-19 IMAGING — US US EXTREM LOW VENOUS*R*
1 series · 14 of 24 positions shown · non-contrast
Comparison: None.

CLINICAL DATA: Pain and swelling post knee replacement surgery

EXAM:
RIGHT  LOWER EXTREMITY VENOUS DOPPLER ULTRASOUND
TECHNIQUE: Gray-scale sonography with compression, as well as color and duplex
ultrasound, were performed to evaluate the deep venous system(s)
from the level of the common femoral vein through the popliteal and
proximal calf veins.

[Series 1: us venous img lower uni right (dvt) · portal-venous · 14 of 27 slices shown]
[im 1/27]
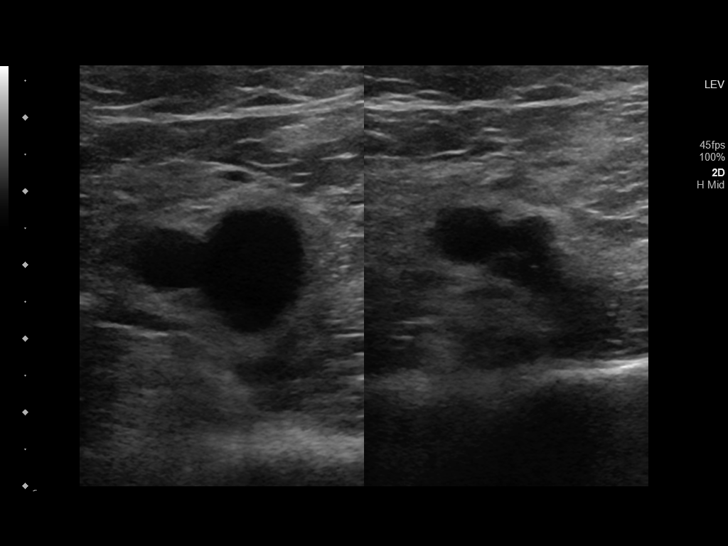
[im 3/27]
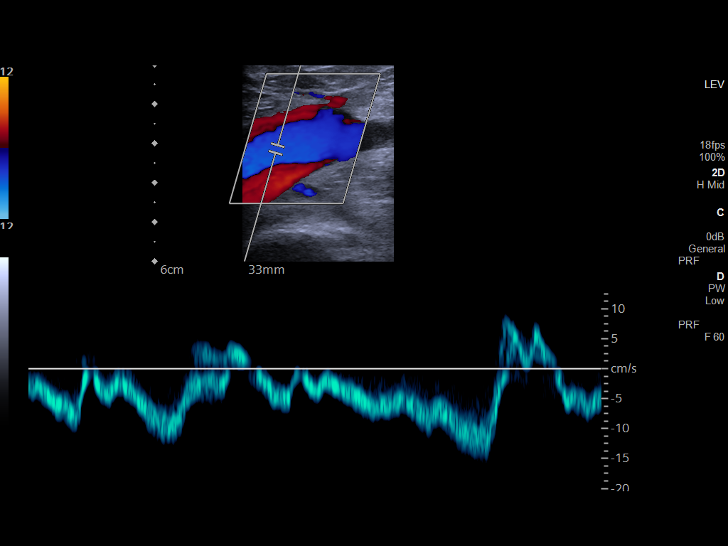
[im 5/27]
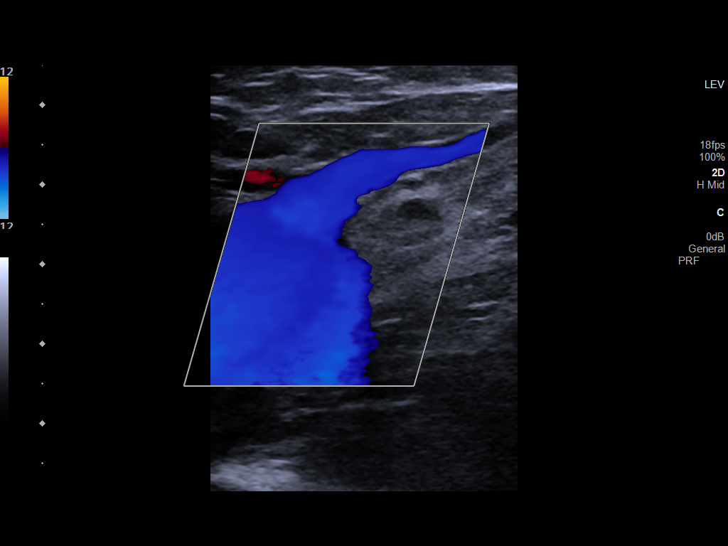
[im 7/27]
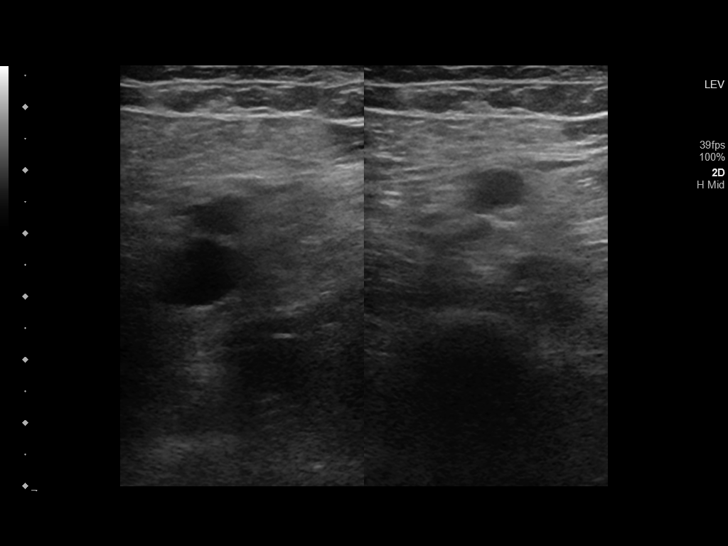
[im 8/27]
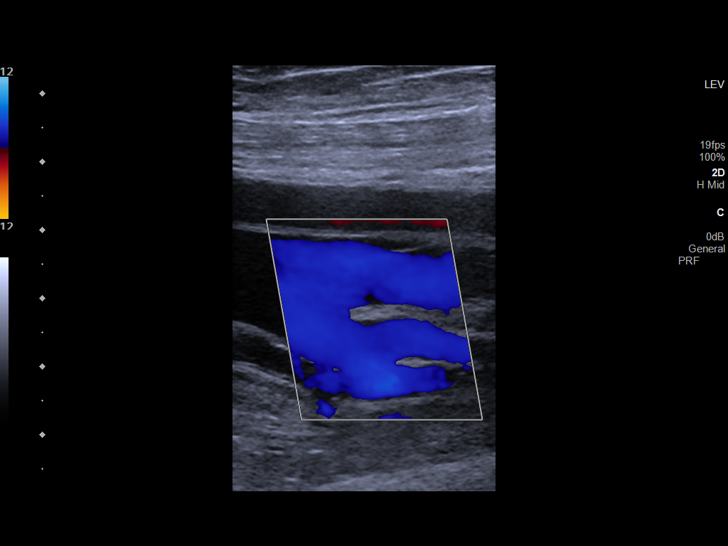
[im 11/27]
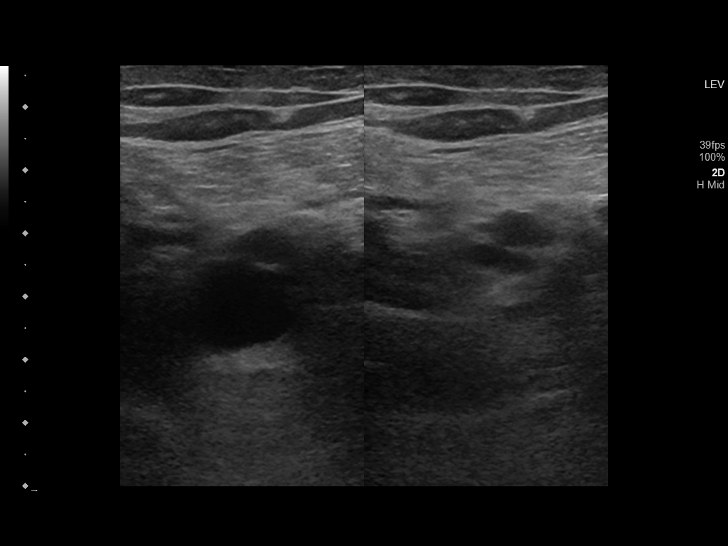
[im 13/27]
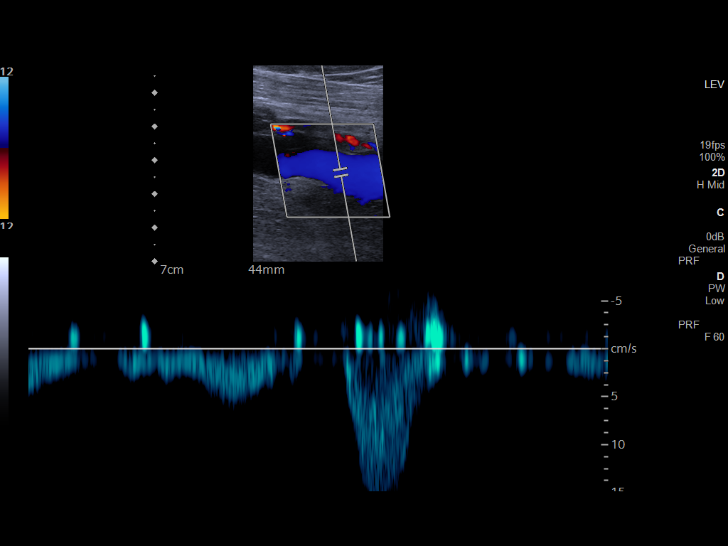
[im 14/27]
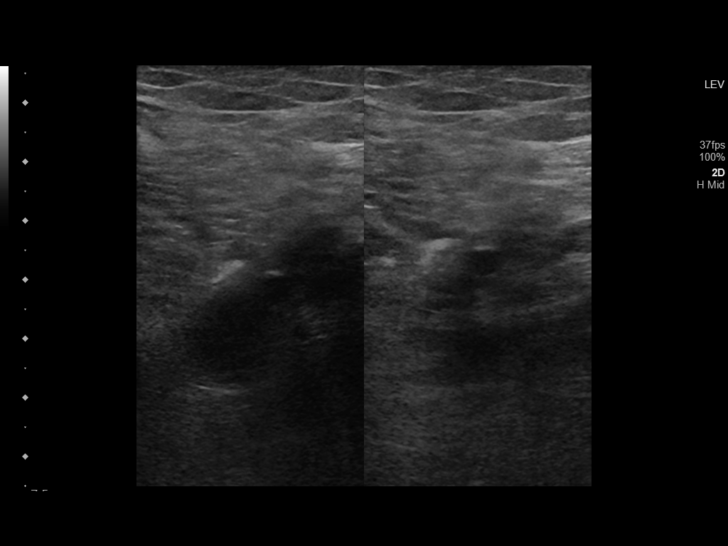
[im 16/27]
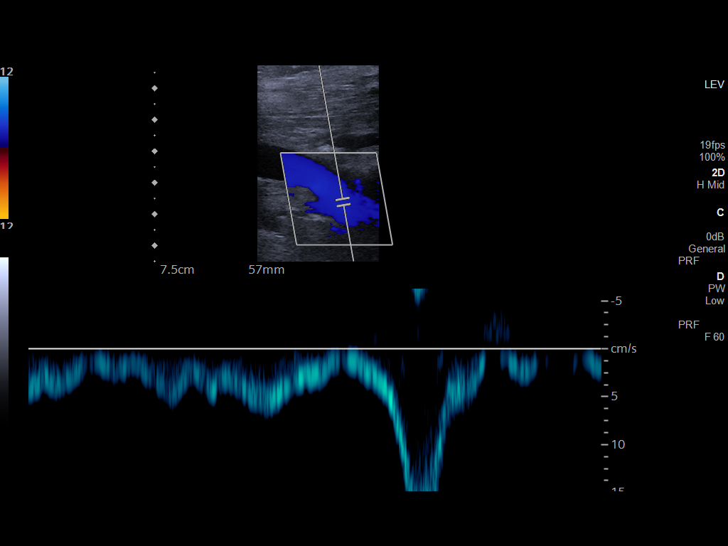
[im 19/27]
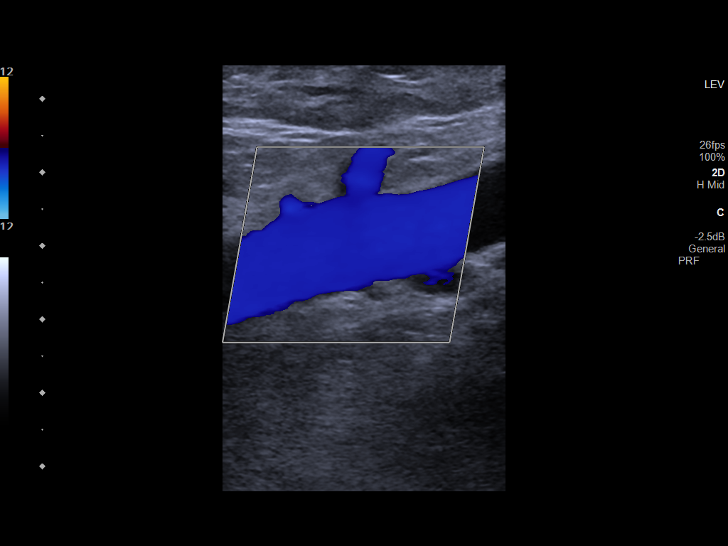
[im 21/27]
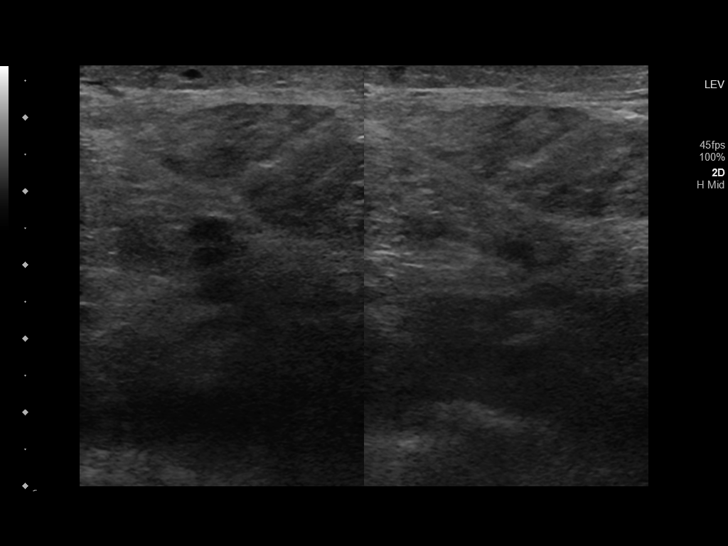
[im 22/27]
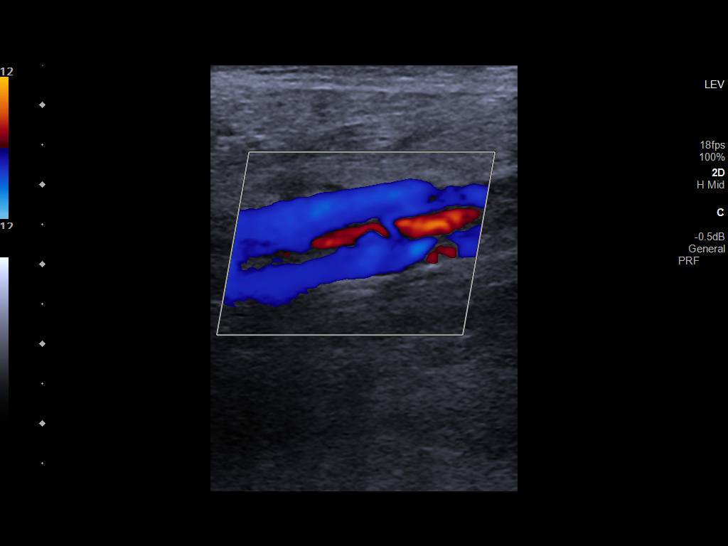
[im 24/27]
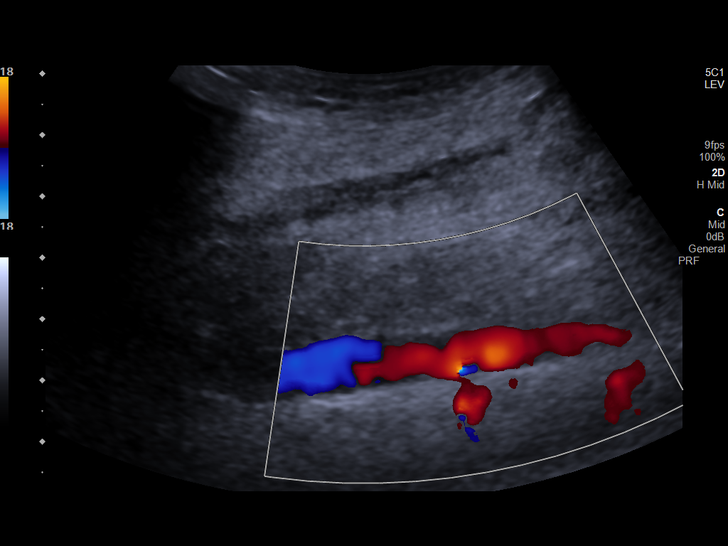
[im 27/27]
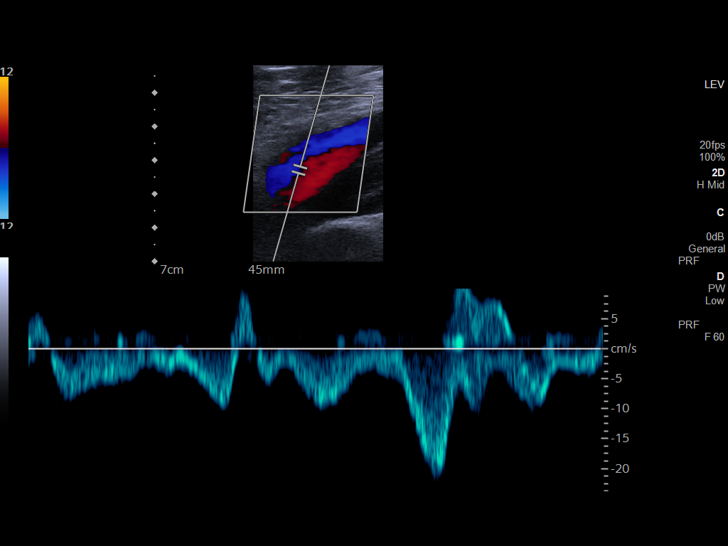

[14 of 24 positions shown; findings below may reference images not displayed]

FINDINGS: VENOUS

Normal compressibility of the common femoral, superficial femoral,
and popliteal veins, as well as the visualized calf veins.
Visualized portions of profunda femoral vein and great saphenous
vein unremarkable. No filling defects to suggest DVT on grayscale or
color Doppler imaging. Doppler waveforms show normal direction of
venous flow, normal respiratory phasicity and response to
augmentation.

Limited views of the contralateral common femoral vein are
unremarkable.

OTHER

None.

Limitations: none
IMPRESSION: No femoropopliteal DVT nor evidence of DVT within the visualized
calf veins.

If clinical symptoms are inconsistent or if there are persistent or
worsening symptoms, further imaging (possibly involving the iliac
veins) may be warranted.

## 2023-01-25 ENCOUNTER — Other Ambulatory Visit: Payer: Self-pay | Admitting: Family

## 2023-01-29 ENCOUNTER — Emergency Department: Payer: Medicare PPO

## 2023-01-29 ENCOUNTER — Observation Stay
Admission: EM | Admit: 2023-01-29 | Discharge: 2023-01-31 | Disposition: A | Discharge status: Home / Self Care | Payer: Medicare PPO | Attending: Emergency Medicine | Admitting: Emergency Medicine

## 2023-01-29 ENCOUNTER — Other Ambulatory Visit: Payer: Self-pay

## 2023-01-29 DIAGNOSIS — I25119 Atherosclerotic heart disease of native coronary artery with unspecified angina pectoris: Secondary | ICD-10-CM | POA: Diagnosis present

## 2023-01-29 DIAGNOSIS — N390 Urinary tract infection, site not specified: Secondary | ICD-10-CM | POA: Diagnosis not present

## 2023-01-29 DIAGNOSIS — E872 Acidosis, unspecified: Secondary | ICD-10-CM | POA: Insufficient documentation

## 2023-01-29 DIAGNOSIS — A419 Sepsis, unspecified organism: Secondary | ICD-10-CM

## 2023-01-29 DIAGNOSIS — R6883 Chills (without fever): Principal | ICD-10-CM

## 2023-01-29 DIAGNOSIS — Z955 Presence of coronary angioplasty implant and graft: Secondary | ICD-10-CM | POA: Diagnosis not present

## 2023-01-29 DIAGNOSIS — Z1152 Encounter for screening for COVID-19: Secondary | ICD-10-CM | POA: Insufficient documentation

## 2023-01-29 DIAGNOSIS — R8281 Pyuria: Secondary | ICD-10-CM

## 2023-01-29 DIAGNOSIS — I2510 Atherosclerotic heart disease of native coronary artery without angina pectoris: Secondary | ICD-10-CM | POA: Diagnosis present

## 2023-01-29 DIAGNOSIS — R509 Fever, unspecified: Secondary | ICD-10-CM | POA: Diagnosis present

## 2023-01-29 DIAGNOSIS — I25118 Atherosclerotic heart disease of native coronary artery with other forms of angina pectoris: Secondary | ICD-10-CM | POA: Insufficient documentation

## 2023-01-29 DIAGNOSIS — D72829 Elevated white blood cell count, unspecified: Secondary | ICD-10-CM | POA: Insufficient documentation

## 2023-01-29 DIAGNOSIS — Z7902 Long term (current) use of antithrombotics/antiplatelets: Secondary | ICD-10-CM | POA: Diagnosis not present

## 2023-01-29 DIAGNOSIS — Z79899 Other long term (current) drug therapy: Secondary | ICD-10-CM | POA: Diagnosis not present

## 2023-01-29 DIAGNOSIS — Z96653 Presence of artificial knee joint, bilateral: Secondary | ICD-10-CM | POA: Diagnosis not present

## 2023-01-29 DIAGNOSIS — N179 Acute kidney failure, unspecified: Secondary | ICD-10-CM | POA: Insufficient documentation

## 2023-01-29 DIAGNOSIS — Z7982 Long term (current) use of aspirin: Secondary | ICD-10-CM | POA: Diagnosis not present

## 2023-01-29 DIAGNOSIS — I5032 Chronic diastolic (congestive) heart failure: Secondary | ICD-10-CM | POA: Diagnosis present

## 2023-01-29 DIAGNOSIS — I130 Hypertensive heart and chronic kidney disease with heart failure and stage 1 through stage 4 chronic kidney disease, or unspecified chronic kidney disease: Secondary | ICD-10-CM | POA: Insufficient documentation

## 2023-01-29 DIAGNOSIS — N189 Chronic kidney disease, unspecified: Secondary | ICD-10-CM | POA: Diagnosis not present

## 2023-01-29 DIAGNOSIS — I10 Essential (primary) hypertension: Secondary | ICD-10-CM | POA: Diagnosis present

## 2023-01-29 LAB — CBC WITH DIFFERENTIAL/PLATELET
Abs Immature Granulocytes: 0.06 10*3/uL (ref 0.00–0.07)
Basophils Absolute: 0 10*3/uL (ref 0.0–0.1)
Basophils Relative: 0 %
Eosinophils Absolute: 0.2 10*3/uL (ref 0.0–0.5)
Eosinophils Relative: 1 %
HCT: 43.9 % (ref 39.0–52.0)
Hemoglobin: 13.9 g/dL (ref 13.0–17.0)
Immature Granulocytes: 0 %
Lymphocytes Relative: 5 %
Lymphs Abs: 0.8 10*3/uL (ref 0.7–4.0)
MCH: 26 pg (ref 26.0–34.0)
MCHC: 31.7 g/dL (ref 30.0–36.0)
MCV: 82.2 fL (ref 80.0–100.0)
Monocytes Absolute: 1.3 10*3/uL — ABNORMAL HIGH (ref 0.1–1.0)
Monocytes Relative: 8 %
Neutro Abs: 14.1 10*3/uL — ABNORMAL HIGH (ref 1.7–7.7)
Neutrophils Relative %: 86 %
Platelets: 187 10*3/uL (ref 150–400)
RBC: 5.34 MIL/uL (ref 4.22–5.81)
RDW: 15.9 % — ABNORMAL HIGH (ref 11.5–15.5)
WBC: 16.4 10*3/uL — ABNORMAL HIGH (ref 4.0–10.5)
nRBC: 0 % (ref 0.0–0.2)

## 2023-01-29 LAB — COMPREHENSIVE METABOLIC PANEL
ALT: 19 U/L (ref 0–44)
AST: 22 U/L (ref 15–41)
Albumin: 3.6 g/dL (ref 3.5–5.0)
Alkaline Phosphatase: 94 U/L (ref 38–126)
Anion gap: 8 (ref 5–15)
BUN: 17 mg/dL (ref 8–23)
CO2: 24 mmol/L (ref 22–32)
Calcium: 8.5 mg/dL — ABNORMAL LOW (ref 8.9–10.3)
Chloride: 103 mmol/L (ref 98–111)
Creatinine, Ser: 1.24 mg/dL (ref 0.61–1.24)
GFR, Estimated: >60 mL/min (ref >60)
Glucose, Bld: 163 mg/dL — ABNORMAL HIGH (ref 70–99)
Potassium: 4.2 mmol/L (ref 3.5–5.1)
Sodium: 135 mmol/L (ref 135–145)
Total Bilirubin: 0.5 mg/dL (ref 0.3–1.2)
Total Protein: 7.4 g/dL (ref 6.5–8.1)

## 2023-01-29 LAB — SEDIMENTATION RATE: Sed Rate: 19 mm/hr (ref 0–20)

## 2023-01-29 LAB — CBC
HCT: 34.7 % — ABNORMAL LOW (ref 39.0–52.0)
Hemoglobin: 11 g/dL — ABNORMAL LOW (ref 13.0–17.0)
MCH: 26.1 pg (ref 26.0–34.0)
MCHC: 31.7 g/dL (ref 30.0–36.0)
MCV: 82.4 fL (ref 80.0–100.0)
Platelets: 140 10*3/uL — ABNORMAL LOW (ref 150–400)
RBC: 4.21 MIL/uL — ABNORMAL LOW (ref 4.22–5.81)
RDW: 15.9 % — ABNORMAL HIGH (ref 11.5–15.5)
WBC: 20 10*3/uL — ABNORMAL HIGH (ref 4.0–10.5)
nRBC: 0 % (ref 0.0–0.2)

## 2023-01-29 LAB — SARS CORONAVIRUS 2 BY RT PCR: SARS Coronavirus 2 by RT PCR: NEGATIVE

## 2023-01-29 LAB — URINALYSIS, ROUTINE W REFLEX MICROSCOPIC
Bacteria, UA: NONE SEEN
Bilirubin Urine: NEGATIVE
Glucose, UA: NEGATIVE mg/dL
Hgb urine dipstick: NEGATIVE
Ketones, ur: NEGATIVE mg/dL
Nitrite: NEGATIVE
Protein, ur: NEGATIVE mg/dL
Specific Gravity, Urine: 1.015 (ref 1.005–1.030)
WBC, UA: >50 WBC/hpf (ref 0–5)
pH: 5 (ref 5.0–8.0)

## 2023-01-29 LAB — CREATININE, SERUM
Creatinine, Ser: 1.22 mg/dL (ref 0.61–1.24)
GFR, Estimated: >60 mL/min (ref >60)

## 2023-01-29 LAB — LACTIC ACID, PLASMA
Lactic Acid, Venous: 2.6 mmol/L (ref 0.5–1.9)
Lactic Acid, Venous: 2.8 mmol/L (ref 0.5–1.9)

## 2023-01-29 LAB — PROCALCITONIN: Procalcitonin: 0.99 ng/mL

## 2023-01-29 LAB — BRAIN NATRIURETIC PEPTIDE: B Natriuretic Peptide: 215.9 pg/mL — ABNORMAL HIGH (ref 0.0–100.0)

## 2023-01-29 LAB — D-DIMER, QUANTITATIVE: D-Dimer, Quant: 0.34 ug/mL-FEU (ref 0.00–0.50)

## 2023-01-29 LAB — TROPONIN I (HIGH SENSITIVITY)
Troponin I (High Sensitivity): 9 ng/L (ref <18)
Troponin I (High Sensitivity): 12 ng/L (ref <18)
Troponin I (High Sensitivity): 7 ng/L (ref <18)

## 2023-01-29 LAB — GLUCOSE, CAPILLARY: Glucose-Capillary: 110 mg/dL — ABNORMAL HIGH (ref 70–99)

## 2023-01-29 LAB — TSH: TSH: 2.115 u[IU]/mL (ref 0.350–4.500)

## 2023-01-29 LAB — T4, FREE: Free T4: 0.85 ng/dL (ref 0.61–1.12)

## 2023-01-29 MED ORDER — SODIUM CHLORIDE 0.9 % IV BOLUS
1000.0000 mL | Freq: Once | INTRAVENOUS | Status: AC
  Administered 2023-01-29: 1000 mL via INTRAVENOUS

## 2023-01-29 MED ORDER — ACETAMINOPHEN 650 MG RE SUPP: 650.0000 mg | Freq: Four times a day (QID) | RECTAL | Status: DC | PRN

## 2023-01-29 MED ORDER — ERYTHROMYCIN 5 MG/GM OP OINT
TOPICAL_OINTMENT | Freq: Four times a day (QID) | OPHTHALMIC | Status: DC
  Administered 2023-01-29: 1 via OPHTHALMIC
  Filled 2023-01-29 (×2): qty 1

## 2023-01-29 MED ORDER — ACETAMINOPHEN 500 MG PO TABS
1000.0000 mg | ORAL_TABLET | Freq: Once | ORAL | Status: AC
  Administered 2023-01-29: 1000 mg via ORAL
  Filled 2023-01-29: qty 2

## 2023-01-29 MED ORDER — ORAL CARE MOUTH RINSE: 15.0000 mL | OROMUCOSAL | Status: DC | PRN

## 2023-01-29 MED ORDER — IOHEXOL 350 MG/ML SOLN
75.0000 mL | Freq: Once | INTRAVENOUS | Status: AC | PRN
  Administered 2023-01-29: 75 mL via INTRAVENOUS

## 2023-01-29 MED ORDER — SODIUM CHLORIDE 0.9% FLUSH
3.0000 mL | Freq: Two times a day (BID) | INTRAVENOUS | Status: DC
  Administered 2023-01-29 – 2023-01-31 (×4): 3 mL via INTRAVENOUS

## 2023-01-29 MED ORDER — HEPARIN SODIUM (PORCINE) 5000 UNIT/ML IJ SOLN
5000.0000 [IU] | Freq: Three times a day (TID) | INTRAMUSCULAR | Status: DC
  Administered 2023-01-29 – 2023-01-31 (×5): 5000 [IU] via SUBCUTANEOUS
  Filled 2023-01-29 (×5): qty 1

## 2023-01-29 MED ORDER — INSULIN ASPART 100 UNIT/ML IJ SOLN
0.0000 [IU] | INTRAMUSCULAR | Status: DC | PRN
  Administered 2023-01-30: 1 [IU] via SUBCUTANEOUS
  Filled 2023-01-29: qty 1

## 2023-01-29 MED ORDER — LACTATED RINGERS IV SOLN: INTRAVENOUS | Status: AC

## 2023-01-29 MED ORDER — MORPHINE SULFATE (PF) 2 MG/ML IV SOLN
2.0000 mg | INTRAVENOUS | Status: DC | PRN
  Filled 2023-01-29: qty 1

## 2023-01-29 MED ORDER — SODIUM CHLORIDE 0.9 % IV SOLN
2.0000 g | INTRAVENOUS | Status: DC
  Administered 2023-01-29 – 2023-01-30 (×2): 2 g via INTRAVENOUS
  Filled 2023-01-29 (×4): qty 20

## 2023-01-29 MED ORDER — ACETAMINOPHEN 325 MG PO TABS
650.0000 mg | ORAL_TABLET | Freq: Four times a day (QID) | ORAL | Status: DC | PRN
  Filled 2023-01-29: qty 2

## 2023-01-29 NOTE — ED Notes (Signed)
Pt to floor.

## 2023-01-29 NOTE — Assessment & Plan Note (Addendum)
PRN tylenol. Continue rocephin follow cultures.

## 2023-01-29 NOTE — Code Documentation (Signed)
CODE SEPSIS - PHARMACY COMMUNICATION  **Broad Spectrum Antibiotics should be administered within 1 hour of Sepsis diagnosis**  Time Code Sepsis Called/Page Received: 7425  Antibiotics Ordered: ceftriaxone  Time of 1st antibiotic administration: 2116  Additional action taken by pharmacy: N/A  Barrie Folk ,PharmD Clinical Pharmacist  01/29/2023  8:30 PM

## 2023-01-29 NOTE — Assessment & Plan Note (Signed)
Patient status post stent currently on aspirin 81 Plavix 75, Imdur, atorvastatin 40, as needed Lasix, Imdur we will continue the same.

## 2023-01-29 NOTE — ED Notes (Signed)
No dyspnea noted. Patient spoke easily and in complete sentences. Patient given urinal per request.

## 2023-01-29 NOTE — ED Triage Notes (Signed)
Pt states he drank cold water this morning and got chills, reports mild shob. Pt AOX4, NAD noted. Respirations even, unlabored, lung sounds clear.

## 2023-01-29 NOTE — ED Notes (Signed)
Pt care taken, pt is hungry and ready to go home, PA notified.

## 2023-01-29 NOTE — Assessment & Plan Note (Addendum)
Minimally elevated lactic acid at 2.6 with no evidence of acidosis on CMP.  Nonspecific, similar to his elevated WBC count.   No indication for treatment with no other symptoms reported. ? If this is infectious culture obtained in ed and we will follow.  LR at 75 cc/hour/ 1 day./ since urine was suspeccted source pt did get started on rocephin in ed and we will continue.

## 2023-01-29 NOTE — Sepsis Progress Note (Signed)
Elink following for sepsis protocol. 

## 2023-01-29 NOTE — ED Notes (Signed)
Critical lactic reported to JVM 2.6

## 2023-01-29 NOTE — Assessment & Plan Note (Signed)
Patient meets sepsis criteria secondary to suspected urinary tract infection patient has leukocyte Estrace and urine will follow cultures and continue Rocephin started in the emergency room.

## 2023-01-29 NOTE — ED Provider Notes (Addendum)
[facility] Emergency Department Provider Note     Event Date/Time   First MD Initiated Contact with Patient 01/29/23 1146     (approximate)   History   Chills   HPI  Dale Cook. is a 77 y.o. male with a history of HTN, CHF, HLD, CAD, GERD, & OA resents to the ED for evaluation of chills that he got this morning.  Patient also endorsed mild shortness of breath.  He describes symptom onset after he drank some cold water this morning.  Patient presents to the ED after he reported to church for service, and churchgoer's reported that he looked ill.     Physical Exam   Triage Vital Signs: ED Triage Vitals  Encounter Vitals Group     BP 01/29/23 1136 127/82     Systolic BP Percentile --      Diastolic BP Percentile --      Pulse Rate 01/29/23 1136 96     Resp 01/29/23 1136 18     Temp 01/29/23 1136 99.5 F (37.5 C)     Temp Source 01/29/23 1136 Oral     SpO2 01/29/23 1136 96 %     Weight --      Height --      Head Circumference --      Peak Flow --      Pain Score 01/29/23 1137 2     Pain Loc --      Pain Education --      Exclude from Growth Chart --     Most recent vital signs: Vitals:   01/29/23 1136 01/29/23 1916  BP: 127/82 (!) 139/54  Pulse: 96 94  Resp: 18 18  Temp: 99.5 F (37.5 C) (!) 102 F (38.9 C)  SpO2: 96% 97%    General Awake, no distress. NAD HEENT NCAT. PERRL. EOMI. No rhinorrhea. Mucous membranes are moist.  CV:  Good peripheral perfusion. RRR RESP:  Normal effort. CTA ABD:  No distention. Soft, nontender   ED Results / Procedures / Treatments   Labs (all labs ordered are listed, but only abnormal results are displayed) Labs Reviewed  COMPREHENSIVE METABOLIC PANEL - Abnormal; Notable for the following components:      Result Value   Glucose, Bld 163 (*)    Calcium 8.5 (*)    All other components within normal limits  CBC WITH DIFFERENTIAL/PLATELET - Abnormal; Notable for the following  components:   WBC 16.4 (*)    RDW 15.9 (*)    Neutro Abs 14.1 (*)    Monocytes Absolute 1.3 (*)    All other components within normal limits  BRAIN NATRIURETIC PEPTIDE - Abnormal; Notable for the following components:   B Natriuretic Peptide 215.9 (*)    All other components within normal limits  URINALYSIS, ROUTINE W REFLEX MICROSCOPIC - Abnormal; Notable for the following components:   Color, Urine YELLOW (*)    APPearance HAZY (*)    Leukocytes,Ua MODERATE (*)    All other components within normal limits  LACTIC ACID, PLASMA - Abnormal; Notable for the following components:   Lactic Acid, Venous 2.6 (*)    All other components within normal limits  LACTIC ACID, PLASMA - Abnormal; Notable for the following components:   Lactic Acid, Venous 2.8 (*)    All other components within normal limits  SARS CORONAVIRUS 2 BY RT PCR  CULTURE, BLOOD (ROUTINE X 2)  CULTURE, BLOOD (ROUTINE X 2)  URINE CULTURE  TROPONIN I (HIGH SENSITIVITY)    EKG   RADIOLOGY  I personally viewed and evaluated these images as part of my medical decision making, as well as reviewing the written report by the radiologist.  ED Provider Interpretation: no acute findings  DG Chest 2 View  Result Date: 01/29/2023 CLINICAL DATA:  Shortness of breath. EXAM: CHEST - 2 VIEW COMPARISON:  06/08/2022 FINDINGS: The lungs are clear without focal pneumonia, edema, pneumothorax or pleural effusion. The cardiopericardial silhouette is within normal limits for size. No acute bony abnormality. Telemetry leads overlie the chest. IMPRESSION: No active cardiopulmonary disease. Electronically Signed   By: Kennith Center M.D.   On: 01/29/2023 12:28     PROCEDURES:  Critical Care performed: YES  Procedures CRITICAL CARE Performed by: Lissa Hoard   Total critical care time: 30 minutes  Critical care time was exclusive of separately billable procedures and treating other patients.  Critical care was necessary to  treat or prevent imminent or life-threatening deterioration.  Critical care was time spent personally by me on the following activities: development of treatment plan with patient and/or surrogate as well as nursing, discussions with consultants, evaluation of patient's response to treatment, examination of patient, obtaining history from patient or surrogate, ordering and performing treatments and interventions, ordering and review of laboratory studies, ordering and review of radiographic studies, pulse oximetry and re-evaluation of patient's condition.   MEDICATIONS ORDERED IN ED: Medications  erythromycin ophthalmic ointment (1 Application Right Eye Given 01/29/23 1736)  cefTRIAXone (ROCEPHIN) 2 g in sodium chloride 0.9 % 100 mL IVPB (has no administration in time range)  sodium chloride 0.9 % bolus 1,000 mL (0 mLs Intravenous Stopped 01/29/23 1902)  acetaminophen (TYLENOL) tablet 1,000 mg (1,000 mg Oral Given 01/29/23 1924)     IMPRESSION / MDM / ASSESSMENT AND PLAN / ED COURSE  I reviewed the triage vital signs and the nursing notes.                              Differential diagnosis includes, but is not limited to, COVID, viral URI, electrolyte abnormality, anemia  Patient's presentation is most consistent with acute complicated illness / injury requiring diagnostic workup.  Patient's diagnosis is consistent with abscess with an unknown source of infection, but concerning for possible pyuria.  Presents to the ED with complaints of chills and to be afebrile on presentation without complaints of cough, congestion, abdominal pain, dysuria.  CBC reveals an elevated white count of 16.5 with a left shift.  No other electrolyte abnormalities noted.  Lactic acid was initially elevated at 2.6, and trended up to 2.8 after fluid bolus.  Blood cultures are pending at this time as her urine cultures.  UA did reveal greater than 50 WBCs without bacteria.  Patient with an oral temp elevation to 102 F  after IV fluid bolus.  Patient will be treated empirically on Rocephin and admitted to the hospital service for ongoing evaluation management of presumed sepsis with a GU source.  Patient is agreeable to the plan at this time.    FINAL CLINICAL IMPRESSION(S) / ED DIAGNOSES   Final diagnoses:  Chills  Sepsis without acute organ dysfunction, due to unspecified organism (HCC)  Pyuria     Rx / DC Orders   ED Discharge Orders     None        Note:  This document was prepared using Dragon voice recognition software and may include  unintentional dictation errors.    Lissa Hoard, PA-C 01/29/23 2037    Lissa Hoard, PA-C 01/29/23 2038    Sharyn Creamer, MD 01/29/23 2115

## 2023-01-29 NOTE — Consult Note (Signed)
Initial Consultation Note   Patient: Dale Cook. ION:[id number] DOB: [birth year] PCP: [name], MD DOA: 01/29/2023 DOS: the patient was seen and examined on 01/29/2023 Primary service: No att. providers found  Referring physician: Purvis Sheffield, PA Reason for consult: Leukocytosis with lactic acidosis  Assessment/Plan: Assessment and Plan:  Leukocytosis Patient is presenting with chills only and no other localizing symptoms found to have an elevated WBC at 16.4.  Workup has largely been negative including chest x-ray and urinalysis.  Although there are leukocytes on the urinalysis, with lack of any urinary symptoms, there is no indication for treatment.  At this point, patient feels well and there is no indication to treat.  - Discussed with primary team, who is in agreement with plan - Discussed return precautions with Dale Cook and encouraged him to reach out to his PCP this week for repeat blood work.  Should he develop any localizing symptoms, recommended he contact his PCP or return to the ED - No indication for hospitalization at this time  Lactic acidosis Minimally elevated lactic acid at 2.6 with no evidence of acidosis on CMP.  Nonspecific, similar to his elevated WBC count.  No indication for treatment with no other symptoms reported.   [facility] will sign off at present, please call us again when needed.  HPI: Dale Cook. is a 77 y.o. male with past medical history of CAD s/p CABG (2018), HFpEF with last EF of 60-65%, hypertension, hyperlipidemia, cataracts, who presents to the ED due to chills.  [name] states that he was in his usual state of health this morning when he drank a bottle of cold water and developed full body chills.  Due to this, he came to the ED for evaluation.  Otherwise, he denies any symptoms at this time including headache, dizziness, chest pain, palpitations, shortness of breath, nausea, vomiting, diarrhea, abdominal pain, dysuria,  urinary urgency or frequency, lower extremity edema, rash or recent injuries or joint pain.  He notes that he occasionally does have a cough that is due to his allergies but is not very bothersome at this time.  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  He denies any vision changes.  ED course: On arrival to the ED, patient was normotensive at 127/82 with heart rate of 96.  He was saturating at 96% on room air.  He was afebrile at 99.5. Initial workup notable for WBC of 16.4, glucose of 163, creatinine of 1.24 with GFR above 60.  BNP 215 with negative troponin.  Lactic acid 2.6.  Urinalysis with moderate leukocytes but no bacteria.  Chest x-ray with no active cardiopulmonary disease.  COVID-19 PCR negative.  Due to leukocytosis with lactic acidosis, [facility] contacted for consideration of admission.  Review of Systems: As mentioned in the history of present illness. All other systems reviewed and are negative.  Past Medical History:  Diagnosis Date   (HFpEF) heart failure with preserved ejection fraction (HCC)    a.) TTE 01/11/2017: EF 65-70%; b.) TTE 10/15/2020: EF 60-65%. mild LAE, mild MR/AR, G1DD   Anginal pain (HCC)    Aortic atherosclerosis (HCC)    CAD (coronary artery disease)    a.) LHC 01/09/17: LAD 95p, 56m, 60/90d, LCx 80p/m, 154m, OM1 37m, OM2 90, RCA 99p; b.) 12/2016 CABG x 4 (LIMA-LAD, VG-dLAD, VG-OM1-OM2); c.) 11/2018 MV: EF 66%, no isch; d.) LHC/PCI 07/26/21: 100 LPAV,  90 p-mLCx (2.5 x 28 mm Synergy XD DES), 85 p-mLAD, 70 OM1, 80 m-dLAD, 50 dLAD; LIMA-LAD pat, SVG-dLAD and SVG-OM1/2 occ; e.) MV 07/13/2021: EF 60-65%, sm mod-sev part rev apical inf and mid inflat defect   Cardiac murmur    a.) grade II/VI early peaking systolic in aortic area   Essential hypertension    GERD (gastroesophageal reflux disease)    High cholesterol    Left carotid bruit    Osteoarthritis    Wears dentures    partial upper    Past Surgical History:  Procedure Laterality Date   CATARACT EXTRACTION W/PHACO Left 06/12/2020   Procedure: CATARACT EXTRACTION PHACO AND INTRAOCULAR LENS PLACEMENT (IOC) LEFT 4.36 00:51.9 8.4%;  Surgeon: Lockie Mola, MD;  Location: [facility];  Service: Ophthalmology;  Laterality: Left;   CATARACT EXTRACTION W/PHACO Right 11/30/2022   Procedure: CATARACT EXTRACTION PHACO AND INTRAOCULAR LENS PLACEMENT (IOC) RIGHT;  Surgeon: Lockie Mola, MD;  Location: [facility];  Service: Ophthalmology;  Laterality: Right;  4.74 0:33.4   COLONOSCOPY WITH PROPOFOL N/A 11/09/2016   Procedure: COLONOSCOPY WITH PROPOFOL;  Surgeon: Earline Mayotte, MD;  Location: [facility];  Service: Endoscopy;  Laterality: N/A;   COLONOSCOPY WITH PROPOFOL N/A 11/29/2019   Procedure: COLONOSCOPY WITH PROPOFOL;  Surgeon: Earline Mayotte, MD;  Location: [facility];  Service: Endoscopy;  Laterality: N/A;   CORONARY ARTERY BYPASS GRAFT N/A 01/12/2017   Procedure: CORONARY ARTERY BYPASS GRAFTING x 4 (LIMA to mLAD, SVG to dLAD, sequential SVG to OM1-OM2, EVH via left thigh and leg); Surgeon: Purcell Nails, MD;  Location: [facility] OR;  Service: Open Heart Surgery;  Laterality: N/A;   CORONARY STENT INTERVENTION N/A 08/11/2021   Procedure: CORONARY STENT INTERVENTION;  Surgeon: Iran Ouch, MD;  Location: [facility];  Service: Cardiovascular;  Laterality: N/A;   CORONARY ULTRASOUND/IVUS N/A 08/11/2021   Procedure: Intravascular Ultrasound/IVUS;  Surgeon: Iran Ouch, MD;  Location: [facility];  Service: Cardiovascular;  Laterality: N/A;   ECTROPION REPAIR Bilateral 06/01/2022   INTRAOPERATIVE TRANSESOPHAGEAL ECHOCARDIOGRAM N/A 01/12/2017   Procedure: INTRAOPERATIVE TRANSESOPHAGEAL ECHOCARDIOGRAM;  Surgeon: Purcell Nails, MD;  Location: [facility] OR;  Service: Open Heart Surgery;  Laterality: N/A;   LEFT HEART CATH AND CORONARY ANGIOGRAPHY Left 01/09/2017    Procedure: Left Heart Cath and Coronary Angiography;  Surgeon: Iran Ouch, MD;  Location: [facility];  Service: Cardiovascular;  Laterality: Left;   LEFT HEART CATH AND CORS/GRAFTS ANGIOGRAPHY N/A 07/26/2021   Procedure: LEFT HEART CATH AND CORS/GRAFTS ANGIOGRAPHY;  Surgeon: Iran Ouch, MD;  Location: [facility];  Service: Cardiovascular;  Laterality: N/A;   TOTAL KNEE ARTHROPLASTY Right 01/18/2021   Procedure: TOTAL KNEE ARTHROPLASTY;  Surgeon: Lyndle Herrlich, MD;  Location: [facility] OR[facility];  Service: Orthopedics;  Laterality: Right;   TOTAL KNEE ARTHROPLASTY Left 07/19/2022   Procedure: TOTAL KNEE ARTHROPLASTY;  Surgeon: Lyndle Herrlich, MD;  Location: [facility];  Service: Orthopedics;  Laterality: Left;   Social History:  reports that he has never smoked. He has never used smokeless tobacco. He reports that he does not drink alcohol and does not use drugs.  No Known Allergies  Family History  Problem Relation Age of Onset   Heart attack Mother    Colon cancer Brother     Prior to Admission medications   Medication Sig Start Date End Date Taking? Authorizing Provider  acetaminophen (TYLENOL) 325 MG tablet Take 2 tablets (650 mg total) by  mouth every 4 (four) hours as needed for headache or mild pain. 08/12/21   Barrett, Joline Salt, PA-C  aspirin 81 MG chewable tablet Chew 81 mg by mouth daily.    [provider]  atorvastatin (LIPITOR) 40 MG tablet Take 40 mg by mouth daily.    [provider]  carvedilol (COREG) 25 MG tablet TAKE 1 TABLET TWICE DAILY 11/16/22   Iran Ouch, MD  clopidogrel (PLAVIX) 75 MG tablet TAKE 1 TABLET BY MOUTH EVERY DAY 07/11/22   Iran Ouch, MD  furosemide (LASIX) 20 MG tablet Take 20 mg by mouth every other day.    [provider]  ipratropium (ATROVENT) 0.06 % nasal spray Place 2 sprays into both nostrils 4 (four) times daily. 12/12/22   Immordino, Jeannett Senior, FNP  isosorbide mononitrate (IMDUR) 30 MG 24  hr tablet Take 1 tablet (30 mg total) by mouth daily. 09/20/21   Dunn, Raymon Mutton, PA-C  loratadine (CLARITIN) 10 MG tablet Take 10 mg by mouth daily as needed for allergies.    [provider]  Multiple Vitamin (MULTIVITAMIN) capsule Take 1 capsule by mouth daily.    [provider]  neomycin-polymyxin b-dexamethasone (MAXITROL) 3.5-10000-0.1 SUSP Place 1 drop into both eyes as needed (dry eye). 06/01/22   [provider]  pantoprazole (PROTONIX) 40 MG tablet TAKE 1 TABLET EVERY DAY 04/11/22   Dunn, Raymon Mutton, PA-C  sacubitril-valsartan (ENTRESTO) 24-26 MG Take 1 tablet by mouth 2 (two) times daily. 11/11/22   Sondra Barges, PA-C    Physical Exam: Vitals:   01/29/23 1136 01/29/23 1148  BP: 127/82   Pulse: 96   Resp: 18   Temp: 99.5 F (37.5 C)   TempSrc: Oral   SpO2: 96%   Weight:  103.7 kg  Height:  6' (1.829 m)   Physical Exam Vitals and nursing note reviewed.  Constitutional:      General: He is not in acute distress.    Appearance: He is obese.  HENT:     Head: Normocephalic and atraumatic.     Mouth/Throat:     Mouth: Mucous membranes are moist.     Pharynx: Oropharynx is clear.  Eyes:     General:        Right eye: Discharge (Small amount of drainage noted) present.     Extraocular Movements: Extraocular movements intact.     Conjunctiva/sclera: Conjunctivae normal.  Cardiovascular:     Rate and Rhythm: Normal rate and regular rhythm.  Pulmonary:     Effort: Pulmonary effort is normal. No respiratory distress.     Breath sounds: Normal breath sounds. No wheezing, rhonchi or rales.  Abdominal:     General: Abdomen is protuberant. Bowel sounds are normal.     Palpations: Abdomen is soft.     Tenderness: There is no abdominal tenderness. There is no guarding.     Hernia: No hernia is present.  Musculoskeletal:     Right lower leg: No edema.     Left lower leg: No edema.  Skin:    General: Skin is warm and dry.     Findings: No erythema or rash.   Neurological:     Mental Status: He is alert and oriented to person, place, and time. Mental status is at baseline.  Psychiatric:        Mood and Affect: Mood normal.        Behavior: Behavior normal.    Data Reviewed:  CBC with WBC of 16.4, hemoglobin  13.9, platelets of 187 CMP with sodium of 135, potassium 4.2, bicarb 24, glucose 163, BUN 17, creatinine 1.24, calcium 8.5, AST 22, ALT 19 and GFR above 60 BNP 215 comparable to prior readings Troponin negative at 9 Lactic acid mildly elevated 2.6 Urinalysis with leukocytes and WBCs but no nitrites or bacteria seen. COVID-19 PCR negative  EKG personally reviewed.Sinus rhythm with rate of 96.  Nonspecific T wave abnormalities, however no evidence of acute ischemic changes.  DG Chest 2 View  Result Date: 01/29/2023 CLINICAL DATA:  Shortness of breath. EXAM: CHEST - 2 VIEW COMPARISON:  06/08/2022 FINDINGS: The lungs are clear without focal pneumonia, edema, pneumothorax or pleural effusion. The cardiopericardial silhouette is within normal limits for size. No acute bony abnormality. Telemetry leads overlie the chest. IMPRESSION: No active cardiopulmonary disease. Electronically Signed   By: Kennith Center M.D.   On: 01/29/2023 12:28    Family Communication: No family at bedside. Primary team communication: Discussed with primary team   Thank you very much for involving [place] in the care of your patient.  Author: Verdene Lennert, MD 01/29/2023 6:04 PM  For on call review [email or web address].

## 2023-01-29 NOTE — H&P (Addendum)
History and Physical    Patient: Dale Cook. ZOX:[id number] DOB: Sep 26, 1945 DOA: 01/29/2023 DOS: the patient was seen and examined on 01/30/2023 PCP: [name], MD  [facility]: [facility]  Chief Complaint:  Chief Complaint  Patient presents with   Chills    HPI: Dale Cook. is a 77 y.o. male with medical history significant for heart disease with stents and 2023 currently patient on dual antiplatelet with aspirin and Plavix, emphysema ,GERD, hypertension, preserved ejection fraction congestive heart failure EF of 60 to 65%.  Presenting earlier today and seen as consult requesting admission for chills, patient had elevated lactic, per nurses note patient had reports shortness of breath in the morning along with his chills.  No reports of chest pain headache palpitations dizziness speech  gait issues falls nausea vomiting or diarrhea. Chart review shows patient had a cataract extraction in June 2024 and is currently follows up with ophthalmology. In ED patient is alert awake oriented, vitals show temp of 99.5 reaching 102, normal blood pressure, O2 sats 96% on room air. Labs show normal CMP with a glucose of 163.  Normal troponin, BNP of 215.9. Leukocytosis of  16.4 normal hemoglobin normal platelet count. Initial lactic acid of 2 point 6 repeat of 2.8. Respiratory panel negative for COVID. Urinalysis shows trace leukocytes and more than 50 WBCs. Patient has a history of left knee replacement.  Review of Systems: Review of Systems  Constitutional:  Positive for chills and fever.  Respiratory:  Positive for shortness of breath.   All other systems reviewed and are negative.  Past Medical History:  Diagnosis Date   (HFpEF) heart failure with preserved ejection fraction (HCC)    a.) TTE 01/11/2017: EF 65-70%; b.) TTE 10/15/2020: EF 60-65%. mild LAE, mild MR/AR, G1DD   Anginal pain (HCC)    Aortic atherosclerosis (HCC)    CAD  (coronary artery disease)    a.) LHC 01/09/17: LAD 95p, 5m, 60/90d, LCx 80p/m, 124m, OM1 38m, OM2 90, RCA 99p; b.) 12/2016 CABG x 4 (LIMA-LAD, VG-dLAD, VG-OM1-OM2); c.) 11/2018 MV: EF 66%, no isch; d.) LHC/PCI 07/26/21: 100 LPAV, 90 p-mLCx (2.5 x 28 mm Synergy XD DES), 85 p-mLAD, 70 OM1, 80 m-dLAD, 50 dLAD; LIMA-LAD pat, SVG-dLAD and SVG-OM1/2 occ; e.) MV 07/13/2021: EF 60-65%, sm mod-sev part rev apical inf and mid inflat defect   Cardiac murmur    a.) grade II/VI early peaking systolic in aortic area   Essential hypertension    GERD (gastroesophageal reflux disease)    High cholesterol    Left carotid bruit    Osteoarthritis    Wears dentures    partial upper   Past Surgical History:  Procedure Laterality Date   CATARACT EXTRACTION W/PHACO Left 06/12/2020   Procedure: CATARACT EXTRACTION PHACO AND INTRAOCULAR LENS PLACEMENT (IOC) LEFT 4.36 00:51.9 8.4%;  Surgeon: Lockie Mola, MD;  Location: [facility];  Service: Ophthalmology;  Laterality: Left;   CATARACT EXTRACTION W/PHACO Right 11/30/2022   Procedure: CATARACT EXTRACTION PHACO AND INTRAOCULAR LENS PLACEMENT (IOC) RIGHT;  Surgeon: Lockie Mola, MD;  Location: [facility];  Service: Ophthalmology;  Laterality: Right;  4.74 0:33.4   COLONOSCOPY WITH PROPOFOL N/A 11/09/2016   Procedure: COLONOSCOPY WITH PROPOFOL;  Surgeon: Earline Mayotte, MD;  Location: [facility] END[facility];  Service: Endoscopy;  Laterality: N/A;   COLONOSCOPY WITH PROPOFOL N/A 11/29/2019   Procedure: COLONOSCOPY WITH PROPOFOL;  Surgeon: Earline Mayotte, MD;  Location: [facility] ENDOSCOPY;  Service: Endoscopy;  Laterality: N/A;   CORONARY ARTERY BYPASS GRAFT N/A 01/12/2017   Procedure: CORONARY ARTERY BYPASS GRAFTING x 4 (LIMA to mLAD, SVG to dLAD, sequential SVG to OM1-OM2, EVH via left thigh and leg); Surgeon: Purcell Nails, MD;  Location: [facility] OR;  Service: Open Heart Surgery;  Laterality: N/A;   CORONARY STENT INTERVENTION N/A 08/11/2021    Procedure: CORONARY STENT INTERVENTION;  Surgeon: Iran Ouch, MD;  Location: [facility];  Service: Cardiovascular;  Laterality: N/A;   CORONARY ULTRASOUND/IVUS N/A 08/11/2021   Procedure: Intravascular Ultrasound/IVUS;  Surgeon: Iran Ouch, MD;  Location: [facility];  Service: Cardiovascular;  Laterality: N/A;   ECTROPION REPAIR Bilateral 06/01/2022   INTRAOPERATIVE TRANSESOPHAGEAL ECHOCARDIOGRAM N/A 01/12/2017   Procedure: INTRAOPERATIVE TRANSESOPHAGEAL ECHOCARDIOGRAM;  Surgeon: Purcell Nails, MD;  Location: [facility] OR;  Service: Open Heart Surgery;  Laterality: N/A;   LEFT HEART CATH AND CORONARY ANGIOGRAPHY Left 01/09/2017   Procedure: Left Heart Cath and Coronary Angiography;  Surgeon: Iran Ouch, MD;  Location: [facility];  Service: Cardiovascular;  Laterality: Left;   LEFT HEART CATH AND CORS/GRAFTS ANGIOGRAPHY N/A 07/26/2021   Procedure: LEFT HEART CATH AND CORS/GRAFTS ANGIOGRAPHY;  Surgeon: Iran Ouch, MD;  Location: [facility];  Service: Cardiovascular;  Laterality: N/A;   TOTAL KNEE ARTHROPLASTY Right 01/18/2021   Procedure: TOTAL KNEE ARTHROPLASTY;  Surgeon: Lyndle Herrlich, MD;  Location: [facility] ORS;  Service: Orthopedics;  Laterality: Right;   TOTAL KNEE ARTHROPLASTY Left 07/19/2022   Procedure: TOTAL KNEE ARTHROPLASTY;  Surgeon: Lyndle Herrlich, MD;  Location: [facility] ORS;  Service: Orthopedics;  Laterality: Left;   Social History:   reports that he has never smoked. He has never used smokeless tobacco. He reports that he does not drink alcohol and does not use drugs.  No Known Allergies  Family History  Problem Relation Age of Onset   Heart attack Mother    Colon cancer Brother     Prior to Admission medications   Medication Sig Start Date End Date Taking? Authorizing Provider  acetaminophen (TYLENOL) 325 MG tablet Take 2 tablets (650 mg total) by mouth every 4 (four) hours as needed for headache or mild pain. 08/12/21    Barrett, Joline Salt, PA-C  aspirin 81 MG chewable tablet Chew 81 mg by mouth daily.    [provider]  atorvastatin (LIPITOR) 40 MG tablet Take 40 mg by mouth daily.    [provider]  carvedilol (COREG) 25 MG tablet TAKE 1 TABLET TWICE DAILY 11/16/22   Iran Ouch, MD  clopidogrel (PLAVIX) 75 MG tablet TAKE 1 TABLET BY MOUTH EVERY DAY 07/11/22   Iran Ouch, MD  furosemide (LASIX) 20 MG tablet Take 20 mg by mouth every other day.    [provider]  ipratropium (ATROVENT) 0.06 % nasal spray Place 2 sprays into both nostrils 4 (four) times daily. 12/12/22   Immordino, Jeannett Senior, FNP  isosorbide mononitrate (IMDUR) 30 MG 24 hr tablet Take 1 tablet (30 mg total) by mouth daily. 09/20/21   Dunn, Raymon Mutton, PA-C  loratadine (CLARITIN) 10 MG tablet Take 10 mg by mouth daily as needed for allergies.    [provider]  Multiple Vitamin (MULTIVITAMIN) capsule Take 1 capsule by mouth daily.    [provider]  neomycin-polymyxin b-dexamethasone (MAXITROL) 3.5-10000-0.1 SUSP Place 1 drop into both eyes as needed (dry eye). 06/01/22   [provider]  pantoprazole (PROTONIX) 40 MG tablet TAKE 1  TABLET EVERY DAY 04/11/22   Dunn, Raymon Mutton, PA-C  sacubitril-valsartan (ENTRESTO) 24-26 MG Take 1 tablet by mouth 2 (two) times daily. 11/11/22   Sondra Barges, PA-C   Vitals:   01/29/23 2212 01/30/23 0408 01/30/23 0500 01/30/23 0718  BP: 134/68 (!) 100/53  110/68  Pulse: 87 86  81  Resp: 18 16  20   Temp: 98.4 F (36.9 C) 99.1 F (37.3 C)  99 F (37.2 C)  TempSrc: Oral Oral  Oral  SpO2: 99% 97%  96%  Weight:   104.7 kg   Height:       Physical Exam Vitals and nursing note reviewed.  Constitutional:      General: He is not in acute distress. HENT:     Head: Normocephalic and atraumatic.     Right Ear: Hearing normal.     Left Ear: Hearing normal.     Nose: Nose normal. No nasal deformity.     Mouth/Throat:     Lips: Pink.     Tongue: No lesions.      Pharynx: Oropharynx is clear.  Eyes:     General: Lids are normal.     Extraocular Movements: Extraocular movements intact.  Cardiovascular:     Rate and Rhythm: Normal rate and regular rhythm.     Heart sounds: Normal heart sounds.  Pulmonary:     Effort: Pulmonary effort is normal.     Breath sounds: Normal breath sounds.  Abdominal:     General: Bowel sounds are normal. There is no distension.     Palpations: Abdomen is soft. There is no mass.     Tenderness: There is no abdominal tenderness.  Musculoskeletal:     Right lower leg: No edema.     Left lower leg: No edema.  Skin:    General: Skin is warm.  Neurological:     General: No focal deficit present.     Mental Status: He is alert and oriented to person, place, and time.     Cranial Nerves: Cranial nerves 2-12 are intact.  Psychiatric:        Attention and Perception: Attention normal.        Mood and Affect: Mood normal.        Speech: Speech normal.        Behavior: Behavior normal. Behavior is cooperative.   Labs on Admission: I have personally reviewed following labs and imaging studies  CBC: Recent Labs  Lab 01/29/23 1146 01/29/23 2152 01/30/23 0453  WBC 16.4* 20.0* 22.6*  NEUTROABS 14.1*  --   --   HGB 13.9 11.0* 12.1*  HCT 43.9 34.7* 36.7*  MCV 82.2 82.4 79.6*  PLT 187 140* 172   Basic Metabolic Panel: Recent Labs  Lab 01/29/23 1146 01/29/23 2152 01/30/23 0453  NA 135  --  136  K 4.2  --  3.6  CL 103  --  106  CO2 24  --  23  GLUCOSE 163*  --  119*  BUN 17  --  15  CREATININE 1.24 1.22 1.32*  CALCIUM 8.5*  --  8.0*   GFR: Estimated Creatinine Clearance: 61.4 mL/min (A) (by C-G formula based on SCr of 1.32 mg/dL (H)). Liver Function Tests: Recent Labs  Lab 01/29/23 1146 01/30/23 0453  AST 22 15  ALT 19 15  ALKPHOS 94 72  BILITOT 0.5 0.8  PROT 7.4 6.0*  ALBUMIN 3.6 2.9*   Urinalysis    Component Value Date/Time   COLORURINE YELLOW (A)  01/29/2023 1244   APPEARANCEUR HAZY  (A) 01/29/2023 1244   LABSPEC 1.015 01/29/2023 1244   PHURINE 5.0 01/29/2023 1244   GLUCOSEU NEGATIVE 01/29/2023 1244   HGBUR NEGATIVE 01/29/2023 1244   BILIRUBINUR NEGATIVE 01/29/2023 1244   KETONESUR NEGATIVE 01/29/2023 1244   PROTEINUR NEGATIVE 01/29/2023 1244   NITRITE NEGATIVE 01/29/2023 1244   LEUKOCYTESUR MODERATE (A) 01/29/2023 1244   Unresulted Labs (From admission, onward)     Start     Ordered   01/29/23 2029  Urine Culture  Add-on,   AD       Question Answer Comment  Indication Sepsis   Patient immune status Normal      01/29/23 2030            Medications  erythromycin ophthalmic ointment ( Right Eye Given 01/30/23 0604)  cefTRIAXone (ROCEPHIN) 2 g in sodium chloride 0.9 % 100 mL IVPB (0 g Intravenous Stopped 01/29/23 2146)  sodium chloride flush (NS) 0.9 % injection 3 mL (3 mLs Intravenous Given 01/30/23 0850)  acetaminophen (TYLENOL) tablet 650 mg (has no administration in time range)    Or  acetaminophen (TYLENOL) suppository 650 mg (has no administration in time range)  morphine (PF) 2 MG/ML injection 2 mg (has no administration in time range)  heparin injection 5,000 Units (5,000 Units Subcutaneous Given 01/30/23 0604)  lactated ringers infusion ( Intravenous Infusion Verify 01/30/23 0444)  insulin aspart (novoLOG) injection 0-9 Units ( Subcutaneous Patient Refused/Not Given 01/30/23 0420)  Oral care mouth rinse (has no administration in time range)  sodium chloride 0.9 % bolus 1,000 mL (0 mLs Intravenous Stopped 01/29/23 1902)  acetaminophen (TYLENOL) tablet 1,000 mg (1,000 mg Oral Given 01/29/23 1924)  iohexol (OMNIPAQUE) 350 MG/ML injection 75 mL (75 mLs Intravenous Contrast Given 01/29/23 2121)    Radiological Exams on Admission: CT Angio Chest Pulmonary Embolism (PE) W or WO Contrast  Result Date: 01/29/2023 CLINICAL DATA:  Pulmonary embolism (PE) suspected, high prob EXAM: CT ANGIOGRAPHY CHEST WITH CONTRAST TECHNIQUE: Multidetector CT imaging of the chest was  performed using the standard protocol during bolus administration of intravenous contrast. Multiplanar CT image reconstructions and MIPs were obtained to evaluate the vascular anatomy. RADIATION DOSE REDUCTION: This exam was performed according to the departmental dose-optimization program which includes automated exposure control, adjustment of the mA and/or kV according to patient size and/or use of iterative reconstruction technique. CONTRAST:  75mL OMNIPAQUE IOHEXOL 350 MG/ML SOLN COMPARISON:  05/03/2021 FINDINGS: Cardiovascular: Prior CABG. Cardiomegaly. No filling defects in the pulmonary arteries to suggest pulmonary emboli. Aortic atherosclerosis. No aneurysm. Mediastinum/Nodes: No mediastinal, hilar, or axillary adenopathy. Trachea and esophagus are unremarkable. Thyroid unremarkable. Lungs/Pleura: Mild elevation of the right hemidiaphragm, stable. No confluent opacities or effusions. Upper Abdomen: No acute findings Musculoskeletal: Chest wall soft tissues are unremarkable. No acute bony abnormality. Review of the MIP images confirms the above findings. IMPRESSION: No evidence of pulmonary embolus. Cardiomegaly, prior CABG. No acute cardiopulmonary disease. Aortic Atherosclerosis (ICD10-I70.0). Electronically Signed   By: Charlett Nose M.D.   On: 01/29/2023 21:43   DG Chest 2 View  Result Date: 01/29/2023 CLINICAL DATA:  Shortness of breath. EXAM: CHEST - 2 VIEW COMPARISON:  06/08/2022 FINDINGS: The lungs are clear without focal pneumonia, edema, pneumothorax or pleural effusion. The cardiopericardial silhouette is within normal limits for size. No acute bony abnormality. Telemetry leads overlie the chest. IMPRESSION: No active cardiopulmonary disease. Electronically Signed   By: Kennith Center M.D.   On: 01/29/2023 12:28     Data  Reviewed: Relevant notes from primary care and specialist visits, past discharge summaries as available in EHR, including Care Everywhere. Prior diagnostic testing as  pertinent to current admission diagnoses Updated medications and problem lists for reconciliation ED course, including vitals, labs, imaging, treatment and response to treatment Triage notes, nursing and pharmacy notes and ED provider's notes Notable results as noted in HPI  Assessment and Plan: * Leukocytosis Patient is presenting with chills only and no other localizing symptoms found to have an elevated WBC at 16.4.  Workup has largely been negative including chest x-ray and urinalysis.  Although there are leukocytes on the urinalysis, with lack of any urinary symptoms, there is no indication for treatment.  At this point, patient feels well and there is no indication to treat.  - Discussed with primary team, who is in agreement with plan - Discussed return precautions with Mr. Bradly and encouraged him to reach out to his PCP this week for repeat blood work.  Should he develop any localizing symptoms, recommended he contact his PCP or return to the ED - No indication for hospitalization at this time The above portion was per dr. Diamond Nickel earlier consult as he developed fever prior to discharge we were reconsulted , and I have kept her a/p part of note so we can follow   Will follow-up on cultures and consider antibiotics.  Coronary artery disease involving native coronary artery with angina pectoris [facility]) Patient status post stent currently on aspirin 81 Plavix 75, Imdur, atorvastatin 40, as needed Lasix, Imdur we will continue the same.  Fever and chills PRN tylenol. Continue rocephin follow cultures.   Lactic acidosis Minimally elevated lactic acid at 2.6 with no evidence of acidosis on CMP.  Nonspecific, similar to his elevated WBC count.   No indication for treatment with no other symptoms reported. ? If this is infectious culture obtained in ed and we will follow.  LR at 75 cc/hour/ 1 day./ since urine was suspeccted source pt did get started on rocephin in ed and we will  continue.   Sepsis secondary to UTI [facility]) Patient meets sepsis criteria secondary to suspected urinary tract infection patient has leukocyte Estrace and urine will follow cultures and continue Rocephin started in the emergency room.  CAD (coronary artery disease) Continue dual antiplatelet therapy and statin therapy.  Cardiology consult as deemed appropriate.  Essential hypertension Vitals:   01/29/23 1136 01/29/23 1916  BP: 127/82 (!) 139/54  Patient on atorvastatin 40, Coreg 25, Imdur. Will hold patient's Lasix and Entresto.    DVT prophylaxis:  Heparin   Consults:  None   Advance Care Planning:    Code Status: Full Code   Family Communication:  None   Disposition Plan:  Home.   Severity of Illness: The appropriate patient status for this patient is OBSERVATION. Observation status is judged to be reasonable and necessary in order to provide the required intensity of service to ensure the patient's safety. The patient's presenting symptoms, physical exam findings, and initial radiographic and laboratory data in the context of their medical condition is felt to place them at decreased risk for further clinical deterioration. Furthermore, it is anticipated that the patient will be medically stable for discharge from the hospital within 2 midnights of admission.   Author: Gertha Calkin, MD 01/30/2023 9:59 AM  For on call review [email or web address].

## 2023-01-29 NOTE — Assessment & Plan Note (Signed)
Vitals:   01/29/23 1136 01/29/23 1916  BP: 127/82 (!) 139/54  Patient on atorvastatin 40, Coreg 25, Imdur. Will hold patient's Lasix and Entresto.

## 2023-01-29 NOTE — Assessment & Plan Note (Addendum)
Patient is presenting with chills only and no other localizing symptoms found to have an elevated WBC at 16.4.  Workup has largely been negative including chest x-ray and urinalysis.  Although there are leukocytes on the urinalysis, with lack of any urinary symptoms, there is no indication for treatment.  At this point, patient feels well and there is no indication to treat.  - Discussed with primary team, who is in agreement with plan - Discussed return precautions with Dale Cook and encouraged him to reach out to his PCP this week for repeat blood work.  Should he develop any localizing symptoms, recommended he contact his PCP or return to the ED - No indication for hospitalization at this time The above portion was per dr. Diamond Nickel earlier consult as he developed fever prior to discharge we were reconsulted , and I have kept her a/p part of note so we can follow   Will follow-up on cultures and consider antibiotics.

## 2023-01-29 NOTE — Assessment & Plan Note (Signed)
Continue dual antiplatelet therapy and statin therapy.  Cardiology consult as deemed appropriate.

## 2023-01-29 NOTE — ED Notes (Signed)
Report given to floor

## 2023-01-29 NOTE — ED Notes (Signed)
See triage note  Presents with chills and low grade temp  States he noticed after drinking some cold water

## 2023-01-30 ENCOUNTER — Encounter: Payer: Self-pay | Admitting: Internal Medicine

## 2023-01-30 ENCOUNTER — Observation Stay: Payer: Medicare PPO

## 2023-01-30 DIAGNOSIS — R509 Fever, unspecified: Secondary | ICD-10-CM | POA: Diagnosis not present

## 2023-01-30 DIAGNOSIS — A419 Sepsis, unspecified organism: Secondary | ICD-10-CM | POA: Diagnosis not present

## 2023-01-30 DIAGNOSIS — D72829 Elevated white blood cell count, unspecified: Secondary | ICD-10-CM | POA: Diagnosis not present

## 2023-01-30 LAB — LACTIC ACID, PLASMA: Lactic Acid, Venous: 1.6 mmol/L (ref 0.5–1.9)

## 2023-01-30 LAB — GLUCOSE, CAPILLARY
Glucose-Capillary: 129 mg/dL — ABNORMAL HIGH (ref 70–99)
Glucose-Capillary: 124 mg/dL — ABNORMAL HIGH (ref 70–99)
Glucose-Capillary: 136 mg/dL — ABNORMAL HIGH (ref 70–99)
Glucose-Capillary: 104 mg/dL — ABNORMAL HIGH (ref 70–99)
Glucose-Capillary: 177 mg/dL — ABNORMAL HIGH (ref 70–99)

## 2023-01-30 LAB — PROCALCITONIN: Procalcitonin: 1.97 ng/mL

## 2023-01-30 MED ORDER — IOHEXOL 300 MG/ML  SOLN
100.0000 mL | Freq: Once | INTRAMUSCULAR | Status: AC | PRN
  Administered 2023-01-30: 100 mL via INTRAVENOUS

## 2023-01-30 MED ORDER — IOHEXOL 9 MG/ML PO SOLN
500.0000 mL | Freq: Once | ORAL | Status: AC | PRN
  Administered 2023-01-30 (×2): 500 mL via ORAL

## 2023-01-30 NOTE — Progress Notes (Signed)
Progress Note   Patient: Dale Cook. ZOX:[id number] DOB: 01/29/1946 DOA: 01/29/2023     0 DOS: the patient was seen and examined on 01/30/2023   Brief hospital course: Kato Signer. is a 77 y.o. male with medical history significant for heart disease with stents and 2023 currently patient on dual antiplatelet with aspirin and Plavix, emphysema ,GERD, hypertension, preserved ejection fraction congestive heart failure EF of 60 to 65%.  Presenting earlier today and seen as consult requesting admission for chills, patient had elevated lactic, per nurses note patient had reports shortness of breath in the morning along with his chills.  No reports of chest pain headache palpitations dizziness speech  gait issues falls nausea vomiting or diarrhea. Chart review shows patient had a cataract extraction in June 2024 and is currently follows up with ophthalmology. In ED patient is alert awake oriented, vitals show temp of 99.5 reaching 102, normal blood pressure, O2 sats 96% on room air. Labs show normal CMP with a glucose of 163.  Normal troponin, BNP of 215.9. Leukocytosis of  16.4 normal hemoglobin normal platelet count. Initial lactic acid of 2 point 6 repeat of 2.8. Respiratory panel negative for COVID. Urinalysis shows trace leukocytes and more than 50 WBCs. Patient has a history of left knee replacement.   Assessment and Plan:  Sepsis from presumed urinary source Noted on admission with a Tmax of 102, marked leukocytosis, lactic acidosis and pyuria. Noted to have worsening leukocytosis, 16 >> 22.6 Continue empiric IV antibiotic therapy with Rocephin while awaiting urine culture results Follow-up blood culture results ID consult    History of coronary artery disease Status post PCI with stent angioplasty Continue aspirin, Plavix, carvedilol, nitrates and statin    Chronic diastolic dysfunction CHF 2D echocardiogram which was done 04/22 showed an LVEF of 60 to 65% with  grade 1 diastolic dysfunction. Furosemide and Entresto remain on hold due to relative hypotension        Subjective: Patient is seen and examined at bedside.  Has no new complaints  Physical Exam: Vitals:   01/29/23 2212 01/30/23 0408 01/30/23 0500 01/30/23 0718  BP: 134/68 (!) 100/53  110/68  Pulse: 87 86  81  Resp: 18 16  20   Temp: 98.4 F (36.9 C) 99.1 F (37.3 C)  99 F (37.2 C)  TempSrc: Oral Oral  Oral  SpO2: 99% 97%  96%  Weight:   104.7 kg   Height:       Physical Exam Vitals and nursing note reviewed.  Constitutional:      Appearance: Normal appearance.  HENT:     Head: Normocephalic and atraumatic.     Nose: Nose normal.     Mouth/Throat:     Mouth: Mucous membranes are moist.  Eyes:     Conjunctiva/sclera: Conjunctivae normal.  Cardiovascular:     Rate and Rhythm: Normal rate and regular rhythm.  Pulmonary:     Effort: Pulmonary effort is normal.     Breath sounds: Normal breath sounds.  Abdominal:     General: Abdomen is flat. Bowel sounds are normal.     Palpations: Abdomen is soft.  Musculoskeletal:        General: Normal range of motion.     Cervical back: Normal range of motion and neck supple.  Skin:    General: Skin is warm and dry.  Neurological:     General: No focal deficit present.     Mental Status: He is alert and oriented to  person, place, and time.  Psychiatric:        Mood and Affect: Mood normal.        Behavior: Behavior normal.     Data Reviewed: Labs reviewed.  Creatinine 1.32 which appears to be at baseline.  White count 22.6 Blood cultures no growth in less than 12 hours There are no new results to review at this time.  Family Communication: Plan of care discussed with patient at the bedside  Disposition: Status is: Observation The patient remains OBS appropriate and will d/c before 2 midnights.  Planned Discharge Destination: Home    Time spent: 35 minutes  Author: Lucile Shutters, MD 01/30/2023 11:19  AM  For on call review [email or web address].

## 2023-01-30 NOTE — Plan of Care (Signed)

## 2023-01-30 NOTE — Consult Note (Signed)
NAME: Dale Cook.  DOB: 1945-11-23  MRN: [id number]  Date/Time: 01/30/2023 11:22 AM  REQUESTING PROVIDER: Agbata Subjective:  REASON FOR CONSULT: sepsis ? Philippe Poston. is a 77 y.o. male with a history of CAD, s/op CABG X4, HFpEF, CKD, HTN, HLD Rt cataract surgery on 11/30/22, b/l TKA ,Presented ot the ED on 01/29/23 with sudden onset chills and some shortnes sof breath. This apparently started after he drank cold water. He went to [place] and was told he was looking ill and he came to the ED  01/29/23 19:16  BP 139/54 !  Temp 102 F (38.9 C) !  Pulse Rate 94  Resp 18  SpO2 97 %     Latest Reference Range & Units 01/29/23 11:46  WBC 4.0 - 10.5 K/uL 16.4 (H)  Hemoglobin 13.0 - 17.0 g/dL 78.2  HCT 95.6 - 21.3 % 43.9  Platelets 150 - 400 K/uL 187  Creatinine 0.61 - 1.24 mg/dL 0.86   Blood culture sent UA > 50 wbc Started on ceftriaxone and I am asked to see him for leucocytosis Pt is feeling better Says he has no fever, cough, sob chest pain, abd pain, diarrhea, dysuria, difficulty passing urine, tooth ache, no knee pain or swelling No tick bites No animal bites Lives on his own Past Medical History:  Diagnosis Date   (HFpEF) heart failure with preserved ejection fraction (HCC)    a.) TTE 01/11/2017: EF 65-70%; b.) TTE 10/15/2020: EF 60-65%. mild LAE, mild MR/AR, G1DD   Anginal pain (HCC)    Aortic atherosclerosis (HCC)    CAD (coronary artery disease)    a.) LHC 01/09/17: LAD 95p, 44m, 60/90d, LCx 80p/m, 166m, OM1 69m, OM2 90, RCA 99p; b.) 12/2016 CABG x 4 (LIMA-LAD, VG-dLAD, VG-OM1-OM2); c.) 11/2018 MV: EF 66%, no isch; d.) LHC/PCI 07/26/21: 100 LPAV, 90 p-mLCx (2.5 x 28 mm Synergy XD DES), 85 p-mLAD, 70 OM1, 80 m-dLAD, 50 dLAD; LIMA-LAD pat, SVG-dLAD and SVG-OM1/2 occ; e.) MV 07/13/2021: EF 60-65%, sm mod-sev part rev apical inf and mid inflat defect   Cardiac murmur    a.) grade II/VI early peaking systolic in aortic area   Essential hypertension    GERD  (gastroesophageal reflux disease)    High cholesterol    Left carotid bruit    Osteoarthritis    Wears dentures    partial upper    Past Surgical History:  Procedure Laterality Date   CATARACT EXTRACTION W/PHACO Left 06/12/2020   Procedure: CATARACT EXTRACTION PHACO AND INTRAOCULAR LENS PLACEMENT (IOC) LEFT 4.36 00:51.9 8.4%;  Surgeon: Lockie Mola, MD;  Location: [facility];  Service: Ophthalmology;  Laterality: Left;   CATARACT EXTRACTION W/PHACO Right 11/30/2022   Procedure: CATARACT EXTRACTION PHACO AND INTRAOCULAR LENS PLACEMENT (IOC) RIGHT;  Surgeon: Lockie Mola, MD;  Location: [facility];  Service: Ophthalmology;  Laterality: Right;  4.74 0:33.4   COLONOSCOPY WITH PROPOFOL N/A 11/09/2016   Procedure: COLONOSCOPY WITH PROPOFOL;  Surgeon: Earline Mayotte, MD;  Location: [facility];  Service: Endoscopy;  Laterality: N/A;   COLONOSCOPY WITH PROPOFOL N/A 11/29/2019   Procedure: COLONOSCOPY WITH PROPOFOL;  Surgeon: Earline Mayotte, MD;  Location: [facility];  Service: Endoscopy;  Laterality: N/A;   CORONARY ARTERY BYPASS GRAFT N/A 01/12/2017   Procedure: CORONARY ARTERY BYPASS GRAFTING x 4 (LIMA to mLAD, SVG to dLAD, sequential SVG to OM1-OM2, EVH via left thigh and leg); Surgeon: Purcell Nails, MD;  Location: [facility] OR;  Service: Open Heart Surgery;  Laterality:  N/A;   CORONARY STENT INTERVENTION N/A 08/11/2021   Procedure: CORONARY STENT INTERVENTION;  Surgeon: Iran Ouch, MD;  Location: [facility];  Service: Cardiovascular;  Laterality: N/A;   CORONARY ULTRASOUND/IVUS N/A 08/11/2021   Procedure: Intravascular Ultrasound/IVUS;  Surgeon: Iran Ouch, MD;  Location: [facility];  Service: Cardiovascular;  Laterality: N/A;   ECTROPION REPAIR Bilateral 06/01/2022   INTRAOPERATIVE TRANSESOPHAGEAL ECHOCARDIOGRAM N/A 01/12/2017   Procedure: INTRAOPERATIVE TRANSESOPHAGEAL ECHOCARDIOGRAM;  Surgeon: Purcell Nails, MD;   Location: [facility] OR;  Service: Open Heart Surgery;  Laterality: N/A;   LEFT HEART CATH AND CORONARY ANGIOGRAPHY Left 01/09/2017   Procedure: Left Heart Cath and Coronary Angiography;  Surgeon: Iran Ouch, MD;  Location: [facility];  Service: Cardiovascular;  Laterality: Left;   LEFT HEART CATH AND CORS/GRAFTS ANGIOGRAPHY N/A 07/26/2021   Procedure: LEFT HEART CATH AND CORS/GRAFTS ANGIOGRAPHY;  Surgeon: Iran Ouch, MD;  Location: [facility];  Service: Cardiovascular;  Laterality: N/A;   TOTAL KNEE ARTHROPLASTY Right 01/18/2021   Procedure: TOTAL KNEE ARTHROPLASTY;  Surgeon: Lyndle Herrlich, MD;  Location: [facility];  Service: Orthopedics;  Laterality: Right;   TOTAL KNEE ARTHROPLASTY Left 07/19/2022   Procedure: TOTAL KNEE ARTHROPLASTY;  Surgeon: Lyndle Herrlich, MD;  Location: [facility];  Service: Orthopedics;  Laterality: Left;    Social History   Socioeconomic History   Marital status: Divorced    Spouse name: Not on file   Number of children: 3   Years of education: Not on file   Highest education level: Not on file  Occupational History   Not on file  Tobacco Use   Smoking status: Never   Smokeless tobacco: Never  Vaping Use   Vaping status: Never Used  Substance and Sexual Activity   Alcohol use: No   Drug use: No   Sexual activity: Not Currently  Other Topics Concern   Not on file  Social History Narrative   Not on file   Social Determinants of Health   Financial Resource Strain: Not on file  Food Insecurity: No Food Insecurity (01/29/2023)   Hunger Vital Sign    Worried About Running Out of Food in the Last Year: Never true    Ran Out of Food in the Last Year: Never true  Transportation Needs: No Transportation Needs (01/29/2023)   PRAPARE - Administrator, Civil Service (Medical): No    Lack of Transportation (Non-Medical): No  Physical Activity: Sufficiently Active (12/23/2020)   Exercise Vital Sign    Days of Exercise per Week:  7 days    Minutes of Exercise per Session: 120 min  Stress: Not on file  Social Connections: Not on file  Intimate Partner Violence: Not At Risk (01/29/2023)   Humiliation, Afraid, Rape, and Kick questionnaire    Fear of Current or Ex-Partner: No    Emotionally Abused: No    Physically Abused: No    Sexually Abused: No    Family History  Problem Relation Age of Onset   Heart attack Mother    Colon cancer Brother    No Known Allergies I? Current Facility-Administered Medications  Medication Dose Route Frequency Provider Last Rate Last Admin   acetaminophen (TYLENOL) tablet 650 mg  650 mg Oral Q6H PRN Gertha Calkin, MD       Or   acetaminophen (TYLENOL) suppository 650 mg  650 mg Rectal Q6H PRN Gertha Calkin, MD  cefTRIAXone (ROCEPHIN) 2 g in sodium chloride 0.9 % 100 mL IVPB  2 g Intravenous Q24H Menshew, Charlesetta Ivory, PA-C   Stopped at 01/29/23 2146   erythromycin ophthalmic ointment   Right Eye Q6H Menshew, Charlesetta Ivory, PA-C   Given at 01/30/23 0604   heparin injection 5,000 Units  5,000 Units Subcutaneous Q8H Gertha Calkin, MD   5,000 Units at 01/30/23 0604   insulin aspart (novoLOG) injection 0-9 Units  0-9 Units Subcutaneous Q4H PRN Gertha Calkin, MD       morphine (PF) 2 MG/ML injection 2 mg  2 mg Intravenous Q4H PRN Gertha Calkin, MD       Oral care mouth rinse  15 mL Mouth Rinse PRN Gertha Calkin, MD       sodium chloride flush (NS) 0.9 % injection 3 mL  3 mL Intravenous Q12H Gertha Calkin, MD   3 mL at 01/30/23 0850     Abtx:  Anti-infectives (From admission, onward)    Start     Dose/Rate Route Frequency Ordered Stop   01/29/23 2045  cefTRIAXone (ROCEPHIN) 2 g in sodium chloride 0.9 % 100 mL IVPB        2 g 200 mL/hr over 30 Minutes Intravenous Every 24 hours 01/29/23 2030         REVIEW OF SYSTEMS:  Const:  fever, chills, rigors negative weight loss Eyes: negative diplopia or visual changes, negative eye pain ENT: negative coryza, negative sore  throat Resp: negative cough, hemoptysis, dyspnea Cards: negative for chest pain, palpitations, lower extremity edema GU: negative for frequency, dysuria and hematuria GI: Negative for abdominal pain, diarrhea, bleeding, constipation Skin: negative for rash and pruritus Heme: negative for easy bruising and gum/nose bleeding MS: weakness Neurolo:negative for headaches, dizziness, vertigo, memory problems  Psych: negative for feelings of anxiety, depression  Endocrine: new onset  diabetes Allergy/Immunology- negative for any medication or food allergies ?  Objective:  VITALS:  BP 110/68 (BP Location: Right Arm)   Pulse 81   Temp 99 F (37.2 C) (Oral)   Resp 20   Ht 6' (1.829 m)   Wt 104.7 kg   SpO2 96%   BMI 31.31 kg/m   PHYSICAL EXAM:  General: Alert, cooperative, no distress, appears stated age.  Head: Normocephalic, without obvious abnormality, atraumatic. Eyes: Conjunctivae clear, anicteric sclerae. Pupils are equal ENT Nares normal. No drainage or sinus tenderness. Lips, mucosa, and tongue normal. No Thrush Neck: Supple, symmetrical, no adenopathy, thyroid: non tender no carotid bruit and no JVD. Back: No CVA tenderness. Lungs: Clear to auscultation bilaterally. No Wheezing or Rhonchi. No rales. Heart: Regular rate and rhythm, no murmur, rub or gallop. Abdomen: Soft, non-tender,not distended. Bowel sounds normal. No masses Extremities: b/l knee surgical scar- healthy, no swelling or erythema  atraumatic, no cyanosis. No edema. No clubbing Skin: No rashes or lesions. Or bruising Lymph: Cervical, supraclavicular normal. Neurologic: Grossly non-focal Pertinent Labs Lab Results CBC    Component Value Date/Time   WBC 22.6 (H) 01/30/2023 0453   RBC 4.61 01/30/2023 0453   HGB 12.1 (L) 01/30/2023 0453   HGB 11.8 (L) 08/25/2021 1547   HCT 36.7 (L) 01/30/2023 0453   HCT 36.3 (L) 08/25/2021 1547   PLT 172 01/30/2023 0453   PLT 258 08/25/2021 1547   MCV 79.6 (L)  01/30/2023 0453   MCV 80 08/25/2021 1547   MCH 26.2 01/30/2023 0453   MCHC 33.0 01/30/2023 0453   RDW 16.1 (H) 01/30/2023  0453   RDW 15.1 08/25/2021 1547   LYMPHSABS 0.8 01/29/2023 1146   MONOABS 1.3 (H) 01/29/2023 1146   EOSABS 0.2 01/29/2023 1146   BASOSABS 0.0 01/29/2023 1146       Latest Ref Rng & Units 01/30/2023    4:53 AM 01/29/2023    9:52 PM 01/29/2023   11:46 AM  CMP  Glucose 70 - 99 mg/dL 132   440   BUN 8 - 23 mg/dL 15   17   Creatinine 1.02 - 1.24 mg/dL 7.25  3.66  4.40   Sodium 135 - 145 mmol/L 136   135   Potassium 3.5 - 5.1 mmol/L 3.6   4.2   Chloride 98 - 111 mmol/L 106   103   CO2 22 - 32 mmol/L 23   24   Calcium 8.9 - 10.3 mg/dL 8.0   8.5   Total Protein 6.5 - 8.1 g/dL 6.0   7.4   Total Bilirubin 0.3 - 1.2 mg/dL 0.8   0.5   Alkaline Phos 38 - 126 U/L 72   94   AST 15 - 41 U/L 15   22   ALT 0 - 44 U/L 15   19       Microbiology: Recent Results (from the past 240 hour(s))  SARS Coronavirus 2 by RT PCR (hospital order, performed in [facility] hospital lab) *cepheid single result test* Anterior Nasal Swab     Status: None   Collection Time: 01/29/23 11:56 AM   Specimen: Anterior Nasal Swab  Result Value Ref Range Status   SARS Coronavirus 2 by RT PCR NEGATIVE NEGATIVE Final    Comment: (NOTE) SARS-CoV-2 target nucleic acids are NOT DETECTED.  The SARS-CoV-2 RNA is generally detectable in upper and lower respiratory specimens during the acute phase of infection. The lowest concentration of SARS-CoV-2 viral copies this assay can detect is 250 copies / mL. A negative result does not preclude SARS-CoV-2 infection and should not be used as the sole basis for treatment or other patient management decisions.  A negative result may occur with improper specimen collection / handling, submission of specimen other than nasopharyngeal swab, presence of viral mutation(s) within the areas targeted by this assay, and inadequate number of viral copies (<250 copies /  mL). A negative result must be combined with clinical observations, patient history, and epidemiological information.  Fact Sheet for Patients:   RoadLapTop.co.za  Fact Sheet for Healthcare Providers: http://kim-miller.com/  This test is not yet approved or  cleared by the [place] FDA and has been authorized for detection and/or diagnosis of SARS-CoV-2 by FDA under an Emergency Use Authorization (EUA).  This EUA will remain in effect (meaning this test can be used) for the duration of the COVID-19 declaration under Section 564(b)(1) of the Act, 21 U.S.C. section 360bbb-3(b)(1), unless the authorization is terminated or revoked sooner.  Performed at [facility], [address] [place], [place] [address]   Culture, blood (routine x 2)     Status: None (Preliminary result)   Collection Time: 01/29/23  9:52 PM   Specimen: BLOOD LEFT ARM  Result Value Ref Range Status   Specimen Description BLOOD LEFT ARM  Final   Special Requests   Final    BOTTLES DRAWN AEROBIC AND ANAEROBIC Blood Culture results may not be optimal due to an excessive volume of blood received in culture bottles   Culture   Final    NO GROWTH < 12 HOURS Performed at [facility]  Lab, [address], [place], [place] [address]    Report Status PENDING  Incomplete  Culture, blood (routine x 2)     Status: None (Preliminary result)   Collection Time: 01/29/23 10:20 PM   Specimen: BLOOD RIGHT ARM  Result Value Ref Range Status   Specimen Description BLOOD RIGHT ARM  Final   Special Requests   Final    BOTTLES DRAWN AEROBIC AND ANAEROBIC Blood Culture adequate volume   Culture   Final    NO GROWTH < 12 HOURS Performed at [facility], [address] [place], [place] [address]    Report Status PENDING  Incomplete    IMAGING RESULTS:  I have personally reviewed the films ?cxr no infitrate Impression/Recommendation ?77 y.o. male with  a history of CAD, s/op CABG X4, HFpEF, CKD, HTN, HLD Rt cataract surgery on 11/30/22, b/l TKA ,Presented ot the ED on 01/29/23 with sudden onset chills and some shortness of breath  Fever and leucocytosis- sepsis No pneumonia, no cellulitis, or septic arthritis R/o genitourinary infection or GI infection Recommend CT abdomen to look for any pathology of GI or GU system that would explain the leucocytosis  ? AKI  CAD s/p CABG B/l TKA HTN HLD  A1c is 7 previously was 6.4? ___________________________________________________ Discussed with patient, requesting provider Note:  This document was prepared using Dragon voice recognition software and may include unintentional dictation errors.

## 2023-01-31 DIAGNOSIS — N390 Urinary tract infection, site not specified: Principal | ICD-10-CM

## 2023-01-31 DIAGNOSIS — A419 Sepsis, unspecified organism: Secondary | ICD-10-CM | POA: Diagnosis not present

## 2023-01-31 LAB — CBC WITH DIFFERENTIAL/PLATELET
Abs Immature Granulocytes: 0.05 10*3/uL (ref 0.00–0.07)
Basophils Absolute: 0 10*3/uL (ref 0.0–0.1)
Basophils Relative: 0 %
Eosinophils Absolute: 0.4 10*3/uL (ref 0.0–0.5)
Eosinophils Relative: 3 %
HCT: 39.9 % (ref 39.0–52.0)
Hemoglobin: 12.7 g/dL — ABNORMAL LOW (ref 13.0–17.0)
Immature Granulocytes: 0 %
Lymphocytes Relative: 6 %
Lymphs Abs: 0.9 10*3/uL (ref 0.7–4.0)
MCH: 26 pg (ref 26.0–34.0)
MCHC: 31.8 g/dL (ref 30.0–36.0)
MCV: 81.6 fL (ref 80.0–100.0)
Monocytes Absolute: 0.8 10*3/uL (ref 0.1–1.0)
Monocytes Relative: 6 %
Neutro Abs: 11.6 10*3/uL — ABNORMAL HIGH (ref 1.7–7.7)
Neutrophils Relative %: 85 %
Platelets: 173 10*3/uL (ref 150–400)
RBC: 4.89 MIL/uL (ref 4.22–5.81)
RDW: 16.2 % — ABNORMAL HIGH (ref 11.5–15.5)
WBC: 13.7 10*3/uL — ABNORMAL HIGH (ref 4.0–10.5)
nRBC: 0 % (ref 0.0–0.2)

## 2023-01-31 LAB — BASIC METABOLIC PANEL
Anion gap: 8 (ref 5–15)
BUN: 12 mg/dL (ref 8–23)
CO2: 23 mmol/L (ref 22–32)
Calcium: 8.4 mg/dL — ABNORMAL LOW (ref 8.9–10.3)
Chloride: 104 mmol/L (ref 98–111)
Creatinine, Ser: 1.13 mg/dL (ref 0.61–1.24)
GFR, Estimated: >60 mL/min (ref >60)
Glucose, Bld: 168 mg/dL — ABNORMAL HIGH (ref 70–99)
Potassium: 3.6 mmol/L (ref 3.5–5.1)
Sodium: 135 mmol/L (ref 135–145)

## 2023-01-31 LAB — GLUCOSE, CAPILLARY
Glucose-Capillary: 142 mg/dL — ABNORMAL HIGH (ref 70–99)
Glucose-Capillary: 126 mg/dL — ABNORMAL HIGH (ref 70–99)

## 2023-01-31 MED ORDER — ENOXAPARIN SODIUM 60 MG/0.6ML IJ SOSY: 0.5000 mg/kg | PREFILLED_SYRINGE | INTRAMUSCULAR | Status: DC

## 2023-01-31 MED ORDER — CEPHALEXIN 500 MG PO CAPS: 500.0000 mg | ORAL_CAPSULE | Freq: Two times a day (BID) | ORAL | 0 refills | Status: AC

## 2023-01-31 MED ORDER — CLOPIDOGREL BISULFATE 75 MG PO TABS
75.0000 mg | ORAL_TABLET | Freq: Every day | ORAL | Status: DC
  Administered 2023-01-31: 75 mg via ORAL
  Filled 2023-01-31: qty 1

## 2023-01-31 MED ORDER — MORPHINE SULFATE (PF) 2 MG/ML IV SOLN: 2.0000 mg | INTRAVENOUS | Status: DC | PRN

## 2023-01-31 MED ORDER — ATORVASTATIN CALCIUM 20 MG PO TABS
40.0000 mg | ORAL_TABLET | Freq: Every day | ORAL | Status: DC
  Administered 2023-01-31: 40 mg via ORAL
  Filled 2023-01-31: qty 2

## 2023-01-31 MED ORDER — SACUBITRIL-VALSARTAN 24-26 MG PO TABS
1.0000 | ORAL_TABLET | Freq: Two times a day (BID) | ORAL | Status: DC
  Administered 2023-01-31: 1 via ORAL
  Filled 2023-01-31: qty 1

## 2023-01-31 MED ORDER — ASPIRIN 81 MG PO TBEC
81.0000 mg | DELAYED_RELEASE_TABLET | Freq: Every day | ORAL | Status: DC
  Administered 2023-01-31: 81 mg via ORAL
  Filled 2023-01-31: qty 1

## 2023-01-31 MED ORDER — CARVEDILOL 25 MG PO TABS
25.0000 mg | ORAL_TABLET | Freq: Two times a day (BID) | ORAL | Status: DC
  Administered 2023-01-31: 25 mg via ORAL
  Filled 2023-01-31: qty 1

## 2023-01-31 NOTE — Plan of Care (Signed)
  Problem: Coping: Goal: Ability to adjust to condition or change in health will improve Outcome: Progressing   Problem: Health Behavior/Discharge Planning: Goal: Ability to manage health-related needs will improve Outcome: Progressing   Problem: Metabolic: Goal: Ability to maintain appropriate glucose levels will improve Outcome: Progressing   Problem: Nutritional: Goal: Maintenance of adequate nutrition will improve Outcome: Progressing   Problem: Skin Integrity: Goal: Risk for impaired skin integrity will decrease Outcome: Progressing   Problem: Education: Goal: Knowledge of General Education information will improve Description: Including pain rating scale, medication(s)/side effects and non-pharmacologic comfort measures Outcome: Progressing   Problem: Health Behavior/Discharge Planning: Goal: Ability to manage health-related needs will improve Outcome: Progressing   Problem: Clinical Measurements: Goal: Diagnostic test results will improve Outcome: Progressing Goal: Respiratory complications will improve Outcome: Progressing   Problem: Activity: Goal: Risk for activity intolerance will decrease Outcome: Progressing   Problem: Coping: Goal: Level of anxiety will decrease Outcome: Progressing   Problem: Elimination: Goal: Will not experience complications related to bowel motility Outcome: Progressing Goal: Will not experience complications related to urinary retention Outcome: Progressing   Problem: Pain Managment: Goal: General experience of comfort will improve Outcome: Progressing

## 2023-01-31 NOTE — Discharge Summary (Signed)
Physician Discharge Summary   Patient: Dale Cook. MRN: [id number] DOB: Mar 21, 1946  Admit date:     01/29/2023  Discharge date: 01/31/23  Discharge Physician:     PCP: [name], MD   Recommendations at discharge:   Complete antibiotic therapy as recommended  Discharge Diagnoses: Principal Problem:   Sepsis secondary to UTI [facility]) Active Problems:   Coronary artery disease involving native coronary artery with angina pectoris (HCC)   Essential hypertension   Chronic diastolic CHF (congestive heart failure) (HCC)   CAD (coronary artery disease)  Resolved Problems:   * No resolved hospital problems. *  Hospital Course: Dale Cook. is a 77 y.o. male with a medical history significant for heart disease with stents and 2023 currently patient is on dual antiplatelet with aspirin and Plavix, emphysema ,GERD, hypertension, preserved ejection fraction congestive heart failure EF of 60 to 65%.  Patient presented earlier in the day and seen as an ER consult requesting admission for chills, patient had elevated lactic, per nurses note patient had reports shortness of breath in the morning along with his chills.  No reports of chest pain headache palpitations dizziness speech  gait issues falls nausea vomiting or diarrhea. Chart review shows patient had a cataract extraction in June 2024 and is currently follows up with ophthalmology. In ED patient is alert awake oriented, vitals show temp of 99.5 reaching 102, normal blood pressure, O2 sats 96% on room air. Labs show normal CMP with a glucose of 163.  Normal troponin, BNP of 215.9. Leukocytosis of  16.4 normal hemoglobin normal platelet count. Initial lactic acid of 2 point 6 repeat of 2.8. Respiratory panel negative for COVID. Urinalysis shows trace leukocytes and more than 50 WBCs. Patient has a history of left knee replacement.   Assessment and Plan:  Sepsis from UTI Noted on admission with a Tmax  of 102, marked leukocytosis, lactic acidosis and pyuria. Noted to have worsening leukocytosis, 16 >> 22.6 but now shows a downward trend Patient received empiric antibiotic therapy with Rocephin.  Urine culture yields greater than 100,000 colonies of E. coli Patient will be discharged home on Keflex 500 mg p.o. twice daily for 7 days       History of coronary artery disease Status post PCI with stent angioplasty Continue aspirin, Plavix, carvedilol, nitrates and statin       Chronic diastolic dysfunction CHF 2D echocardiogram which was done 04/22 showed an LVEF of 60 to 65% with grade 1 diastolic dysfunction. Continue furosemide and Entresto     Patient was seen and examined at the bedside and is in stable condition for discharge.       Consultants: Infectious disease Procedures performed: None Disposition: Home Diet recommendation:  Discharge Diet Orders (From admission, onward)     Start     Ordered   01/31/23 0000  Diet - low sodium heart healthy        01/31/23 1201   01/31/23 0000  Diet - low sodium heart healthy        01/31/23 1206           Cardiac and Carb modified diet DISCHARGE MEDICATION: Allergies as of 01/31/2023   No Known Allergies      Medication List     TAKE these medications    acetaminophen 325 MG tablet Commonly known as: TYLENOL Take 2 tablets (650 mg total) by mouth every 4 (four) hours as needed for headache or mild pain.   aspirin 81  MG chewable tablet Chew 81 mg by mouth daily.   atorvastatin 40 MG tablet Commonly known as: LIPITOR Take 40 mg by mouth daily.   carvedilol 25 MG tablet Commonly known as: COREG TAKE 1 TABLET TWICE DAILY   cephALEXin 500 MG capsule Commonly known as: KEFLEX Take 1 capsule (500 mg total) by mouth 2 (two) times daily for 7 days.   clopidogrel 75 MG tablet Commonly known as: PLAVIX TAKE 1 TABLET BY MOUTH EVERY DAY   Entresto 24-26 MG Generic drug: sacubitril-valsartan Take 1 tablet by  mouth 2 (two) times daily.   furosemide 20 MG tablet Commonly known as: LASIX Take 20 mg by mouth every other day.   ipratropium 0.06 % nasal spray Commonly known as: ATROVENT Place 2 sprays into both nostrils 4 (four) times daily.   isosorbide mononitrate 30 MG 24 hr tablet Commonly known as: IMDUR Take 1 tablet (30 mg total) by mouth daily.   loratadine 10 MG tablet Commonly known as: CLARITIN Take 10 mg by mouth daily as needed for allergies.   multivitamin capsule Take 1 capsule by mouth daily.   neomycin-polymyxin b-dexamethasone 3.5-10000-0.1 Susp Commonly known as: MAXITROL Place 1 drop into both eyes as needed (dry eye).   pantoprazole 40 MG tablet Commonly known as: PROTONIX TAKE 1 TABLET EVERY DAY        Follow-up Information     [name], MD. Call .   Specialty: Internal Medicine Why: As needed Contact information: [address]   [place] [address] [phone]         [name], MD Follow up in 1 week(s).   Specialty: Internal Medicine Contact information: [address]   [place] [address] [phone]                Discharge Exam: Filed Weights   01/29/23 1148 01/30/23 0500 01/31/23 0500  Weight: 103.7 kg 104.7 kg 109.2 kg   Constitutional:      Appearance: Normal appearance.  HENT:     Head: Normocephalic and atraumatic.     Nose: Nose normal.     Mouth/Throat:     Mouth: Mucous membranes are moist.  Eyes:     Conjunctiva/sclera: Conjunctivae normal.  Cardiovascular:     Rate and Rhythm: Normal rate and regular rhythm.  Pulmonary:     Effort: Pulmonary effort is normal.     Breath sounds: Normal breath sounds.  Abdominal:     General: Abdomen is flat. Bowel sounds are normal.     Palpations: Abdomen is soft.  Musculoskeletal:        General: Normal range of motion.     Cervical back: Normal range of motion and neck supple.  Skin:    General: Skin is warm and dry.  Neurological:      General: No focal deficit present.     Mental Status: He is alert and oriented to person, place, and time.  Psychiatric:        Mood and Affect: Mood normal.        Behavior: Behavior normal.     Condition at discharge: stable  The results of significant diagnostics from this hospitalization (including imaging, microbiology, ancillary and laboratory) are listed below for reference.   Imaging Studies: CT ABDOMEN PELVIS W CONTRAST  Result Date: 01/30/2023 CLINICAL DATA:  Abdominal pain, chills, elevated lactate. Leukocytosis. EXAM: CT ABDOMEN AND PELVIS WITH CONTRAST TECHNIQUE: Multidetector CT imaging of the abdomen and pelvis was  performed using the standard protocol following bolus administration of intravenous contrast. RADIATION DOSE REDUCTION: This exam was performed according to the departmental dose-optimization program which includes automated exposure control, adjustment of the mA and/or kV according to patient size and/or use of iterative reconstruction technique. CONTRAST:  OMNIPAQUE IOHEXOL 300 MG/ML  SOLN COMPARISON:  Overlapping portions CTA chest 01/29/2023 FINDINGS: Lower chest: Coronary and aortic atherosclerosis. Aortic and mitral valve calcifications. Prior median sternotomy. Hepatobiliary: Unremarkable Pancreas: Unremarkable Spleen: Unremarkable Adrenals/Urinary Tract: The kidneys and adrenal glands appear normal. No findings of pyelonephritis. No overt bladder wall thickening. Stomach/Bowel: Normal appendix. No dilated bowel. No significant diverticulosis or diverticulitis. Vascular/Lymphatic: Mild aortoiliac atheromatous vascular disease. Today's exam was not performed as a CT angiogram but the celiac trunk, SMA, and IMA appear patent without high-grade stenosis in the visualized portions. No pathologic adenopathy observed. Reproductive: The prostate gland measures 5.8 by 4.9 by 6.6 cm (volume = 98 cm^3), compatible with prostatomegaly. Heterogeneous enhancement in the  prostate gland is nonspecific. Other: No supplemental non-categorized findings. Musculoskeletal: Moderate degenerative arthropathy of both hips. Lumbar and lower thoracic spondylosis and degenerative disc disease with congenitally short pedicles in the lumbar spine contributing to impingement at all levels between L2 and S1. IMPRESSION: 1. No specific cause for the patient's abdominal pain is identified. 2. Prostatomegaly with prostate volume of 98 cc. 3. Aortic, coronary, and systemic atherosclerosis without high-grade proximal mesenteric stenosis. 4. Lumbar impingement at all levels between L2 and S1. 5. Moderate degenerative arthropathy of both hips. Aortic Atherosclerosis (ICD10-I70.0). Electronically Signed   By: Gaylyn Rong M.D.   On: 01/30/2023 20:33   CT Angio Chest Pulmonary Embolism (PE) W or WO Contrast  Result Date: 01/29/2023 CLINICAL DATA:  Pulmonary embolism (PE) suspected, high prob EXAM: CT ANGIOGRAPHY CHEST WITH CONTRAST TECHNIQUE: Multidetector CT imaging of the chest was performed using the standard protocol during bolus administration of intravenous contrast. Multiplanar CT image reconstructions and MIPs were obtained to evaluate the vascular anatomy. RADIATION DOSE REDUCTION: This exam was performed according to the departmental dose-optimization program which includes automated exposure control, adjustment of the mA and/or kV according to patient size and/or use of iterative reconstruction technique. CONTRAST:  75mL OMNIPAQUE IOHEXOL 350 MG/ML SOLN COMPARISON:  05/03/2021 FINDINGS: Cardiovascular: Prior CABG. Cardiomegaly. No filling defects in the pulmonary arteries to suggest pulmonary emboli. Aortic atherosclerosis. No aneurysm. Mediastinum/Nodes: No mediastinal, hilar, or axillary adenopathy. Trachea and esophagus are unremarkable. Thyroid unremarkable. Lungs/Pleura: Mild elevation of the right hemidiaphragm, stable. No confluent opacities or effusions. Upper Abdomen: No acute  findings Musculoskeletal: Chest wall soft tissues are unremarkable. No acute bony abnormality. Review of the MIP images confirms the above findings. IMPRESSION: No evidence of pulmonary embolus. Cardiomegaly, prior CABG. No acute cardiopulmonary disease. Aortic Atherosclerosis (ICD10-I70.0). Electronically Signed   By: Charlett Nose M.D.   On: 01/29/2023 21:43   DG Chest 2 View  Result Date: 01/29/2023 CLINICAL DATA:  Shortness of breath. EXAM: CHEST - 2 VIEW COMPARISON:  06/08/2022 FINDINGS: The lungs are clear without focal pneumonia, edema, pneumothorax or pleural effusion. The cardiopericardial silhouette is within normal limits for size. No acute bony abnormality. Telemetry leads overlie the chest. IMPRESSION: No active cardiopulmonary disease. Electronically Signed   By: Kennith Center M.D.   On: 01/29/2023 12:28    Microbiology: Results for orders placed or performed during the hospital encounter of 01/29/23  SARS Coronavirus 2 by RT PCR (hospital order, performed in [facility] hospital lab) *cepheid single result test* Anterior Nasal Swab  Status: None   Collection Time: 01/29/23 11:56 AM   Specimen: Anterior Nasal Swab  Result Value Ref Range Status   SARS Coronavirus 2 by RT PCR NEGATIVE NEGATIVE Final    Comment: (NOTE) SARS-CoV-2 target nucleic acids are NOT DETECTED.  The SARS-CoV-2 RNA is generally detectable in upper and lower respiratory specimens during the acute phase of infection. The lowest concentration of SARS-CoV-2 viral copies this assay can detect is 250 copies / mL. A negative result does not preclude SARS-CoV-2 infection and should not be used as the sole basis for treatment or other patient management decisions.  A negative result may occur with improper specimen collection / handling, submission of specimen other than nasopharyngeal swab, presence of viral mutation(s) within the areas targeted by this assay, and inadequate number of viral copies (<250 copies /  mL). A negative result must be combined with clinical observations, patient history, and epidemiological information.  Fact Sheet for Patients:   RoadLapTop.co.za  Fact Sheet for Healthcare Providers: http://kim-miller.com/  This test is not yet approved or  cleared by the [place] FDA and has been authorized for detection and/or diagnosis of SARS-CoV-2 by FDA under an Emergency Use Authorization (EUA).  This EUA will remain in effect (meaning this test can be used) for the duration of the COVID-19 declaration under Section 564(b)(1) of the Act, 21 U.S.C. section 360bbb-3(b)(1), unless the authorization is terminated or revoked sooner.  Performed at [facility], [address], [place], [place] [address]   Urine Culture     Status: Abnormal (Preliminary result)   Collection Time: 01/29/23  1:05 PM   Specimen: Urine, Random  Result Value Ref Range Status   Specimen Description   Final    URINE, RANDOM Performed at [facility], [address] [place], [place] [address]    Special Requests   Final    Normal Performed at [facility], [address] [place], [place] [address]    Culture (A)  Final    >=100,000 COLONIES/mL ESCHERICHIA COLI SUSCEPTIBILITIES TO FOLLOW Performed at [facility], [address] [place], [place] [address]    Report Status PENDING  Incomplete  Culture, blood (routine x 2)     Status: None (Preliminary result)   Collection Time: 01/29/23  9:52 PM   Specimen: BLOOD LEFT ARM  Result Value Ref Range Status   Specimen Description BLOOD LEFT ARM  Final   Special Requests   Final    BOTTLES DRAWN AEROBIC AND ANAEROBIC Blood Culture results may not be optimal due to an excessive volume of blood received in culture bottles   Culture   Final    NO GROWTH 2 DAYS Performed at [facility], [address] [place], [place] [address]    Report Status PENDING   Incomplete  Culture, blood (routine x 2)     Status: None (Preliminary result)   Collection Time: 01/29/23 10:20 PM   Specimen: BLOOD RIGHT ARM  Result Value Ref Range Status   Specimen Description BLOOD RIGHT ARM  Final   Special Requests   Final    BOTTLES DRAWN AEROBIC AND ANAEROBIC Blood Culture adequate volume   Culture   Final    NO GROWTH 2 DAYS Performed at [facility], [address] [place], [place] [address]    Report Status PENDING  Incomplete    Labs: CBC: Recent Labs  Lab 01/29/23 1146 01/29/23 2152 01/30/23 0453 01/31/23 0836  WBC 16.4* 20.0* 22.6*  13.7*  NEUTROABS 14.1*  --   --  11.6*  HGB 13.9 11.0* 12.1* 12.7*  HCT 43.9 34.7* 36.7* 39.9  MCV 82.2 82.4 79.6* 81.6  PLT 187 140* 172 173   Basic Metabolic Panel: Recent Labs  Lab 01/29/23 1146 01/29/23 2152 01/30/23 0453 01/31/23 0836  NA 135  --  136 135  K 4.2  --  3.6 3.6  CL 103  --  106 104  CO2 24  --  23 23  GLUCOSE 163*  --  119* 168*  BUN 17  --  15 12  CREATININE 1.24 1.22 1.32* 1.13  CALCIUM 8.5*  --  8.0* 8.4*   Liver Function Tests: Recent Labs  Lab 01/29/23 1146 01/30/23 0453  AST 22 15  ALT 19 15  ALKPHOS 94 72  BILITOT 0.5 0.8  PROT 7.4 6.0*  ALBUMIN 3.6 2.9*   CBG: Recent Labs  Lab 01/30/23 1139 01/30/23 1540 01/30/23 2055 01/31/23 0810 01/31/23 1125  GLUCAP 136* 104* 177* 142* 126*    Discharge time spent: greater than 30 minutes.  Signed: Lucile Shutters, MD [facility] 01/31/2023

## 2023-01-31 NOTE — Progress Notes (Signed)
Patient being discharged home. All belongings sent home with patient, all discharge education and instructions provided and IV's removed. All questions answered and patient voiced understanding of discharge education and instructions.

## 2023-02-01 ENCOUNTER — Other Ambulatory Visit: Payer: Self-pay | Admitting: Physician Assistant

## 2023-02-24 ENCOUNTER — Telehealth: Payer: Self-pay | Admitting: Cardiovascular Disease

## 2023-02-24 MED ORDER — ENTRESTO 24-26 MG PO TABS: 1.0000 | ORAL_TABLET | Freq: Two times a day (BID) | ORAL | Status: DC

## 2023-02-24 NOTE — Telephone Encounter (Signed)
Pt c/o medication issue:  1. Name of Medication:   sacubitril-valsartan (ENTRESTO) 24-26 MG   2. How are you currently taking this medication (dosage and times per day)?   As prescribed  3. Are you having a reaction (difficulty breathing--STAT)?   4. What is your medication issue?   Patient stated he has been out of this medication for about a week and is still waiting for his application process to be completed.

## 2023-02-24 NOTE — Telephone Encounter (Signed)
Called and spoke with patient. Patient has ran out of [place] and waiting to hear back from Patient Assistance. Samples of Entresto left in sample closet for patient. Patient states that he will pick up the samples today.

## 2023-03-01 NOTE — Telephone Encounter (Signed)
Spoke with patient and reviewed approval from [employer]. He currently has some samples that should hold him over until he receives some from the company. He was appreciative for the call with no further questions at this time.

## 2023-03-01 NOTE — Telephone Encounter (Signed)
Letter received from [employer] stating that he has been approved and is eligible to receive Entresto until 06/27/23 at no cost to him. Will reach out to patient to make him aware.

## 2023-03-13 ENCOUNTER — Other Ambulatory Visit: Payer: Self-pay

## 2023-03-13 ENCOUNTER — Telehealth: Payer: Self-pay | Admitting: Physician Assistant

## 2023-03-13 MED ORDER — ENTRESTO 24-26 MG PO TABS: 1.0000 | ORAL_TABLET | Freq: Two times a day (BID) | ORAL | 0 refills | Status: DC

## 2023-03-13 MED ORDER — ENTRESTO 24-26 MG PO TABS: 1.0000 | ORAL_TABLET | Freq: Two times a day (BID) | ORAL | 0 refills | Status: AC

## 2023-03-13 NOTE — Telephone Encounter (Signed)
Disp Refills Start End   sacubitril-valsartan (ENTRESTO) 24-26 MG 180 tablet 0 03/13/2023 --   Sig - Route: Take 1 tablet by mouth 2 (two) times daily. - Oral   Sent to pharmacy as: sacubitril-valsartan (ENTRESTO) 24-26 MG   E-Prescribing Status: Receipt confirmed by pharmacy (03/13/2023 11:29 AM EDT)    Pharmacy  [id number] [place], [place] - [address]

## 2023-03-13 NOTE — Telephone Encounter (Signed)
*  STAT* If patient is at the pharmacy, call can be transferred to refill team.   1. Which medications need to be refilled? (please list name of each medication and dose if known)   sacubitril-valsartan (ENTRESTO) 24-26 MG   2. Which pharmacy/location (including street and city if local pharmacy) is medication to be sent to?  [id number] - [place], [place] - [address]   3. Do they need a 30 day or 90 day supply? 90 day    Patient took last two tablets yesterday, 03/12/23.   Please advise.

## 2023-03-17 ENCOUNTER — Encounter: Payer: Self-pay | Admitting: *Deleted

## 2023-03-20 NOTE — Progress Notes (Signed)
Cardiology Office Note    Date:  03/24/2023   ID:  Dale Gulbransen., DOB 1945/09/01, MRN [id number]  PCP:  Dale Shaggy, MD  Cardiologist:  [name], MD  Electrophysiologist:  None   Chief Complaint: Follow up  History of Present Illness:   Dale Allsopp. is a 77 y.o. male with history of CAD status post CABG in 12/2016 s/p PCI/DES to the LCx on 08/11/2021, HFpEF, HTN, and HLD who presents for follow up of CAD and HFpEF.    He presented in 2018 with unstable angina with LHC showing severe three-vessel CAD.  He subsequently underwent four-vessel CABG in 12/2016 with LIMA to mid LAD, SVG to distal LAD, jump graft with SVG to OM1 and OM2.  Carotid artery ultrasound prior to surgery showed no significant disease.  Echo showed normal LV systolic function with no significant valvular abnormalities.  Lexiscan MPI on 07/13/2021 showed a small in size, moderate in severity, partially reversible defect involving the apical inferior and mid inferolateral segments most consistent with scar and peri-infarct ischemia.  Compared to prior study in 2020, the mid/apical inferior, inferolateral defect was new. LVEF normal. Due to persistent symptoms, he underwent LHC on 07/26/2021, that showed significant underlying 3-vessel CAD with patent LIMA to mid LAD, and occluded SVG to distal LAD and SVG to OM1/OM2. The RCA was not injected and known to be a small nondominant vessel and occluded in the mid segment.  There was normal LVSF and mildly elevated LVEDP. The LAD was significantly diseased after the LIMA anastomosis and likely not suitable for PCI given the extension of disease to the apical LAD. The LCx could be treated with PCI, but at the bifurcation lesion would require a long segment stenting with recommendation for staged PCI at [facility].     He was admitted to [facility] in 07/2021 with dyspnea concerning for anginal equivalent along with volume depletion. Imdur was added and ultimately, the  decision was made to move forward with planned LHC with Dr. Kirke Corin at [facility] the following week.    He underwent planned repeat LHC at [facility] on 08/11/2021 with successful IVUS-guide PCI/DES to the LCx using 1 long stent (2.5x48 mm). There was significant vessel size mismatch between the distal and proximal area with the distal segment 2.5 mm in diameter and the proximal area 3.5 mm in diameter. The stent was post-dilated to the maximal stent ID, 3.5 mm. The OM2 had borderline disease proximally, though was supplied by good flow from the Y graft from OM3.     He was seen in the ED on 09/22/2021 with intermittent ankle and feet swelling.  BNP 131.  CRP and sed rate elevated.  X-ray revealed subacute to chronic nondisplaced fracture of the distal tip of both medial malleoli along with osteoarthritis noted bilaterally.  Symptoms were felt to be orthopedic in etiology.   He was seen in the ED on 11/04/2021 with shortness of breath and intermittent lower extremity swelling.  High-sensitivity troponin negative.  BNP 180.  He was given IV Lasix in the ED with recommendation for outpatient follow-up.   He was seen in the office on 11/25/2021, and continued to note intermittent bilateral ankle/pedal edema as well as bilateral knee and foot pain that were attributed to osteoarthritis.  His weight was down 4 pounds when compared to his prior visit.  Amlodipine was discontinued and he was initiated on Entresto.  He was admitted to the hospital in 01/2023 with  sepsis from UTI.  High-sensitivity troponin negative to.  D-dimer normal.  BNP 215.  He comes in doing well from a cardiac perspective and is without symptoms of angina or cardiac decompensation.  No dyspnea, palpitations, dizziness, presyncope, or syncope.  Lower extremity swelling, abdominal distention, orthopnea, PND, or early satiety.  Not adding salt to food.  Drinking less than 2 L of liquid daily.  Weight stable at home.  Taking furosemide Monday  through Friday.  No falls or symptoms concerning for bleeding.   Labs independently reviewed: 01/2023 - potassium 3.6, BUN 12, serum creatinine 1.13, Hgb 12.7, PLT 173, albumin 2.9, AST/ALT normal, A1c 7.0, TSH normal, BNP 215 11/2022 - TC 113, TG 92, HDL 38, LDL 57  Past Medical History:  Diagnosis Date   (HFpEF) heart failure with preserved ejection fraction (HCC)    a.) TTE 01/11/2017: EF 65-70%; b.) TTE 10/15/2020: EF 60-65%. mild LAE, mild MR/AR, G1DD   Anginal pain (HCC)    Aortic atherosclerosis (HCC)    CAD (coronary artery disease)    a.) LHC 01/09/17: LAD 95p, 34m, 60/90d, LCx 80p/m, 149m, OM1 57m, OM2 90, RCA 99p; b.) 12/2016 CABG x 4 (LIMA-LAD, VG-dLAD, VG-OM1-OM2); c.) 11/2018 MV: EF 66%, no isch; d.) LHC/PCI 07/26/21: 100 LPAV, 90 p-mLCx (2.5 x 28 mm Synergy XD DES), 85 p-mLAD, 70 OM1, 80 m-dLAD, 50 dLAD; LIMA-LAD pat, SVG-dLAD and SVG-OM1/2 occ; e.) MV 07/13/2021: EF 60-65%, sm mod-sev part rev apical inf and mid inflat defect   Cardiac murmur    a.) grade II/VI early peaking systolic in aortic area   Essential hypertension    GERD (gastroesophageal reflux disease)    High cholesterol    Left carotid bruit    Osteoarthritis    Wears dentures    partial upper    Past Surgical History:  Procedure Laterality Date   CATARACT EXTRACTION W/PHACO Left 06/12/2020   Procedure: CATARACT EXTRACTION PHACO AND INTRAOCULAR LENS PLACEMENT (IOC) LEFT 4.36 00:51.9 8.4%;  Surgeon: Lockie Mola, MD;  Location: [facility];  Service: Ophthalmology;  Laterality: Left;   CATARACT EXTRACTION W/PHACO Right 11/30/2022   Procedure: CATARACT EXTRACTION PHACO AND INTRAOCULAR LENS PLACEMENT (IOC) RIGHT;  Surgeon: Lockie Mola, MD;  Location: [facility];  Service: Ophthalmology;  Laterality: Right;  4.74 0:33.4   COLONOSCOPY WITH PROPOFOL N/A 11/09/2016   Procedure: COLONOSCOPY WITH PROPOFOL;  Surgeon: Earline Mayotte, MD;  Location: [facility];  Service: Endoscopy;   Laterality: N/A;   COLONOSCOPY WITH PROPOFOL N/A 11/29/2019   Procedure: COLONOSCOPY WITH PROPOFOL;  Surgeon: Earline Mayotte, MD;  Location: [facility];  Service: Endoscopy;  Laterality: N/A;   CORONARY ARTERY BYPASS GRAFT N/A 01/12/2017   Procedure: CORONARY ARTERY BYPASS GRAFTING x 4 (LIMA to mLAD, SVG to dLAD, sequential SVG to OM1-OM2, EVH via left thigh and leg); Surgeon: Purcell Nails, MD;  Location: [facility] OR;  Service: Open Heart Surgery;  Laterality: N/A;   CORONARY STENT INTERVENTION N/A 08/11/2021   Procedure: CORONARY STENT INTERVENTION;  Surgeon: Iran Ouch, MD;  Location: [facility];  Service: Cardiovascular;  Laterality: N/A;   CORONARY ULTRASOUND/IVUS N/A 08/11/2021   Procedure: Intravascular Ultrasound/IVUS;  Surgeon: Iran Ouch, MD;  Location: [facility];  Service: Cardiovascular;  Laterality: N/A;   ECTROPION REPAIR Bilateral 06/01/2022   INTRAOPERATIVE TRANSESOPHAGEAL ECHOCARDIOGRAM N/A 01/12/2017   Procedure: INTRAOPERATIVE TRANSESOPHAGEAL ECHOCARDIOGRAM;  Surgeon: Purcell Nails, MD;  Location: [facility] OR;  Service: Open Heart Surgery;  Laterality: N/A;  LEFT HEART CATH AND CORONARY ANGIOGRAPHY Left 01/09/2017   Procedure: Left Heart Cath and Coronary Angiography;  Surgeon: Iran Ouch, MD;  Location: [facility];  Service: Cardiovascular;  Laterality: Left;   LEFT HEART CATH AND CORS/GRAFTS ANGIOGRAPHY N/A 07/26/2021   Procedure: LEFT HEART CATH AND CORS/GRAFTS ANGIOGRAPHY;  Surgeon: Iran Ouch, MD;  Location: [facility];  Service: Cardiovascular;  Laterality: N/A;   TOTAL KNEE ARTHROPLASTY Right 01/18/2021   Procedure: TOTAL KNEE ARTHROPLASTY;  Surgeon: Lyndle Herrlich, MD;  Location: [facility] ORS;  Service: Orthopedics;  Laterality: Right;   TOTAL KNEE ARTHROPLASTY Left 07/19/2022   Procedure: TOTAL KNEE ARTHROPLASTY;  Surgeon: Lyndle Herrlich, MD;  Location: [facility] OR[facility];  Service: Orthopedics;  Laterality: Left;     Current Medications: Current Meds  Medication Sig   acetaminophen (TYLENOL) 325 MG tablet Take 2 tablets (650 mg total) by mouth every 4 (four) hours as needed for headache or mild pain.   aspirin 81 MG chewable tablet Chew 81 mg by mouth daily.   atorvastatin (LIPITOR) 40 MG tablet Take 40 mg by mouth daily.   carvedilol (COREG) 25 MG tablet TAKE 1 TABLET TWICE DAILY   clopidogrel (PLAVIX) 75 MG tablet TAKE 1 TABLET BY MOUTH EVERY DAY   furosemide (LASIX) 20 MG tablet Take 20 mg by mouth every other day. Mon - Friday only.   isosorbide mononitrate (IMDUR) 30 MG 24 hr tablet Take 1 tablet (30 mg total) by mouth daily.   loratadine (CLARITIN) 10 MG tablet Take 10 mg by mouth daily as needed for allergies. PRN   methocarbamol (ROBAXIN) 500 MG tablet Take 1 tablet by mouth every 8 (eight) hours as needed. PRN   Multiple Vitamin (MULTIVITAMIN) capsule Take 1 capsule by mouth daily.   pantoprazole (PROTONIX) 40 MG tablet TAKE 1 TABLET EVERY DAY   sacubitril-valsartan (ENTRESTO) 24-26 MG Take 1 tablet by mouth 2 (two) times daily.    Allergies:   Patient has no known allergies.   Social History   Socioeconomic History   Marital status: Divorced    Spouse name: Not on file   Number of children: 3   Years of education: Not on file   Highest education level: Not on file  Occupational History   Not on file  Tobacco Use   Smoking status: Never   Smokeless tobacco: Never  Vaping Use   Vaping status: Never Used  Substance and Sexual Activity   Alcohol use: No   Drug use: No   Sexual activity: Not Currently  Other Topics Concern   Not on file  Social History Narrative   Not on file   Social Determinants of Health   Financial Resource Strain: Not on file  Food Insecurity: No Food Insecurity (01/29/2023)   Hunger Vital Sign    Worried About Running Out of Food in the Last Year: Never true    Ran Out of Food in the Last Year: Never true  Transportation Needs: No Transportation  Needs (01/29/2023)   PRAPARE - Administrator, Civil Service (Medical): No    Lack of Transportation (Non-Medical): No  Physical Activity: Sufficiently Active (12/23/2020)   Exercise Vital Sign    Days of Exercise per Week: 7 days    Minutes of Exercise per Session: 120 min  Stress: Not on file  Social Connections: Not on file     Family History:  The patient's family history includes Colon cancer in his brother; Heart attack  in his mother.  ROS:   12-point review of systems is negative unless otherwise noted in the HPI.   EKGs/Labs/Other Studies Reviewed:    Studies reviewed were summarized above. The additional studies were reviewed today:  LHC 08/11/2021:   LPAV lesion is 100% stenosed.   Prox LAD to Mid LAD lesion is 95% stenosed.   Mid Cx to Dist Cx lesion is 95% stenosed.   2nd Mrg lesion is 60% stenosed.   A drug-eluting stent was successfully placed using a SYNERGY XD 2.50X48.   Post intervention, there is a 0% residual stenosis.   Successful IVUS guided angioplasty and drug-eluting stent placement to the left circumflex using 1 long stent.  Significant vessel size mismatch between the distal and the proximal area.  Distal segment was 2.5 mm in diameter in the proximal area was 3.5 mm in diameter.  The stent was postdilated distally with a 2.75 noncompliant balloon and proximally to 3.5 noncompliant balloon to the maximal stent ID which is 3.5 mm. OM2 has borderline disease proximally but is supplied by good flow from the Y graft from OM 3.   Recommendations: Continue dual antiplatelet therapy for at least 6 months. Aggressive treatment of risk factors. Given underlying chronic kidney disease, will hydrate overnight and recheck renal function in the morning. __________   [facility] 07/26/2021:   LPAV lesion is 100% stenosed.   Prox Cx to Mid Cx lesion is 90% stenosed.   Prox LAD to Mid LAD lesion is 95% stenosed.   1st Mrg lesion is 70% stenosed.   Origin to Prox  Graft lesion is 100% stenosed.   Prox RCA to Mid RCA lesion is 100% stenosed.   Origin to Prox Graft lesion is 100% stenosed.   Mid LAD to Dist LAD lesion is 80% stenosed.   Dist LAD lesion is 60% stenosed.   SVG graft was visualized by angiography.   LIMA and is normal in caliber.   The graft exhibits no disease.   The left ventricular systolic function is normal.   LV end diastolic pressure is mildly elevated.   The left ventricular ejection fraction is 55-65% by visual estimate.   1.  Significant underlying three-vessel coronary artery disease with patent LIMA to mid LAD and occluded SVG to distal LAD and SVG to OM1/OM 2.  RCA was not injected and known to be small nondominant and occluded in the midsegment. 2.  Normal LV systolic function mildly elevated left ventricular end-diastolic pressure.   Recommendations: Unfortunately, the LAD is significantly diseased after the LIMA anastomosis and likely not suitable for PCI given extension of disease to the apical LAD.  Left circumflex can be treated with PCI but at the bifurcation lesion will long segment of disease.  We will place the patient on clopidogrel and planned staged PCI at [facility]. __________   Eugenie Birks MPI 06/2021:   Abnormal, probably low risk pharmacologic myocardial perfusion stress test.   There is a small in size, moderate to severe, partially reversible defect involving the apical inferior and mid inferolateral segments most consistent with scar and peri-infarct ischemia, though an element of artifact cannot be excluded.   Left ventricular function is normal (LVEF 60-65%).   Attenuation correction CT demonstrates post CABG findings, coronary artery calcification, and aortic atherosclerosis.   Compared to the prior study of 12/07/2018, mid/apical inferior/inferolateral defect is new. __________   2D echo 09/2020: 1. Left ventricular ejection fraction, by estimation, is 60 to 65%. The  left ventricle has normal  function.  The left ventricle has no regional  wall motion abnormalities. Left ventricular diastolic parameters are  consistent with Grade I diastolic  dysfunction (impaired relaxation).   2. Right ventricular systolic function is normal. The right ventricular  size is normal. There is normal pulmonary artery systolic pressure. The  estimated right ventricular systolic pressure is 27.6 mmHg.   3. Left atrial size was mildly dilated.   4. The mitral valve is normal in structure. Mild mitral valve  regurgitation.   5. The aortic valve is normal in structure. Aortic valve regurgitation is  mild.  __________   Eugenie Birks MPI 11/2018: Pharmacological myocardial perfusion imaging study with no significant  ischemia Normal wall motion, EF estimated at 66% No EKG changes concerning for ischemia at peak stress or in recovery. Nonspecific T wave ABN on resting EKG V3 to V6 Low risk scan   EKG:  EKG is ordered today.  The EKG ordered today demonstrates NSR, 71 bpm, left axis deviation, poor R wave progression along the precordial leads, prior inferior infarct, nonspecific ST-T changes, consistent with prior tracing  Recent Labs: 01/29/2023: B Natriuretic Peptide 215.9; TSH 2.115 01/30/2023: ALT 15 01/31/2023: BUN 12; Creatinine, Ser 1.13; Hemoglobin 12.7; Platelets 173; Potassium 3.6; Sodium 135  Recent Lipid Panel    Component Value Date/Time   CHOL 90 05/04/2021 0401   CHOL 111 04/28/2021 0823   TRIG 56 05/04/2021 0401   HDL 19 (L) 05/04/2021 0401   HDL 35 (L) 04/28/2021 0823   CHOLHDL 4.7 05/04/2021 0401   VLDL 11 05/04/2021 0401   LDLCALC 60 05/04/2021 0401   LDLCALC 60 04/28/2021 0823    PHYSICAL EXAM:    VS:  BP 128/66 (BP Location: Left Arm, Patient Position: Sitting, Cuff Size: Normal)   Pulse 71   Ht 6' (1.829 m)   Wt 231 lb 3.2 oz (104.9 kg)   SpO2 96%   BMI 31.36 kg/m   BMI: Body mass index is 31.36 kg/m.  Physical Exam Vitals reviewed.  Constitutional:      Appearance: He is  well-developed.  HENT:     Head: Normocephalic and atraumatic.  Eyes:     General:        Right eye: No discharge.        Left eye: No discharge.  Neck:     Vascular: No JVD.  Cardiovascular:     Rate and Rhythm: Normal rate and regular rhythm.     Pulses:          Posterior tibial pulses are 2+ on the right side and 2+ on the left side.     Heart sounds: Normal heart sounds, S1 normal and S2 normal. Heart sounds not distant. No midsystolic click and no opening snap. No murmur heard.    No friction rub.  Pulmonary:     Effort: Pulmonary effort is normal. No respiratory distress.     Breath sounds: Normal breath sounds. No decreased breath sounds, wheezing or rales.  Chest:     Chest wall: No tenderness.  Abdominal:     General: There is no distension.  Musculoskeletal:     Cervical back: Normal range of motion.     Right lower leg: No edema.     Left lower leg: No edema.  Skin:    General: Skin is warm and dry.     Nails: There is no clubbing.  Neurological:     Mental Status: He is alert and oriented to person, place, and time.  Psychiatric:        Speech: Speech normal.        Behavior: Behavior normal.        Thought Content: Thought content normal.        Judgment: Judgment normal.     Wt Readings from Last 3 Encounters:  03/24/23 231 lb 3.2 oz (104.9 kg)  01/31/23 240 lb 11.9 oz (109.2 kg)  11/30/22 228 lb 11.2 oz (103.7 kg)     ASSESSMENT & PLAN:   CAD status post CABG status post PCI without angina: He is doing well and without symptoms concerning for angina or cardiac decompensation.  He remains on DAPT with aspirin and clopidogrel long-term, as long as tolerated, given prior CABG with subsequent PCI.  Continue aggressive risk factor modification and secondary prevention including carvedilol, atorvastatin, and Imdur.  No indication for further ischemic testing at this time.  HFpEF: Euvolemic and well compensated with NYHA class II symptoms.  Continue current  medical therapy including furosemide 20 mg Monday through Friday, Entresto, and carvedilol.  Defer addition of MRA or SGLT2 inhibitor given history of prior AKI and recent urinary tract infection.  HTN: Blood pressure is well-controlled in the office today.  Remains on carvedilol 25 mg twice daily, Imdur 30 mg and Entresto 24/26 mg twice daily.  HLD: LDL 57 in 11/2022.  Remains on atorvastatin 40 mg.    Disposition: F/u with Dr. Kirke Corin or an APP in 6 months.   Medication Adjustments/Labs and Tests Ordered: Current medicines are reviewed at length with the patient today.  Concerns regarding medicines are outlined above. Medication changes, Labs and Tests ordered today are summarized above and listed in the Patient Instructions accessible in Encounters.   Signed, Eula Listen, PA-C 03/24/2023 9:39 AM     [facility] [place] [address] [place], [place] [address] [phone]

## 2023-03-24 ENCOUNTER — Encounter: Payer: Self-pay | Admitting: Physician Assistant

## 2023-03-24 ENCOUNTER — Ambulatory Visit: Payer: Medicare PPO | Attending: Physician Assistant | Admitting: Physician Assistant

## 2023-03-24 VITALS — BP 128/66 | HR 71 | Ht 72.0 in | Wt 231.2 lb

## 2023-03-24 DIAGNOSIS — I10 Essential (primary) hypertension: Secondary | ICD-10-CM

## 2023-03-24 DIAGNOSIS — I5032 Chronic diastolic (congestive) heart failure: Secondary | ICD-10-CM

## 2023-03-24 DIAGNOSIS — E785 Hyperlipidemia, unspecified: Secondary | ICD-10-CM | POA: Diagnosis not present

## 2023-03-24 DIAGNOSIS — I25810 Atherosclerosis of coronary artery bypass graft(s) without angina pectoris: Secondary | ICD-10-CM | POA: Diagnosis not present

## 2023-03-24 NOTE — Patient Instructions (Signed)
Medication Instructions:  Your Physician recommend you continue on your current medication as directed.    *If you need a refill on your cardiac medications before your next appointment, please call your pharmacy*  Follow-Up: At [facility], you and your health needs are our priority.  As part of our continuing mission to provide you with exceptional heart care, we have created designated Provider Care Teams.  These Care Teams include your primary Cardiologist (physician) and Advanced Practice Providers (APPs -  Physician Assistants and Nurse Practitioners) who all work together to provide you with the care you need, when you need it.  We recommend signing up for the patient portal called "MyChart".  Sign up information is provided on this After Visit Summary.  MyChart is used to connect with patients for Virtual Visits (Telemedicine).  Patients are able to view lab/test results, encounter notes, upcoming appointments, etc.  Non-urgent messages can be sent to your provider as well.   To learn more about what you can do with MyChart, go to [email or web address].    Your next appointment:   6 month(s)  Provider:   You may see [name], MD or one of the following Advanced Practice Providers on your designated Care Team:   Eula Listen, [place]

## 2023-04-15 ENCOUNTER — Other Ambulatory Visit: Payer: Self-pay | Admitting: Physician Assistant

## 2023-05-03 ENCOUNTER — Other Ambulatory Visit: Payer: Self-pay | Admitting: Cardiovascular Disease

## 2023-06-25 ENCOUNTER — Other Ambulatory Visit: Payer: Self-pay | Admitting: Cardiovascular Disease

## 2023-08-10 ENCOUNTER — Telehealth: Payer: Self-pay | Admitting: Cardiovascular Disease

## 2023-08-10 NOTE — Telephone Encounter (Signed)
Patient came by office and dropped off [employer] PAF. Place in Pam Allen's box.

## 2023-08-10 NOTE — Telephone Encounter (Signed)
Patient in today to apply for assistance with his medication. He has his portion of the application and statement of his ss income. Advised that he will need to call and request letter of attestation and that I will submit his application. He verbalized understanding with no further questions at this time.

## 2023-08-14 NOTE — Telephone Encounter (Signed)
Application completed and now just pending provider signature.

## 2023-08-14 NOTE — Telephone Encounter (Signed)
Application completed. Patient must call and request letter of attestation in order to process application through company. Provided him with number and what he should ask for. He was appreciative. Closing encounter at this time.

## 2023-10-17 NOTE — Progress Notes (Unsigned)
 Cardiology Office Note    Date:  10/18/2023   ID:  Dale Fiorito., DOB 07-29-1945, MRN [id number]  PCP:  Westley Hammers, MD  Cardiologist:  Antionette Kirks, MD  Electrophysiologist:  None   Chief Complaint: Follow-up  History of Present Illness:   Dale Coomes. is a 78 y.o. male with history of CAD status post CABG in 12/2016 s/p PCI/DES to the LCx on 08/11/2021, HFpEF, HTN, and HLD who presents for follow up of CAD and HFpEF.    He presented in 2018 with unstable angina with LHC showing severe three-vessel CAD.  He subsequently underwent four-vessel CABG in 12/2016 with LIMA to mid LAD, SVG to distal LAD, jump graft with SVG to OM1 and OM2.  Carotid artery ultrasound prior to surgery showed no significant disease.  Echo showed normal LV systolic function with no significant valvular abnormalities.  Lexiscan  MPI on 07/13/2021 showed a small in size, moderate in severity, partially reversible defect involving the apical inferior and mid inferolateral segments most consistent with scar and peri-infarct ischemia.  Compared to prior study in 2020, the mid/apical inferior, inferolateral defect was new. LVEF normal. Due to persistent symptoms, he underwent LHC on 07/26/2021, that showed significant underlying 3-vessel CAD with patent LIMA to mid LAD, and occluded SVG to distal LAD and SVG to OM1/OM2. The RCA was not injected and known to be a small nondominant vessel and occluded in the mid segment.  There was normal LVSF and mildly elevated LVEDP. The LAD was significantly diseased after the LIMA anastomosis and likely not suitable for PCI given the extension of disease to the apical LAD. The LCx could be treated with PCI, but at the bifurcation lesion would require a long segment stenting with recommendation for staged PCI at [facility].     He was admitted to [facility] in 07/2021 with dyspnea concerning for anginal equivalent along with volume depletion. Imdur  was added and ultimately, the  decision was made to move forward with planned LHC with Dr. Alvenia Aus at [facility] the following week.    He underwent planned repeat LHC at [facility] on 08/11/2021 with successful IVUS-guide PCI/DES to the LCx using 1 long stent (2.5x48 mm). There was significant vessel size mismatch between the distal and proximal area with the distal segment 2.5 mm in diameter and the proximal area 3.5 mm in diameter. The stent was post-dilated to the maximal stent ID, 3.5 mm. The OM2 had borderline disease proximally, though was supplied by good flow from the Y graft from OM3.     He was seen in the ED on 09/22/2021 with intermittent ankle and feet swelling.  BNP 131.  CRP and sed rate elevated.  X-ray revealed subacute to chronic nondisplaced fracture of the distal tip of both medial malleoli along with osteoarthritis noted bilaterally.  Symptoms were felt to be orthopedic in etiology.   He was seen in the ED on 11/04/2021 with shortness of breath and intermittent lower extremity swelling.  High-sensitivity troponin negative.  BNP 180.  He was given IV Lasix  in the ED with recommendation for outpatient follow-up.   He was seen in the office on 11/25/2021, and continued to note intermittent bilateral ankle/pedal edema as well as bilateral knee and foot pain that were attributed to osteoarthritis.  His weight was down 4 pounds when compared to his prior visit.  Amlodipine  was discontinued and he was initiated on Entresto .   He was admitted to the hospital in 01/2023 with  sepsis from UTI.  High-sensitivity troponin negative to.  D-dimer normal.  BNP 215.  He was last seen in the office in 02/2019 for and was doing well from a cardiac perspective, taking furosemide  Monday through Friday.  No changes in pharmacotherapy were pursued at that time.  He comes in doing well from a cardiac perspective and is without symptoms of angina or cardiac decompensation.  He does note some brief split-second shortness of breath if he is doing  activities such as washing his car.  He was at home for a couple seconds and get back up without any further shortness of breath.  He denies any other exertional shortness of breath such as with walking up and down stairs.  No frank angina.  No dizziness, presyncope, syncope.  No lower extremity swelling.  Weight is stable by his home scale ranging from 220 to 230 pounds.  He is adherent and tolerating cardiac medications, taking furosemide  Monday through Friday.  Follows a low-sodium diet and does not add salt to food.  Drinking less than 2 L liquid daily.   Labs independently reviewed: 01/2023 - potassium 3.6, BUN 12, serum creatinine 1.13, Hgb 12.7, PLT 173, albumin  2.9, AST/ALT normal, A1c 7.0, TSH normal, BNP 215 11/2022 - TC 113, TG 92, HDL 38, LDL 57  Past Medical History:  Diagnosis Date   (HFpEF) heart failure with preserved ejection fraction (HCC)    a.) TTE 01/11/2017: EF 65-70%; b.) TTE 10/15/2020: EF 60-65%. mild LAE, mild MR/AR, G1DD   Anginal pain (HCC)    Aortic atherosclerosis (HCC)    CAD (coronary artery disease)    a.) LHC 01/09/17: LAD 95p, 47m, 60/90d, LCx 80p/m, 131m, OM1 2m, OM2 90, RCA 99p; b.) 12/2016 CABG x 4 (LIMA-LAD, VG-dLAD, VG-OM1-OM2); c.) 11/2018 MV: EF 66%, no isch; d.) LHC/PCI 07/26/21: 100 LPAV, 90 p-mLCx (2.5 x 28 mm Synergy XD DES), 85 p-mLAD, 70 OM1, 80 m-dLAD, 50 dLAD; LIMA-LAD pat, SVG-dLAD and SVG-OM1/2 occ; e.) MV 07/13/2021: EF 60-65%, sm mod-sev part rev apical inf and mid inflat defect   Cardiac murmur    a.) grade II/VI early peaking systolic in aortic area   Essential hypertension    GERD (gastroesophageal reflux disease)    High cholesterol    Left carotid bruit    Osteoarthritis    Wears dentures    partial upper    Past Surgical History:  Procedure Laterality Date   CATARACT EXTRACTION W/PHACO Left 06/12/2020   Procedure: CATARACT EXTRACTION PHACO AND INTRAOCULAR LENS PLACEMENT (IOC) LEFT 4.36 00:51.9 8.4%;  Surgeon: Annell Kidney, MD;   Location: [facility];  Service: Ophthalmology;  Laterality: Left;   CATARACT EXTRACTION W/PHACO Right 11/30/2022   Procedure: CATARACT EXTRACTION PHACO AND INTRAOCULAR LENS PLACEMENT (IOC) RIGHT;  Surgeon: Annell Kidney, MD;  Location: [facility];  Service: Ophthalmology;  Laterality: Right;  4.74 0:33.4   COLONOSCOPY WITH PROPOFOL  N/A 11/09/2016   Procedure: COLONOSCOPY WITH PROPOFOL ;  Surgeon: Marshall Skeeter, MD;  Location: [facility];  Service: Endoscopy;  Laterality: N/A;   COLONOSCOPY WITH PROPOFOL  N/A 11/29/2019   Procedure: COLONOSCOPY WITH PROPOFOL ;  Surgeon: Marshall Skeeter, MD;  Location: [facility];  Service: Endoscopy;  Laterality: N/A;   CORONARY ARTERY BYPASS GRAFT N/A 01/12/2017   Procedure: CORONARY ARTERY BYPASS GRAFTING x 4 (LIMA to mLAD, SVG to dLAD, sequential SVG to OM1-OM2, EVH via left thigh and leg); Surgeon: Gardenia Jump, MD;  Location: [facility] OR;  Service: Open Heart Surgery;  Laterality: N/A;  CORONARY STENT INTERVENTION N/A 08/11/2021   Procedure: CORONARY STENT INTERVENTION;  Surgeon: Wenona Hamilton, MD;  Location: [facility];  Service: Cardiovascular;  Laterality: N/A;   CORONARY ULTRASOUND/IVUS N/A 08/11/2021   Procedure: Intravascular Ultrasound/IVUS;  Surgeon: Wenona Hamilton, MD;  Location: [facility] CV [facility];  Service: Cardiovascular;  Laterality: N/A;   ECTROPION REPAIR Bilateral 06/01/2022   INTRAOPERATIVE TRANSESOPHAGEAL ECHOCARDIOGRAM N/A 01/12/2017   Procedure: INTRAOPERATIVE TRANSESOPHAGEAL ECHOCARDIOGRAM;  Surgeon: Gardenia Jump, MD;  Location: [facility] OR;  Service: Open Heart Surgery;  Laterality: N/A;   LEFT HEART CATH AND CORONARY ANGIOGRAPHY Left 01/09/2017   Procedure: Left Heart Cath and Coronary Angiography;  Surgeon: Wenona Hamilton, MD;  Location: [facility];  Service: Cardiovascular;  Laterality: Left;   LEFT HEART CATH AND CORS/GRAFTS ANGIOGRAPHY N/A 07/26/2021   Procedure: LEFT  HEART CATH AND CORS/GRAFTS ANGIOGRAPHY;  Surgeon: Wenona Hamilton, MD;  Location: [facility] CV LAB;  Service: Cardiovascular;  Laterality: N/A;   TOTAL KNEE ARTHROPLASTY Right 01/18/2021   Procedure: TOTAL KNEE ARTHROPLASTY;  Surgeon: Jerlyn Moons, MD;  Location: [facility] ORS;  Service: Orthopedics;  Laterality: Right;   TOTAL KNEE ARTHROPLASTY Left 07/19/2022   Procedure: TOTAL KNEE ARTHROPLASTY;  Surgeon: Jerlyn Moons, MD;  Location: [facility];  Service: Orthopedics;  Laterality: Left;    Current Medications: Current Meds  Medication Sig   acetaminophen  (TYLENOL ) 325 MG tablet Take 2 tablets (650 mg total) by mouth every 4 (four) hours as needed for headache or mild pain.   aspirin  81 MG chewable tablet Chew 81 mg by mouth daily.   atorvastatin  (LIPITOR) 40 MG tablet Take 40 mg by mouth daily.   carvedilol  (COREG ) 25 MG tablet TAKE 1 TABLET TWICE DAILY   clopidogrel  (PLAVIX ) 75 MG tablet TAKE 1 TABLET BY MOUTH EVERY DAY   furosemide  (LASIX ) 20 MG tablet Take 20 mg by mouth every other day. Mon - Friday only.   isosorbide  mononitrate (IMDUR ) 30 MG 24 hr tablet Take 1 tablet (30 mg total) by mouth daily.   loratadine  (CLARITIN ) 10 MG tablet Take 10 mg by mouth daily as needed for allergies. PRN   methocarbamol  (ROBAXIN ) 500 MG tablet Take 1 tablet by mouth every 8 (eight) hours as needed. PRN   Multiple Vitamin (MULTIVITAMIN) capsule Take 1 capsule by mouth daily.   pantoprazole  (PROTONIX ) 40 MG tablet TAKE 1 TABLET EVERY DAY   sacubitril -valsartan  (ENTRESTO ) 24-26 MG Take 1 tablet by mouth 2 (two) times daily.    Allergies:   Patient has no known allergies.   Social History   Socioeconomic History   Marital status: Divorced    Spouse name: Not on file   Number of children: 3   Years of education: Not on file   Highest education level: Not on file  Occupational History   Not on file  Tobacco Use   Smoking status: Never   Smokeless tobacco: Never  Vaping Use   Vaping  status: Never Used  Substance and Sexual Activity   Alcohol  use: No   Drug use: No   Sexual activity: Not Currently  Other Topics Concern   Not on file  Social History Narrative   Not on file   Social Drivers of Health   Financial Resource Strain: Not on file  Food Insecurity: No Food Insecurity (01/29/2023)   Hunger Vital Sign    Worried About Running Out of Food in the Last Year: Never true    Ran Out  of Food in the Last Year: Never true  Transportation Needs: No Transportation Needs (01/29/2023)   PRAPARE - Administrator, Civil Service (Medical): No    Lack of Transportation (Non-Medical): No  Physical Activity: Sufficiently Active (12/23/2020)   Exercise Vital Sign    Days of Exercise per Week: 7 days    Minutes of Exercise per Session: 120 min  Stress: Not on file  Social Connections: Not on file     Family History:  The patient's family history includes Colon cancer in his brother; Heart attack in his mother.  ROS:   12-point review of systems is negative unless otherwise noted in the HPI.   EKGs/Labs/Other Studies Reviewed:    Studies reviewed were summarized above. The additional studies were reviewed today:  LHC 08/11/2021:   LPAV lesion is 100% stenosed.   Prox LAD to Mid LAD lesion is 95% stenosed.   Mid Cx to Dist Cx lesion is 95% stenosed.   2nd Mrg lesion is 60% stenosed.   A drug-eluting stent was successfully placed using a SYNERGY XD 2.50X48.   Post intervention, there is a 0% residual stenosis.   Successful IVUS guided angioplasty and drug-eluting stent placement to the left circumflex using 1 long stent.  Significant vessel size mismatch between the distal and the proximal area.  Distal segment was 2.5 mm in diameter in the proximal area was 3.5 mm in diameter.  The stent was postdilated distally with a 2.75 noncompliant balloon and proximally to 3.5 noncompliant balloon to the maximal stent ID which is 3.5 mm. OM2 has borderline disease  proximally but is supplied by good flow from the Y graft from OM 3.   Recommendations: Continue dual antiplatelet therapy for at least 6 months. Aggressive treatment of risk factors. Given underlying chronic kidney disease, will hydrate overnight and recheck renal function in the morning. __________   [facility] 07/26/2021:   LPAV lesion is 100% stenosed.   Prox Cx to Mid Cx lesion is 90% stenosed.   Prox LAD to Mid LAD lesion is 95% stenosed.   1st Mrg lesion is 70% stenosed.   Origin to Prox Graft lesion is 100% stenosed.   Prox RCA to Mid RCA lesion is 100% stenosed.   Origin to Prox Graft lesion is 100% stenosed.   Mid LAD to Dist LAD lesion is 80% stenosed.   Dist LAD lesion is 60% stenosed.   SVG graft was visualized by angiography.   LIMA and is normal in caliber.   The graft exhibits no disease.   The left ventricular systolic function is normal.   LV end diastolic pressure is mildly elevated.   The left ventricular ejection fraction is 55-65% by visual estimate.   1.  Significant underlying three-vessel coronary artery disease with patent LIMA to mid LAD and occluded SVG to distal LAD and SVG to OM1/OM 2.  RCA was not injected and known to be small nondominant and occluded in the midsegment. 2.  Normal LV systolic function mildly elevated left ventricular end-diastolic pressure.   Recommendations: Unfortunately, the LAD is significantly diseased after the LIMA anastomosis and likely not suitable for PCI given extension of disease to the apical LAD.  Left circumflex can be treated with PCI but at the bifurcation lesion will long segment of disease.  We will place the patient on clopidogrel  and planned staged PCI at [facility]. __________   Lexiscan  MPI 06/2021:   Abnormal, probably low risk pharmacologic myocardial perfusion stress test.  There is a small in size, moderate to severe, partially reversible defect involving the apical inferior and mid inferolateral segments most  consistent with scar and peri-infarct ischemia, though an element of artifact cannot be excluded.   Left ventricular function is normal (LVEF 60-65%).   Attenuation correction CT demonstrates post CABG findings, coronary artery calcification, and aortic atherosclerosis.   Compared to the prior study of 12/07/2018, mid/apical inferior/inferolateral defect is new. __________   2D echo 09/2020: 1. Left ventricular ejection fraction, by estimation, is 60 to 65%. The  left ventricle has normal function. The left ventricle has no regional  wall motion abnormalities. Left ventricular diastolic parameters are  consistent with Grade I diastolic  dysfunction (impaired relaxation).   2. Right ventricular systolic function is normal. The right ventricular  size is normal. There is normal pulmonary artery systolic pressure. The  estimated right ventricular systolic pressure is 27.6 mmHg.   3. Left atrial size was mildly dilated.   4. The mitral valve is normal in structure. Mild mitral valve  regurgitation.   5. The aortic valve is normal in structure. Aortic valve regurgitation is  mild.  __________   Lexiscan  MPI 11/2018: Pharmacological myocardial perfusion imaging study with no significant  ischemia Normal wall motion, EF estimated at 66% No EKG changes concerning for ischemia at peak stress or in recovery. Nonspecific T wave ABN on resting EKG V3 to V6 Low risk scan   EKG:  EKG is not ordered today.    Recent Labs: 01/29/2023: B Natriuretic Peptide 215.9; TSH 2.115 01/30/2023: ALT 15 01/31/2023: BUN 12; Creatinine, Ser 1.13; Hemoglobin 12.7; Platelets 173; Potassium 3.6; Sodium 135  Recent Lipid Panel    Component Value Date/Time   CHOL 90 05/04/2021 0401   CHOL 111 04/28/2021 0823   TRIG 56 05/04/2021 0401   HDL 19 (L) 05/04/2021 0401   HDL 35 (L) 04/28/2021 0823   CHOLHDL 4.7 05/04/2021 0401   VLDL 11 05/04/2021 0401   LDLCALC 60 05/04/2021 0401   LDLCALC 60 04/28/2021 0823     PHYSICAL EXAM:    VS:  BP 112/64 (BP Location: Left Arm, Patient Position: Sitting, Cuff Size: Normal)   Pulse 67   Ht 6' (1.829 m)   Wt 234 lb 12.8 oz (106.5 kg)   SpO2 94%   BMI 31.84 kg/m   BMI: Body mass index is 31.84 kg/m.  Physical Exam Vitals reviewed.  Constitutional:      Appearance: He is well-developed.  HENT:     Head: Normocephalic and atraumatic.  Eyes:     General:        Right eye: No discharge.        Left eye: No discharge.  Cardiovascular:     Rate and Rhythm: Normal rate and regular rhythm.     Pulses:          Posterior tibial pulses are 2+ on the right side and 2+ on the left side.     Heart sounds: S1 normal and S2 normal. Heart sounds not distant. No midsystolic click and no opening snap. Murmur heard.     Systolic murmur is present with a grade of 1/6 at the upper left sternal border.     No friction rub.  Pulmonary:     Effort: Pulmonary effort is normal. No respiratory distress.     Breath sounds: Normal breath sounds. No decreased breath sounds, wheezing, rhonchi or rales.  Chest:     Chest wall: No tenderness.  Musculoskeletal:  Cervical back: Normal range of motion.     Right lower leg: No edema.     Left lower leg: No edema.  Skin:    General: Skin is warm and dry.     Nails: There is no clubbing.  Neurological:     Mental Status: He is alert and oriented to person, place, and time.  Psychiatric:        Speech: Speech normal.        Behavior: Behavior normal.        Thought Content: Thought content normal.        Judgment: Judgment normal.     Wt Readings from Last 3 Encounters:  10/18/23 234 lb 12.8 oz (106.5 kg)  03/24/23 231 lb 3.2 oz (104.9 kg)  01/31/23 240 lb 11.9 oz (109.2 kg)     ASSESSMENT & PLAN:   CAD status post CABG status post PCI without angina: He is doing well and without symptoms concerning for angina or cardiac decompensation.  He remains on DAPT with aspirin  81 mg and clopidogrel  75 mg long-term, as  long as tolerated, given prior CABG with subsequent PCI.  Continue aggressive risk factor modification and secondary prevention including carvedilol , atorvastatin , and Imdur .  No indication for further ischemic testing at this time.  HFpEF: Euvolemic and well compensated with NYHA class II symptoms.  He does note some brief shortness of breath with activities such as watching his car that improves after sitting down for a couple of seconds.  He is able to walk up and down stairs without any symptoms of dyspnea or angina.  Given brief dyspnea and in the context of murmur noted on exam we will obtain echo.  He otherwise remains on furosemide  20 mg Monday through Friday, Entresto , and carvedilol .  Defer addition of MRA with prior AKI and SGLT2 inhibitor with prior UTI.  Check CMP and CBC.  Murmur: Echo from 09/2020 with mild mitral regurgitation.  Obtain echo.  HTN: Blood pressure is well-controlled in the office today.  Continue pharmacotherapy as outlined above.  HLD: LDL 57 in 11/2022.  He remains on atorvastatin  40 mg.  Check CMP and lipid.  Currently fasting.    Disposition: F/u with Dr. Alvenia Aus or an APP in 6 months.   Medication Adjustments/Labs and Tests Ordered: Current medicines are reviewed at length with the patient today.  Concerns regarding medicines are outlined above. Medication changes, Labs and Tests ordered today are summarized above and listed in the Patient Instructions accessible in Encounters.   Signed, Varney Gentleman, PA-C 10/18/2023 8:59 AM     [facility] [place] [address] [place], [place] [address] [phone]

## 2023-10-18 ENCOUNTER — Encounter: Payer: Self-pay | Admitting: Physician Assistant

## 2023-10-18 ENCOUNTER — Ambulatory Visit: Attending: Physician Assistant | Admitting: Physician Assistant

## 2023-10-18 VITALS — BP 112/64 | HR 67 | Ht 72.0 in | Wt 234.8 lb

## 2023-10-18 DIAGNOSIS — I5032 Chronic diastolic (congestive) heart failure: Secondary | ICD-10-CM | POA: Diagnosis not present

## 2023-10-18 DIAGNOSIS — Z79899 Other long term (current) drug therapy: Secondary | ICD-10-CM

## 2023-10-18 DIAGNOSIS — I25810 Atherosclerosis of coronary artery bypass graft(s) without angina pectoris: Secondary | ICD-10-CM

## 2023-10-18 DIAGNOSIS — I10 Essential (primary) hypertension: Secondary | ICD-10-CM

## 2023-10-18 DIAGNOSIS — R011 Cardiac murmur, unspecified: Secondary | ICD-10-CM

## 2023-10-18 DIAGNOSIS — R0602 Shortness of breath: Secondary | ICD-10-CM

## 2023-10-18 DIAGNOSIS — E785 Hyperlipidemia, unspecified: Secondary | ICD-10-CM

## 2023-10-18 NOTE — Patient Instructions (Signed)
 Medication Instructions:  Your Physician recommend you continue on your current medication as directed.    *If you need a refill on your cardiac medications before your next appointment, please call your pharmacy*  Lab Work: Your provider would like for you to have following labs drawn today CBC, CMeT, and Lipid panel.   If you have labs (blood work) drawn today and your tests are completely normal, you will receive your results only by: MyChart Message (if you have MyChart) OR A paper copy in the mail If you have any lab test that is abnormal or we need to change your treatment, we will call you to review the results.  Testing/Procedures: Your physician has requested that you have an echocardiogram. Echocardiography is a painless test that uses sound waves to create images of your heart. It provides your doctor with information about the size and shape of your heart and how well your heart's chambers and valves are working.   You may receive an ultrasound enhancing agent through an IV if needed to better visualize your heart during the echo. This procedure takes approximately one hour.  There are no restrictions for this procedure.  This will take place at [address] [facility] [address] [facility]) [address], [place] [address]  Please note: We ask at that you not bring children with you during ultrasound (echo/ vascular) testing. Due to room size and safety concerns, children are not allowed in the ultrasound rooms during exams. Our front office staff cannot provide observation of children in our lobby area while testing is being conducted. An adult accompanying a patient to their appointment will only be allowed in the ultrasound room at the discretion of the ultrasound technician under special circumstances. We apologize for any inconvenience.  Follow-Up: At [facility], you and your health needs are our priority.  As part of our continuing mission to provide you with exceptional heart  care, our providers are all part of one team.  This team includes your primary Cardiologist (physician) and Advanced Practice Providers or APPs (Physician Assistants and Nurse Practitioners) who all work together to provide you with the care you need, when you need it.  Your next appointment:   6 month(s)  Provider:   You may see Antionette Kirks, MD or Varney Gentleman, PA-C  We recommend signing up for the patient portal called "MyChart".  Sign up information is provided on this After Visit Summary.  MyChart is used to connect with patients for Virtual Visits (Telemedicine).  Patients are able to view lab/test results, encounter notes, upcoming appointments, etc.  Non-urgent messages can be sent to your provider as well.   To learn more about what you can do with MyChart, go to [email or web address].

## 2023-10-19 LAB — COMPREHENSIVE METABOLIC PANEL WITH GFR
ALT: 14 IU/L (ref 0–44)
AST: 13 IU/L (ref 0–40)
Albumin: 3.8 g/dL (ref 3.8–4.8)
Alkaline Phosphatase: 115 IU/L (ref 44–121)
BUN/Creatinine Ratio: 11 (ref 10–24)
BUN: 14 mg/dL (ref 8–27)
Bilirubin Total: 0.6 mg/dL (ref 0.0–1.2)
CO2: 24 mmol/L (ref 20–29)
Calcium: 8.7 mg/dL (ref 8.6–10.2)
Chloride: 104 mmol/L (ref 96–106)
Creatinine, Ser: 1.33 mg/dL — ABNORMAL HIGH (ref 0.76–1.27)
Globulin, Total: 2.9 g/dL (ref 1.5–4.5)
Glucose: 123 mg/dL — ABNORMAL HIGH (ref 70–99)
Potassium: 4.3 mmol/L (ref 3.5–5.2)
Sodium: 140 mmol/L (ref 134–144)
Total Protein: 6.7 g/dL (ref 6.0–8.5)
eGFR: 56 mL/min/{1.73_m2} — ABNORMAL LOW (ref >59)

## 2023-10-19 LAB — LIPID PANEL
Chol/HDL Ratio: 3.2 ratio (ref 0.0–5.0)
Cholesterol, Total: 108 mg/dL (ref 100–199)
HDL: 34 mg/dL — ABNORMAL LOW (ref >39)
LDL Chol Calc (NIH): 57 mg/dL (ref 0–99)
Triglycerides: 83 mg/dL (ref 0–149)
VLDL Cholesterol Cal: 17 mg/dL (ref 5–40)

## 2023-10-19 LAB — CBC
Hematocrit: 41.9 % (ref 37.5–51.0)
Hemoglobin: 13.7 g/dL (ref 13.0–17.7)
MCH: 26.8 pg (ref 26.6–33.0)
MCHC: 32.7 g/dL (ref 31.5–35.7)
MCV: 82 fL (ref 79–97)
Platelets: 189 10*3/uL (ref 150–450)
RBC: 5.11 x10E6/uL (ref 4.14–5.80)
RDW: 14.5 % (ref 11.6–15.4)
WBC: 7.2 10*3/uL (ref 3.4–10.8)

## 2023-11-23 ENCOUNTER — Ambulatory Visit: Attending: Physician Assistant

## 2023-11-23 DIAGNOSIS — R0602 Shortness of breath: Secondary | ICD-10-CM

## 2023-11-23 DIAGNOSIS — I5032 Chronic diastolic (congestive) heart failure: Secondary | ICD-10-CM

## 2023-11-23 LAB — ECHOCARDIOGRAM COMPLETE
Area-P 1/2: 2.99 cm2
S' Lateral: 2.47 cm

## 2023-11-24 ENCOUNTER — Ambulatory Visit: Payer: Self-pay | Admitting: Physician Assistant

## 2023-11-28 ENCOUNTER — Telehealth: Payer: Self-pay | Admitting: Physician Assistant

## 2023-11-28 MED ORDER — FUROSEMIDE 20 MG PO TABS: 20.0000 mg | ORAL_TABLET | ORAL | 3 refills | Status: AC

## 2023-11-28 NOTE — Telephone Encounter (Signed)
 Pt's medication was sent to pt's pharmacy as requested. Confirmation received.

## 2023-11-28 NOTE — Telephone Encounter (Signed)
*  STAT* If patient is at the pharmacy, call can be transferred to refill team.   1. Which medications need to be refilled? (please list name of each medication and dose if known)   furosemide  (LASIX ) 20 MG tablet     4. Which pharmacy/location (including street and city if local pharmacy) is medication to be sent to? [id number] - [place], [place] - [address]     5. Do they need a 30 day or 90 day supply? 90

## 2023-11-29 ENCOUNTER — Emergency Department
Admission: EM | Admit: 2023-11-29 | Discharge: 2023-11-29 | Disposition: A | Discharge status: Home / Self Care | Attending: Emergency Medicine | Admitting: Emergency Medicine

## 2023-11-29 ENCOUNTER — Emergency Department

## 2023-11-29 ENCOUNTER — Other Ambulatory Visit: Payer: Self-pay

## 2023-11-29 DIAGNOSIS — I110 Hypertensive heart disease with heart failure: Secondary | ICD-10-CM | POA: Diagnosis not present

## 2023-11-29 DIAGNOSIS — N3000 Acute cystitis without hematuria: Secondary | ICD-10-CM | POA: Insufficient documentation

## 2023-11-29 DIAGNOSIS — I509 Heart failure, unspecified: Secondary | ICD-10-CM | POA: Insufficient documentation

## 2023-11-29 DIAGNOSIS — J189 Pneumonia, unspecified organism: Secondary | ICD-10-CM | POA: Diagnosis not present

## 2023-11-29 DIAGNOSIS — I2510 Atherosclerotic heart disease of native coronary artery without angina pectoris: Secondary | ICD-10-CM | POA: Diagnosis not present

## 2023-11-29 DIAGNOSIS — R300 Dysuria: Secondary | ICD-10-CM | POA: Diagnosis present

## 2023-11-29 LAB — URINALYSIS, ROUTINE W REFLEX MICROSCOPIC
Bilirubin Urine: NEGATIVE
Glucose, UA: NEGATIVE mg/dL
Ketones, ur: NEGATIVE mg/dL
Nitrite: NEGATIVE
Protein, ur: NEGATIVE mg/dL
RBC / HPF: >50 RBC/hpf (ref 0–5)
Specific Gravity, Urine: 1.012 (ref 1.005–1.030)
Squamous Epithelial / HPF: 0 /HPF (ref 0–5)
WBC, UA: >50 WBC/hpf (ref 0–5)
pH: 6 (ref 5.0–8.0)

## 2023-11-29 LAB — CBC
HCT: 40.7 % (ref 39.0–52.0)
Hemoglobin: 13.5 g/dL (ref 13.0–17.0)
MCH: 27.2 pg (ref 26.0–34.0)
MCHC: 33.2 g/dL (ref 30.0–36.0)
MCV: 81.9 fL (ref 80.0–100.0)
Platelets: 204 10*3/uL (ref 150–400)
RBC: 4.97 MIL/uL (ref 4.22–5.81)
RDW: 15.4 % (ref 11.5–15.5)
WBC: 18.4 10*3/uL — ABNORMAL HIGH (ref 4.0–10.5)
nRBC: 0 % (ref 0.0–0.2)

## 2023-11-29 LAB — BASIC METABOLIC PANEL WITH GFR
Anion gap: 9 (ref 5–15)
BUN: 14 mg/dL (ref 8–23)
CO2: 24 mmol/L (ref 22–32)
Calcium: 8.7 mg/dL — ABNORMAL LOW (ref 8.9–10.3)
Chloride: 103 mmol/L (ref 98–111)
Creatinine, Ser: 1.29 mg/dL — ABNORMAL HIGH (ref 0.61–1.24)
GFR, Estimated: 58 mL/min — ABNORMAL LOW (ref >60)
Glucose, Bld: 125 mg/dL — ABNORMAL HIGH (ref 70–99)
Potassium: 3.9 mmol/L (ref 3.5–5.1)
Sodium: 136 mmol/L (ref 135–145)

## 2023-11-29 LAB — TROPONIN I (HIGH SENSITIVITY): Troponin I (High Sensitivity): 10 ng/L (ref <18)

## 2023-11-29 MED ORDER — LEVOFLOXACIN 500 MG PO TABS
750.0000 mg | ORAL_TABLET | Freq: Once | ORAL | Status: AC
  Administered 2023-11-29: 750 mg via ORAL
  Filled 2023-11-29: qty 2

## 2023-11-29 MED ORDER — LEVOFLOXACIN 750 MG PO TABS: 750.0000 mg | ORAL_TABLET | Freq: Every day | ORAL | 0 refills | Status: AC

## 2023-11-29 NOTE — ED Triage Notes (Signed)
 Pt arrives with c/o medication reaction that started a few days ago. Per pt, he took Nugenix pill a few days ago and since then he has had the "shakes", palpitation, lightheadedness, and urinary frequency. Per pt, the medication is an "enhancing" medication. Pt denies SOB.

## 2023-11-29 NOTE — Discharge Instructions (Addendum)
 Take levofloxacin antibiotic for the full 7-day course as prescribed.  Discontinue the use of your NuGenex supplement  Thank you for choosing us  for your health care today!  Please see your primary doctor this week for a follow up appointment.   If you have any new, worsening, or unexpected symptoms call your doctor right away or come back to the emergency department for reevaluation.  It was my pleasure to care for you today.   Arron Large Margery Sheets, MD

## 2023-11-29 NOTE — ED Provider Notes (Signed)
 [facility] Provider Note    Event Date/Time   First MD Initiated Contact with Patient 11/29/23 2311     (approximate)   History   Medication Reaction   HPI  Dale Cook. is a 78 y.o. male   Past medical history of hypertension, CAD, heart failure with preserved ejection fraction, here with feeling unwell for the last couple of days including shaking chills, sense of heart racing, lightheadedness, urinary frequency and dysuria.  He notes that he started a new supplement in the last few days as well Nugenix.  He has developed a nonproductive cough for the last 3 days as well.  He denies any pain in the chest, difficulty breathing, pain in the abdomen nausea vomiting diarrhea.   External Medical Documents Reviewed: Cardiology note from 10/18/2023      Physical Exam   Triage Vital Signs: ED Triage Vitals  Encounter Vitals Group     BP 11/29/23 2052 (!) 113/99     Systolic BP Percentile --      Diastolic BP Percentile --      Pulse Rate 11/29/23 2052 78     Resp 11/29/23 2052 16     Temp 11/29/23 2052 98 F (36.7 C)     Temp Source 11/29/23 2052 Oral     SpO2 11/29/23 2052 98 %     Weight 11/29/23 2050 234 lb (106.1 kg)     Height --      Head Circumference --      Peak Flow --      Pain Score 11/29/23 2050 0     Pain Loc --      Pain Education --      Exclude from Growth Chart --     Most recent vital signs: Vitals:   11/29/23 2052  BP: (!) 113/99  Pulse: 78  Resp: 16  Temp: 98 F (36.7 C)  SpO2: 98%    General: Awake, no distress.  CV:  Good peripheral perfusion.  Resp:  Normal effort.  Abd:  No distention.  Other:  Awake alert pleasant gentleman in no acute distress.  Vital signs reviewed and unremarkable.  Breathing comfortably on room air.  Clear lungs normal rate and rhythm on auscultation of the heart.  Appears euvolemic and nontoxic overall.  Soft benign abdominal exam deep palpation all quadrants without  rigidity or guarding.   ED Results / Procedures / Treatments   Labs (all labs ordered are listed, but only abnormal results are displayed) Labs Reviewed  BASIC METABOLIC PANEL WITH GFR - Abnormal; Notable for the following components:      Result Value   Glucose, Bld 125 (*)    Creatinine, Ser 1.29 (*)    Calcium  8.7 (*)    GFR, Estimated 58 (*)    All other components within normal limits  CBC - Abnormal; Notable for the following components:   WBC 18.4 (*)    All other components within normal limits  URINALYSIS, ROUTINE W REFLEX MICROSCOPIC - Abnormal; Notable for the following components:   Color, Urine YELLOW (*)    APPearance CLOUDY (*)    Hgb urine dipstick LARGE (*)    Leukocytes,Ua LARGE (*)    Bacteria, UA RARE (*)    All other components within normal limits  URINE CULTURE  TROPONIN I (HIGH SENSITIVITY)     I ordered and reviewed the above labs they are notable for white blood cell count is elevated 18.  Negative  initial troponin.  Bacteria inflammatory changes in urine.  EKG  ED ECG REPORT I, Buell Carmin, the attending physician, personally viewed and interpreted this ECG.   Date: 11/29/2023  EKG Time: 2051  Rate: 79  Rhythm: sinus w pvc  Axis: nl  Intervals:nl  ST&T Change: no stemi    RADIOLOGY I independently reviewed and interpreted chest x-ray and I see no obvious focality or pneumothorax I also reviewed radiologist's formal read.   PROCEDURES:  Critical Care performed: No  Procedures   MEDICATIONS ORDERED IN ED: Medications  levofloxacin (LEVAQUIN) tablet 750 mg (has no administration in time range)    IMPRESSION / MDM / ASSESSMENT AND PLAN / ED COURSE  I reviewed the triage vital signs and the nursing notes.                                Patient's presentation is most consistent with acute presentation with potential threat to life or bodily function.  Differential diagnosis includes, but is not limited to, urine infection,  respiratory infection, electrolyte derangements, considered but less likely sepsis, ACS, PE,   The patient is on the cardiac monitor to evaluate for evidence of arrhythmia and/or significant heart rate changes.  MDM:    He has urinary symptoms and a dry cough over the last several days and has feeling chills, has leukocytosis and evidence of infection both on urinalysis and on chest x-ray.  I have given a first dose of levofloxacin which should help cover for both infectious etiologies.  He looks nontoxic doubt sepsis, looks well for outpatient treatment.  He denies chest pain doubt cardiopulmonary emergencies like ACS or PE.   He thinks his symptoms may be attributed to his new supplement.  Advised that he consult with his doctor before taking any new supplements and discontinue this in the meantime.  I considered hospitalization but given his overall well appearance, normal vital signs, nontoxic appearance and doubtful for sepsis, I think he can start with outpatient management and follow-up with his PMD and return with any new or worsening symptoms.         FINAL CLINICAL IMPRESSION(S) / ED DIAGNOSES   Final diagnoses:  Acute cystitis without hematuria  Pneumonia due to infectious organism, unspecified laterality, unspecified part of lung     Rx / DC Orders   ED Discharge Orders          Ordered    levofloxacin (LEVAQUIN) 750 MG tablet  Daily        11/29/23 2341             Note:  This document was prepared using Dragon voice recognition software and may include unintentional dictation errors.    Buell Carmin, MD 11/29/23 [phone]

## 2023-11-30 ENCOUNTER — Telehealth: Payer: Self-pay

## 2023-11-30 NOTE — Telephone Encounter (Signed)
 Okay with me

## 2023-11-30 NOTE — Telephone Encounter (Signed)
 Copied from CRM [id number]. Topic: Appointments - Transfer of Care >> Nov 29, 2023  4:25 PM Aisha D wrote: Pt is requesting to transfer FROM: Dr.Denny Arabella Knife  Pt is requesting to transfer TO: [facility], [employer] Reason for requested transfer: Looking for new PCP It is the responsibility of the team the patient would like to transfer to (Dr. Deborra Falter) to reach out to the patient if for any reason this transfer is not acceptable.

## 2023-12-02 LAB — URINE CULTURE: Culture: >=100000 — AB

## 2024-02-18 ENCOUNTER — Emergency Department
Admission: EM | Admit: 2024-02-18 | Discharge: 2024-02-18 | Disposition: A | Discharge status: Home / Self Care | Attending: Emergency Medicine | Admitting: Emergency Medicine

## 2024-02-18 DIAGNOSIS — I509 Heart failure, unspecified: Secondary | ICD-10-CM | POA: Insufficient documentation

## 2024-02-18 DIAGNOSIS — I2510 Atherosclerotic heart disease of native coronary artery without angina pectoris: Secondary | ICD-10-CM | POA: Insufficient documentation

## 2024-02-18 DIAGNOSIS — N390 Urinary tract infection, site not specified: Secondary | ICD-10-CM | POA: Insufficient documentation

## 2024-02-18 DIAGNOSIS — I110 Hypertensive heart disease with heart failure: Secondary | ICD-10-CM | POA: Diagnosis not present

## 2024-02-18 DIAGNOSIS — R5383 Other fatigue: Secondary | ICD-10-CM | POA: Insufficient documentation

## 2024-02-18 DIAGNOSIS — D72829 Elevated white blood cell count, unspecified: Secondary | ICD-10-CM | POA: Insufficient documentation

## 2024-02-18 DIAGNOSIS — R531 Weakness: Secondary | ICD-10-CM | POA: Insufficient documentation

## 2024-02-18 DIAGNOSIS — R350 Frequency of micturition: Secondary | ICD-10-CM | POA: Diagnosis present

## 2024-02-18 LAB — COMPREHENSIVE METABOLIC PANEL WITH GFR
ALT: 21 U/L (ref 0–44)
AST: 19 U/L (ref 15–41)
Albumin: 3.7 g/dL (ref 3.5–5.0)
Alkaline Phosphatase: 76 U/L (ref 38–126)
Anion gap: 8 (ref 5–15)
BUN: 14 mg/dL (ref 8–23)
CO2: 22 mmol/L (ref 22–32)
Calcium: 8.6 mg/dL — ABNORMAL LOW (ref 8.9–10.3)
Chloride: 105 mmol/L (ref 98–111)
Creatinine, Ser: 1.11 mg/dL (ref 0.61–1.24)
GFR, Estimated: >60 mL/min (ref >60)
Glucose, Bld: 126 mg/dL — ABNORMAL HIGH (ref 70–99)
Potassium: 3.8 mmol/L (ref 3.5–5.1)
Sodium: 135 mmol/L (ref 135–145)
Total Bilirubin: 0.9 mg/dL (ref 0.0–1.2)
Total Protein: 7.2 g/dL (ref 6.5–8.1)

## 2024-02-18 LAB — RESP PANEL BY RT-PCR (RSV, FLU A&B, COVID)  RVPGX2
Influenza A by PCR: NEGATIVE
Influenza B by PCR: NEGATIVE
Resp Syncytial Virus by PCR: NEGATIVE
SARS Coronavirus 2 by RT PCR: NEGATIVE

## 2024-02-18 LAB — CBC
HCT: 44 % (ref 39.0–52.0)
Hemoglobin: 13.2 g/dL (ref 13.0–17.0)
MCH: 26.8 pg (ref 26.0–34.0)
MCHC: 30 g/dL (ref 30.0–36.0)
MCV: 89.4 fL (ref 80.0–100.0)
Platelets: 176 K/uL (ref 150–400)
RBC: 4.92 MIL/uL (ref 4.22–5.81)
RDW: 15.1 % (ref 11.5–15.5)
WBC: 17.2 K/uL — ABNORMAL HIGH (ref 4.0–10.5)
nRBC: 0 % (ref 0.0–0.2)

## 2024-02-18 LAB — URINALYSIS, ROUTINE W REFLEX MICROSCOPIC
Bacteria, UA: NONE SEEN
Bilirubin Urine: NEGATIVE
Glucose, UA: NEGATIVE mg/dL
Hgb urine dipstick: NEGATIVE
Ketones, ur: NEGATIVE mg/dL
Nitrite: NEGATIVE
Protein, ur: 100 mg/dL — AB
Specific Gravity, Urine: 1.018 (ref 1.005–1.030)
pH: 6 (ref 5.0–8.0)

## 2024-02-18 LAB — TROPONIN I (HIGH SENSITIVITY): Troponin I (High Sensitivity): 9 ng/L (ref <18)

## 2024-02-18 LAB — LIPASE, BLOOD: Lipase: 32 U/L (ref 11–51)

## 2024-02-18 MED ORDER — CEPHALEXIN 500 MG PO CAPS: 500.0000 mg | ORAL_CAPSULE | Freq: Two times a day (BID) | ORAL | 0 refills | Status: DC

## 2024-02-18 MED ORDER — CEPHALEXIN 500 MG PO CAPS
500.0000 mg | ORAL_CAPSULE | Freq: Once | ORAL | Status: AC
  Administered 2024-02-18: 500 mg via ORAL
  Filled 2024-02-18: qty 1

## 2024-02-18 MED ORDER — SODIUM CHLORIDE 0.9 % IV BOLUS
500.0000 mL | Freq: Once | INTRAVENOUS | Status: AC
  Administered 2024-02-18: 500 mL via INTRAVENOUS

## 2024-02-18 NOTE — ED Triage Notes (Signed)
 Pt presents to the ED via POV from home with urinary frequency, urgency, chills, weakness, and dizziness since yesterday. Pt A&Ox4 at time of triage. VSS

## 2024-02-18 NOTE — Discharge Instructions (Addendum)
 As we discussed your white blood cell count is elevated today as it was a couple months ago.  Please call the number provided for hematology to arrange a follow-up appointment for further evaluation as this may need more of a workup to rule out any other concerning findings.  Please take your antibiotic as prescribed for its entire course.  Return to the emergency department for any worsening weakness or any other symptom personally concerning to yourself.

## 2024-02-18 NOTE — ED Provider Notes (Signed)
 [facility] Provider Note    Event Date/Time   First MD Initiated Contact with Patient 02/18/24 1811     (approximate)  History   Chief Complaint: Weakness  HPI  Dale Cook. is a 78 y.o. male with a past medical history of CHF, CAD, hypertension, gastric reflux, presents to the emergency department with urinary frequency and chills and generalized weakness.  According to the patient since yesterday he has been feeling generalized fatigue and weakness he has noted urinary frequency and urgency.  States he urinates and then feels like he needs to urinate minutes later.  He has had chills but no measured fever.  Patient states he has had a bladder infection in the past and this feels similar.  Physical Exam   Triage Vital Signs: ED Triage Vitals  Encounter Vitals Group     BP 02/18/24 1323 (!) 144/72     Girls Systolic BP Percentile --      Girls Diastolic BP Percentile --      Boys Systolic BP Percentile --      Boys Diastolic BP Percentile --      Pulse Rate 02/18/24 1323 74     Resp 02/18/24 1323 18     Temp 02/18/24 1323 98.4 F (36.9 C)     Temp src --      SpO2 02/18/24 1323 98 %     Weight 02/18/24 1326 232 lb (105.2 kg)     Height 02/18/24 1326 6' (1.829 m)     Head Circumference --      Peak Flow --      Pain Score 02/18/24 1326 0     Pain Loc --      Pain Education --      Exclude from Growth Chart --     Most recent vital signs: Vitals:   02/18/24 1323  BP: (!) 144/72  Pulse: 74  Resp: 18  Temp: 98.4 F (36.9 C)  SpO2: 98%    General: Awake, no distress.  CV:  Good peripheral perfusion.  Regular rate and rhythm  Resp:  Normal effort.  Equal breath sounds bilaterally.  Abd:  No distention.  Soft, nontender.  No rebound or guarding.  Benign abdomen.  ED Results / Procedures / Treatments   EKG  EKG viewed and interpreted by myself shows a normal sinus rhythm at 71 bpm with a narrow QRS, normal axis, normal  intervals, no concerning ST changes.  MEDICATIONS ORDERED IN ED: Medications  sodium chloride  0.9 % bolus 500 mL (500 mLs Intravenous New Bag/Given 02/18/24 1840)     IMPRESSION / MDM / ASSESSMENT AND PLAN / ED COURSE  I reviewed the triage vital signs and the nursing notes.  Patient's presentation is most consistent with acute presentation with potential threat to life or bodily function.  Patient presents to the emergency department for generalized fatigue weakness since yesterday along with urinary urgency and frequency.  Patient's lab work today shows a CBC with a leukocytosis of 17,000, chemistry shows no concerning findings.  Lipase is normal LFTs are normal.  I have added on a troponin as a precaution urinalysis is pending.  Will IV hydrate while awaiting urinalysis and troponin results.  Patient agreeable to plan of care.  Patient's workup does show 11-20 white cells but no bacteria in the urine.  Will send urine culture as a precaution.  The remainder of the workup is reassuring with a normal chemistry normal lipase negative respiratory  panel negative troponin.  Patient does have a mild leukocytosis but largely unchanged from last check a month ago.  Given the patient's continued elevated leukocytosis will refer to hematology as the patient may need further workup.  Given his 11-20 white blood cells in the urine we will treat with an antibiotic and send a urine culture.  Patient agreeable to plan of care.  FINAL CLINICAL IMPRESSION(S) / ED DIAGNOSES   Weakness Urinary tract infection  Note:  This document was prepared using Dragon voice recognition software and may include unintentional dictation errors.   Dorothyann Drivers, MD 02/18/24 2215

## 2024-02-20 LAB — URINE CULTURE

## 2024-03-05 ENCOUNTER — Emergency Department

## 2024-03-05 ENCOUNTER — Other Ambulatory Visit: Payer: Self-pay

## 2024-03-05 ENCOUNTER — Emergency Department
Admission: EM | Admit: 2024-03-05 | Discharge: 2024-03-05 | Disposition: A | Discharge status: Home / Self Care | Attending: Emergency Medicine | Admitting: Emergency Medicine

## 2024-03-05 DIAGNOSIS — M7989 Other specified soft tissue disorders: Secondary | ICD-10-CM | POA: Diagnosis present

## 2024-03-05 DIAGNOSIS — M25531 Pain in right wrist: Secondary | ICD-10-CM | POA: Diagnosis not present

## 2024-03-05 DIAGNOSIS — I110 Hypertensive heart disease with heart failure: Secondary | ICD-10-CM | POA: Diagnosis not present

## 2024-03-05 DIAGNOSIS — I5030 Unspecified diastolic (congestive) heart failure: Secondary | ICD-10-CM | POA: Diagnosis not present

## 2024-03-05 DIAGNOSIS — I2510 Atherosclerotic heart disease of native coronary artery without angina pectoris: Secondary | ICD-10-CM | POA: Diagnosis not present

## 2024-03-05 LAB — CBC WITH DIFFERENTIAL/PLATELET
Abs Immature Granulocytes: 0.05 K/uL (ref 0.00–0.07)
Basophils Absolute: 0 K/uL (ref 0.0–0.1)
Basophils Relative: 0 %
Eosinophils Absolute: 0.1 K/uL (ref 0.0–0.5)
Eosinophils Relative: 1 %
HCT: 43.8 % (ref 39.0–52.0)
Hemoglobin: 13.8 g/dL (ref 13.0–17.0)
Immature Granulocytes: 0 %
Lymphocytes Relative: 9 %
Lymphs Abs: 1.1 K/uL (ref 0.7–4.0)
MCH: 26.3 pg (ref 26.0–34.0)
MCHC: 31.5 g/dL (ref 30.0–36.0)
MCV: 83.6 fL (ref 80.0–100.0)
Monocytes Absolute: 2.2 K/uL — ABNORMAL HIGH (ref 0.1–1.0)
Monocytes Relative: 17 %
Neutro Abs: 9.6 K/uL — ABNORMAL HIGH (ref 1.7–7.7)
Neutrophils Relative %: 73 %
Platelets: 264 K/uL (ref 150–400)
RBC: 5.24 MIL/uL (ref 4.22–5.81)
RDW: 14.7 % (ref 11.5–15.5)
WBC: 13.1 K/uL — ABNORMAL HIGH (ref 4.0–10.5)
nRBC: 0 % (ref 0.0–0.2)

## 2024-03-05 LAB — BASIC METABOLIC PANEL WITH GFR
Anion gap: 9 (ref 5–15)
BUN: 15 mg/dL (ref 8–23)
CO2: 26 mmol/L (ref 22–32)
Calcium: 8.8 mg/dL — ABNORMAL LOW (ref 8.9–10.3)
Chloride: 98 mmol/L (ref 98–111)
Creatinine, Ser: 1.18 mg/dL (ref 0.61–1.24)
GFR, Estimated: >60 mL/min (ref >60)
Glucose, Bld: 161 mg/dL — ABNORMAL HIGH (ref 70–99)
Potassium: 4.9 mmol/L (ref 3.5–5.1)
Sodium: 133 mmol/L — ABNORMAL LOW (ref 135–145)

## 2024-03-05 MED ORDER — OXYCODONE HCL 5 MG PO TABS
5.0000 mg | ORAL_TABLET | Freq: Once | ORAL | Status: AC
  Administered 2024-03-05: 5 mg via ORAL
  Filled 2024-03-05: qty 1

## 2024-03-05 MED ORDER — IBUPROFEN 800 MG PO TABS
800.0000 mg | ORAL_TABLET | Freq: Once | ORAL | Status: AC
  Administered 2024-03-05: 800 mg via ORAL
  Filled 2024-03-05: qty 1

## 2024-03-05 MED ORDER — OXYCODONE HCL 5 MG PO TABS: 5.0000 mg | ORAL_TABLET | Freq: Three times a day (TID) | ORAL | 0 refills | Status: DC | PRN

## 2024-03-05 MED ORDER — ACETAMINOPHEN 500 MG PO TABS
1000.0000 mg | ORAL_TABLET | Freq: Once | ORAL | Status: AC
  Administered 2024-03-05: 1000 mg via ORAL
  Filled 2024-03-05: qty 2

## 2024-03-05 MED ORDER — IBUPROFEN 800 MG PO TABS: 800.0000 mg | ORAL_TABLET | Freq: Three times a day (TID) | ORAL | 0 refills | Status: AC | PRN

## 2024-03-05 NOTE — Discharge Instructions (Addendum)
 Please go see your primary care doctor this week or early next week to get your right arm reassessed while on the pain meds.  If you note worsening pain, swelling, if you notice reduced range of motion to your wrist, any redness, fever, please return to the emergency department to be seen.  Please reserve the oxycodone  for severe breakthrough pain.  Otherwise take the ibuprofen  800 mg 3 times a day as prescribed.

## 2024-03-05 NOTE — ED Triage Notes (Signed)
 Pt to ED for R hand and arm swelling and pain since 3 days. Hand and forearm are visibly swollen. Pt think it may be his arthritis. No known injury. No redness noted.

## 2024-03-05 NOTE — ED Provider Notes (Addendum)
 [name] Provider Note    Event Date/Time   First MD Initiated Contact with Patient 03/05/24 [phone]     (approximate)   History   Arm Swelling and Arm Pain   HPI  Dale Cook. is a 78 y.o. male with history of heart failure, CAD, hypertension, gout, presenting with arm swelling.  States that he noticed his right hand, wrist and proximal forearm was swollen 3 days ago.  No redness, no trauma or falls.  No fever, no numbness or weakness.  Pain to those areas.  Still able to range his wrist, but has some associated pain.  No fevers.  Has had gout to his ankle and knees in the past but not in his wrist previously.  Does state that he follows with the orthopedic surgeon here.  He said cherry juice has helped with gout in the past, he tried cherry juice last couple days without any relief.  Denies recent infections.  On independent review, he was seen by cardiology in April, has history of CAD status post PCI, history of heart failure with preserved ejection fraction.     Physical Exam   Triage Vital Signs: ED Triage Vitals  Encounter Vitals Group     BP 03/05/24 0753 135/74     Girls Systolic BP Percentile --      Girls Diastolic BP Percentile --      Boys Systolic BP Percentile --      Boys Diastolic BP Percentile --      Pulse Rate 03/05/24 0753 82     Resp 03/05/24 0753 16     Temp 03/05/24 0753 98.1 F (36.7 C)     Temp Source 03/05/24 0753 Oral     SpO2 03/05/24 0753 97 %     Weight 03/05/24 0752 233 lb (105.7 kg)     Height 03/05/24 0752 6' (1.829 m)     Head Circumference --      Peak Flow --      Pain Score 03/05/24 0750 10     Pain Loc --      Pain Education --      Exclude from Growth Chart --     Most recent vital signs: Vitals:   03/05/24 0753  BP: 135/74  Pulse: 82  Resp: 16  Temp: 98.1 F (36.7 C)  SpO2: 97%     General: Awake, no distress.  CV:  Good peripheral perfusion.  Resp:  Normal effort.  No tachypnea  or respiratory distress Abd:  No distention.  Other:  Swelling noted from right dorsal hand to proximal forearm.  He does have tenderness to his right wrist, able to range his wrist without limitations.  There is no erythema, induration, fluctuance.  There is no open wounds.  He has equal radial pulses, grip strength and sensation is intact.  Able to range his shoulder and elbow.   ED Results / Procedures / Treatments   Labs (all labs ordered are listed, but only abnormal results are displayed) Labs Reviewed  CBC WITH DIFFERENTIAL/PLATELET - Abnormal; Notable for the following components:      Result Value   WBC 13.1 (*)    Neutro Abs 9.6 (*)    Monocytes Absolute 2.2 (*)    All other components within normal limits  BASIC METABOLIC PANEL WITH GFR - Abnormal; Notable for the following components:   Sodium 133 (*)    Glucose, Bld 161 (*)    Calcium  8.8 (*)  All other components within normal limits     RADIOLOGY On my independent interpretation, ultrasound without obvious DVT   PROCEDURES:  Critical Care performed: No  Procedures   MEDICATIONS ORDERED IN ED: Medications  acetaminophen  (TYLENOL ) tablet 1,000 mg (1,000 mg Oral Given 03/05/24 0841)  oxyCODONE  (Oxy IR/ROXICODONE ) immediate release tablet 5 mg (5 mg Oral Given 03/05/24 0841)  ibuprofen  (ADVIL ) tablet 800 mg (800 mg Oral Given 03/05/24 0841)     IMPRESSION / MDM / ASSESSMENT AND PLAN / ED COURSE  I reviewed the triage vital signs and the nursing notes.                              Differential diagnosis includes, but is not limited to, DVT, lymphedema, CHF, arthritis, gout, considered septic arthritis but patient has no overlying redness, able to range his wrists, no recent infections, no fever, no wounds or trauma.  Doubt fracture at this location given lack of trauma.  Labs were obtained out of triage.  Get a DVT ultrasound for his right upper extremity.  Give meds for pain as well as to treat possible  gout.  Patient's presentation is most consistent with acute presentation with potential threat to life or bodily function.  Independent interpretation of labs and imaging below.  Considered but no indication for inpatient admission at this time, he safe for outpatient management.  Given prescription for ibuprofen  3 times daily that he can take for gout.  Will also give him a short prescription for oxycodone  for severe breakthrough pain.  Instructed him to reserve that pain medication and not to take if he is driving or operating heavy machinery.  Otherwise strict return precautions given.  Instructed follow-up with his primary care doctor this week or early next week to get his arm reassessed.  Shared decision making done with patient and he is agreeable with this plan.  Discharge.  3:09 PM: Patient asking for different medication other than ibuprofen , will send some prednisone  to his pharmacy.  Clinical Course as of 03/05/24 1110  Tue Mar 05, 2024  0825 Independent review of labs, mild leukocytosis, electrolytes not severely deranged.  Creatinine is not overtly elevated [TT]  1105 US  Venous Img Upper Right (DVT Study) Negative for deep venous thrombosis within the right arm.  [TT]    Clinical Course User Index [TT] Waymond Lorelle Cummins, MD     FINAL CLINICAL IMPRESSION(S) / ED DIAGNOSES   Final diagnoses:  Right wrist pain  Hand swelling  Arm swelling     Rx / DC Orders   ED Discharge Orders          Ordered    oxyCODONE  (ROXICODONE ) 5 MG immediate release tablet  Every 8 hours PRN        03/05/24 1109    ibuprofen  (ADVIL ) 800 MG tablet  Every 8 hours PRN        03/05/24 1109             Note:  This document was prepared using Dragon voice recognition software and may include unintentional dictation errors.    Waymond Lorelle Cummins, MD 03/05/24 1110    Waymond Lorelle Cummins, MD 03/05/24 [phone]

## 2024-03-07 ENCOUNTER — Other Ambulatory Visit: Payer: Self-pay

## 2024-03-08 MED ORDER — ATORVASTATIN CALCIUM 40 MG PO TABS: 40.0000 mg | ORAL_TABLET | Freq: Every day | ORAL | 3 refills | Status: DC

## 2024-03-13 ENCOUNTER — Other Ambulatory Visit: Payer: Self-pay | Admitting: Cardiovascular Disease

## 2024-03-17 ENCOUNTER — Other Ambulatory Visit: Payer: Self-pay

## 2024-03-17 ENCOUNTER — Emergency Department
Admission: EM | Admit: 2024-03-17 | Discharge: 2024-03-17 | Disposition: A | Discharge status: Home / Self Care | Attending: Emergency Medicine | Admitting: Emergency Medicine

## 2024-03-17 ENCOUNTER — Emergency Department

## 2024-03-17 DIAGNOSIS — I2510 Atherosclerotic heart disease of native coronary artery without angina pectoris: Secondary | ICD-10-CM | POA: Insufficient documentation

## 2024-03-17 DIAGNOSIS — D72829 Elevated white blood cell count, unspecified: Secondary | ICD-10-CM | POA: Diagnosis not present

## 2024-03-17 DIAGNOSIS — I110 Hypertensive heart disease with heart failure: Secondary | ICD-10-CM | POA: Insufficient documentation

## 2024-03-17 DIAGNOSIS — I509 Heart failure, unspecified: Secondary | ICD-10-CM | POA: Diagnosis not present

## 2024-03-17 DIAGNOSIS — M109 Gout, unspecified: Secondary | ICD-10-CM

## 2024-03-17 DIAGNOSIS — M10031 Idiopathic gout, right wrist: Secondary | ICD-10-CM | POA: Diagnosis not present

## 2024-03-17 DIAGNOSIS — M25531 Pain in right wrist: Secondary | ICD-10-CM

## 2024-03-17 LAB — CBC WITH DIFFERENTIAL/PLATELET
Abs Immature Granulocytes: 0.08 K/uL — ABNORMAL HIGH (ref 0.00–0.07)
Basophils Absolute: 0 K/uL (ref 0.0–0.1)
Basophils Relative: 0 %
Eosinophils Absolute: 0.3 K/uL (ref 0.0–0.5)
Eosinophils Relative: 2 %
HCT: 43.9 % (ref 39.0–52.0)
Hemoglobin: 13.8 g/dL (ref 13.0–17.0)
Immature Granulocytes: 1 %
Lymphocytes Relative: 8 %
Lymphs Abs: 1 K/uL (ref 0.7–4.0)
MCH: 26.2 pg (ref 26.0–34.0)
MCHC: 31.4 g/dL (ref 30.0–36.0)
MCV: 83.3 fL (ref 80.0–100.0)
Monocytes Absolute: 1.5 K/uL — ABNORMAL HIGH (ref 0.1–1.0)
Monocytes Relative: 13 %
Neutro Abs: 9.1 K/uL — ABNORMAL HIGH (ref 1.7–7.7)
Neutrophils Relative %: 76 %
Platelets: 220 K/uL (ref 150–400)
RBC: 5.27 MIL/uL (ref 4.22–5.81)
RDW: 15.3 % (ref 11.5–15.5)
WBC: 11.9 K/uL — ABNORMAL HIGH (ref 4.0–10.5)
nRBC: 0 % (ref 0.0–0.2)

## 2024-03-17 LAB — BASIC METABOLIC PANEL WITH GFR
Anion gap: 8 (ref 5–15)
BUN: 14 mg/dL (ref 8–23)
CO2: 25 mmol/L (ref 22–32)
Calcium: 8.5 mg/dL — ABNORMAL LOW (ref 8.9–10.3)
Chloride: 99 mmol/L (ref 98–111)
Creatinine, Ser: 1.1 mg/dL (ref 0.61–1.24)
GFR, Estimated: >60 mL/min (ref >60)
Glucose, Bld: 139 mg/dL — ABNORMAL HIGH (ref 70–99)
Potassium: 4.5 mmol/L (ref 3.5–5.1)
Sodium: 132 mmol/L — ABNORMAL LOW (ref 135–145)

## 2024-03-17 LAB — SEDIMENTATION RATE: Sed Rate: 47 mm/h — ABNORMAL HIGH (ref 0–20)

## 2024-03-17 LAB — C-REACTIVE PROTEIN: CRP: 6.8 mg/dL — ABNORMAL HIGH (ref <1.0)

## 2024-03-17 MED ORDER — PREDNISONE 20 MG PO TABS
60.0000 mg | ORAL_TABLET | Freq: Once | ORAL | Status: AC
Start: 2024-03-17 — End: 2024-03-17
  Administered 2024-03-17: 60 mg via ORAL
  Filled 2024-03-17: qty 3

## 2024-03-17 MED ORDER — COLCHICINE 0.6 MG PO TABS: 0.6000 mg | ORAL_TABLET | Freq: Every day | ORAL | 0 refills | Status: DC

## 2024-03-17 MED ORDER — OXYCODONE HCL 5 MG PO TABS: 5.0000 mg | ORAL_TABLET | Freq: Four times a day (QID) | ORAL | 0 refills | Status: AC | PRN

## 2024-03-17 MED ORDER — COLCHICINE 0.6 MG PO TABS
0.6000 mg | ORAL_TABLET | Freq: Once | ORAL | Status: AC
  Administered 2024-03-17: 0.6 mg via ORAL
  Filled 2024-03-17 (×2): qty 1

## 2024-03-17 MED ORDER — PREDNISONE 20 MG PO TABS: 20.0000 mg | ORAL_TABLET | Freq: Two times a day (BID) | ORAL | 0 refills | Status: AC

## 2024-03-17 MED ORDER — NAPROXEN 500 MG PO TABS
500.0000 mg | ORAL_TABLET | Freq: Once | ORAL | Status: AC
  Administered 2024-03-17: 500 mg via ORAL
  Filled 2024-03-17: qty 1

## 2024-03-17 MED ORDER — NAPROXEN 500 MG PO TABS: 500.0000 mg | ORAL_TABLET | Freq: Two times a day (BID) | ORAL | 0 refills | Status: AC

## 2024-03-17 NOTE — ED Provider Notes (Signed)
 [facility] Provider Note    Event Date/Time   First MD Initiated Contact with Patient 03/17/24 [phone]     (approximate)   History   Wrist Pain   HPI  Dale Cook. is a 78 y.o. male with past medical history of CHF, CAD, hypertension, gout who presents to the emergency department with right hand and wrist pain.  Patient states that he was seen at our facility on 9/9 and was diagnosed with gout.  He was prescribed oxycodone  and ibuprofen  and had initial resolve of symptoms however his pain has returned over the past week.  Denies any fevers skin changes or pain in any other extremity.  He reports that his primary care physician has retired and his next PCP appointment is December.  He has not followed up with orthopedics.  This is his only issue today      Physical Exam   Triage Vital Signs: ED Triage Vitals  Encounter Vitals Group     BP 03/17/24 0531 133/78     Girls Systolic BP Percentile --      Girls Diastolic BP Percentile --      Boys Systolic BP Percentile --      Boys Diastolic BP Percentile --      Pulse Rate 03/17/24 0531 86     Resp 03/17/24 0531 20     Temp 03/17/24 0531 97.8 F (36.6 C)     Temp src --      SpO2 03/17/24 0531 97 %     Weight --      Height --      Head Circumference --      Peak Flow --      Pain Score 03/17/24 0529 9     Pain Loc --      Pain Education --      Exclude from Growth Chart --     Most recent vital signs: Vitals:   03/17/24 0531 03/17/24 0730  BP: 133/78   Pulse: 86   Resp: 20   Temp: 97.8 F (36.6 C)   SpO2: 97% 97%    Nursing Triage Note reviewed. Vital signs reviewed and patients oxygen saturation is normoxic  General: Patient is well nourished, well developed, awake and alert, resting comfortably in no acute distress Head: Normocephalic and atraumatic Eyes: Normal inspection, extraocular muscles intact, no conjunctival pallor Ear, nose, throat: Normal external exam Neck:  Normal range of motion Respiratory: Patient is in no respiratory distress, lungs CTAB Cardiovascular: Patient is not tachycardic, RRR without murmur appreciated GI: Abd SNT with no guarding or rebound  Back: Normal inspection of the back with good strength and range of motion throughout all ext Extremities: pulses intact with good cap refills, no LE pitting edema or calf tenderness  Patient has full range of motion of right wrist however is tender over his distal ulna or carpal bone.  There is no erythema full sensation full flexion extension of fingers Neuro: The patient is alert and oriented to person, place, and time, appropriately conversive, with 5/5 bilat UE/LE strength, no gross motor or sensory defects noted. Coordination appears to be adequate. Skin: Warm, dry, and intact Psych: normal mood and affect, no SI or HI  ED Results / Procedures / Treatments   Labs (all labs ordered are listed, but only abnormal results are displayed) Labs Reviewed  SEDIMENTATION RATE - Abnormal; Notable for the following components:      Result Value   Sed  Rate 47 (*)    All other components within normal limits  CBC WITH DIFFERENTIAL/PLATELET - Abnormal; Notable for the following components:   WBC 11.9 (*)    Neutro Abs 9.1 (*)    Monocytes Absolute 1.5 (*)    Abs Immature Granulocytes 0.08 (*)    All other components within normal limits  BASIC METABOLIC PANEL WITH GFR - Abnormal; Notable for the following components:   Sodium 132 (*)    Glucose, Bld 139 (*)    Calcium  8.5 (*)    All other components within normal limits  C-REACTIVE PROTEIN     EKG None  RADIOLOGY Xray right wrist: Evidence of soft tissue swelling did degenerative changes but no tophus or fracture    PROCEDURES:  Critical Care performed: No  Procedures   MEDICATIONS ORDERED IN ED: Medications  predniSONE  (DELTASONE ) tablet 60 mg (60 mg Oral Given 03/17/24 0848)  naproxen  (NAPROSYN ) tablet 500 mg (500 mg Oral  Given 03/17/24 0848)  colchicine  tablet 0.6 mg (0.6 mg Oral Given 03/17/24 0931)     IMPRESSION / MDM / ASSESSMENT AND PLAN / ED COURSE                                Differential diagnosis includes, but is not limited to, gout flare, osteoarthritis, rheumatoid arthritis, fracture tophus, acute renal insufficiency   ED course: Upon patient arrival I reviewed the ED provider note from 03/06/19/2025.  He had a Doppler ultrasound at that time which was negative and I do not think his symptoms are consistent with DVT.  I do not think his symptoms today are consistent with septic arthritis as he has full range of motion no pain with range of motion of the wrist no erythema or fevers.  I am more concerned about the possibility of a rheumatoid arthritis, osteoarthritis or gout.  Patient does have a prior history of gout and says that the prior interventions did seem to work for a limited amount of time.  Patient had improvement in symptoms with a dose of prednisone , naproxen  and colchicine  help.  I have initiated him at the low dose colchicine .  I have advised him to follow-up with your primary care physician as soon as possible (worsen to the care coordinators to help coordinate) or even orthopedics with possible rheumatology.  He voiced understanding.  We reviewed return precautions and he voiced understanding and requested discharge  At time of discharge there is no evidence of acute life, limb, vision, or fertility threat. Patient has stable vital signs, pain is well controlled, patient is ambulatory and p.o. tolerant.  Discharge instructions were completed using the EPIC system. I would refer you to those at this time. All warnings prescriptions follow-up etc. were discussed in detail with the patient. Patient indicates understanding and is agreeable with this plan. All questions answered.  Patient is made aware that they may return to the emergency department for any worsening or new condition or for  any other emergency.    Clinical Course as of 03/17/24 1044  Sun Mar 17, 2024  0837 WBC(!): 11.9 Slight leukocytosis [HD]  0839 Creatinine: 1.10 No acute renal insufficiency GFR is greater than 60 [HD]  0909 Sed Rate(!): 47 slightly elevated but not significantly I suspect this is secondary to gout [HD]  0912 Patient feels improved, feels comfortable returning home.  He will follow-up with orthopedics and I will put a referral in for  primary care referral as his older PCP has retired.  Will send him with scripts for oxycodone  naproxen  prednisone  and colchicine .  Advised him to get a repeat kidney check in 2 weeks.  All questions answered and patient voiced understanding and requested discharge [HD]    Clinical Course User Index [HD] Nicholaus Rolland BRAVO, MD   -- Risk: 5 This patient has a high risk of morbidity due to further diagnostic testing or treatment. Rationale: This patient's evaluation and management involve a high risk of morbidity due to the potential severity of presenting symptoms, need for diagnostic testing, and/or initiation of treatment that may require close monitoring. The differential includes conditions with potential for significant deterioration or requiring escalation of care. Treatment decisions in the ED, including medication administration, procedural interventions, or disposition planning, reflect this level of risk. COPA: 5 The patient has the following acute or chronic illness/injury that poses a possible threat to life or bodily function: [X] : The patient has a potentially serious acute condition or an acute exacerbation of a chronic illness requiring urgent evaluation and management in the Emergency Department. The clinical presentation necessitates immediate consideration of life-threatening or function-threatening diagnoses, even if they are ultimately ruled out.   FINAL CLINICAL IMPRESSION(S) / ED DIAGNOSES   Final diagnoses:  Right wrist pain  Acute gout of  right wrist, unspecified cause     Rx / DC Orders   ED Discharge Orders          Ordered    predniSONE  (DELTASONE ) 20 MG tablet  2 times daily with meals        03/17/24 0916    colchicine  0.6 MG tablet  Daily        03/17/24 0916    naproxen  (NAPROSYN ) 500 MG tablet  2 times daily with meals        03/17/24 0916    Ambulatory Referral to Primary Care (Establish Care)        03/17/24 0916    oxyCODONE  (ROXICODONE ) 5 MG immediate release tablet  Every 6 hours PRN        03/17/24 9078             Note:  This document was prepared using Dragon voice recognition software and may include unintentional dictation errors.   Nicholaus Rolland BRAVO, MD 03/17/24 1046

## 2024-03-17 NOTE — Discharge Instructions (Addendum)
 You were seen in the emergency department for right wrist pain which I suspect is largely secondary to gout.  Be mindful if you develop any worsening erythema or fevers as this could speak to an infection however at this time I do not think your presentation is consistent with such.  1) I have ordered you colchicine  for an acute gout flare.  This is a low-dose however it can be associated with some side effects.  Please do not take this at the same time as your Carver Dyal.  Please space this out at least 2 hours after taking your COVID dial as there can be drug reactions.   2) please follow-up with both orthopedics and your primary care physician as soon as possible  3) please have your kidney function rechecked in 1 to 2 weeks  RETURN PRECAUTIONS & AFTERCARE: (ENGLISH) RETURN PRECAUTIONS: Return immediately to the emergency department or see/call your doctor if you feel worse, weak or have changes in speech or vision, are short of breath, have fever, vomiting, pain, bleeding or dark stool, trouble urinating or any new issues. Return here or see/call your doctor if not improving as expected for your suspected condition. FOLLOW-UP CARE: Call your doctor and/or any doctors we referred you to for more advice and to make an appointment. Do this today, tomorrow or after the weekend. Some doctors only take PPO insurance so if you have HMO insurance you may want to contact your HMO or your regular doctor for referral to a specialist within your plan. Either way tell the doctor's office that it was a referral from the emergency department so you get the soonest possible appointment.  YOUR TEST RESULTS: Take result reports of any blood or urine tests, imaging tests and EKG's to your doctor and any referral doctor. Have any abnormal tests repeated. Your doctor or a referral doctor can let you know when this should be done. Also make sure your doctor contacts this hospital to get any test results that are not  currently available such as cultures or special tests for infection and final imaging reports, which are often not available at the time you leave the ER but which may list additional important findings that are not documented on the preliminary report. BLOOD PRESSURE: If your blood pressure was greater than 120/80 have your blood pressure rechecked within 1 to 2 weeks. MEDICATION SIDE EFFECTS: Do not drive, walk, bike, take the bus, etc. if you have received or are being prescribed any sedating medications such as those for pain or anxiety or certain antihistamines like Benadryl . If you have been give one of these here get a taxi home or have a friend drive you home. Ask your pharmacist to counsel you on potential side effects of any new medication

## 2024-03-17 NOTE — ED Notes (Signed)
 Patient transported to X-ray

## 2024-03-17 NOTE — ED Triage Notes (Addendum)
 Right wrist swelling and joint pain, says he came in about 2 weeks for the same and it got better, started swelling again on Friday. States he took all the prednisone  and oxycodone  that was prescribed, cannot take the ibuprofen  he was prescribed. No new trauma/injury

## 2024-03-22 ENCOUNTER — Other Ambulatory Visit: Payer: Self-pay | Admitting: Cardiovascular Disease

## 2024-04-30 ENCOUNTER — Encounter: Payer: Self-pay | Admitting: Physician Assistant

## 2024-04-30 ENCOUNTER — Ambulatory Visit: Attending: Physician Assistant | Admitting: Physician Assistant

## 2024-04-30 VITALS — BP 120/70 | HR 64 | Ht 72.0 in | Wt 237.4 lb

## 2024-04-30 DIAGNOSIS — E785 Hyperlipidemia, unspecified: Secondary | ICD-10-CM | POA: Diagnosis not present

## 2024-04-30 DIAGNOSIS — I10 Essential (primary) hypertension: Secondary | ICD-10-CM

## 2024-04-30 DIAGNOSIS — I5032 Chronic diastolic (congestive) heart failure: Secondary | ICD-10-CM | POA: Diagnosis not present

## 2024-04-30 DIAGNOSIS — I25810 Atherosclerosis of coronary artery bypass graft(s) without angina pectoris: Secondary | ICD-10-CM | POA: Diagnosis not present

## 2024-04-30 NOTE — Patient Instructions (Signed)
 Medication Instructions:  Your physician recommends that you continue on your current medications as directed. Please refer to the Current Medication list given to you today.   *If you need a refill on your cardiac medications before your next appointment, please call your pharmacy*  Lab Work: None ordered at this time   Follow-Up: At [facility], you and your health needs are our priority.  As part of our continuing mission to provide you with exceptional heart care, our providers are all part of one team.  This team includes your primary Cardiologist (physician) and Advanced Practice Providers or APPs (Physician Assistants and Nurse Practitioners) who all work together to provide you with the care you need, when you need it.  Your next appointment:   6 month(s)  Provider:   You may see Deatrice Cage, MD or Bernardino Bring, PA-C  We recommend signing up for the patient portal called MyChart.  Sign up information is provided on this After Visit Summary.  MyChart is used to connect with patients for Virtual Visits (Telemedicine).  Patients are able to view lab/test results, encounter notes, upcoming appointments, etc.  Non-urgent messages can be sent to your provider as well.   To learn more about what you can do with MyChart, go to [email or web address].

## 2024-04-30 NOTE — Progress Notes (Signed)
 Cardiology Office Note    Date:  04/30/2024   ID:  Dale Ill Jden Want., DOB 1946/04/06, MRN [id number]  PCP:  Corlis Honor BROCKS, MD (Inactive)  Cardiologist:  Deatrice Cage, MD  Electrophysiologist:  None   Chief Complaint: Follow-up  History of Present Illness:   Dale Ingrum. is a 78 y.o. male with history of CAD status post CABG in 12/2016 s/p PCI/DES to the LCx on 08/11/2021, HFpEF, HTN, and HLD who presents for follow up of CAD and HFpEF.    Dale Cook presented in 2018 with unstable angina with LHC showing severe three-vessel CAD.  Dale Cook subsequently underwent four-vessel CABG in 12/2016 with LIMA to mid LAD, SVG to distal LAD, jump graft with SVG to OM1 and OM2.  Carotid artery ultrasound prior to surgery showed no significant disease.  Echo showed normal LV systolic function with no significant valvular abnormalities.  Lexiscan  MPI on 07/13/2021 showed a small in size, moderate in severity, partially reversible defect involving the apical inferior and mid inferolateral segments most consistent with scar and peri-infarct ischemia.  Compared to prior study in 2020, the mid/apical inferior, inferolateral defect was new. LVEF normal. Due to persistent symptoms, Dale Cook underwent LHC on 07/26/2021, that showed significant underlying 3-vessel CAD with patent LIMA to mid LAD, and occluded SVG to distal LAD and SVG to OM1/OM2. The RCA was not injected and known to be a small nondominant vessel and occluded in the mid segment.  There was normal LVSF and mildly elevated LVEDP. The LAD was significantly diseased after the LIMA anastomosis and likely not suitable for PCI given the extension of disease to the apical LAD. The LCx could be treated with PCI, but at the bifurcation lesion would require a long segment stenting with recommendation for staged PCI at [facility].     Dale Cook was admitted to [facility] in 07/2021 with dyspnea concerning for anginal equivalent along with volume depletion. Imdur  was added and  ultimately, the decision was made to move forward with planned LHC with Dr. Cage at [facility] the following week.  Dale Cook underwent planned repeat LHC at [facility] on 08/11/2021 with successful IVUS-guide PCI/DES to the LCx using 1 long stent (2.5x48 mm). There was significant vessel size mismatch between the distal and proximal area with the distal segment 2.5 mm in diameter and the proximal area 3.5 mm in diameter. The stent was post-dilated to the maximal stent ID, 3.5 mm. The OM2 had borderline disease proximally, though was supplied by good flow from the Y graft from OM3.    Dale Cook was last seen in the office in 09/2023 and was doing well from a cardiac perspective, without symptoms of angina or cardiac decompensation.  Due to a murmur noted on exam Dale Cook underwent echo in 10/2023 that showed an EF of 60 to 65%, no regional wall motion abnormalities, mild LVH, grade 1 diastolic dysfunction, low normal RV systolic function with normal ventricular cavity size, mild aortic insufficiency, and aortic valve sclerosis without evidence of stenosis.  Since Dale Cook was last seen in the office Dale Cook has been evaluated in the ER multiple times for varying complaints including generalized weakness, acute cystitis, and right wrist pain felt to be due to osteoarthritis and gout.  During these evaluations Dale Cook has had negative high-sensitivity troponins.  Dale Cook comes in doing well from a cardiac perspective and is without symptoms of angina or cardiac decompensation.  Gout has resolved with discontinuation of [employer].  No falls, dizziness, presyncope, or syncope.  No symptoms of hematochezia or melena.  Weight stable.  No significant lower extremity swelling or progressive orthopnea.  Stays active at baseline without cardiac limitation.  Does not have any acute cardiac concerns at this time.   Labs independently reviewed: 02/2024 - potassium 4.5, BUN 14, serum creatinine 1.1, Hgb 13.8, PLT 220 01/2024 - albumin  8.7, AST/ALT normal 09/2023 -  TC 108, TG 83, HDL 34, LDL 57 01/2023 - TSH normal  Past Medical History:  Diagnosis Date   (HFpEF) heart failure with preserved ejection fraction (HCC)    a.) TTE 01/11/2017: EF 65-70%; b.) TTE 10/15/2020: EF 60-65%. mild LAE, mild MR/AR, G1DD   Anginal pain    Aortic atherosclerosis    CAD (coronary artery disease)    a.) LHC 01/09/17: LAD 95p, 35m, 60/90d, LCx 80p/m, 171m, OM1 24m, OM2 90, RCA 99p; b.) 12/2016 CABG x 4 (LIMA-LAD, VG-dLAD, VG-OM1-OM2); c.) 11/2018 MV: EF 66%, no isch; d.) LHC/PCI 07/26/21: 100 LPAV, 90 p-mLCx (2.5 x 28 mm Synergy XD DES), 85 p-mLAD, 70 OM1, 80 m-dLAD, 50 dLAD; LIMA-LAD pat, SVG-dLAD and SVG-OM1/2 occ; e.) MV 07/13/2021: EF 60-65%, sm mod-sev part rev apical inf and mid inflat defect   Cardiac murmur    a.) grade II/VI early peaking systolic in aortic area   CHF (congestive heart failure) (HCC)    Essential hypertension    GERD (gastroesophageal reflux disease)    High cholesterol    Left carotid bruit    Osteoarthritis    Wears dentures    partial upper    Past Surgical History:  Procedure Laterality Date   CATARACT EXTRACTION W/PHACO Left 06/12/2020   Procedure: CATARACT EXTRACTION PHACO AND INTRAOCULAR LENS PLACEMENT (IOC) LEFT 4.36 00:51.9 8.4%;  Surgeon: Mittie Gaskin, MD;  Location: [facility];  Service: Ophthalmology;  Laterality: Left;   CATARACT EXTRACTION W/PHACO Right 11/30/2022   Procedure: CATARACT EXTRACTION PHACO AND INTRAOCULAR LENS PLACEMENT (IOC) RIGHT;  Surgeon: Mittie Gaskin, MD;  Location: [facility];  Service: Ophthalmology;  Laterality: Right;  4.74 0:33.4   COLONOSCOPY WITH PROPOFOL  N/A 11/09/2016   Procedure: COLONOSCOPY WITH PROPOFOL ;  Surgeon: Dessa Reyes ORN, MD;  Location: [facility];  Service: Endoscopy;  Laterality: N/A;   COLONOSCOPY WITH PROPOFOL  N/A 11/29/2019   Procedure: COLONOSCOPY WITH PROPOFOL ;  Surgeon: Dessa Reyes ORN, MD;  Location: [facility];  Service: Endoscopy;   Laterality: N/A;   CORONARY ARTERY BYPASS GRAFT N/A 01/12/2017   Procedure: CORONARY ARTERY BYPASS GRAFTING x 4 (LIMA to mLAD, SVG to dLAD, sequential SVG to OM1-OM2, EVH via left thigh and leg); Surgeon: Dusty Sudie DEL, MD;  Location: [facility] OR;  Service: Open Heart Surgery;  Laterality: N/A;   CORONARY STENT INTERVENTION N/A 08/11/2021   Procedure: CORONARY STENT INTERVENTION;  Surgeon: Darron Deatrice LABOR, MD;  Location: [facility];  Service: Cardiovascular;  Laterality: N/A;   CORONARY ULTRASOUND/IVUS N/A 08/11/2021   Procedure: Intravascular Ultrasound/IVUS;  Surgeon: Darron Deatrice LABOR, MD;  Location: [facility];  Service: Cardiovascular;  Laterality: N/A;   ECTROPION REPAIR Bilateral 06/01/2022   INTRAOPERATIVE TRANSESOPHAGEAL ECHOCARDIOGRAM N/A 01/12/2017   Procedure: INTRAOPERATIVE TRANSESOPHAGEAL ECHOCARDIOGRAM;  Surgeon: Dusty Sudie DEL, MD;  Location: [facility] OR;  Service: Open Heart Surgery;  Laterality: N/A;   LEFT HEART CATH AND CORONARY ANGIOGRAPHY Left 01/09/2017   Procedure: Left Heart Cath and Coronary Angiography;  Surgeon: Darron Deatrice LABOR, MD;  Location: [facility];  Service: Cardiovascular;  Laterality: Left;   LEFT HEART CATH AND CORS/GRAFTS  ANGIOGRAPHY N/A 07/26/2021   Procedure: LEFT HEART CATH AND CORS/GRAFTS ANGIOGRAPHY;  Surgeon: Darron Deatrice LABOR, MD;  Location: [facility];  Service: Cardiovascular;  Laterality: N/A;   TOTAL KNEE ARTHROPLASTY Right 01/18/2021   Procedure: TOTAL KNEE ARTHROPLASTY;  Surgeon: Leora Lynwood SAUNDERS, MD;  Location: [facility];  Service: Orthopedics;  Laterality: Right;   TOTAL KNEE ARTHROPLASTY Left 07/19/2022   Procedure: TOTAL KNEE ARTHROPLASTY;  Surgeon: Leora Lynwood SAUNDERS, MD;  Location: [facility];  Service: Orthopedics;  Laterality: Left;    Current Medications: Current Meds  Medication Sig   acetaminophen  (TYLENOL ) 325 MG tablet Take 2 tablets (650 mg total) by mouth every 4 (four) hours as needed for headache or  mild pain.   aspirin  81 MG chewable tablet Chew 81 mg by mouth daily.   atorvastatin  (LIPITOR) 40 MG tablet Take 1 tablet (40 mg total) by mouth daily.   carvedilol  (COREG ) 25 MG tablet TAKE 1 TABLET TWICE DAILY   clopidogrel  (PLAVIX ) 75 MG tablet TAKE 1 TABLET BY MOUTH EVERY DAY   furosemide  (LASIX ) 20 MG tablet Take 1 tablet (20 mg total) by mouth every other day. Mon - Friday only.   isosorbide  mononitrate (IMDUR ) 30 MG 24 hr tablet Take 1 tablet (30 mg total) by mouth daily.   loratadine  (CLARITIN ) 10 MG tablet Take 10 mg by mouth daily as needed for allergies. PRN   methocarbamol  (ROBAXIN ) 500 MG tablet Take 1 tablet by mouth every 8 (eight) hours as needed. PRN   Multiple Vitamin (MULTIVITAMIN) capsule Take 1 capsule by mouth daily.   pantoprazole  (PROTONIX ) 40 MG tablet TAKE 1 TABLET EVERY DAY   sacubitril -valsartan  (ENTRESTO ) 24-26 MG Take 1 tablet by mouth 2 (two) times daily.    Allergies:   Patient has no known allergies.   Social History   Socioeconomic History   Marital status: Divorced    Spouse name: Not on file   Number of children: 3   Years of education: Not on file   Highest education level: Not on file  Occupational History   Not on file  Tobacco Use   Smoking status: Never   Smokeless tobacco: Never  Vaping Use   Vaping status: Never Used  Substance and Sexual Activity   Alcohol  use: No   Drug use: No   Sexual activity: Not Currently  Other Topics Concern   Not on file  Social History Narrative   Not on file   Social Drivers of Health   Financial Resource Strain: Not on file  Food Insecurity: No Food Insecurity (01/29/2023)   Hunger Vital Sign    Worried About Running Out of Food in the Last Year: Never true    Ran Out of Food in the Last Year: Never true  Transportation Needs: No Transportation Needs (01/29/2023)   PRAPARE - Administrator, Civil Service (Medical): No    Lack of Transportation (Non-Medical): No  Physical Activity:  Sufficiently Active (12/23/2020)   Exercise Vital Sign    Days of Exercise per Week: 7 days    Minutes of Exercise per Session: 120 min  Stress: Not on file  Social Connections: Not on file     Family History:  The patient's family history includes Colon cancer in his brother; Heart attack in his mother.  ROS:   12-point review of systems is negative unless otherwise noted in the HPI.   EKGs/Labs/Other Studies Reviewed:    Studies reviewed were summarized above. The additional studies were  reviewed today:  2D echo 11/23/2023: 1. Left ventricular ejection fraction, by estimation, is 60 to 65%. The  left ventricle has normal function. The left ventricle has no regional  wall motion abnormalities. There is mild left ventricular hypertrophy.  Left ventricular diastolic parameters  are consistent with Grade I diastolic dysfunction (impaired relaxation).   2. Right ventricular systolic function is low normal. The right  ventricular size is normal.   3. The mitral valve is normal in structure. No evidence of mitral valve  regurgitation.   4. The aortic valve is tricuspid. Aortic valve regurgitation is mild.  Aortic valve sclerosis/calcification is present, without any evidence of  aortic stenosis.  __________  LHC 08/11/2021:   LPAV lesion is 100% stenosed.   Prox LAD to Mid LAD lesion is 95% stenosed.   Mid Cx to Dist Cx lesion is 95% stenosed.   2nd Mrg lesion is 60% stenosed.   A drug-eluting stent was successfully placed using a SYNERGY XD 2.50X48.   Post intervention, there is a 0% residual stenosis.   Successful IVUS guided angioplasty and drug-eluting stent placement to the left circumflex using 1 long stent.  Significant vessel size mismatch between the distal and the proximal area.  Distal segment was 2.5 mm in diameter in the proximal area was 3.5 mm in diameter.  The stent was postdilated distally with a 2.75 noncompliant balloon and proximally to 3.5 noncompliant balloon  to the maximal stent ID which is 3.5 mm. OM2 has borderline disease proximally but is supplied by good flow from the Y graft from OM 3.   Recommendations: Continue dual antiplatelet therapy for at least 6 months. Aggressive treatment of risk factors. Given underlying chronic kidney disease, will hydrate overnight and recheck renal function in the morning. __________   [facility] 07/26/2021:   LPAV lesion is 100% stenosed.   Prox Cx to Mid Cx lesion is 90% stenosed.   Prox LAD to Mid LAD lesion is 95% stenosed.   1st Mrg lesion is 70% stenosed.   Origin to Prox Graft lesion is 100% stenosed.   Prox RCA to Mid RCA lesion is 100% stenosed.   Origin to Prox Graft lesion is 100% stenosed.   Mid LAD to Dist LAD lesion is 80% stenosed.   Dist LAD lesion is 60% stenosed.   SVG graft was visualized by angiography.   LIMA and is normal in caliber.   The graft exhibits no disease.   The left ventricular systolic function is normal.   LV end diastolic pressure is mildly elevated.   The left ventricular ejection fraction is 55-65% by visual estimate.   1.  Significant underlying three-vessel coronary artery disease with patent LIMA to mid LAD and occluded SVG to distal LAD and SVG to OM1/OM 2.  RCA was not injected and known to be small nondominant and occluded in the midsegment. 2.  Normal LV systolic function mildly elevated left ventricular end-diastolic pressure.   Recommendations: Unfortunately, the LAD is significantly diseased after the LIMA anastomosis and likely not suitable for PCI given extension of disease to the apical LAD.  Left circumflex can be treated with PCI but at the bifurcation lesion will long segment of disease.  We will place the patient on clopidogrel  and planned staged PCI at [facility]. __________   Lexiscan  MPI 06/2021:   Abnormal, probably low risk pharmacologic myocardial perfusion stress test.   There is a small in size, moderate to severe, partially reversible defect  involving the apical inferior  and mid inferolateral segments most consistent with scar and peri-infarct ischemia, though an element of artifact cannot be excluded.   Left ventricular function is normal (LVEF 60-65%).   Attenuation correction CT demonstrates post CABG findings, coronary artery calcification, and aortic atherosclerosis.   Compared to the prior study of 12/07/2018, mid/apical inferior/inferolateral defect is new. __________   2D echo 09/2020: 1. Left ventricular ejection fraction, by estimation, is 60 to 65%. The  left ventricle has normal function. The left ventricle has no regional  wall motion abnormalities. Left ventricular diastolic parameters are  consistent with Grade I diastolic  dysfunction (impaired relaxation).   2. Right ventricular systolic function is normal. The right ventricular  size is normal. There is normal pulmonary artery systolic pressure. The  estimated right ventricular systolic pressure is 27.6 mmHg.   3. Left atrial size was mildly dilated.   4. The mitral valve is normal in structure. Mild mitral valve  regurgitation.   5. The aortic valve is normal in structure. Aortic valve regurgitation is  mild.  __________   Lexiscan  MPI 11/2018: Pharmacological myocardial perfusion imaging study with no significant  ischemia Normal wall motion, EF estimated at 66% No EKG changes concerning for ischemia at peak stress or in recovery. Nonspecific T wave ABN on resting EKG V3 to V6 Low risk scan   EKG:  EKG is ordered today.  The EKG ordered today demonstrates NSR, 64 bpm, first-degree AV block, rare PVC, prior inferior infarct, nonspecific ST-T changes  Recent Labs: 02/18/2024: ALT 21 03/17/2024: BUN 14; Creatinine, Ser 1.10; Hemoglobin 13.8; Platelets 220; Potassium 4.5; Sodium 132  Recent Lipid Panel    Component Value Date/Time   CHOL 108 10/18/2023 0902   TRIG 83 10/18/2023 0902   HDL 34 (L) 10/18/2023 0902   CHOLHDL 3.2 10/18/2023 0902   CHOLHDL  4.7 05/04/2021 0401   VLDL 11 05/04/2021 0401   LDLCALC 57 10/18/2023 0902    PHYSICAL EXAM:    VS:  BP 120/70 (BP Location: Left Arm, Patient Position: Sitting, Cuff Size: Normal)   Pulse 64 Comment: 60 oximeter  Ht 6' (1.829 m)   Wt 237 lb 6.4 oz (107.7 kg)   SpO2 97%   BMI 32.20 kg/m   BMI: Body mass index is 32.2 kg/m.  Physical Exam Vitals reviewed.  Constitutional:      Appearance: Dale Cook is well-developed.  HENT:     Head: Normocephalic and atraumatic.  Eyes:     General:        Right eye: No discharge.        Left eye: No discharge.  Cardiovascular:     Rate and Rhythm: Normal rate and regular rhythm.     Pulses:          Posterior tibial pulses are 2+ on the right side and 2+ on the left side.     Heart sounds: S1 normal and S2 normal. Heart sounds not distant. No midsystolic click and no opening snap. Murmur heard.     Systolic murmur is present with a grade of 1/6 at the upper left sternal border.     No friction rub.  Pulmonary:     Effort: Pulmonary effort is normal. No respiratory distress.     Breath sounds: Normal breath sounds. No decreased breath sounds, wheezing, rhonchi or rales.  Musculoskeletal:     Cervical back: Normal range of motion.     Right lower leg: No edema.     Left lower leg: No edema.  Skin:    General: Skin is warm and dry.     Nails: There is no clubbing.  Neurological:     Mental Status: Dale Cook is alert and oriented to person, place, and time.  Psychiatric:        Speech: Speech normal.        Behavior: Behavior normal.        Thought Content: Thought content normal.        Judgment: Judgment normal.     Wt Readings from Last 3 Encounters:  04/30/24 237 lb 6.4 oz (107.7 kg)  03/05/24 233 lb (105.7 kg)  02/18/24 232 lb (105.2 kg)     ASSESSMENT & PLAN:   CAD status post CABG status post PCI without angina: Dale Cook comes in today and continues to do very well, without symptoms of angina or cardiac decompensation.  Dale Cook remains on DAPT  with aspirin  81 mg and clopidogrel  75 mg long-term, as long as tolerated, given prior CABG with subsequent PCI.  Continue aggressive risk factor modification and secondary prevention including carvedilol  25 mg twice daily, Imdur  30 mg, and atorvastatin  40 mg.  No indication for further ischemic testing at this time.  HFpEF: Euvolemic, well compensated with NYHA class II symptoms.  Remains on furosemide  20 mg Monday through Friday along with Entresto  24/26 mg twice daily.  Defer addition of MRA with prior AKI and SGLT2 inhibitor with prior UTI.  HTN: Blood pressure is well-controlled in the office today.  Dale Cook remains on carvedilol  25 mg twice daily, Imdur  30 mg daily, and Entresto  24/26 mg twice daily.  HLD: LDL 57 in 09/2023 with normal AST/ALT in 01/2024.  Remains on atorvastatin  40 mg.    Disposition: F/u with Dr. Darron or an APP in 6 months.   Medication Adjustments/Labs and Tests Ordered: Current medicines are reviewed at length with the patient today.  Concerns regarding medicines are outlined above. Medication changes, Labs and Tests ordered today are summarized above and listed in the Patient Instructions accessible in Encounters.   Signed, Bernardino Bring, PA-C 04/30/2024 1:31 PM      [facility] [place] [address] [place], [place] [address] [phone]

## 2024-05-23 ENCOUNTER — Other Ambulatory Visit: Payer: Self-pay | Admitting: Physician Assistant

## 2024-05-29 ENCOUNTER — Telehealth: Payer: Self-pay | Admitting: Cardiovascular Disease

## 2024-05-29 ENCOUNTER — Other Ambulatory Visit: Payer: Self-pay | Admitting: Cardiovascular Disease

## 2024-05-29 MED ORDER — ATORVASTATIN CALCIUM 40 MG PO TABS: 40.0000 mg | ORAL_TABLET | Freq: Every day | ORAL | 2 refills | Status: AC

## 2024-05-29 NOTE — Telephone Encounter (Signed)
*  STAT* If patient is at the pharmacy, call can be transferred to refill team.   1. Which medications need to be refilled? (please list name of each medication and dose if known)  atorvastatin  (LIPITOR) 40 MG tablet    2. Which pharmacy/location (including street and city if local pharmacy) is medication to be sent to?  [facility] - [place], [place] - [address]      3. Do they need a 30 day or 90 day supply? 90 day    Pt is out of medication

## 2024-05-29 NOTE — Telephone Encounter (Signed)
 Requested Prescriptions   Signed Prescriptions Disp Refills   atorvastatin  (LIPITOR) 40 MG tablet 90 tablet 2    Sig: Take 1 tablet (40 mg total) by mouth daily.    Authorizing Provider: DARRON GRASS A    Ordering User: [email or web address], Jaheem Hedgepath  C

## 2024-06-08 ENCOUNTER — Other Ambulatory Visit: Payer: Self-pay | Admitting: Cardiovascular Disease

## 2024-06-13 ENCOUNTER — Ambulatory Visit: Admitting: Nurse Practitioner

## 2024-07-23 ENCOUNTER — Encounter: Payer: Self-pay | Admitting: Nurse Practitioner

## 2024-07-23 ENCOUNTER — Ambulatory Visit: Admitting: Nurse Practitioner

## 2024-07-23 VITALS — BP 138/80 | HR 72 | Temp 97.9°F | Ht 72.0 in | Wt 236.2 lb

## 2024-07-23 DIAGNOSIS — Z7984 Long term (current) use of oral hypoglycemic drugs: Secondary | ICD-10-CM

## 2024-07-23 DIAGNOSIS — Z1159 Encounter for screening for other viral diseases: Secondary | ICD-10-CM

## 2024-07-23 DIAGNOSIS — I25810 Atherosclerosis of coronary artery bypass graft(s) without angina pectoris: Secondary | ICD-10-CM

## 2024-07-23 DIAGNOSIS — E559 Vitamin D deficiency, unspecified: Secondary | ICD-10-CM

## 2024-07-23 DIAGNOSIS — E119 Type 2 diabetes mellitus without complications: Secondary | ICD-10-CM

## 2024-07-23 DIAGNOSIS — E785 Hyperlipidemia, unspecified: Secondary | ICD-10-CM | POA: Diagnosis not present

## 2024-07-23 DIAGNOSIS — I10 Essential (primary) hypertension: Secondary | ICD-10-CM

## 2024-07-23 DIAGNOSIS — R7303 Prediabetes: Secondary | ICD-10-CM | POA: Insufficient documentation

## 2024-07-23 DIAGNOSIS — Z23 Encounter for immunization: Secondary | ICD-10-CM | POA: Diagnosis not present

## 2024-07-23 DIAGNOSIS — I5032 Chronic diastolic (congestive) heart failure: Secondary | ICD-10-CM

## 2024-07-23 NOTE — Progress Notes (Unsigned)
 "  New Patient Office Visit  Subjective   Patient ID: Dale Cazarez., male    DOB: 07/11/1945  Age: 79 y.o. MRN: [id number]  CC:  Chief Complaint  Patient presents with   Establish Care   Discussed the use of AI scribe software for clinical note transcription with the patient, who gave verbal consent to proceed.  History of Present Illness   Dale Mcburney. is a 79 year old male who presents to establish care after his previous PCP Dr. Honor Cork, retired.  He has a history of CAD status post CABG in 12/2016 s/p PCI/DES to the LCx on 08/11/2021, heart failure with preserved ejection fraction, Hypertension, hyperlipidemia, osteoarthritis and diabetes.  He has no current concerns or pain. He has diabetes and was prescribed metformin , which he did not take regularly. He is open to resuming it if needed.   He is followed by cardiology.  Last appointment with cardiology on 04/30/2024.  Patient states that Imdur  is used occasionally for elevated blood pressure, about once every two to three months.  He has a history of knee surgery and no longer takes methocarbamol . He occasionally uses Tylenol  but finds it ineffective. He experienced a gout flare in November affecting his hand and has arthritis in his feet, which worsens with weather changes. He remains active and independent.  His family history includes heart issues in his mother and diabetes in both parents. He has a half-brother with colon cancer. 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 He has three children, two nearby, and two grandchildren. He does not smoke or consume alcohol . He reports normal bowel movements. Foot and    Health Maintenance  Topic Date Due   Medicare Annual Wellness (AWV)  Never done   Hepatitis C Screening  Never done   DTaP/Tdap/Td (6 - Tdap) 08/08/1972   Pneumococcal Vaccine: 50+ Years (2 of 2 - PPSV23, PCV20, or PCV21) 05/16/2017   Zoster Vaccines-  Shingrix (2 of 2) 05/16/2023   COVID-19 Vaccine (9 - Mixed Product risk 2025-26 season) 12/24/2024   Influenza Vaccine  Completed   Meningococcal B Vaccine  Aged Out   Colonoscopy  Discontinued    There are no preventive care reminders to display for this patient.  Outpatient Encounter Medications as of 07/23/2024  Medication Sig   acetaminophen  (TYLENOL ) 325 MG tablet Take 2 tablets (650 mg total) by mouth every 4 (four) hours as needed for headache or mild pain.   aspirin  81 MG chewable tablet Chew 81 mg by mouth daily.   atorvastatin  (LIPITOR) 40 MG tablet Take 1 tablet (40 mg total) by mouth daily.   carvedilol  (COREG ) 25 MG tablet TAKE 1 TABLET TWICE DAILY   clopidogrel  (PLAVIX ) 75 MG tablet TAKE 1 TABLET BY MOUTH EVERY DAY   furosemide  (LASIX ) 20 MG tablet Take 1 tablet (20 mg total) by mouth every other day. Mon - Friday only.   isosorbide  mononitrate (IMDUR ) 30 MG 24 hr tablet Take 1 tablet (30 mg total) by mouth daily.   loratadine  (CLARITIN ) 10 MG tablet Take 10 mg by mouth daily as needed for allergies. PRN   methocarbamol  (ROBAXIN ) 500 MG tablet Take 1 tablet by mouth every 8 (eight) hours as needed. PRN   Multiple Vitamin (MULTIVITAMIN) capsule Take 1 capsule by mouth daily.   pantoprazole  (PROTONIX ) 40 MG tablet TAKE 1 TABLET EVERY DAY   sacubitril -valsartan  (ENTRESTO ) 24-26 MG Take 1 tablet by mouth 2 (two)  times daily.   No facility-administered encounter medications on file as of 07/23/2024.    Past Medical History:  Diagnosis Date   (HFpEF) heart failure with preserved ejection fraction (HCC)    a.) TTE 01/11/2017: EF 65-70%; b.) TTE 10/15/2020: EF 60-65%. mild LAE, mild MR/AR, G1DD   Anginal pain    Aortic atherosclerosis    CAD (coronary artery disease)    a.) LHC 01/09/17: LAD 95p, 17m, 60/90d, LCx 80p/m, 164m, OM1 89m, OM2 90, RCA 99p; b.) 12/2016 CABG x 4 (LIMA-LAD, VG-dLAD, VG-OM1-OM2); c.) 11/2018 MV: EF 66%, no isch; d.) LHC/PCI 07/26/21: 100 LPAV, 90 p-mLCx (2.5  x 28 mm Synergy XD DES), 85 p-mLAD, 70 OM1, 80 m-dLAD, 50 dLAD; LIMA-LAD pat, SVG-dLAD and SVG-OM1/2 occ; e.) MV 07/13/2021: EF 60-65%, sm mod-sev part rev apical inf and mid inflat defect   Cardiac murmur    a.) grade II/VI early peaking systolic in aortic area   CHF (congestive heart failure) (HCC)    Essential hypertension    GERD (gastroesophageal reflux disease)    High cholesterol    Left carotid bruit    Osteoarthritis    Wears dentures    partial upper    Past Surgical History:  Procedure Laterality Date   CATARACT EXTRACTION W/PHACO Left 06/12/2020   Procedure: CATARACT EXTRACTION PHACO AND INTRAOCULAR LENS PLACEMENT (IOC) LEFT 4.36 00:51.9 8.4%;  Surgeon: Mittie Gaskin, MD;  Location: [facility];  Service: Ophthalmology;  Laterality: Left;   CATARACT EXTRACTION W/PHACO Right 11/30/2022   Procedure: CATARACT EXTRACTION PHACO AND INTRAOCULAR LENS PLACEMENT (IOC) RIGHT;  Surgeon: Mittie Gaskin, MD;  Location: [facility];  Service: Ophthalmology;  Laterality: Right;  4.74 0:33.4   COLONOSCOPY WITH PROPOFOL  N/A 11/09/2016   Procedure: COLONOSCOPY WITH PROPOFOL ;  Surgeon: Dessa Reyes ORN, MD;  Location: [facility];  Service: Endoscopy;  Laterality: N/A;   COLONOSCOPY WITH PROPOFOL  N/A 11/29/2019   Procedure: COLONOSCOPY WITH PROPOFOL ;  Surgeon: Dessa Reyes ORN, MD;  Location: [facility];  Service: Endoscopy;  Laterality: N/A;   CORONARY ARTERY BYPASS GRAFT N/A 01/12/2017   Procedure: CORONARY ARTERY BYPASS GRAFTING x 4 (LIMA to mLAD, SVG to dLAD, sequential SVG to OM1-OM2, EVH via left thigh and leg); Surgeon: Dusty Sudie DEL, MD;  Location: [facility] OR;  Service: Open Heart Surgery;  Laterality: N/A;   CORONARY STENT INTERVENTION N/A 08/11/2021   Procedure: CORONARY STENT INTERVENTION;  Surgeon: Darron Deatrice LABOR, MD;  Location: [facility];  Service: Cardiovascular;  Laterality: N/A;   CORONARY ULTRASOUND/IVUS N/A 08/11/2021   Procedure:  Intravascular Ultrasound/IVUS;  Surgeon: Darron Deatrice LABOR, MD;  Location: [facility];  Service: Cardiovascular;  Laterality: N/A;   ECTROPION REPAIR Bilateral 06/01/2022   INTRAOPERATIVE TRANSESOPHAGEAL ECHOCARDIOGRAM N/A 01/12/2017   Procedure: INTRAOPERATIVE TRANSESOPHAGEAL ECHOCARDIOGRAM;  Surgeon: Dusty Sudie DEL, MD;  Location: [facility];  Service: Open Heart Surgery;  Laterality: N/A;   LEFT HEART CATH AND CORONARY ANGIOGRAPHY Left 01/09/2017   Procedure: Left Heart Cath and Coronary Angiography;  Surgeon: Darron Deatrice LABOR, MD;  Location: [facility];  Service: Cardiovascular;  Laterality: Left;   LEFT HEART CATH AND CORS/GRAFTS ANGIOGRAPHY N/A 07/26/2021   Procedure: LEFT HEART CATH AND CORS/GRAFTS ANGIOGRAPHY;  Surgeon: Darron Deatrice LABOR, MD;  Location: [facility];  Service: Cardiovascular;  Laterality: N/A;   TOTAL KNEE ARTHROPLASTY Right 01/18/2021   Procedure: TOTAL KNEE ARTHROPLASTY;  Surgeon: Leora Lynwood SAUNDERS, MD;  Location: [facility];  Service: Orthopedics;  Laterality: Right;  TOTAL KNEE ARTHROPLASTY Left 07/19/2022   Procedure: TOTAL KNEE ARTHROPLASTY;  Surgeon: Leora Lynwood SAUNDERS, MD;  Location: [facility];  Service: Orthopedics;  Laterality: Left;    Family History  Problem Relation Age of Onset   Heart attack Mother    Colon cancer Brother     Social History   Socioeconomic History   Marital status: Divorced    Spouse name: Not on file   Number of children: 3   Years of education: Not on file   Highest education level: Not on file  Occupational History   Not on file  Tobacco Use   Smoking status: Never   Smokeless tobacco: Never  Vaping Use   Vaping status: Never Used  Substance and Sexual Activity   Alcohol  use: No   Drug use: No   Sexual activity: Not Currently  Other Topics Concern   Not on file  Social History Narrative   Not on file   Social Drivers of Health   Tobacco Use: Low Risk (07/23/2024)   Patient History    Smoking  Tobacco Use: Never    Smokeless Tobacco Use: Never    Passive Exposure: Not on file  Financial Resource Strain: Not on file  Food Insecurity: No Food Insecurity (01/29/2023)   Hunger Vital Sign    Worried About Running Out of Food in the Last Year: Never true    Ran Out of Food in the Last Year: Never true  Transportation Needs: No Transportation Needs (01/29/2023)   PRAPARE - Administrator, Civil Service (Medical): No    Lack of Transportation (Non-Medical): No  Physical Activity: Not on file  Stress: Not on file  Social Connections: Not on file  Intimate Partner Violence: Not At Risk (01/29/2023)   Humiliation, Afraid, Rape, and Kick questionnaire    Fear of Current or Ex-Partner: No    Emotionally Abused: No    Physically Abused: No    Sexually Abused: No  Depression (PHQ2-9): Low Risk (04/05/2022)   Depression (PHQ2-9)    PHQ-2 Score: 0  Alcohol  Screen: Not on file  Housing: Low Risk (01/29/2023)   Housing    Last Housing Risk Score: 0  Utilities: Not At Risk (01/29/2023)   [facility] Utilities    Threatened with loss of utilities: No  Health Literacy: Not on file    ROS Negative unless indicated in HPI.      Objective    BP 138/80   Pulse 72   Temp 97.9 F (36.6 C)   Ht 6' (1.829 m)   Wt 236 lb 3.2 oz (107.1 kg)   SpO2 98%   BMI 32.03 kg/m   Physical Exam  {Labs (Optional):23779}    Assessment & Plan:  There are no diagnoses linked to this encounter.  No follow-ups on file.   Chelsea Aurora, NP  "

## 2024-07-24 ENCOUNTER — Ambulatory Visit: Payer: Self-pay | Admitting: Nurse Practitioner

## 2024-07-24 DIAGNOSIS — E119 Type 2 diabetes mellitus without complications: Secondary | ICD-10-CM

## 2024-07-24 LAB — COMPREHENSIVE METABOLIC PANEL WITH GFR
ALT: 10 U/L (ref 3–53)
AST: 12 U/L (ref 5–37)
Albumin: 4 g/dL (ref 3.5–5.2)
Alkaline Phosphatase: 92 U/L (ref 39–117)
BUN: 14 mg/dL (ref 6–23)
CO2: 27 meq/L (ref 19–32)
Calcium: 9.2 mg/dL (ref 8.4–10.5)
Chloride: 103 meq/L (ref 96–112)
Creatinine, Ser: 1.22 mg/dL (ref 0.40–1.50)
GFR: 57.64 mL/min — ABNORMAL LOW
Glucose, Bld: 128 mg/dL — ABNORMAL HIGH (ref 70–99)
Potassium: 4.1 meq/L (ref 3.5–5.1)
Sodium: 138 meq/L (ref 135–145)
Total Bilirubin: 0.4 mg/dL (ref 0.2–1.2)
Total Protein: 7 g/dL (ref 6.0–8.3)

## 2024-07-24 LAB — CBC WITH DIFFERENTIAL/PLATELET
Basophils Absolute: 0.1 10*3/uL (ref 0.0–0.1)
Basophils Relative: 0.9 % (ref 0.0–3.0)
Eosinophils Absolute: 0.4 10*3/uL (ref 0.0–0.7)
Eosinophils Relative: 5.2 % — ABNORMAL HIGH (ref 0.0–5.0)
HCT: 43.4 % (ref 39.0–52.0)
Hemoglobin: 14.1 g/dL (ref 13.0–17.0)
Lymphocytes Relative: 17.8 % (ref 12.0–46.0)
Lymphs Abs: 1.3 10*3/uL (ref 0.7–4.0)
MCHC: 32.4 g/dL (ref 30.0–36.0)
MCV: 83.5 fl (ref 78.0–100.0)
Monocytes Absolute: 0.9 10*3/uL (ref 0.1–1.0)
Monocytes Relative: 12.4 % — ABNORMAL HIGH (ref 3.0–12.0)
Neutro Abs: 4.6 10*3/uL (ref 1.4–7.7)
Neutrophils Relative %: 63.7 % (ref 43.0–77.0)
Platelets: 203 10*3/uL (ref 150.0–400.0)
RBC: 5.2 Mil/uL (ref 4.22–5.81)
RDW: 14.7 % (ref 11.5–15.5)
WBC: 7.3 10*3/uL (ref 4.0–10.5)

## 2024-07-24 LAB — LIPID PANEL
Cholesterol: 96 mg/dL (ref 28–200)
HDL: 27.9 mg/dL — ABNORMAL LOW
LDL Cholesterol: 47 mg/dL (ref 10–99)
NonHDL: 67.72
Total CHOL/HDL Ratio: 3
Triglycerides: 102 mg/dL (ref 10.0–149.0)
VLDL: 20.4 mg/dL (ref 0.0–40.0)

## 2024-07-24 LAB — TSH: TSH: 1.92 u[IU]/mL (ref 0.35–5.50)

## 2024-07-24 LAB — HEMOGLOBIN A1C: Hgb A1c MFr Bld: 7 % — ABNORMAL HIGH (ref 4.6–6.5)

## 2024-07-24 LAB — VITAMIN D 25 HYDROXY (VIT D DEFICIENCY, FRACTURES): VITD: 28.76 ng/mL — ABNORMAL LOW (ref 30.00–100.00)

## 2024-07-24 LAB — HEPATITIS C ANTIBODY: Hepatitis C Ab: NONREACTIVE

## 2024-07-24 NOTE — Progress Notes (Signed)
 Please call and inform patient:  Blood work showed no sign of anemia. Electrolytes, liver function, and thyroid  normal. Kidneys are slow, would recommend proper hydration medications like ibuprofen  and naproxen . Hemoglobin A1c is in diabetic range.  Would recommend to restart metformin  500 mg with breakfast.  Will send metformin  500 mg once a day with breakfast patient is agreeable. Vitamin D low would recommend over-the-counter vitamin D supplement 2000 units daily.

## 2024-07-25 MED ORDER — METFORMIN HCL ER 500 MG PO TB24: 500.0000 mg | ORAL_TABLET | Freq: Every day | ORAL | 1 refills | Status: AC

## 2024-07-25 NOTE — Progress Notes (Signed)
 Please inform pt metformin  500 mg has been sent to the pharmacy.

## 2024-07-26 DIAGNOSIS — E119 Type 2 diabetes mellitus without complications: Secondary | ICD-10-CM | POA: Insufficient documentation

## 2024-07-26 DIAGNOSIS — I5032 Chronic diastolic (congestive) heart failure: Secondary | ICD-10-CM | POA: Insufficient documentation

## 2024-07-26 NOTE — Assessment & Plan Note (Signed)
 Patient declines chest pain or shortness of breath.  He is followed by cardiology. -On dual antiplatelet therapy with aspirin  81 mg and clopidogrel  75 mg. -Patient states not taking Imdur  regularly. Take it as needed last medication refill was 2023. -Continue Lipitor 40 m daily

## 2024-07-29 NOTE — Assessment & Plan Note (Signed)
 Previously managed with metformin , not taken regularly. Prefers reassessment after blood work. - Will check HgA1c - Will discuss potential need for metformin  based on results

## 2024-07-30 ENCOUNTER — Telehealth: Payer: Self-pay

## 2024-11-28 ENCOUNTER — Ambulatory Visit: Admitting: Nurse Practitioner
